# Patient Record
Sex: Female | Born: 1955 | Race: Black or African American | Hispanic: No | Marital: Single | Smoking: Former smoker
Health system: Southern US, Community
[De-identification: ages and names within clinical notes are randomized; demographics above are authoritative.]

## PROBLEM LIST (undated history)

## (undated) DIAGNOSIS — Z923 Personal history of irradiation: Secondary | ICD-10-CM

## (undated) DIAGNOSIS — H9190 Unspecified hearing loss, unspecified ear: Secondary | ICD-10-CM

## (undated) DIAGNOSIS — E785 Hyperlipidemia, unspecified: Secondary | ICD-10-CM

## (undated) DIAGNOSIS — IMO0001 Reserved for inherently not codable concepts without codable children: Secondary | ICD-10-CM

## (undated) DIAGNOSIS — M549 Dorsalgia, unspecified: Secondary | ICD-10-CM

## (undated) DIAGNOSIS — F329 Major depressive disorder, single episode, unspecified: Secondary | ICD-10-CM

## (undated) DIAGNOSIS — E119 Type 2 diabetes mellitus without complications: Secondary | ICD-10-CM

## (undated) DIAGNOSIS — F32A Depression, unspecified: Secondary | ICD-10-CM

## (undated) DIAGNOSIS — K219 Gastro-esophageal reflux disease without esophagitis: Secondary | ICD-10-CM

## (undated) DIAGNOSIS — M5136 Other intervertebral disc degeneration, lumbar region: Secondary | ICD-10-CM

## (undated) DIAGNOSIS — C259 Malignant neoplasm of pancreas, unspecified: Secondary | ICD-10-CM

## (undated) DIAGNOSIS — I639 Cerebral infarction, unspecified: Secondary | ICD-10-CM

## (undated) DIAGNOSIS — IMO0002 Reserved for concepts with insufficient information to code with codable children: Secondary | ICD-10-CM

## (undated) DIAGNOSIS — F419 Anxiety disorder, unspecified: Secondary | ICD-10-CM

## (undated) DIAGNOSIS — G8929 Other chronic pain: Secondary | ICD-10-CM

## (undated) DIAGNOSIS — M51369 Other intervertebral disc degeneration, lumbar region without mention of lumbar back pain or lower extremity pain: Secondary | ICD-10-CM

## (undated) DIAGNOSIS — I10 Essential (primary) hypertension: Secondary | ICD-10-CM

## (undated) DIAGNOSIS — D649 Anemia, unspecified: Secondary | ICD-10-CM

## (undated) DIAGNOSIS — K859 Acute pancreatitis without necrosis or infection, unspecified: Secondary | ICD-10-CM

## (undated) HISTORY — PX: TUBAL LIGATION: SHX77

## (undated) HISTORY — PX: BILE DUCT STENT PLACEMENT: SHX1227

## (undated) HISTORY — PX: APPENDECTOMY: SHX54

---

## 2013-02-05 ENCOUNTER — Encounter (HOSPITAL_COMMUNITY): Payer: Self-pay

## 2013-02-05 ENCOUNTER — Emergency Department (HOSPITAL_COMMUNITY)
Admission: EM | Admit: 2013-02-05 | Discharge: 2013-02-05 | Discharge status: Against Medical Advice | Payer: Self-pay | Attending: Emergency Medicine | Admitting: Emergency Medicine

## 2013-02-05 DIAGNOSIS — I10 Essential (primary) hypertension: Secondary | ICD-10-CM | POA: Insufficient documentation

## 2013-02-05 DIAGNOSIS — M6281 Muscle weakness (generalized): Secondary | ICD-10-CM | POA: Insufficient documentation

## 2013-02-05 DIAGNOSIS — F17200 Nicotine dependence, unspecified, uncomplicated: Secondary | ICD-10-CM | POA: Insufficient documentation

## 2013-02-05 DIAGNOSIS — E119 Type 2 diabetes mellitus without complications: Secondary | ICD-10-CM | POA: Insufficient documentation

## 2013-02-05 DIAGNOSIS — Z9181 History of falling: Secondary | ICD-10-CM | POA: Insufficient documentation

## 2013-02-05 HISTORY — DX: Essential (primary) hypertension: I10

## 2013-02-05 HISTORY — DX: Anxiety disorder, unspecified: F41.9

## 2013-02-05 HISTORY — DX: Depression, unspecified: F32.A

## 2013-02-05 HISTORY — DX: Major depressive disorder, single episode, unspecified: F32.9

## 2013-02-05 LAB — GLUCOSE, CAPILLARY: Glucose-Capillary: 188 mg/dL — ABNORMAL HIGH (ref 70–99)

## 2013-02-05 NOTE — ED Notes (Signed)
Called x 2 no answer

## 2013-02-05 NOTE — ED Notes (Signed)
Pt presents with CBG near 500 at 1300 today.  Pt also reports bilateral leg weakness "for a while" that she has had muscle relaxers prescribed to her that she thinks is making walking difficult.  Pt reports multiple falls.

## 2013-02-05 NOTE — ED Notes (Signed)
Patient's daughter came to the check in desk and stated that her mother [name] [name] has left.  She was tired of waiting.

## 2013-03-19 ENCOUNTER — Encounter (HOSPITAL_COMMUNITY): Payer: Self-pay | Admitting: *Deleted

## 2013-03-19 ENCOUNTER — Emergency Department (HOSPITAL_COMMUNITY): Payer: Medicaid Other

## 2013-03-19 ENCOUNTER — Emergency Department (HOSPITAL_COMMUNITY)
Admission: EM | Admit: 2013-03-19 | Discharge: 2013-03-19 | Disposition: A | Discharge status: Home / Self Care | Payer: Medicaid Other | Attending: Emergency Medicine | Admitting: Emergency Medicine

## 2013-03-19 DIAGNOSIS — R748 Abnormal levels of other serum enzymes: Secondary | ICD-10-CM | POA: Insufficient documentation

## 2013-03-19 DIAGNOSIS — M545 Low back pain, unspecified: Secondary | ICD-10-CM | POA: Insufficient documentation

## 2013-03-19 DIAGNOSIS — F329 Major depressive disorder, single episode, unspecified: Secondary | ICD-10-CM | POA: Insufficient documentation

## 2013-03-19 DIAGNOSIS — E119 Type 2 diabetes mellitus without complications: Secondary | ICD-10-CM | POA: Insufficient documentation

## 2013-03-19 DIAGNOSIS — F17200 Nicotine dependence, unspecified, uncomplicated: Secondary | ICD-10-CM | POA: Insufficient documentation

## 2013-03-19 DIAGNOSIS — I10 Essential (primary) hypertension: Secondary | ICD-10-CM | POA: Insufficient documentation

## 2013-03-19 DIAGNOSIS — K861 Other chronic pancreatitis: Secondary | ICD-10-CM | POA: Insufficient documentation

## 2013-03-19 DIAGNOSIS — F3289 Other specified depressive episodes: Secondary | ICD-10-CM | POA: Insufficient documentation

## 2013-03-19 DIAGNOSIS — F411 Generalized anxiety disorder: Secondary | ICD-10-CM | POA: Insufficient documentation

## 2013-03-19 DIAGNOSIS — Z79899 Other long term (current) drug therapy: Secondary | ICD-10-CM | POA: Insufficient documentation

## 2013-03-19 LAB — URINALYSIS, ROUTINE W REFLEX MICROSCOPIC
Glucose, UA: 250 mg/dL — AB
Hgb urine dipstick: NEGATIVE
Leukocytes, UA: NEGATIVE
Protein, ur: NEGATIVE mg/dL
Specific Gravity, Urine: 1.025 (ref 1.005–1.030)
Urobilinogen, UA: 0.2 mg/dL (ref 0.0–1.0)

## 2013-03-19 LAB — CBC WITH DIFFERENTIAL/PLATELET
Basophils Absolute: 0 10*3/uL (ref 0.0–0.1)
HCT: 37.6 % (ref 36.0–46.0)
Lymphocytes Relative: 32 % (ref 12–46)
Lymphs Abs: 3.1 10*3/uL (ref 0.7–4.0)
MCV: 83.4 fL (ref 78.0–100.0)
Neutro Abs: 5.6 10*3/uL (ref 1.7–7.7)
Platelets: 227 10*3/uL (ref 150–400)
RBC: 4.51 MIL/uL (ref 3.87–5.11)
RDW: 14.3 % (ref 11.5–15.5)
WBC: 9.9 10*3/uL (ref 4.0–10.5)

## 2013-03-19 LAB — COMPREHENSIVE METABOLIC PANEL
ALT: 17 U/L (ref 0–35)
AST: 21 U/L (ref 0–37)
Alkaline Phosphatase: 99 U/L (ref 39–117)
CO2: 27 mEq/L (ref 19–32)
Chloride: 101 mEq/L (ref 96–112)
GFR calc Af Amer: >90 mL/min (ref >90)
GFR calc non Af Amer: 89 mL/min — ABNORMAL LOW (ref >90)
Glucose, Bld: 131 mg/dL — ABNORMAL HIGH (ref 70–99)
Sodium: 138 mEq/L (ref 135–145)
Total Bilirubin: 0.2 mg/dL — ABNORMAL LOW (ref 0.3–1.2)

## 2013-03-19 MED ORDER — HYDROCODONE-ACETAMINOPHEN 5-325 MG PO TABS: 1.0000 | ORAL_TABLET | Freq: Four times a day (QID) | ORAL | Status: DC | PRN

## 2013-03-19 MED ORDER — OXYCODONE-ACETAMINOPHEN 5-325 MG PO TABS
2.0000 | ORAL_TABLET | Freq: Once | ORAL | Status: AC
  Administered 2013-03-19: 2 via ORAL
  Filled 2013-03-19: qty 2

## 2013-03-19 NOTE — ED Notes (Signed)
The pt is c/o abd and bi-lateral back pain for 5 years.  She just moved here from [place].  She reports that she  Has diabetes

## 2013-03-19 NOTE — ED Notes (Signed)
To x-ray

## 2013-03-19 NOTE — ED Provider Notes (Signed)
History     CSN: [id number]  Arrival date & time 03/19/13  0358   None     Chief Complaint  Patient presents with  . Abdominal Pain    (Consider location/radiation/quality/duration/timing/severity/associated sxs/prior treatment) HPI Comments: Patient is a 57 year old female with a history of diabetes and hypertension who presents for low back pain radiating down her posterior thighs. Patient states the pain is aching in nature and has been gradually worsening over the last 4 months. Patient admits to having improvement in the past with Vicodin, but states she ran out 2 months ago. Patient denies fevers, chest pain, shortness of breath, nausea and vomiting, urinary symptoms, saddle anesthesia, in bowel or bladder incontinence. Patient further denies new trauma, fall, or injury to her low back. Patient is ambulatory  Patient states she takes NovoLog with meals and Lantus at night before bed for her diabetes and has been doing so for the last "15 years". Patient also endorses a history of chronic pancreatitis and states that the abdominal pain she is experiencing is the same pain associated with her chronic pancreatitis. Patient states that her abdominal pain is not what brought her to the emergency department today.  Patient is a 57 y.o. female presenting with abdominal pain. The history is provided by the patient. No language interpreter was used.  Abdominal Pain Associated symptoms: no fever, no nausea and no vomiting     Past Medical History  Diagnosis Date  . Diabetes mellitus without complication   . Hypertension   . Depression   . Anxiety     Past Surgical History  Procedure Laterality Date  . Appendectomy      No family history on file.  History  Substance Use Topics  . Smoking status: Current Every Day Smoker -- 0.50 packs/day  . Smokeless tobacco: Not on file  . Alcohol Use: No    OB History   Grav Para Term Preterm Abortions TAB SAB Ect Mult Living                   Review of Systems  Constitutional: Negative for fever.  Gastrointestinal: Positive for abdominal pain. Negative for nausea and vomiting.  Musculoskeletal: Positive for myalgias and back pain.  Neurological: Negative for weakness.  All other systems reviewed and are negative.   Allergies  Betadine  Home Medications   Current Outpatient Rx  Name  Route  Sig  Dispense  Refill  . cyclobenzaprine (FLEXERIL) 10 MG tablet   Oral   Take 10 mg by mouth 3 (three) times daily as needed for muscle spasms.         Marland Kitchen gabapentin (NEURONTIN) 300 MG capsule   Oral   Take 300 mg by mouth 3 (three) times daily.         Marland Kitchen lisinopril-hydrochlorothiazide (PRINZIDE,ZESTORETIC) 20-25 MG per tablet   Oral   Take 1 tablet by mouth daily.         . potassium chloride (K-DUR,KLOR-CON) 10 MEQ tablet   Oral   Take 10 mEq by mouth daily.         . simvastatin (ZOCOR) 10 MG tablet   Oral   Take 10 mg by mouth at bedtime.         Marland Kitchen HYDROcodone-acetaminophen (NORCO/VICODIN) 5-325 MG per tablet   Oral   Take 1-2 tablets by mouth 4 (four) times daily as needed for pain (pt can take up to 2 tabs for pain).   30 tablet   0  BP 196/86  Pulse 83  Temp(Src) 98 F (36.7 C) (Oral)  Resp 16  SpO2 100%  Physical Exam  Nursing note and vitals reviewed. Constitutional: She is oriented to person, place, and time. She appears well-developed and well-nourished. No distress.  HENT:  Head: Normocephalic and atraumatic.  Mouth/Throat: Oropharynx is clear and moist. No oropharyngeal exudate.  Eyes: Conjunctivae and EOM are normal. Pupils are equal, round, and reactive to light. No scleral icterus.  Neck: Normal range of motion. Neck supple.  Cardiovascular: Normal rate, regular rhythm, normal heart sounds and intact distal pulses.   Distal radial, dorsalis pedis, and posterior tibial pulses 2+ bilaterally. Capillary refill normal in all extremities.  Pulmonary/Chest: Effort normal and  breath sounds normal. No respiratory distress. She has no wheezes. She has no rales.  Abdominal: Soft. Bowel sounds are normal. She exhibits no distension and no mass. There is no tenderness. There is no rebound and no guarding.  Musculoskeletal:       Right hip: Normal.       Left hip: Normal.       Cervical back: Normal.       Thoracic back: Normal.       Lumbar back: She exhibits tenderness and bony tenderness. She exhibits no swelling, no deformity, no pain, no spasm and normal pulse.  TTP of lumbar midline and paraspinals without palpable deformities or step offs. No cervical or thoracic midline tenderness.   Neurological: She is alert and oriented to person, place, and time. She has normal reflexes.  No sensory or motor deficits appreciated. DTRs normal and symmetric.  Skin: Skin is warm and dry. No rash noted. She is not diaphoretic. No erythema.  Psychiatric: She has a normal mood and affect. Her behavior is normal.    ED Course  Procedures (including critical care time)  Labs Reviewed  URINALYSIS, ROUTINE W REFLEX MICROSCOPIC - Abnormal; Notable for the following:    Glucose, UA 250 (*)    All other components within normal limits  COMPREHENSIVE METABOLIC PANEL - Abnormal; Notable for the following:    Potassium 3.4 (*)    Glucose, Bld 131 (*)    Total Bilirubin 0.2 (*)    GFR calc non Af Amer 89 (*)    All other components within normal limits  LIPASE, BLOOD - Abnormal; Notable for the following:    Lipase 112 (*)    All other components within normal limits  CBC WITH DIFFERENTIAL   Dg Lumbar Spine Complete  03/19/2013  *RADIOLOGY REPORT*  Clinical Data: Worsening back pain for months.  LUMBAR SPINE - COMPLETE 4+ VIEW  Comparison: None.  Findings: Five lumbar type vertebral bodies.  Slight anterior subluxation of L4 on L5.  Normal alignment of the facet joints. Degenerative changes throughout the lumbar spine with narrowed lumbar interspaces and endplate hypertrophic  changes.  Degenerative changes in the lumbar facet joints.  No vertebral compression deformities.  No focal bone lesion or bone destruction.  Bone cortex and trabecular architecture appear intact.  Vascular calcifications.  Calcification in the pelvis suggesting a fibroid.  IMPRESSION: Slight anterior subluxation of L4-5 is likely due to degenerative change.  Diffuse degenerative changes throughout the lumbar spine. No displaced fractures identified.   Original Report Authenticated By: Burman Nieves, M.D.      1. Low back pain   2. Elevated lipase     MDM  Uncomplicated low back pain x 4 months in patient with hx of DM and HTN. Patient denies new  injury or back trauma and endorses improvement of symptoms with Vicodin. On exam, patient exhibits TTP of lumbar midline and paraspinal muscles; neurovascularly intact, heart RRR, lungs CTAB, and abdomen NTTP. W/u significant for elevated lipase without correlating physical exam findings to suspect acute pancreatitis. DG lumbar spine with evidence of very slight anterior subluxation of L4-5 likely 2/2 degenerative changes. No symptoms to suspect cauda equina.  Patient endorses improvement in back pain symptoms with Percocet. Patient is well, nontoxic appearing, and hemodynamically stable. Appropriate for d/c with orthopedic and PCP follow up for further evaluation of symptoms. Indications for ED return discussed. Patient states comfort and understanding with this d/c plan with no unaddressed concerns. Patient work up and management discussed with Dr. Estell Harpin who is in agreement.        Antony Madura, PA-C 03/24/13 2315

## 2013-03-26 NOTE — ED Provider Notes (Signed)
Medical screening examination/treatment/procedure(s) were performed by non-physician practitioner and as supervising physician I was immediately available for consultation/collaboration.   Benny Lennert, MD 03/26/13 [phone]

## 2013-05-18 DIAGNOSIS — C259 Malignant neoplasm of pancreas, unspecified: Secondary | ICD-10-CM

## 2013-05-18 HISTORY — DX: Malignant neoplasm of pancreas, unspecified: C25.9

## 2013-05-22 ENCOUNTER — Emergency Department (HOSPITAL_COMMUNITY): Payer: Medicaid Other

## 2013-05-22 ENCOUNTER — Inpatient Hospital Stay (HOSPITAL_COMMUNITY)
Admission: EM | Admit: 2013-05-22 | Discharge: 2013-05-25 | DRG: 436 | Disposition: A | Discharge status: Home / Self Care | Payer: Medicaid Other | Attending: Internal Medicine | Admitting: Internal Medicine

## 2013-05-22 ENCOUNTER — Inpatient Hospital Stay (HOSPITAL_COMMUNITY): Payer: Medicaid Other

## 2013-05-22 ENCOUNTER — Encounter (HOSPITAL_COMMUNITY): Payer: Self-pay | Admitting: *Deleted

## 2013-05-22 DIAGNOSIS — Z8673 Personal history of transient ischemic attack (TIA), and cerebral infarction without residual deficits: Secondary | ICD-10-CM

## 2013-05-22 DIAGNOSIS — K838 Other specified diseases of biliary tract: Secondary | ICD-10-CM | POA: Diagnosis present

## 2013-05-22 DIAGNOSIS — I10 Essential (primary) hypertension: Secondary | ICD-10-CM | POA: Diagnosis present

## 2013-05-22 DIAGNOSIS — Z79899 Other long term (current) drug therapy: Secondary | ICD-10-CM

## 2013-05-22 DIAGNOSIS — E785 Hyperlipidemia, unspecified: Secondary | ICD-10-CM | POA: Diagnosis present

## 2013-05-22 DIAGNOSIS — F17200 Nicotine dependence, unspecified, uncomplicated: Secondary | ICD-10-CM | POA: Diagnosis present

## 2013-05-22 DIAGNOSIS — K8689 Other specified diseases of pancreas: Secondary | ICD-10-CM

## 2013-05-22 DIAGNOSIS — E119 Type 2 diabetes mellitus without complications: Secondary | ICD-10-CM | POA: Diagnosis present

## 2013-05-22 DIAGNOSIS — F1020 Alcohol dependence, uncomplicated: Secondary | ICD-10-CM | POA: Diagnosis present

## 2013-05-22 DIAGNOSIS — K831 Obstruction of bile duct: Secondary | ICD-10-CM | POA: Diagnosis present

## 2013-05-22 DIAGNOSIS — F3289 Other specified depressive episodes: Secondary | ICD-10-CM | POA: Diagnosis present

## 2013-05-22 DIAGNOSIS — K861 Other chronic pancreatitis: Secondary | ICD-10-CM | POA: Diagnosis present

## 2013-05-22 DIAGNOSIS — K219 Gastro-esophageal reflux disease without esophagitis: Secondary | ICD-10-CM | POA: Diagnosis present

## 2013-05-22 DIAGNOSIS — R17 Unspecified jaundice: Secondary | ICD-10-CM

## 2013-05-22 DIAGNOSIS — C250 Malignant neoplasm of head of pancreas: Principal | ICD-10-CM | POA: Diagnosis present

## 2013-05-22 DIAGNOSIS — F329 Major depressive disorder, single episode, unspecified: Secondary | ICD-10-CM | POA: Diagnosis present

## 2013-05-22 DIAGNOSIS — F411 Generalized anxiety disorder: Secondary | ICD-10-CM | POA: Diagnosis present

## 2013-05-22 DIAGNOSIS — C259 Malignant neoplasm of pancreas, unspecified: Secondary | ICD-10-CM

## 2013-05-22 DIAGNOSIS — Z794 Long term (current) use of insulin: Secondary | ICD-10-CM

## 2013-05-22 DIAGNOSIS — R1013 Epigastric pain: Secondary | ICD-10-CM

## 2013-05-22 HISTORY — DX: Gastro-esophageal reflux disease without esophagitis: K21.9

## 2013-05-22 HISTORY — DX: Hyperlipidemia, unspecified: E78.5

## 2013-05-22 HISTORY — DX: Acute pancreatitis without necrosis or infection, unspecified: K85.90

## 2013-05-22 LAB — CBC
Hemoglobin: 12.2 g/dL (ref 12.0–15.0)
MCH: 28.6 pg (ref 26.0–34.0)
MCHC: 34 g/dL (ref 30.0–36.0)
MCV: 84.1 fL (ref 78.0–100.0)
RBC: 4.27 MIL/uL (ref 3.87–5.11)

## 2013-05-22 LAB — URINALYSIS, ROUTINE W REFLEX MICROSCOPIC
Hgb urine dipstick: NEGATIVE
Nitrite: NEGATIVE
Specific Gravity, Urine: 1.022 (ref 1.005–1.030)
Urobilinogen, UA: 2 mg/dL — ABNORMAL HIGH (ref 0.0–1.0)

## 2013-05-22 LAB — HEPATIC FUNCTION PANEL
Albumin: 3.7 g/dL (ref 3.5–5.2)
Alkaline Phosphatase: 391 U/L — ABNORMAL HIGH (ref 39–117)
Indirect Bilirubin: 1.3 mg/dL — ABNORMAL HIGH (ref 0.3–0.9)
Total Protein: 7.5 g/dL (ref 6.0–8.3)

## 2013-05-22 LAB — CBC WITH DIFFERENTIAL/PLATELET
Basophils Absolute: 0 10*3/uL (ref 0.0–0.1)
Basophils Relative: 0 % (ref 0–1)
Eosinophils Absolute: 0.2 10*3/uL (ref 0.0–0.7)
Eosinophils Relative: 3 % (ref 0–5)
Lymphs Abs: 2.2 10*3/uL (ref 0.7–4.0)
MCH: 29.2 pg (ref 26.0–34.0)
MCHC: 34.9 g/dL (ref 30.0–36.0)
MCV: 83.6 fL (ref 78.0–100.0)
Platelets: 192 10*3/uL (ref 150–400)
RDW: 14.5 % (ref 11.5–15.5)

## 2013-05-22 LAB — COMPREHENSIVE METABOLIC PANEL
ALT: 409 U/L — ABNORMAL HIGH (ref 0–35)
Albumin: 4.2 g/dL (ref 3.5–5.2)
Calcium: 9.8 mg/dL (ref 8.4–10.5)
GFR calc Af Amer: 89 mL/min — ABNORMAL LOW (ref >90)
Glucose, Bld: 154 mg/dL — ABNORMAL HIGH (ref 70–99)
Sodium: 137 mEq/L (ref 135–145)
Total Protein: 8.3 g/dL (ref 6.0–8.3)

## 2013-05-22 LAB — POCT I-STAT TROPONIN I: Troponin i, poc: 0 ng/mL (ref 0.00–0.08)

## 2013-05-22 LAB — PROTIME-INR: Prothrombin Time: 12.7 seconds (ref 11.6–15.2)

## 2013-05-22 LAB — GLUCOSE, CAPILLARY
Glucose-Capillary: 110 mg/dL — ABNORMAL HIGH (ref 70–99)
Glucose-Capillary: 100 mg/dL — ABNORMAL HIGH (ref 70–99)
Glucose-Capillary: 239 mg/dL — ABNORMAL HIGH (ref 70–99)

## 2013-05-22 MED ORDER — MORPHINE SULFATE 2 MG/ML IJ SOLN
2.0000 mg | INTRAMUSCULAR | Status: DC | PRN
  Administered 2013-05-22 – 2013-05-24 (×3): 2 mg via INTRAVENOUS
  Filled 2013-05-22 (×3): qty 1

## 2013-05-22 MED ORDER — INSULIN ASPART 100 UNIT/ML ~~LOC~~ SOLN
0.0000 [IU] | Freq: Three times a day (TID) | SUBCUTANEOUS | Status: DC
  Administered 2013-05-23: 1 [IU] via SUBCUTANEOUS
  Administered 2013-05-24: 2 [IU] via SUBCUTANEOUS
  Administered 2013-05-24: 3 [IU] via SUBCUTANEOUS
  Administered 2013-05-25 (×2): 1 [IU] via SUBCUTANEOUS

## 2013-05-22 MED ORDER — ONDANSETRON HCL 4 MG/2ML IJ SOLN: 4.0000 mg | Freq: Four times a day (QID) | INTRAMUSCULAR | Status: DC | PRN

## 2013-05-22 MED ORDER — HYDROMORPHONE HCL PF 1 MG/ML IJ SOLN
1.0000 mg | Freq: Once | INTRAMUSCULAR | Status: AC
  Administered 2013-05-22: 1 mg via INTRAVENOUS
  Filled 2013-05-22: qty 1

## 2013-05-22 MED ORDER — GADOBENATE DIMEGLUMINE 529 MG/ML IV SOLN: 15.0000 mL | Freq: Once | INTRAVENOUS | Status: AC | PRN

## 2013-05-22 MED ORDER — POTASSIUM CHLORIDE CRYS ER 20 MEQ PO TBCR
40.0000 meq | EXTENDED_RELEASE_TABLET | Freq: Once | ORAL | Status: AC
  Administered 2013-05-22: 40 meq via ORAL
  Filled 2013-05-22: qty 2

## 2013-05-22 MED ORDER — ALUM & MAG HYDROXIDE-SIMETH 200-200-20 MG/5ML PO SUSP
30.0000 mL | Freq: Four times a day (QID) | ORAL | Status: DC | PRN
  Administered 2013-05-23: 30 mL via ORAL
  Filled 2013-05-22: qty 30

## 2013-05-22 MED ORDER — SODIUM CHLORIDE 0.9 % IV SOLN
INTRAVENOUS | Status: DC
  Administered 2013-05-22 – 2013-05-24 (×4): via INTRAVENOUS

## 2013-05-22 MED ORDER — SODIUM CHLORIDE 0.9 % IV BOLUS (SEPSIS)
1000.0000 mL | Freq: Once | INTRAVENOUS | Status: AC
  Administered 2013-05-22: 1000 mL via INTRAVENOUS

## 2013-05-22 MED ORDER — ACETAMINOPHEN 650 MG RE SUPP: 650.0000 mg | Freq: Four times a day (QID) | RECTAL | Status: DC | PRN

## 2013-05-22 MED ORDER — ONDANSETRON HCL 4 MG/2ML IJ SOLN
4.0000 mg | Freq: Once | INTRAMUSCULAR | Status: AC
  Administered 2013-05-22: 4 mg via INTRAVENOUS
  Filled 2013-05-22: qty 2

## 2013-05-22 MED ORDER — ACETAMINOPHEN 325 MG PO TABS: 650.0000 mg | ORAL_TABLET | Freq: Four times a day (QID) | ORAL | Status: DC | PRN

## 2013-05-22 MED ORDER — GI COCKTAIL ~~LOC~~
30.0000 mL | Freq: Once | ORAL | Status: AC
  Administered 2013-05-22: 30 mL via ORAL
  Filled 2013-05-22: qty 30

## 2013-05-22 MED ORDER — INSULIN ASPART 100 UNIT/ML ~~LOC~~ SOLN
10.0000 [IU] | Freq: Three times a day (TID) | SUBCUTANEOUS | Status: DC
  Administered 2013-05-23 – 2013-05-25 (×6): 10 [IU] via SUBCUTANEOUS

## 2013-05-22 MED ORDER — GABAPENTIN 300 MG PO CAPS
300.0000 mg | ORAL_CAPSULE | Freq: Three times a day (TID) | ORAL | Status: DC
  Administered 2013-05-22 – 2013-05-25 (×8): 300 mg via ORAL
  Filled 2013-05-22 (×11): qty 1

## 2013-05-22 MED ORDER — GADOBENATE DIMEGLUMINE 529 MG/ML IV SOLN
15.0000 mL | Freq: Once | INTRAVENOUS | Status: AC | PRN
  Administered 2013-05-22: 15 mL via INTRAVENOUS

## 2013-05-22 MED ORDER — SODIUM CHLORIDE 0.9 % IV SOLN: INTRAVENOUS | Status: DC

## 2013-05-22 MED ORDER — LORAZEPAM 2 MG/ML IJ SOLN: 1.0000 mg | Freq: Once | INTRAMUSCULAR | Status: AC

## 2013-05-22 MED ORDER — ENOXAPARIN SODIUM 40 MG/0.4ML ~~LOC~~ SOLN
40.0000 mg | SUBCUTANEOUS | Status: DC
  Administered 2013-05-22 – 2013-05-23 (×2): 40 mg via SUBCUTANEOUS
  Filled 2013-05-22 (×4): qty 0.4

## 2013-05-22 MED ORDER — ALBUTEROL SULFATE (5 MG/ML) 0.5% IN NEBU: 2.5000 mg | INHALATION_SOLUTION | RESPIRATORY_TRACT | Status: DC | PRN

## 2013-05-22 MED ORDER — OXYCODONE HCL 5 MG PO TABS: 5.0000 mg | ORAL_TABLET | ORAL | Status: DC | PRN

## 2013-05-22 MED ORDER — INSULIN GLARGINE 100 UNIT/ML ~~LOC~~ SOLN
20.0000 [IU] | Freq: Every day | SUBCUTANEOUS | Status: DC
  Administered 2013-05-22 – 2013-05-24 (×3): 20 [IU] via SUBCUTANEOUS
  Filled 2013-05-22 (×4): qty 0.2

## 2013-05-22 MED ORDER — ONDANSETRON HCL 4 MG PO TABS: 4.0000 mg | ORAL_TABLET | Freq: Four times a day (QID) | ORAL | Status: DC | PRN

## 2013-05-22 MED ORDER — LISINOPRIL 20 MG PO TABS
20.0000 mg | ORAL_TABLET | Freq: Every day | ORAL | Status: DC
  Administered 2013-05-22 – 2013-05-25 (×4): 20 mg via ORAL
  Filled 2013-05-22 (×4): qty 1

## 2013-05-22 MED ORDER — HYDROCHLOROTHIAZIDE 25 MG PO TABS
25.0000 mg | ORAL_TABLET | Freq: Every day | ORAL | Status: DC
  Administered 2013-05-22 – 2013-05-25 (×4): 25 mg via ORAL
  Filled 2013-05-22 (×4): qty 1

## 2013-05-22 MED ORDER — LISINOPRIL-HYDROCHLOROTHIAZIDE 20-25 MG PO TABS: 1.0000 | ORAL_TABLET | Freq: Every day | ORAL | Status: DC

## 2013-05-22 MED ORDER — LORAZEPAM 2 MG/ML IJ SOLN
INTRAMUSCULAR | Status: AC
  Administered 2013-05-22: 1 mg via INTRAVENOUS
  Filled 2013-05-22: qty 1

## 2013-05-22 NOTE — H&P (Signed)
PATIENT DETAILS Name: [name] [name] Age: 57 y.o. Sex: female Date of Birth: 1956/02/24 Admit Date: 05/22/2013 ZOX:[id number] A, MD   CHIEF COMPLAINT:  Darkish Discoloration of the urine for the past 3 days  HPI: [name] [name] is a 57 y.o. female with a Past Medical History of chronic alcoholic pancreatitis, diabetes, hypertension, prior history of CVA who presents today with the above noted complaint. The patient she has had chronic abdominal pain for the past number of years, she claims that she had several flares up pancreatitis over the past few years. She attributes her chronic abdominal pain to chronic pancreatitis. However over the past 3 days, she's had slight worsening of chronic abdominal pain, she's also had a few episodes of vomiting. The vomitus is nonbloody. Approximately 3 days ago as well, she noticed a discoloration of her urine- "was dark". She then proceeded to come to the emergency room today, she was found to have obstructive jaundice and transaminitis, the ultrasound of the abdomen showed a possible pancreatic head malignancy. I was subsequently asked to admit this patient for further evaluation and treatment. There is no history of headache or fever. There is a history of chest pain or shortness of breath. There is no history of diarrhea. She claims her stools are light in color. She's not sure if she's lost weight.  ALLERGIES:   Allergies  Allergen Reactions  . Betadine (Povidone Iodine) Rash    PAST MEDICAL HISTORY: Past Medical History  Diagnosis Date  . Diabetes mellitus without complication   . Hypertension   . Depression   . Anxiety   . Hyperlipidemia   . Pancreatitis   . GERD (gastroesophageal reflux disease)     PAST SURGICAL HISTORY: Past Surgical History  Procedure Laterality Date  . Appendectomy      MEDICATIONS AT HOME: Prior to Admission medications   Medication Sig Start Date End Date Taking? Authorizing Provider  gabapentin (NEURONTIN) 300  MG capsule Take 300 mg by mouth 3 (three) times daily.   Yes Historical Provider, MD  HYDROcodone-acetaminophen (NORCO/VICODIN) 5-325 MG per tablet Take 1-2 tablets by mouth 4 (four) times daily as needed for pain (pt can take up to 2 tabs for pain). 03/19/13  Yes Antony Madura, PA-C  insulin aspart (NOVOLOG) 100 UNIT/ML injection Inject 10 Units into the skin 3 (three) times daily with meals.   Yes Historical Provider, MD  insulin glargine (LANTUS) 100 UNIT/ML injection Inject 20 Units into the skin at bedtime.   Yes Historical Provider, MD  lisinopril-hydrochlorothiazide (PRINZIDE,ZESTORETIC) 20-25 MG per tablet Take 1 tablet by mouth daily.   Yes Historical Provider, MD  potassium chloride (K-DUR,KLOR-CON) 10 MEQ tablet Take 10 mEq by mouth daily.   Yes Historical Provider, MD  simvastatin (ZOCOR) 10 MG tablet Take 10 mg by mouth at bedtime.   Yes Historical Provider, MD    FAMILY HISTORY: Mother had MI  SOCIAL HISTORY:  reports that she has been smoking.  She does not have any smokeless tobacco history on file. She reports that she does not drink alcohol or use illicit drugs. she claims that she used to drink much heavily before, last drink was approximately a year ago  REVIEW OF SYSTEMS:  Constitutional:   No  weight loss, night sweats,  Fevers, chills, fatigue.  HEENT:    No headaches, Difficulty swallowing,Tooth/dental problems,Sore throat,  No sneezing, itching, ear ache, nasal congestion, post nasal drip,   Cardio-vascular: No chest pain,  Orthopnea, PND, swelling in lower extremities, anasarca,  dizziness, palpitations  GI:  No heartburn, indigestion,  diarrhea, change in  bowel habits, loss of appetite  Resp: No shortness of breath with exertion or at rest.  No excess mucus, no productive cough, No non-productive cough,  No coughing up of blood.No change in color of mucus.No wheezing.No chest wall deformity  Skin:  no rash or lesions.  GU:  no dysuria, change in color of  urine, no urgency or frequency.  No flank pain.  Musculoskeletal: No joint pain or swelling.  No decreased range of motion.  No back pain.  Psych: No change in mood or affect. No depression or anxiety.  No memory loss.   PHYSICAL EXAM: Blood pressure 179/86, pulse 65, temperature 98.8 F (37.1 C), temperature source Oral, resp. rate 14, height 5\' 3"  (1.6 m), weight 71.668 kg (158 lb), SpO2 98.00%.  General appearance :Awake, alert, not in any distress. Speech Clear. Not toxic Looking. Slight yellowish tinge to her sclera HEENT: Atraumatic and Normocephalic, pupils equally reactive to light and accomodation Neck: supple, no JVD. No cervical lymphadenopathy.  Chest:Good air entry bilaterally, no added sounds  CVS: S1 S2 regular, no murmurs.  Abdomen: Bowel sounds present, Non tender and not distended with no gaurding, rigidity or rebound. Extremities: B/L Lower Ext shows no edema, both legs are warm to touch Neurology: Awake alert, and oriented X 3, CN II-XII intact, Non focal Skin:No Rash Wounds:N/A  LABS ON ADMISSION:   Recent Labs  05/22/13 0745  NA 137  K 3.1*  CL 99  CO2 27  GLUCOSE 154*  BUN 15  CREATININE 0.82  CALCIUM 9.8    Recent Labs  05/22/13 0745  AST 228*  ALT 409*  ALKPHOS 425*  BILITOT 5.4*  PROT 8.3  ALBUMIN 4.2    Recent Labs  05/22/13 0745  LIPASE 88*    Recent Labs  05/22/13 0745  WBC 7.6  NEUTROABS 4.6  HGB 13.5  HCT 38.7  MCV 83.6  PLT 192   No results found for this basename: CKTOTAL, CKMB, CKMBINDEX, TROPONINI,  in the last 72 hours No results found for this basename: DDIMER,  in the last 72 hours No components found with this basename: POCBNP,    RADIOLOGIC STUDIES ON ADMISSION: US Abdomen Complete  05/22/2013   *RADIOLOGY REPORT*  Clinical Data:  Abdominal pain.  Elevated liver function tests. Elevated bilirubin.  COMPLETE ABDOMINAL ULTRASOUND  Comparison:  05/22/2013 radiographs  Findings:  Gallbladder:  Gallbladder wall  thickening up to 5 mm.  Sonographic Murphy's sign absent.  Gallbladder sludge noted.  Common bile duct:  Abnormally prominent, measuring 1.2 cm in diameter, without directly visualized choledocholithiasis.  Liver:  Intrahepatic bile duct dilatation noted without focal hepatic lesion identified.  IVC:  Appears normal.  Pancreas:  Heterogeneous pancreatic head and body.  Possible pancreatic head mass.  This measures 4.1 x 4.1 x 3.0 cm.  Spleen:  Unremarkable, 5.0 cm craniocaudad.  Right Kidney:  Measures 11.2 cm in length and appears normal.  Left Kidney:  Measures 10.8 cm in length and contains a 1.8 cm lower pole cyst as well as a 8 mm linear echogenic structure along the renal pelvis which could represent a linear renal calculus.  Abdominal aorta:  No aneurysm identified.  Aortic bifurcation obscured by overlying bowel gas.  IMPRESSION:  1.  Abnormal intrahepatic bile duct dilatation with dilated common bile duct and heterogeneous pancreatic head suspicious for possible pancreatic mass, although localized pancreatitis could possibly have a similar sonographic appearance.  MRCP with and without contrast is recommended for further characterization.  If this is not feasible, consider a pancreatic protocol CT scan with and without contrast to assess for malignancy. 2.  Thickened gallbladder wall without pericholecystic fluid or sonographic Murphy's sign.  Correlate clinically in assessing for cholecystitis.  There is sludge in the gallbladder. 3.  Possible small linear left kidney lower pole calculus.   Original Report Authenticated By: Gaylyn Rong, M.D.   Dg Abd Acute W/chest  05/22/2013   *RADIOLOGY REPORT*  Clinical Data: Abdominal pain and back pain.  ACUTE ABDOMEN SERIES (ABDOMEN 2 VIEW & CHEST 1 VIEW)  Comparison: Lumbar radiographs dated 03/19/2013  Findings: There is borderline cardiomegaly.  Pulmonary vascularity is normal and the lungs are clear.  No free air or free fluid in the abdomen.  Normal  bowel gas pattern.  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  No acute osseous abnormality.  IMPRESSION: No acute abnormalities.   Original Report Authenticated By: Francene Boyers, M.D.   ASSESSMENT AND PLAN: Present on Admission:  . Obstructive jaundice - Admit to a regular medical bed, we'll get a MRI of the abdomen. Get dedicated liver function tests and coags. Depending on the MRI abdomen results, we will consult gastroenterology.  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 DM (diabetes mellitus) - Continue home dosing of Lantus and NovoLog. Would also place on sliding scale insulin. Further adjustment will depend on CBG readings.   Marland Kitchen HTN (hypertension) - Continue lisinopril and hydrochlorothiazide. Potassium is low today, will give one extra dose of 40 mEq of potassium.  - Recheck electrolytes in a.m.   Marland Kitchen Dyslipidemia - Given significantly elevated LFTs, hold the statins.   . Chronic pancreatitis - As needed narcotics and antiemetics. Await MRI of the abdomen   Further plan will depend as patient's clinical course evolves and further radiologic and laboratory data become available. Patient will be monitored closely.   DVT Prophylaxis: Prophylactic Lovenox   Code Status: Full Code  Total time spent for admission equals 45 minutes.  [facility] Pager [phone]  If 7PM-7AM, please contact night-coverage [email or web address] Password [email or web address] 05/22/2013, 1:50 PM

## 2013-05-22 NOTE — Progress Notes (Signed)
Pt educated on sliding scale. Administered insulin herself.

## 2013-05-22 NOTE — ED Notes (Signed)
Patient presents with numerous complaints.  C/o abd pain (history of pancreatitis), also c/o pain to her lower back with bloody urine, epigastric pain.

## 2013-05-22 NOTE — ED Provider Notes (Signed)
History    CSN: [id number] Arrival date & time 05/22/13  6578  First MD Initiated Contact with Patient 05/22/13 [phone]     Chief Complaint  Patient presents with  . Abdominal Pain   (Consider location/radiation/quality/duration/timing/severity/associated sxs/prior Treatment) Patient is a 57 y.o. female presenting with abdominal pain. The history is provided by the patient and a relative.  Abdominal Pain This is a chronic problem. The current episode started in the past 7 days. The problem occurs constantly. The problem has been gradually worsening. Associated symptoms include abdominal pain, nausea and vomiting (x5; no blood). Pertinent negatives include no chest pain, chills, coughing, fever or urinary symptoms (pt reports urine is "orange colored"). The symptoms are aggravated by eating. She has tried oral narcotics for the symptoms. The treatment provided no relief.   Past Medical History  Diagnosis Date  . Diabetes mellitus without complication   . Hypertension   . Depression   . Anxiety   . Hyperlipidemia   . Pancreatitis   . GERD (gastroesophageal reflux disease)    Past Surgical History  Procedure Laterality Date  . Appendectomy     History reviewed. No pertinent family history. History  Substance Use Topics  . Smoking status: Current Every Day Smoker -- 0.50 packs/day  . Smokeless tobacco: Not on file  . Alcohol Use: No   OB History   Grav Para Term Preterm Abortions TAB SAB Ect Mult Living                 Review of Systems  Constitutional: Negative for fever and chills.  Respiratory: Negative for cough and shortness of breath.   Cardiovascular: Negative for chest pain.  Gastrointestinal: Positive for nausea, vomiting (x5; no blood) and abdominal pain. Negative for diarrhea, constipation and blood in stool.  Genitourinary: Negative for dysuria, frequency and flank pain.  Musculoskeletal: Positive for back pain (lower back).  All other systems reviewed and are  negative.    Allergies  Betadine  Home Medications   Current Outpatient Rx  Name  Route  Sig  Dispense  Refill  . gabapentin (NEURONTIN) 300 MG capsule   Oral   Take 300 mg by mouth 3 (three) times daily.         Marland Kitchen HYDROcodone-acetaminophen (NORCO/VICODIN) 5-325 MG per tablet   Oral   Take 1-2 tablets by mouth 4 (four) times daily as needed for pain (pt can take up to 2 tabs for pain).   30 tablet   0   . insulin aspart (NOVOLOG) 100 UNIT/ML injection   Subcutaneous   Inject 10 Units into the skin 3 (three) times daily with meals.         . insulin glargine (LANTUS) 100 UNIT/ML injection   Subcutaneous   Inject 20 Units into the skin at bedtime.         Marland Kitchen lisinopril-hydrochlorothiazide (PRINZIDE,ZESTORETIC) 20-25 MG per tablet   Oral   Take 1 tablet by mouth daily.         . potassium chloride (K-DUR,KLOR-CON) 10 MEQ tablet   Oral   Take 10 mEq by mouth daily.         . simvastatin (ZOCOR) 10 MG tablet   Oral   Take 10 mg by mouth at bedtime.          BP 179/86  Pulse 65  Temp(Src) 98.8 F (37.1 C) (Oral)  Resp 14  Ht 5\' 3"  (1.6 m)  Wt 158 lb (71.668 kg)  BMI 28 kg/m2  SpO2 98% Physical Exam  Constitutional: She is oriented to person, place, and time. Vital signs are normal. She appears well-developed and well-nourished. She appears distressed.  HENT:  Head: Normocephalic and atraumatic.  Eyes: EOM are normal. Pupils are equal, round, and reactive to light. Scleral icterus is present.  Neck: Neck supple.  Cardiovascular: Normal rate, regular rhythm, normal heart sounds and intact distal pulses.  Exam reveals no gallop and no friction rub.   No murmur heard. Pulmonary/Chest: Effort normal and breath sounds normal. No respiratory distress.  Abdominal: Soft. Normal appearance and bowel sounds are normal. She exhibits no distension. There is generalized tenderness (throughout). There is no rigidity, no rebound, no guarding, no CVA tenderness and  negative Murphy's sign.  Neurological: She is alert and oriented to person, place, and time.  Skin: Skin is warm and dry.    ED Course  Procedures (including critical care time)   Date: 05/22/2013  Rate: 77  Rhythm: normal sinus rhythm  QRS Axis: left  Intervals: normal  ST/T Wave abnormalities: normal and nonspecific ST changes  Conduction Disutrbances:none  Narrative Interpretation:   Old EKG Reviewed: none available  Labs Reviewed  COMPREHENSIVE METABOLIC PANEL - Abnormal; Notable for the following:    Potassium 3.1 (*)    Glucose, Bld 154 (*)    AST 228 (*)    ALT 409 (*)    Alkaline Phosphatase 425 (*)    Total Bilirubin 5.4 (*)    GFR calc non Af Amer 77 (*)    GFR calc Af Amer 89 (*)    All other components within normal limits  LIPASE, BLOOD - Abnormal; Notable for the following:    Lipase 88 (*)    All other components within normal limits  URINALYSIS, ROUTINE W REFLEX MICROSCOPIC - Abnormal; Notable for the following:    Color, Urine ORANGE (*)    APPearance CLOUDY (*)    Bilirubin Urine LARGE (*)    Ketones, ur 15 (*)    Urobilinogen, UA 2.0 (*)    Leukocytes, UA SMALL (*)    All other components within normal limits  URINE MICROSCOPIC-ADD ON - Abnormal; Notable for the following:    Squamous Epithelial / LPF MANY (*)    All other components within normal limits  GLUCOSE, CAPILLARY - Abnormal; Notable for the following:    Glucose-Capillary 110 (*)    All other components within normal limits  CBC WITH DIFFERENTIAL  HEPATITIS PANEL, ACUTE  HEPATIC FUNCTION PANEL  PROTIME-INR  POCT I-STAT TROPONIN I   US Abdomen Complete  05/22/2013   *RADIOLOGY REPORT*  Clinical Data:  Abdominal pain.  Elevated liver function tests. Elevated bilirubin.  COMPLETE ABDOMINAL ULTRASOUND  Comparison:  05/22/2013 radiographs  Findings:  Gallbladder:  Gallbladder wall thickening up to 5 mm.  Sonographic Murphy's sign absent.  Gallbladder sludge noted.  Common bile duct:   Abnormally prominent, measuring 1.2 cm in diameter, without directly visualized choledocholithiasis.  Liver:  Intrahepatic bile duct dilatation noted without focal hepatic lesion identified.  IVC:  Appears normal.  Pancreas:  Heterogeneous pancreatic head and body.  Possible pancreatic head mass.  This measures 4.1 x 4.1 x 3.0 cm.  Spleen:  Unremarkable, 5.0 cm craniocaudad.  Right Kidney:  Measures 11.2 cm in length and appears normal.  Left Kidney:  Measures 10.8 cm in length and contains a 1.8 cm lower pole cyst as well as a 8 mm linear echogenic structure along the renal pelvis which could represent a linear renal  calculus.  Abdominal aorta:  No aneurysm identified.  Aortic bifurcation obscured by overlying bowel gas.  IMPRESSION:  1.  Abnormal intrahepatic bile duct dilatation with dilated common bile duct and heterogeneous pancreatic head suspicious for possible pancreatic mass, although localized pancreatitis could possibly have a similar sonographic appearance.  MRCP with and without contrast is recommended for further characterization.  If this is not feasible, consider a pancreatic protocol CT scan with and without contrast to assess for malignancy. 2.  Thickened gallbladder wall without pericholecystic fluid or sonographic Murphy's sign.  Correlate clinically in assessing for cholecystitis.  There is sludge in the gallbladder. 3.  Possible small linear left kidney lower pole calculus.   Original Report Authenticated By: Gaylyn Rong, M.D.   Dg Abd Acute W/chest  05/22/2013   *RADIOLOGY REPORT*  Clinical Data: Abdominal pain and back pain.  ACUTE ABDOMEN SERIES (ABDOMEN 2 VIEW & CHEST 1 VIEW)  Comparison: Lumbar radiographs dated 03/19/2013  Findings: There is borderline cardiomegaly.  Pulmonary vascularity is normal and the lungs are clear.  No free air or free fluid in the abdomen.  Normal bowel gas pattern.  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  No acute osseous abnormality.  IMPRESSION:  No acute abnormalities.   Original Report Authenticated By: Francene Boyers, M.D.   1. Elevated bilirubin   2. Pancreatic mass   3. DM (diabetes mellitus)   4. Dyslipidemia   5. HTN (hypertension)   6. Obstructive jaundice     MDM  Patient is a 53 yoaaf with a history of chronic abdominal pain.  She was diagnosed with chronic pancreatitis.  We gave dilaudid for pain and zofran for nausea.  A XR of her abdomen and chest shows no acute abnormalities.  Her AST/ALT/AP were elevated and so we ordered an [place] which revealed intrahepatic bile duct dilatation with dilated common bile duct and pancreatic head suspicious for a pancreatic mass or pancreatitis.  MRCP with and without contrast is recommended.  A thickened gallbladder wall without pericholecystic fluid or sonographic Murphy's sign.  There is sludge in the gallbladder.  We also did a hepatitis panel which is pending.  We will admit her for further evaluation.         Boykin Peek, MD 05/22/13 1431

## 2013-05-22 NOTE — ED Notes (Signed)
Report attempted 

## 2013-05-22 NOTE — Progress Notes (Signed)
NURSING PROGRESS NOTE  [name] [name] [id number] Admission Data: 05/22/2013 4:25 PM Attending Provider: Maretta Bees, MD WUJ:WJXBJYN,WGNFA A, MD Code Status: full  [name] [name] is a 57 y.o. female patient admitted from ED:  -No acute distress noted.  -No complaints of shortness of breath.  -No complaints of chest pain.   Blood pressure 158/77, pulse 66, temperature 98.3 F (36.8 C), temperature source Oral, resp. rate 16, height 5\' 3"  (1.6 m), weight 70.5 kg (155 lb 6.8 oz), SpO2 96.00%.   IV Fluids:  IV in place, occlusive dsg intact without redness, IV cath antecubital left, condition patent and no redness normal saline.   Allergies:  Betadine  Past Medical History:   has a past medical history of Diabetes mellitus without complication; Hypertension; Depression; Anxiety; Hyperlipidemia; Pancreatitis; and GERD (gastroesophageal reflux disease).  Past Surgical History:   has past surgical history that includes Appendectomy.  Social History:   reports that she has been smoking.  She does not have any smokeless tobacco history on file. She reports that she does not drink alcohol or use illicit drugs.  Skin: intact  Patient/Family orientated to room. Information packet given to patient/family. Admission inpatient armband information verified with patient/family to include name and date of birth and placed on patient arm. Side rails up x 2, fall assessment and education completed with patient/family. Patient/family able to verbalize understanding of risk associated with falls and verbalized understanding to call for assistance before getting out of bed. Call light within reach. Patient/family able to voice and demonstrate understanding of unit orientation instructions.    Will continue to evaluate and treat per MD orders.

## 2013-05-23 DIAGNOSIS — R1013 Epigastric pain: Secondary | ICD-10-CM

## 2013-05-23 DIAGNOSIS — G8929 Other chronic pain: Secondary | ICD-10-CM

## 2013-05-23 DIAGNOSIS — K838 Other specified diseases of biliary tract: Secondary | ICD-10-CM

## 2013-05-23 LAB — BASIC METABOLIC PANEL
CO2: 26 mEq/L (ref 19–32)
Chloride: 103 mEq/L (ref 96–112)
GFR calc non Af Amer: 78 mL/min — ABNORMAL LOW (ref >90)
Glucose, Bld: 120 mg/dL — ABNORMAL HIGH (ref 70–99)
Potassium: 3.5 mEq/L (ref 3.5–5.1)
Sodium: 135 mEq/L (ref 135–145)

## 2013-05-23 LAB — HEPATIC FUNCTION PANEL
ALT: 292 U/L — ABNORMAL HIGH (ref 0–35)
AST: 161 U/L — ABNORMAL HIGH (ref 0–37)
Albumin: 3.1 g/dL — ABNORMAL LOW (ref 3.5–5.2)
Alkaline Phosphatase: 372 U/L — ABNORMAL HIGH (ref 39–117)
Total Bilirubin: 4.6 mg/dL — ABNORMAL HIGH (ref 0.3–1.2)

## 2013-05-23 LAB — HEPATITIS PANEL, ACUTE
HCV Ab: NEGATIVE
Hep A IgM: NEGATIVE

## 2013-05-23 LAB — CBC
HCT: 34.7 % — ABNORMAL LOW (ref 36.0–46.0)
Hemoglobin: 11.6 g/dL — ABNORMAL LOW (ref 12.0–15.0)
MCV: 85.7 fL (ref 78.0–100.0)
WBC: 6.3 10*3/uL (ref 4.0–10.5)

## 2013-05-23 NOTE — Progress Notes (Signed)
PATIENT DETAILS Name: [name] [name] Age: 57 y.o. Sex: female Date of Birth: June 14, 1956 Admit Date: 05/22/2013 Admitting Physician Dewayne Shorter Levora Dredge, MD AVW:UJWJXBJ,YNWGN A, MD  Subjective: No major complaints  Assessment/Plan: Principal Problem:   Obstructive jaundice -suspected Pancreatic Head Neoplasm as seen on Abd Ultrasound, MRCP done-but results pending -LFT's stable -consult GI-spoke with Dr Rhea Belton  Active Problems:   DM (diabetes mellitus) -CBG's stable -c/w Lantus 20 units and Novolog 10 units TID with meals    HTN (hypertension) -controlled with HCTZ and Lisinopril    Dyslipidemia -statins on hold    Hx of Chronic pancreatitis -?hx of prior ETOH pancreatitis -prn narcotics -await MRCP   Disposition: Remain inpatient  DVT Prophylaxis: Prophylactic Lovenox   Code Status: Full code   Family Communication Daughter at bedside yesterday  Procedures:  None  CONSULTS:  GI   MEDICATIONS: Scheduled Meds: . enoxaparin (LOVENOX) injection  40 mg Subcutaneous Q24H  . gabapentin  300 mg Oral TID  . hydrochlorothiazide  25 mg Oral Daily  . insulin aspart  0-9 Units Subcutaneous TID WC  . insulin aspart  10 Units Subcutaneous TID WC  . insulin glargine  20 Units Subcutaneous QHS  . lisinopril  20 mg Oral Daily   Continuous Infusions: . sodium chloride 75 mL/hr at 05/23/13 0707   PRN Meds:.acetaminophen, acetaminophen, albuterol, alum & mag hydroxide-simeth, morphine injection, ondansetron (ZOFRAN) IV, ondansetron, oxyCODONE  Antibiotics: Anti-infectives   None       PHYSICAL EXAM: Vital signs in last 24 hours: Filed Vitals:   05/22/13 1130 05/22/13 1456 05/22/13 2056 05/23/13 0510  BP: 179/86 158/77 160/89 167/100  Pulse: 65 66 76 76  Temp:  98.3 F (36.8 C) 98.9 F (37.2 C) 98.6 F (37 C)  TempSrc:  Oral Oral Oral  Resp:  16 16 16   Height:  5\' 3"  (1.6 m)    Weight:  70.5 kg (155 lb 6.8 oz)    SpO2: 98% 96% 99% 97%    Weight change:  -1.168 kg (-2 lb 9.2 oz) Filed Weights   05/22/13 0657 05/22/13 1456  Weight: 71.668 kg (158 lb) 70.5 kg (155 lb 6.8 oz)   Body mass index is 27.54 kg/(m^2).   Gen Exam: Awake and alert with clear speech.   Neck: Supple, No JVD.   Chest: B/L Clear.   CVS: S1 S2 Regular, no murmurs.  Abdomen: soft, BS +, non tender, non distended.  Extremities: no edema, lower extremities warm to touch. Neurologic: Non Focal.   Skin: No Rash.   Wounds: N/A.    Intake/Output from previous day:  Intake/Output Summary (Last 24 hours) at 05/23/13 0950 Last data filed at 05/23/13 0857  Gross per 24 hour  Intake    240 ml  Output      1 ml  Net    239 ml     LAB RESULTS: CBC  Recent Labs Lab 05/22/13 0745 05/22/13 1510 05/23/13 0350  WBC 7.6 7.4 6.3  HGB 13.5 12.2 11.6*  HCT 38.7 35.9* 34.7*  PLT 192 180 176  MCV 83.6 84.1 85.7  MCH 29.2 28.6 28.6  MCHC 34.9 34.0 33.4  RDW 14.5 14.6 15.1  LYMPHSABS 2.2  --   --   MONOABS 0.5  --   --   EOSABS 0.2  --   --   BASOSABS 0.0  --   --     Chemistries   Recent Labs Lab 05/22/13 0745 05/22/13 1510 05/23/13 0350  NA 137  --  135  K 3.1*  --  3.5  CL 99  --  103  CO2 27  --  26  GLUCOSE 154*  --  120*  BUN 15  --  16  CREATININE 0.82 0.80 0.81  CALCIUM 9.8  --  9.1    CBG:  Recent Labs Lab 05/22/13 1234 05/22/13 1454 05/22/13 1815 05/22/13 2139 05/23/13 0752  GLUCAP 110* 100* 91 239* 118*    GFR Estimated Creatinine Clearance: 70.4 ml/min (by C-G formula based on Cr of 0.81).  Coagulation profile  Recent Labs Lab 05/22/13 1422  INR 0.97    Cardiac Enzymes No results found for this basename: CK, CKMB, TROPONINI, MYOGLOBIN,  in the last 168 hours  No components found with this basename: POCBNP,  No results found for this basename: DDIMER,  in the last 72 hours No results found for this basename: HGBA1C,  in the last 72 hours No results found for this basename: CHOL, HDL, LDLCALC, TRIG, CHOLHDL, LDLDIRECT,   in the last 72 hours No results found for this basename: TSH, T4TOTAL, FREET3, T3FREE, THYROIDAB,  in the last 72 hours No results found for this basename: VITAMINB12, FOLATE, FERRITIN, TIBC, IRON, RETICCTPCT,  in the last 72 hours  Recent Labs  05/22/13 0745  LIPASE 88*    Urine Studies No results found for this basename: UACOL, UAPR, USPG, UPH, UTP, UGL, UKET, UBIL, UHGB, UNIT, UROB, ULEU, UEPI, UWBC, URBC, UBAC, CAST, CRYS, UCOM, BILUA,  in the last 72 hours  MICROBIOLOGY: No results found for this or any previous visit (from the past 240 hour(s)).  RADIOLOGY STUDIES/RESULTS: US Abdomen Complete  05/22/2013   *RADIOLOGY REPORT*  Clinical Data:  Abdominal pain.  Elevated liver function tests. Elevated bilirubin.  COMPLETE ABDOMINAL ULTRASOUND  Comparison:  05/22/2013 radiographs  Findings:  Gallbladder:  Gallbladder wall thickening up to 5 mm.  Sonographic Murphy's sign absent.  Gallbladder sludge noted.  Common bile duct:  Abnormally prominent, measuring 1.2 cm in diameter, without directly visualized choledocholithiasis.  Liver:  Intrahepatic bile duct dilatation noted without focal hepatic lesion identified.  IVC:  Appears normal.  Pancreas:  Heterogeneous pancreatic head and body.  Possible pancreatic head mass.  This measures 4.1 x 4.1 x 3.0 cm.  Spleen:  Unremarkable, 5.0 cm craniocaudad.  Right Kidney:  Measures 11.2 cm in length and appears normal.  Left Kidney:  Measures 10.8 cm in length and contains a 1.8 cm lower pole cyst as well as a 8 mm linear echogenic structure along the renal pelvis which could represent a linear renal calculus.  Abdominal aorta:  No aneurysm identified.  Aortic bifurcation obscured by overlying bowel gas.  IMPRESSION:  1.  Abnormal intrahepatic bile duct dilatation with dilated common bile duct and heterogeneous pancreatic head suspicious for possible pancreatic mass, although localized pancreatitis could possibly have a similar sonographic appearance.  MRCP  with and without contrast is recommended for further characterization.  If this is not feasible, consider a pancreatic protocol CT scan with and without contrast to assess for malignancy. 2.  Thickened gallbladder wall without pericholecystic fluid or sonographic Murphy's sign.  Correlate clinically in assessing for cholecystitis.  There is sludge in the gallbladder. 3.  Possible small linear left kidney lower pole calculus.   Original Report Authenticated By: Gaylyn Rong, M.D.   Dg Abd Acute W/chest  05/22/2013   *RADIOLOGY REPORT*  Clinical Data: Abdominal pain and back pain.  ACUTE ABDOMEN SERIES (ABDOMEN 2 VIEW & CHEST 1 VIEW)  Comparison:  Lumbar radiographs dated 03/19/2013  Findings: There is borderline cardiomegaly.  Pulmonary vascularity is normal and the lungs are clear.  No free air or free fluid in the abdomen.  Normal bowel gas pattern.  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  No acute osseous abnormality.  IMPRESSION: No acute abnormalities.   Original Report Authenticated By: Francene Boyers, M.D.    Jeoffrey Massed, MD  [facility] Pager:[phone]  If 7PM-7AM, please contact night-coverage [email or web address] Password [email or web address] 05/23/2013, 9:50 AM   LOS: 1 day

## 2013-05-23 NOTE — ED Provider Notes (Signed)
I have supervised the resident on the management of this patient and agree with the note above. I personally interviewed and examined the patient and my addendum is below.   [name] [name] is a 57 y.o. female hx of pancreatitis, DM here with worsening of chronic abdominal pain. Worsening pain over last week. Also some nausea and vomiting. Also notes that her urine is darker and might have some blood in it. Came here a month ago for similar symptoms and was given norco. Mild diffuse abdominal tenderness but very soft and no rebound. Lipase mildly elevated. But LFT and ALP and bili elevated. Also bilirubin in urine.  US showed dilated CBD and possible pancreatic mass. Will admit for further workup.    Richardean Canal, MD 05/23/13 2217

## 2013-05-23 NOTE — Consult Note (Signed)
[facility] Consultation  Referring Provider: Dr. Jerral Ralph, MD (Hospitalist) Primary Care Physician:  Dorrene German, MD Primary Gastroenterologist:  none  Reason for Consultation:  Biliary obstruction/upper abd pain  HPI: [name] [name] is a 57 y.o. female with history of alcohol abuse and reported history of chronic pancreatitis, diabetes, hypertension, history of CVA who presented to the ER with upper abdominal pain and dark urine.  The patient reports being new to [place], having moved from [facility], but previous from [place].  She reports a history of alcohol abuse over the years but denies alcohol intake for approximately one year. She states that she has history of chronic upper abdominal pain, but over the last 3 days her abdominal pain worsen. She's had several episodes of nonbloody/nonbilious emesis.  Her nausea is worse with eating. He also reports clay-colored stools and dark urine over the last 3-5 days. She denies dysuria. No chest pain or dyspnea. No fever or chills. She denies a history of change in bowel habits, other than light-colored stools. She denies diarrhea or constipation. She feels that she may have lost 15 pounds over the last several years but none recently.  Appetite has fluctuated   Past Medical History  Diagnosis Date  . Diabetes mellitus without complication   . Hypertension   . Depression   . Anxiety   . Hyperlipidemia   . Pancreatitis   . GERD (gastroesophageal reflux disease)     Past Surgical History  Procedure Laterality Date  . Appendectomy      Prior to Admission medications   Medication Sig Start Date End Date Taking? Authorizing Provider  gabapentin (NEURONTIN) 300 MG capsule Take 300 mg by mouth 3 (three) times daily.   Yes Historical Provider, MD  HYDROcodone-acetaminophen (NORCO/VICODIN) 5-325 MG per tablet Take 1-2 tablets by mouth 4 (four) times daily as needed for pain (pt can take up to 2 tabs for pain). 03/19/13  Yes Antony Madura, PA-C   insulin aspart (NOVOLOG) 100 UNIT/ML injection Inject 10 Units into the skin 3 (three) times daily with meals.   Yes Historical Provider, MD  insulin glargine (LANTUS) 100 UNIT/ML injection Inject 20 Units into the skin at bedtime.   Yes Historical Provider, MD  lisinopril-hydrochlorothiazide (PRINZIDE,ZESTORETIC) 20-25 MG per tablet Take 1 tablet by mouth daily.   Yes Historical Provider, MD  potassium chloride (K-DUR,KLOR-CON) 10 MEQ tablet Take 10 mEq by mouth daily.   Yes Historical Provider, MD  simvastatin (ZOCOR) 10 MG tablet Take 10 mg by mouth at bedtime.   Yes Historical Provider, MD    Current Facility-Administered Medications  Medication Dose Route Frequency Provider Last Rate Last Dose  . 0.9 %  sodium chloride infusion   Intravenous Continuous Maretta Bees, MD 75 mL/hr at 05/23/13 0707    . acetaminophen (TYLENOL) tablet 650 mg  650 mg Oral Q6H PRN Shanker Levora Dredge, MD       Or  . acetaminophen (TYLENOL) suppository 650 mg  650 mg Rectal Q6H PRN Shanker Levora Dredge, MD      . albuterol (PROVENTIL) (5 MG/ML) 0.5% nebulizer solution 2.5 mg  2.5 mg Nebulization Q2H PRN Shanker Levora Dredge, MD      . alum & mag hydroxide-simeth (MAALOX/MYLANTA) 200-200-20 MG/5ML suspension 30 mL  30 mL Oral Q6H PRN Shanker Levora Dredge, MD      . enoxaparin (LOVENOX) injection 40 mg  40 mg Subcutaneous Q24H Maretta Bees, MD   40 mg at 05/22/13 1851  . gabapentin (NEURONTIN) capsule 300  mg  300 mg Oral TID Maretta Bees, MD   300 mg at 05/23/13 1027  . hydrochlorothiazide (HYDRODIURIL) tablet 25 mg  25 mg Oral Daily Maretta Bees, MD   25 mg at 05/23/13 1027  . insulin aspart (novoLOG) injection 0-9 Units  0-9 Units Subcutaneous TID WC Shanker Levora Dredge, MD      . insulin aspart (novoLOG) injection 10 Units  10 Units Subcutaneous TID WC Maretta Bees, MD   10 Units at 05/23/13 1243  . insulin glargine (LANTUS) injection 20 Units  20 Units Subcutaneous QHS Maretta Bees, MD   20  Units at 05/22/13 2152  . lisinopril (PRINIVIL,ZESTRIL) tablet 20 mg  20 mg Oral Daily Maretta Bees, MD   20 mg at 05/23/13 1027  . morphine 2 MG/ML injection 2 mg  2 mg Intravenous Q4H PRN Maretta Bees, MD   2 mg at 05/22/13 2152  . ondansetron (ZOFRAN) tablet 4 mg  4 mg Oral Q6H PRN Shanker Levora Dredge, MD       Or  . ondansetron [facility]) injection 4 mg  4 mg Intravenous Q6H PRN Shanker Levora Dredge, MD      . oxyCODONE (Oxy IR/ROXICODONE) immediate release tablet 5 mg  5 mg Oral Q4H PRN Maretta Bees, MD        Allergies as of 05/22/2013 - Review Complete 05/22/2013  Allergen Reaction Noted  . Betadine (povidone iodine) Rash 03/19/2013    Family History  Problem Relation Age of Onset  . Diabetes type II Mother   . Hypertension Mother   . Heart attack Father   . HIV/AIDS Brother     History   Social History  . Marital Status: Single    Spouse Name: N/A    Number of Children: N/A  . Years of Education: N/A   Occupational History  . Not on file.   Social History Main Topics  . Smoking status: Current Every Day Smoker -- 0.50 packs/day for 42 years  . Smokeless tobacco: Not on file  . Alcohol Use: No  . Drug Use: No  . Sexually Active: Yes    Birth Control/ Protection: Post-menopausal   Other Topics Concern  . Not on file   Social History Narrative  . No narrative on file    Review of Systems: As per history of present illness, otherwise negative  Physical Exam: Vital signs in last 24 hours: Temp:  [98.3 F (36.8 C)-98.9 F (37.2 C)] 98.6 F (37 C) (07/06 0510) Pulse Rate:  [66-76] 76 (07/06 0510) Resp:  [16] 16 (07/06 0510) BP: (158-167)/(77-100) 167/100 mmHg (07/06 0510) SpO2:  [96 %-99 %] 97 % (07/06 0510) Weight:  [155 lb 6.8 oz (70.5 kg)] 155 lb 6.8 oz (70.5 kg) (07/05 1456) Last BM Date: 05/22/13 Gen: awake, alert, NAD HEENT: icteric sclera, exophthalmos in appearance, op clear CV: RRR Pulm: CTA b/l Abd: soft, mild upper abdominal  tenderness with no rebound or guarding, ND, +BS throughout Ext: no c/c/e Neuro: nonfocal  Intake/Output from previous day:   Intake/Output this shift: Total I/O In: 240 [P.O.:240] Out: 1 [Urine:1]  Lab Results:  Recent Labs  05/22/13 0745 05/22/13 1510 05/23/13 0350  WBC 7.6 7.4 6.3  HGB 13.5 12.2 11.6*  HCT 38.7 35.9* 34.7*  PLT 192 180 176   BMET  Recent Labs  05/22/13 0745 05/22/13 1510 05/23/13 0350  NA 137  --  135  K 3.1*  --  3.5  CL 99  --  103  CO2 27  --  26  GLUCOSE 154*  --  120*  BUN 15  --  16  CREATININE 0.82 0.80 0.81  CALCIUM 9.8  --  9.1   LFT  Recent Labs  05/23/13 0350  PROT 6.8  ALBUMIN 3.1*  AST 161*  ALT 292*  ALKPHOS 372*  BILITOT 4.6*  BILIDIR 3.8*  IBILI 0.8   PT/INR  Recent Labs  05/22/13 1422  LABPROT 12.7  INR 0.97   Hepatitis Panel  Recent Labs  05/22/13 1020  HEPBSAG NEGATIVE  HCVAB NEGATIVE  HEPAIGM NEGATIVE  HEPBIGM NEGATIVE    Studies/Results: US Abdomen Complete  05/22/2013   *RADIOLOGY REPORT*  Clinical Data:  Abdominal pain.  Elevated liver function tests. Elevated bilirubin.  COMPLETE ABDOMINAL ULTRASOUND  Comparison:  05/22/2013 radiographs  Findings:  Gallbladder:  Gallbladder wall thickening up to 5 mm.  Sonographic Murphy's sign absent.  Gallbladder sludge noted.  Common bile duct:  Abnormally prominent, measuring 1.2 cm in diameter, without directly visualized choledocholithiasis.  Liver:  Intrahepatic bile duct dilatation noted without focal hepatic lesion identified.  IVC:  Appears normal.  Pancreas:  Heterogeneous pancreatic head and body.  Possible pancreatic head mass.  This measures 4.1 x 4.1 x 3.0 cm.  Spleen:  Unremarkable, 5.0 cm craniocaudad.  Right Kidney:  Measures 11.2 cm in length and appears normal.  Left Kidney:  Measures 10.8 cm in length and contains a 1.8 cm lower pole cyst as well as a 8 mm linear echogenic structure along the renal pelvis which could represent a linear renal  calculus.  Abdominal aorta:  No aneurysm identified.  Aortic bifurcation obscured by overlying bowel gas.  IMPRESSION:  1.  Abnormal intrahepatic bile duct dilatation with dilated common bile duct and heterogeneous pancreatic head suspicious for possible pancreatic mass, although localized pancreatitis could possibly have a similar sonographic appearance.  MRCP with and without contrast is recommended for further characterization.  If this is not feasible, consider a pancreatic protocol CT scan with and without contrast to assess for malignancy. 2.  Thickened gallbladder wall without pericholecystic fluid or sonographic Murphy's sign.  Correlate clinically in assessing for cholecystitis.  There is sludge in the gallbladder. 3.  Possible small linear left kidney lower pole calculus.   Original Report Authenticated By: Gaylyn Rong, M.D.   Dg Abd Acute W/chest  05/22/2013   *RADIOLOGY REPORT*  Clinical Data: Abdominal pain and back pain.  ACUTE ABDOMEN SERIES (ABDOMEN 2 VIEW & CHEST 1 VIEW)  Comparison: Lumbar radiographs dated 03/19/2013  Findings: There is borderline cardiomegaly.  Pulmonary vascularity is normal and the lungs are clear.  No free air or free fluid in the abdomen.  Normal bowel gas pattern.  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  No acute osseous abnormality.  IMPRESSION: No acute abnormalities.   Original Report Authenticated By: Francene Boyers, M.D.   Previous Endoscopies: unknown  Impression/ Recommendations: 57 y.o. female with history of alcohol abuse and reported history of chronic pancreatitis, diabetes, hypertension, history of CVA who presented to the ER with upper abdominal pain and dark urine  1.  Biliary obstruction/jaundice -- MRCP/MRI abd has been done and report is pending.  US shows markedly dilation of both the common bile duct and intrahepatic bile ducts.  If her history of chronic pancreatitis as correct, this is concerning for possible pancreatic head  malignancy. The MRI should help better assess the head of pancreas.  My suspicion for choledocholithiasis is low, but this is possible.  There is no evidence for cholangitis at present.  Recommendation is for ERCP both for decompression of the biliary tree and diagnosis (biliary brushings).  She may need EUS as well, but likely ERCP first for decompression.  I will discuss this with Dr. Christella Hartigan who will be taking over the service tomorrow, and who would be performing any advanced endoscopic procedures.  NPO after MN. Trend LFTs --await MRCP reading --likely ERCP tomorrow --NPO after MN --follow LFTs    LOS: 1 day   PYRTLE, JAY M  05/23/2013, 1:08 PM

## 2013-05-24 ENCOUNTER — Inpatient Hospital Stay (HOSPITAL_COMMUNITY): Payer: Medicaid Other | Admitting: Anesthesiology

## 2013-05-24 ENCOUNTER — Encounter (HOSPITAL_COMMUNITY)
Admission: EM | Disposition: A | Discharge status: Home / Self Care | Payer: Self-pay | Source: Home / Self Care | Attending: Internal Medicine

## 2013-05-24 ENCOUNTER — Inpatient Hospital Stay (HOSPITAL_COMMUNITY): Payer: Medicaid Other

## 2013-05-24 ENCOUNTER — Encounter (HOSPITAL_COMMUNITY): Payer: Self-pay | Admitting: Anesthesiology

## 2013-05-24 DIAGNOSIS — R17 Unspecified jaundice: Secondary | ICD-10-CM

## 2013-05-24 DIAGNOSIS — K8689 Other specified diseases of pancreas: Secondary | ICD-10-CM

## 2013-05-24 DIAGNOSIS — K869 Disease of pancreas, unspecified: Secondary | ICD-10-CM

## 2013-05-24 HISTORY — PX: ERCP: SHX5425

## 2013-05-24 LAB — HEPATIC FUNCTION PANEL
ALT: 315 U/L — ABNORMAL HIGH (ref 0–35)
Alkaline Phosphatase: 408 U/L — ABNORMAL HIGH (ref 39–117)
Bilirubin, Direct: 3.6 mg/dL — ABNORMAL HIGH (ref 0.0–0.3)
Indirect Bilirubin: 1.4 mg/dL — ABNORMAL HIGH (ref 0.3–0.9)
Total Bilirubin: 5 mg/dL — ABNORMAL HIGH (ref 0.3–1.2)

## 2013-05-24 LAB — GLUCOSE, CAPILLARY
Glucose-Capillary: 90 mg/dL (ref 70–99)
Glucose-Capillary: 90 mg/dL (ref 70–99)
Glucose-Capillary: 112 mg/dL — ABNORMAL HIGH (ref 70–99)
Glucose-Capillary: 204 mg/dL — ABNORMAL HIGH (ref 70–99)
Glucose-Capillary: 223 mg/dL — ABNORMAL HIGH (ref 70–99)

## 2013-05-24 SURGERY — ERCP, WITH INTERVENTION IF INDICATED
Anesthesia: Choice

## 2013-05-24 MED ORDER — PHENOL 1.4 % MT LIQD
1.0000 | OROMUCOSAL | Status: DC | PRN
  Filled 2013-05-24: qty 177

## 2013-05-24 MED ORDER — FENTANYL CITRATE 0.05 MG/ML IJ SOLN: 25.0000 ug | INTRAMUSCULAR | Status: DC | PRN

## 2013-05-24 MED ORDER — SODIUM CHLORIDE 0.9 % IV SOLN: INTRAVENOUS | Status: DC

## 2013-05-24 MED ORDER — NEOSTIGMINE METHYLSULFATE 1 MG/ML IJ SOLN
INTRAMUSCULAR | Status: DC | PRN
  Administered 2013-05-24: 5 mg via INTRAVENOUS

## 2013-05-24 MED ORDER — ZOLPIDEM TARTRATE 5 MG PO TABS
5.0000 mg | ORAL_TABLET | Freq: Once | ORAL | Status: AC
  Administered 2013-05-25: 5 mg via ORAL
  Filled 2013-05-24: qty 1

## 2013-05-24 MED ORDER — SODIUM CHLORIDE 0.9 % IV SOLN
INTRAVENOUS | Status: DC | PRN
  Administered 2013-05-24: 13:00:00

## 2013-05-24 MED ORDER — METOPROLOL TARTRATE 1 MG/ML IV SOLN
INTRAVENOUS | Status: DC | PRN
  Administered 2013-05-24: 2 mg via INTRAVENOUS

## 2013-05-24 MED ORDER — PANTOPRAZOLE SODIUM 40 MG IV SOLR
40.0000 mg | Freq: Once | INTRAVENOUS | Status: AC
  Administered 2013-05-24: 40 mg via INTRAVENOUS
  Filled 2013-05-24: qty 40

## 2013-05-24 MED ORDER — LIDOCAINE HCL (CARDIAC) 20 MG/ML IV SOLN
INTRAVENOUS | Status: DC | PRN
  Administered 2013-05-24: 60 mg via INTRAVENOUS

## 2013-05-24 MED ORDER — GLYCOPYRROLATE 0.2 MG/ML IJ SOLN
INTRAMUSCULAR | Status: DC | PRN
  Administered 2013-05-24: 0.4 mg via INTRAVENOUS

## 2013-05-24 MED ORDER — FENTANYL CITRATE 0.05 MG/ML IJ SOLN
INTRAMUSCULAR | Status: DC | PRN
  Administered 2013-05-24: 50 ug via INTRAVENOUS

## 2013-05-24 MED ORDER — ENSURE COMPLETE PO LIQD: 237.0000 mL | Freq: Every day | ORAL | Status: DC | PRN

## 2013-05-24 MED ORDER — LIDOCAINE HCL 4 % MT SOLN
OROMUCOSAL | Status: DC | PRN
  Administered 2013-05-24: 4 mL via TOPICAL

## 2013-05-24 MED ORDER — EPHEDRINE SULFATE 50 MG/ML IJ SOLN
INTRAMUSCULAR | Status: DC | PRN
  Administered 2013-05-24: 5 mg via INTRAVENOUS
  Administered 2013-05-24 (×2): 10 mg via INTRAVENOUS
  Administered 2013-05-24: 5 mg via INTRAVENOUS

## 2013-05-24 MED ORDER — ROCURONIUM BROMIDE 100 MG/10ML IV SOLN
INTRAVENOUS | Status: DC | PRN
  Administered 2013-05-24: 40 mg via INTRAVENOUS

## 2013-05-24 MED ORDER — PROPOFOL 10 MG/ML IV BOLUS
INTRAVENOUS | Status: DC | PRN
  Administered 2013-05-24: 200 mg via INTRAVENOUS

## 2013-05-24 MED ORDER — OXYCODONE HCL 5 MG PO TABS: 5.0000 mg | ORAL_TABLET | Freq: Once | ORAL | Status: DC | PRN

## 2013-05-24 MED ORDER — OXYCODONE HCL 5 MG/5ML PO SOLN: 5.0000 mg | Freq: Once | ORAL | Status: DC | PRN

## 2013-05-24 MED ORDER — LACTATED RINGERS IV SOLN
INTRAVENOUS | Status: DC | PRN
  Administered 2013-05-24: 12:00:00 via INTRAVENOUS

## 2013-05-24 MED ORDER — METOCLOPRAMIDE HCL 5 MG/ML IJ SOLN: 10.0000 mg | Freq: Once | INTRAMUSCULAR | Status: DC | PRN

## 2013-05-24 NOTE — Anesthesia Procedure Notes (Signed)
Procedure Name: Intubation Date/Time: 05/24/2013 12:25 PM Performed by: Leona Singleton A Pre-anesthesia Checklist: Patient identified, Emergency Drugs available, Suction available and Patient being monitored Patient Re-evaluated:Patient Re-evaluated prior to inductionOxygen Delivery Method: Circle system utilized Preoxygenation: Pre-oxygenation with 100% oxygen Intubation Type: IV induction Ventilation: Mask ventilation without difficulty Laryngoscope Size: Miller and 2 Grade View: Grade I Tube type: Oral Tube size: 7.5 mm Number of attempts: 1 Airway Equipment and Method: Stylet and LTA kit utilized Placement Confirmation: ETT inserted through vocal cords under direct vision,  positive ETCO2 and breath sounds checked- equal and bilateral Secured at: 21 cm Tube secured with: Tape Dental Injury: Teeth and Oropharynx as per pre-operative assessment

## 2013-05-24 NOTE — H&P (View-Only) (Signed)
[facility] Consultation  Referring Provider: Dr. Ghimire, MD (Hospitalist) Primary Care Physician:  AVBUERE,EDWIN A, MD Primary Gastroenterologist:  none  Reason for Consultation:  Biliary obstruction/upper abd pain  HPI: [name] [name] is a 57 y.o. female with history of alcohol abuse and reported history of chronic pancreatitis, diabetes, hypertension, history of CVA who presented to the ER with upper abdominal pain and dark urine.  The patient reports being new to [place], having moved from [place], but previous from [place].  She reports a history of alcohol abuse over the years but denies alcohol intake for approximately one year. She states that she has history of chronic upper abdominal pain, but over the last 3 days her abdominal pain worsen. She's had several episodes of nonbloody/nonbilious emesis.  Her nausea is worse with eating. He also reports clay-colored stools and dark urine over the last 3-5 days. She denies dysuria. No chest pain or dyspnea. No fever or chills. She denies a history of change in bowel habits, other than light-colored stools. She denies diarrhea or constipation. She feels that she may have lost 15 pounds over the last several years but none recently.  Appetite has fluctuated   Past Medical History  Diagnosis Date  . Diabetes mellitus without complication   . Hypertension   . Depression   . Anxiety   . Hyperlipidemia   . Pancreatitis   . GERD (gastroesophageal reflux disease)     Past Surgical History  Procedure Laterality Date  . Appendectomy      Prior to Admission medications   Medication Sig Start Date End Date Taking? Authorizing Provider  gabapentin (NEURONTIN) 300 MG capsule Take 300 mg by mouth 3 (three) times daily.   Yes Historical Provider, MD  HYDROcodone-acetaminophen (NORCO/VICODIN) 5-325 MG per tablet Take 1-2 tablets by mouth 4 (four) times daily as needed for pain (pt can take up to 2 tabs for pain). 03/19/13  Yes Kelly Humes, PA-C   insulin aspart (NOVOLOG) 100 UNIT/ML injection Inject 10 Units into the skin 3 (three) times daily with meals.   Yes Historical Provider, MD  insulin glargine (LANTUS) 100 UNIT/ML injection Inject 20 Units into the skin at bedtime.   Yes Historical Provider, MD  lisinopril-hydrochlorothiazide (PRINZIDE,ZESTORETIC) 20-25 MG per tablet Take 1 tablet by mouth daily.   Yes Historical Provider, MD  potassium chloride (K-DUR,KLOR-CON) 10 MEQ tablet Take 10 mEq by mouth daily.   Yes Historical Provider, MD  simvastatin (ZOCOR) 10 MG tablet Take 10 mg by mouth at bedtime.   Yes Historical Provider, MD    Current Facility-Administered Medications  Medication Dose Route Frequency Provider Last Rate Last Dose  . 0.9 %  sodium chloride infusion   Intravenous Continuous Shanker M Ghimire, MD 75 mL/hr at 05/23/13 0707    . acetaminophen (TYLENOL) tablet 650 mg  650 mg Oral Q6H PRN Shanker M Ghimire, MD       Or  . acetaminophen (TYLENOL) suppository 650 mg  650 mg Rectal Q6H PRN Shanker M Ghimire, MD      . albuterol (PROVENTIL) (5 MG/ML) 0.5% nebulizer solution 2.5 mg  2.5 mg Nebulization Q2H PRN Shanker M Ghimire, MD      . alum & mag hydroxide-simeth (MAALOX/MYLANTA) 200-200-20 MG/5ML suspension 30 mL  30 mL Oral Q6H PRN Shanker M Ghimire, MD      . enoxaparin (LOVENOX) injection 40 mg  40 mg Subcutaneous Q24H Shanker M Ghimire, MD   40 mg at 05/22/13 1851  . gabapentin (NEURONTIN) capsule 300   mg  300 mg Oral TID Shanker M Ghimire, MD   300 mg at 05/23/13 1027  . hydrochlorothiazide (HYDRODIURIL) tablet 25 mg  25 mg Oral Daily Shanker M Ghimire, MD   25 mg at 05/23/13 1027  . insulin aspart (novoLOG) injection 0-9 Units  0-9 Units Subcutaneous TID WC Shanker M Ghimire, MD      . insulin aspart (novoLOG) injection 10 Units  10 Units Subcutaneous TID WC Shanker M Ghimire, MD   10 Units at 05/23/13 1243  . insulin glargine (LANTUS) injection 20 Units  20 Units Subcutaneous QHS Shanker M Ghimire, MD   20  Units at 05/22/13 2152  . lisinopril (PRINIVIL,ZESTRIL) tablet 20 mg  20 mg Oral Daily Shanker M Ghimire, MD   20 mg at 05/23/13 1027  . morphine 2 MG/ML injection 2 mg  2 mg Intravenous Q4H PRN Shanker M Ghimire, MD   2 mg at 05/22/13 2152  . ondansetron (ZOFRAN) tablet 4 mg  4 mg Oral Q6H PRN Shanker M Ghimire, MD       Or  . ondansetron (ZOFRAN) injection 4 mg  4 mg Intravenous Q6H PRN Shanker M Ghimire, MD      . oxyCODONE (Oxy IR/ROXICODONE) immediate release tablet 5 mg  5 mg Oral Q4H PRN Shanker M Ghimire, MD        Allergies as of 05/22/2013 - Review Complete 05/22/2013  Allergen Reaction Noted  . Betadine (povidone iodine) Rash 03/19/2013    Family History  Problem Relation Age of Onset  . Diabetes type II Mother   . Hypertension Mother   . Heart attack Father   . HIV/AIDS Brother     History   Social History  . Marital Status: Single    Spouse Name: N/A    Number of Children: N/A  . Years of Education: N/A   Occupational History  . Not on file.   Social History Main Topics  . Smoking status: Current Every Day Smoker -- 0.50 packs/day for 42 years  . Smokeless tobacco: Not on file  . Alcohol Use: No  . Drug Use: No  . Sexually Active: Yes    Birth Control/ Protection: Post-menopausal   Other Topics Concern  . Not on file   Social History Narrative  . No narrative on file    Review of Systems: As per history of present illness, otherwise negative  Physical Exam: Vital signs in last 24 hours: Temp:  [98.3 F (36.8 C)-98.9 F (37.2 C)] 98.6 F (37 C) (07/06 0510) Pulse Rate:  [66-76] 76 (07/06 0510) Resp:  [16] 16 (07/06 0510) BP: (158-167)/(77-100) 167/100 mmHg (07/06 0510) SpO2:  [96 %-99 %] 97 % (07/06 0510) Weight:  [155 lb 6.8 oz (70.5 kg)] 155 lb 6.8 oz (70.5 kg) (07/05 1456) Last BM Date: 05/22/13 Gen: awake, alert, NAD HEENT: icteric sclera, exophthalmos in appearance, op clear CV: RRR Pulm: CTA b/l Abd: soft, mild upper abdominal  tenderness with no rebound or guarding, ND, +BS throughout Ext: no c/c/e Neuro: nonfocal  Intake/Output from previous day:   Intake/Output this shift: Total I/O In: 240 [P.O.:240] Out: 1 [Urine:1]  Lab Results:  Recent Labs  05/22/13 0745 05/22/13 1510 05/23/13 0350  WBC 7.6 7.4 6.3  HGB 13.5 12.2 11.6*  HCT 38.7 35.9* 34.7*  PLT 192 180 176   BMET  Recent Labs  05/22/13 0745 05/22/13 1510 05/23/13 0350  NA 137  --  135  K 3.1*  --  3.5  CL 99  --    103  CO2 27  --  26  GLUCOSE 154*  --  120*  BUN 15  --  16  CREATININE 0.82 0.80 0.81  CALCIUM 9.8  --  9.1   LFT  Recent Labs  05/23/13 0350  PROT 6.8  ALBUMIN 3.1*  AST 161*  ALT 292*  ALKPHOS 372*  BILITOT 4.6*  BILIDIR 3.8*  IBILI 0.8   PT/INR  Recent Labs  05/22/13 1422  LABPROT 12.7  INR 0.97   Hepatitis Panel  Recent Labs  05/22/13 1020  HEPBSAG NEGATIVE  HCVAB NEGATIVE  HEPAIGM NEGATIVE  HEPBIGM NEGATIVE    Studies/Results: Us Abdomen Complete  05/22/2013   *RADIOLOGY REPORT*  Clinical Data:  Abdominal pain.  Elevated liver function tests. Elevated bilirubin.  COMPLETE ABDOMINAL ULTRASOUND  Comparison:  05/22/2013 radiographs  Findings:  Gallbladder:  Gallbladder wall thickening up to 5 mm.  Sonographic Murphy's sign absent.  Gallbladder sludge noted.  Common bile duct:  Abnormally prominent, measuring 1.2 cm in diameter, without directly visualized choledocholithiasis.  Liver:  Intrahepatic bile duct dilatation noted without focal hepatic lesion identified.  IVC:  Appears normal.  Pancreas:  Heterogeneous pancreatic head and body.  Possible pancreatic head mass.  This measures 4.1 x 4.1 x 3.0 cm.  Spleen:  Unremarkable, 5.0 cm craniocaudad.  Right Kidney:  Measures 11.2 cm in length and appears normal.  Left Kidney:  Measures 10.8 cm in length and contains a 1.8 cm lower pole cyst as well as a 8 mm linear echogenic structure along the renal pelvis which could represent a linear renal  calculus.  Abdominal aorta:  No aneurysm identified.  Aortic bifurcation obscured by overlying bowel gas.  IMPRESSION:  1.  Abnormal intrahepatic bile duct dilatation with dilated common bile duct and heterogeneous pancreatic head suspicious for possible pancreatic mass, although localized pancreatitis could possibly have a similar sonographic appearance.  MRCP with and without contrast is recommended for further characterization.  If this is not feasible, consider a pancreatic protocol CT scan with and without contrast to assess for malignancy. 2.  Thickened gallbladder wall without pericholecystic fluid or sonographic Murphy's sign.  Correlate clinically in assessing for cholecystitis.  There is sludge in the gallbladder. 3.  Possible small linear left kidney lower pole calculus.   Original Report Authenticated By: Walter Liebkemann, M.D.   Dg Abd Acute W/chest  05/22/2013   *RADIOLOGY REPORT*  Clinical Data: Abdominal pain and back pain.  ACUTE ABDOMEN SERIES (ABDOMEN 2 VIEW & CHEST 1 VIEW)  Comparison: Lumbar radiographs dated 03/19/2013  Findings: There is borderline cardiomegaly.  Pulmonary vascularity is normal and the lungs are clear.  No free air or free fluid in the abdomen.  Normal bowel gas pattern.  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  No acute osseous abnormality.  IMPRESSION: No acute abnormalities.   Original Report Authenticated By: James Maxwell, M.D.   Previous Endoscopies: unknown  Impression/ Recommendations: 57 y.o. female with history of alcohol abuse and reported history of chronic pancreatitis, diabetes, hypertension, history of CVA who presented to the ER with upper abdominal pain and dark urine  1.  Biliary obstruction/jaundice -- MRCP/MRI abd has been done and report is pending.  US shows markedly dilation of both the common bile duct and intrahepatic bile ducts.  If her history of chronic pancreatitis as correct, this is concerning for possible pancreatic head  malignancy. The MRI should help better assess the head of pancreas.  My suspicion for choledocholithiasis is low, but this is possible.   There is no evidence for cholangitis at present.  Recommendation is for ERCP both for decompression of the biliary tree and diagnosis (biliary brushings).  She may need EUS as well, but likely ERCP first for decompression.  I will discuss this with Dr. Jacobs who will be taking over the service tomorrow, and who would be performing any advanced endoscopic procedures.  NPO after MN. Trend LFTs --await MRCP reading --likely ERCP tomorrow --NPO after MN --follow LFTs    LOS: 1 day   Christinia Lambeth M  05/23/2013, 1:08 PM     

## 2013-05-24 NOTE — Interval H&P Note (Signed)
History and Physical Interval Note:  05/24/2013 12:17 PM  [name] [name]  has presented today for surgery, with the diagnosis of Biliary obstruction/jaundice  The various methods of treatment have been discussed with the patient and family. After consideration of risks, benefits and other options for treatment, the patient has consented to  Procedure(s) with comments: ENDOSCOPIC RETROGRADE CHOLANGIOPANCREATOGRAPHY (ERCP) (N/A) - MAC vs. general per anesthesia  as a surgical intervention .  The patient's history has been reviewed, patient examined, no change in status, stable for surgery.  I have reviewed the patient's chart and labs.  Questions were answered to the patient's satisfaction.     Rob Bunting

## 2013-05-24 NOTE — Anesthesia Preprocedure Evaluation (Addendum)
Anesthesia Evaluation  Patient identified by MRN, date of birth, ID band Patient awake    Reviewed: Allergy & Precautions, H&P , NPO status , Patient's Chart, lab work & pertinent test results, reviewed documented beta blocker date and time   Airway Mallampati: II TM Distance: >3 FB Neck ROM: full    Dental  (+) Teeth Intact and Dental Advisory Given   Pulmonary neg pulmonary ROS, Current Smoker,  breath sounds clear to auscultation        Cardiovascular hypertension, On Medications negative cardio ROS  Rhythm:regular     Neuro/Psych PSYCHIATRIC DISORDERS negative neurological ROS     GI/Hepatic Neg liver ROS, GERD-  Medicated,  Endo/Other  diabetes, Insulin Dependent  Renal/GU negative Renal ROS  negative genitourinary   Musculoskeletal   Abdominal (+) + obese,   Peds  Hematology negative hematology ROS (+)   Anesthesia Other Findings See surgeon's H&P   Reproductive/Obstetrics negative OB ROS                          Anesthesia Physical Anesthesia Plan  ASA: III  Anesthesia Plan: General   Post-op Pain Management:    Induction: Intravenous  Airway Management Planned: Oral ETT  Additional Equipment:   Intra-op Plan:   Post-operative Plan: Extubation in OR  Informed Consent: I have reviewed the patients History and Physical, chart, labs and discussed the procedure including the risks, benefits and alternatives for the proposed anesthesia with the patient or authorized representative who has indicated his/her understanding and acceptance.   Dental Advisory Given  Plan Discussed with: CRNA and Surgeon  Anesthesia Plan Comments:         Anesthesia Quick Evaluation

## 2013-05-24 NOTE — Progress Notes (Signed)
INITIAL NUTRITION ASSESSMENT  DOCUMENTATION CODES Per approved criteria  -Not Applicable   INTERVENTION:  Ensure Complete daily PRN (350 kcals, 13 gm protein per 8 fl oz bottle) RD to follow for nutrition care plan  NUTRITION DIAGNOSIS: Increased nutrient needs related to ? catabolic illness as evidenced by estimated nutrition needs  Goal: Oral intake with meals & supplements to meet >/= 90% of estimated nutrition needs  Monitor:  PO & supplemental intake, weight, labs, I/O's  Reason for Assessment: Malnutrition Screening Tool Report  57 y.o. female  Admitting Dx: Obstructive jaundice  ASSESSMENT: Patient with history of alcohol abuse, chronic pancreatitis, diabetes, hypertension and CVA who presented to ER with upper abdominal pain and dark urine; reported abdominal pain and several episodes of nonbloody/nonbilious emesis.   Patient's MRI showed 5 cm mass in pancreatic head causing biliary obstruction; s/p ERCP with stent placement today.  Patient would benefit having supplements available on as needed basis ---> RD to order.  Height: Ht Readings from Last 1 Encounters:  05/22/13 5\' 3"  (1.6 m)    Weight: Wt Readings from Last 1 Encounters:  05/22/13 155 lb 6.8 oz (70.5 kg)    Ideal Body Weight: 115 lb  % Ideal Body Weight: 147%  Wt Readings from Last 10 Encounters:  05/22/13 155 lb 6.8 oz (70.5 kg)  05/22/13 155 lb 6.8 oz (70.5 kg)    Usual Body Weight: ---  % Usual Body Weight: ---  BMI:  Body mass index is 27.54 kg/(m^2).  Estimated Nutritional Needs: Kcal: 1750-1950 Protein: 90-100 gm Fluid: 1.7-1.9 L  Skin: Inact  Diet Order: Cardiac  EDUCATION NEEDS: -No education needs identified at this time   Intake/Output Summary (Last 24 hours) at 05/24/13 1523 Last data filed at 05/24/13 1400  Gross per 24 hour  Intake    850 ml  Output      0 ml  Net    850 ml    Labs:   Recent Labs Lab 05/22/13 0745 05/22/13 1510 05/23/13 0350  NA 137   --  135  K 3.1*  --  3.5  CL 99  --  103  CO2 27  --  26  BUN 15  --  16  CREATININE 0.82 0.80 0.81  CALCIUM 9.8  --  9.1  GLUCOSE 154*  --  120*    CBG (last 3)   Recent Labs  05/24/13 1218 05/24/13 1320 05/24/13 1426  GLUCAP 90 90 112*    Scheduled Meds: . enoxaparin (LOVENOX) injection  40 mg Subcutaneous Q24H  . gabapentin  300 mg Oral TID  . hydrochlorothiazide  25 mg Oral Daily  . insulin aspart  0-9 Units Subcutaneous TID WC  . insulin aspart  10 Units Subcutaneous TID WC  . insulin glargine  20 Units Subcutaneous QHS  . lisinopril  20 mg Oral Daily    Continuous Infusions: . sodium chloride 75 mL/hr at 05/24/13 0416    Past Medical History  Diagnosis Date  . Diabetes mellitus without complication   . Hypertension   . Depression   . Anxiety   . Hyperlipidemia   . Pancreatitis   . GERD (gastroesophageal reflux disease)     Past Surgical History  Procedure Laterality Date  . Appendectomy      Maureen Chatters, RD, LDN Pager #: [phone] After-Hours Pager #: [phone]

## 2013-05-24 NOTE — Progress Notes (Signed)
Utilization review completed. Loraine Freid, RN, BSN. 

## 2013-05-24 NOTE — Progress Notes (Signed)
PATIENT DETAILS Name: [name] [name] Age: 57 y.o. Sex: female Date of Birth: [birth year] Admit Date: 05/22/2013 Admitting Physician Dewayne Shorter Levora Dredge, MD ZOX:[id number],[id number] A, MD  Subjective: Complaining of mild RUQ pain.  Questioning what causes pancreatic cancer.  Assessment/Plan: Principal Problem:   Obstructive jaundice -Suspected Pancreatic Head Neoplasm as seen on MRCP 5x3 cm.  Encases the portal vein. -CA-19-9 elevated at 200.7 -LFT's stable -Drs. Pyrtle and Christella Hartigan planning endoscopic biopsy.  Active Problems:   DM (diabetes mellitus) -CBG's stable -c/w Lantus 20 units and Novolog 10 units TID with meals    HTN (hypertension) -controlled with HCTZ and Lisinopril    Dyslipidemia -statins on hold    Hx of Chronic pancreatitis -?hx of prior ETOH pancreatitis -prn narcotics  Disposition: Remain inpatient  DVT Prophylaxis: Prophylactic Lovenox   Code Status: Full code   Family Communication  Procedures:  Pending ERCP  CONSULTS:  GI   MEDICATIONS: Scheduled Meds: . enoxaparin (LOVENOX) injection  40 mg Subcutaneous Q24H  . gabapentin  300 mg Oral TID  . hydrochlorothiazide  25 mg Oral Daily  . insulin aspart  0-9 Units Subcutaneous TID WC  . insulin aspart  10 Units Subcutaneous TID WC  . insulin glargine  20 Units Subcutaneous QHS  . lisinopril  20 mg Oral Daily   Continuous Infusions: . sodium chloride 75 mL/hr at 05/24/13 0416   PRN Meds:.acetaminophen, acetaminophen, albuterol, alum & mag hydroxide-simeth, morphine injection, ondansetron (ZOFRAN) IV, ondansetron, oxyCODONE  Antibiotics: Anti-infectives   None       PHYSICAL EXAM: Vital signs in last 24 hours: Filed Vitals:   05/23/13 0510 05/23/13 1431 05/23/13 2046 05/24/13 0526  BP: 167/100 158/85 174/78 151/74  Pulse: 76 74 70 59  Temp: 98.6 F (37 C) 97.8 F (36.6 C) 99.3 F (37.4 C) 99.2 F (37.3 C)  TempSrc: Oral Oral Oral Oral  Resp: 16 16 16 16   Height:      Weight:       SpO2: 97% 98% 97% 99%    Weight change:  Filed Weights   05/22/13 0657 05/22/13 1456  Weight: 71.668 kg (158 lb) 70.5 kg (155 lb 6.8 oz)   Body mass index is 27.54 kg/(m^2).   Gen Exam: Awake and alert with clear speech.   Neck: Supple, No JVD.   Chest: B/L Clear.   CVS: S1 S2 Regular, no murmurs.  Abdomen: soft, BS +, non distended, slightly tender to palpation in RUQ Extremities: no edema, lower extremities warm to touch. Neurologic: Non Focal.   Skin: No Rash.   Wounds: N/A.    Intake/Output from previous day:  Intake/Output Summary (Last 24 hours) at 05/24/13 0853 Last data filed at 05/23/13 1432  Gross per 24 hour  Intake    480 ml  Output      3 ml  Net    477 ml     LAB RESULTS: CBC  Recent Labs Lab 05/22/13 0745 05/22/13 1510 05/23/13 0350  WBC 7.6 7.4 6.3  HGB 13.5 12.2 11.6*  HCT 38.7 35.9* 34.7*  PLT 192 180 176  MCV 83.6 84.1 85.7  MCH 29.2 28.6 28.6  MCHC 34.9 34.0 33.4  RDW 14.5 14.6 15.1  LYMPHSABS 2.2  --   --   MONOABS 0.5  --   --   EOSABS 0.2  --   --   BASOSABS 0.0  --   --     Chemistries   Recent Labs Lab 05/22/13 0745 05/22/13 1510 05/23/13 0350  NA  137  --  135  K 3.1*  --  3.5  CL 99  --  103  CO2 27  --  26  GLUCOSE 154*  --  120*  BUN 15  --  16  CREATININE 0.82 0.80 0.81  CALCIUM 9.8  --  9.1    CBG:  Recent Labs Lab 05/23/13 0752 05/23/13 1233 05/23/13 1703 05/23/13 2217 05/24/13 0733  GLUCAP 118* 115* 141* 189* 151*    GFR Estimated Creatinine Clearance: 70.4 ml/min (by C-G formula based on Cr of 0.81).  Coagulation profile  Recent Labs Lab 05/22/13 1422  INR 0.97     Recent Labs  05/22/13 0745  LIPASE 88*    RADIOLOGY STUDIES/RESULTS: US Abdomen Complete  05/22/2013   *RADIOLOGY REPORT*  Clinical Data:  Abdominal pain.  Elevated liver function tests. Elevated bilirubin.  COMPLETE ABDOMINAL ULTRASOUND  Comparison:  05/22/2013 radiographs  Findings:  Gallbladder:  Gallbladder wall  thickening up to 5 mm.  Sonographic Murphy's sign absent.  Gallbladder sludge noted.  Common bile duct:  Abnormally prominent, measuring 1.2 cm in diameter, without directly visualized choledocholithiasis.  Liver:  Intrahepatic bile duct dilatation noted without focal hepatic lesion identified.  IVC:  Appears normal.  Pancreas:  Heterogeneous pancreatic head and body.  Possible pancreatic head mass.  This measures 4.1 x 4.1 x 3.0 cm.  Spleen:  Unremarkable, 5.0 cm craniocaudad.  Right Kidney:  Measures 11.2 cm in length and appears normal.  Left Kidney:  Measures 10.8 cm in length and contains a 1.8 cm lower pole cyst as well as a 8 mm linear echogenic structure along the renal pelvis which could represent a linear renal calculus.  Abdominal aorta:  No aneurysm identified.  Aortic bifurcation obscured by overlying bowel gas.  IMPRESSION:  1.  Abnormal intrahepatic bile duct dilatation with dilated common bile duct and heterogeneous pancreatic head suspicious for possible pancreatic mass, although localized pancreatitis could possibly have a similar sonographic appearance.  MRCP with and without contrast is recommended for further characterization.  If this is not feasible, consider a pancreatic protocol CT scan with and without contrast to assess for malignancy. 2.  Thickened gallbladder wall without pericholecystic fluid or sonographic Murphy's sign.  Correlate clinically in assessing for cholecystitis.  There is sludge in the gallbladder. 3.  Possible small linear left kidney lower pole calculus.   Original Report Authenticated By: Gaylyn Rong, M.D.   Dg Abd Acute W/chest  05/22/2013   *RADIOLOGY REPORT*  Clinical Data: Abdominal pain and back pain.  ACUTE ABDOMEN SERIES (ABDOMEN 2 VIEW & CHEST 1 VIEW)  Comparison: Lumbar radiographs dated 03/19/2013  Findings: There is borderline cardiomegaly.  Pulmonary vascularity is normal and the lungs are clear.  No free air or free fluid in the abdomen.  Normal  bowel gas pattern.  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  No acute osseous abnormality.  IMPRESSION: No acute abnormalities.   Original Report Authenticated By: Francene Boyers, M.D.    [place] [facility] Pager:[phone]  If 7PM-7AM, please contact night-coverage [email or web address] Password [email or web address] 05/24/2013, 8:53 AM   LOS: 2 days   Attending Patient seen and examined, agree with the assessment and plan as outlined above. Stable LFT's, ERCP with Stenting and brushing today  S Marizol Borror

## 2013-05-24 NOTE — Anesthesia Postprocedure Evaluation (Signed)
Anesthesia Post Note  Patient: [name] [name]  Procedure(s) Performed: Procedure(s) (LRB): ENDOSCOPIC RETROGRADE CHOLANGIOPANCREATOGRAPHY (ERCP) (N/A)  Anesthesia type: General  Patient location: PACU  Post pain: Pain level controlled  Post assessment: Patient's Cardiovascular Status Stable  Last Vitals:  Filed Vitals:   05/24/13 1427  BP: 147/80  Pulse: 60  Temp: 36.6 C  Resp: 18    Post vital signs: Reviewed and stable  Level of consciousness: alert  Complications: No apparent anesthesia complications

## 2013-05-24 NOTE — Op Note (Signed)
[facility] [address] [place], [address]   ERCP PROCEDURE REPORT  PATIENT: [name] [name], [name] [name]  MR# :[id number] BIRTHDATE: [birth year]  GENDER: Female ENDOSCOPIST: Rachael Fee, MD PROCEDURE DATE:  05/24/2013 PROCEDURE:   ERCP with stent placement ASA CLASS:   Class III INDICATIONS:jaundice, mild abd and back pain, weight loss; MRI shows 5cm mass in pancreatic head causing biliary obstruction. MEDICATIONS: General endotracheal anesthesia (GETA) TOPICAL ANESTHETIC: none  DESCRIPTION OF PROCEDURE:   After the risks benefits and alternatives of the procedure were thoroughly explained, informed consent was obtained.  The Pentax ERCP [id number]  endoscope was introduced through the mouth  and advanced to the second portion of the duodenum  without detailed examination of the UGI tract.  A 44 Autotome over a .035 hydrawire was used to cannulated the bile duct and dye was injected. There was a stricture with proximal edge at level of cystic duct takeoff (proximal  CBD). This was a tight stricture and appeared to continue to about 1cm above the major papilla (4-5cm long stricture).  The biliary tree proximal to the stricture was diffusely dilated. The cytic duct and gallbladder both opacified.  The malignant appearing stricture was sampled with bilary brush and then an 8.5Fr, 9cm long removable plastic stent was placed in good position bridging the stricture.  The main pancreatic duct was never cannulated with a wire or injected with dye. The scope was then completely withdrawn from the patient and the procedure terminated.     COMPLICATIONS: None  ENDOSCOPIC IMPRESSION: 4-5cm long bile duct stricture with proximal edge in proximal CBD (almost at level of cystic duct takeoff). This was brushed for cytology and then stented with an 8.5Fr 9cm long plastic biliary stent.  Given MRI findings, elevated CA 19-9 I suspect this represents locally advanced  pancreatic cancer.  RECOMMENDATIONS: OK to discharge home tomorrow morning.  If cancer is confirmed on cytology, then I will refer her for medical oncology, radiation oncology visits.  If cancer is NOT confirmed on cytology, then I will schedule her for endoscopic ultrasound with FNA to acquire more tissue, make diagnosis.    _______________________________ eSignedRachael Fee, MD 05/24/2013 1:16 PM

## 2013-05-24 NOTE — Preoperative (Signed)
Beta Blockers   Reason not to administer Beta Blockers:Not Applicable 

## 2013-05-24 NOTE — Transfer of Care (Signed)
Immediate Anesthesia Transfer of Care Note  Patient: [name] [name]  Procedure(s) Performed: Procedure(s) with comments: ENDOSCOPIC RETROGRADE CHOLANGIOPANCREATOGRAPHY (ERCP) (N/A) - MAC vs. general per anesthesia   Patient Location: Endoscopy Unit  Anesthesia Type:General  Level of Consciousness: awake, alert  and patient cooperative  Airway & Oxygen Therapy: Patient Spontanous Breathing and Patient connected to nasal cannula oxygen  Post-op Assessment: Report given to PACU RN and Post -op Vital signs reviewed and stable  Post vital signs: Reviewed and stable  Complications: No apparent anesthesia complications

## 2013-05-25 ENCOUNTER — Telehealth: Payer: Self-pay | Admitting: *Deleted

## 2013-05-25 ENCOUNTER — Telehealth: Payer: Self-pay

## 2013-05-25 ENCOUNTER — Encounter (HOSPITAL_COMMUNITY): Payer: Self-pay | Admitting: Gastroenterology

## 2013-05-25 DIAGNOSIS — C259 Malignant neoplasm of pancreas, unspecified: Secondary | ICD-10-CM

## 2013-05-25 LAB — COMPREHENSIVE METABOLIC PANEL
AST: 121 U/L — ABNORMAL HIGH (ref 0–37)
Albumin: 3.2 g/dL — ABNORMAL LOW (ref 3.5–5.2)
BUN: 10 mg/dL (ref 6–23)
Calcium: 9.4 mg/dL (ref 8.4–10.5)
Creatinine, Ser: 0.77 mg/dL (ref 0.50–1.10)
Total Bilirubin: 2.2 mg/dL — ABNORMAL HIGH (ref 0.3–1.2)
Total Protein: 7.2 g/dL (ref 6.0–8.3)

## 2013-05-25 LAB — GLUCOSE, CAPILLARY: Glucose-Capillary: 121 mg/dL — ABNORMAL HIGH (ref 70–99)

## 2013-05-25 LAB — CBC
HCT: 36.2 % (ref 36.0–46.0)
MCH: 28.3 pg (ref 26.0–34.0)
MCHC: 33.4 g/dL (ref 30.0–36.0)
MCV: 84.8 fL (ref 78.0–100.0)
Platelets: 194 10*3/uL (ref 150–400)
RDW: 15.1 % (ref 11.5–15.5)
WBC: 6.9 10*3/uL (ref 4.0–10.5)

## 2013-05-25 MED ORDER — POTASSIUM CHLORIDE CRYS ER 20 MEQ PO TBCR
40.0000 meq | EXTENDED_RELEASE_TABLET | Freq: Once | ORAL | Status: AC
  Administered 2013-05-25: 40 meq via ORAL
  Filled 2013-05-25: qty 2

## 2013-05-25 MED ORDER — AMLODIPINE BESYLATE 10 MG PO TABS
10.0000 mg | ORAL_TABLET | Freq: Every day | ORAL | Status: DC
  Administered 2013-05-25: 10 mg via ORAL
  Filled 2013-05-25: qty 1

## 2013-05-25 MED ORDER — HYDROCODONE-ACETAMINOPHEN 5-325 MG PO TABS: 1.0000 | ORAL_TABLET | Freq: Four times a day (QID) | ORAL | Status: DC | PRN

## 2013-05-25 MED ORDER — ENSURE COMPLETE PO LIQD: 237.0000 mL | Freq: Every day | ORAL | Status: DC | PRN

## 2013-05-25 MED ORDER — AMLODIPINE BESYLATE 10 MG PO TABS: 10.0000 mg | ORAL_TABLET | Freq: Every day | ORAL | Status: DC

## 2013-05-25 MED ORDER — LISINOPRIL 20 MG PO TABS: 20.0000 mg | ORAL_TABLET | Freq: Every day | ORAL | Status: DC

## 2013-05-25 MED ORDER — OXYCODONE HCL 5 MG PO TABS: 5.0000 mg | ORAL_TABLET | ORAL | Status: DC | PRN

## 2013-05-25 NOTE — Telephone Encounter (Signed)
Message copied by Donata Duff on Tue May 25, 2013 12:46 PM ------      Message from: Rachael Fee      Created: Tue May 25, 2013 12:12 PM       Wilmot Quevedo,            She needs referrals to medical and radiation oncology for newly diagnosed, locally advanced pancreatic adenocarcinoma.  She is being discharged today. Also she needs rov with me in 2-3 months to discuss repeat ERCP, change stent.            Almira Coaster,      Can you add her name to next week's GI cancer conference            Thanks             ------

## 2013-05-25 NOTE — Discharge Summary (Signed)
Physician Discharge Summary  [name] [name] UJW:[id number] DOB: 02-21-1956 DOA: 05/22/2013  PCP: Dorrene German, MD  Admit date: 05/22/2013 Discharge date: 05/25/2013  Time spent: 45 minutes  Recommendations for Outpatient Follow-up:  Check BMET and Blood Pressure medications with PCP in 1 week. Follow up with Dr. Christella Hartigan of [facility] for cytology results and next steps.  Discharge Diagnoses:  Principal Problem:   Obstructive jaundice Active Problems:   DM (diabetes mellitus)   HTN (hypertension)   Dyslipidemia   Pancreatic mass   Discharge Condition: stable.    Diet recommendation: low fat, small frequent meals.  Filed Weights   05/22/13 0657 05/22/13 1456  Weight: 71.668 kg (158 lb) 70.5 kg (155 lb 6.8 oz)    History of present illness at the time of admission:  [name] [name] is a 57 y.o. female with a Past Medical History of chronic alcoholic pancreatitis, diabetes, hypertension, prior history of CVA who presented with right upper quadrant pain. The patient  has had chronic abdominal pain for the past number of years. She claims that she had several flares up pancreatitis over the past few years. She attributes her chronic abdominal pain to chronic pancreatitis. However over the past 3 days prior to admission, she's had slight worsening of chronic abdominal pain, she's also had a few episodes of vomiting. The vomitus is nonbloody. Approximately 3 days ago as well, she noticed a discoloration of her urine- "was dark". She then proceeded to come to the emergency room , she was found to have obstructive jaundice and transaminitis, the ultrasound of the abdomen showed a possible pancreatic head malignancy.   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 Encases the porto-splenic confluence and the portal vein.  -CA-19-9 elevated at 200.7  -Dr. Christella Hartigan performed ERCP with brushing for cytology, as well as CBD stent placement. -Patient  does not appear to have any adverse effects from ERCP, jaundice and LFTs have both improved. -Dr. Jerral Ralph sat with the patient and her daughter prior to discharge to clearly explain the probable diagnosis of pancreatic cancer - and attempted to ensure that the patient was not lost to medical follow up. -Dr. Christella Hartigan will determine the next steps (out patient) once her cytology results have returned.  DM (diabetes mellitus)  -CBG's stable  -c/w Lantus 20 units and Novolog 10 units TID with meals   HTN (hypertension)  -controlled with HCTZ and Lisinopril prior to admission -will discontinue HCTZ (given adverse effects on the pancreas) and added amlodipine at the time of discharge.  Dyslipidemia  -statins have been discontinued.  Hx of Chronic pancreatitis  -?hx of prior ETOH pancreatitis  -prn narcotics  Procedures: ERCP with brushings and stent placement on 7/7  Consultations:  [facility]  Discharge Exam: Filed Vitals:   05/24/13 2100 05/25/13 0254 05/25/13 0521 05/25/13 0915  BP: 193/82 146/77 148/72 171/76  Pulse: 84  61   Temp: 98.1 F (36.7 C)  98.3 F (36.8 C)   TempSrc: Oral  Oral   Resp: 18  18   Height:      Weight:      SpO2: 98%  96%     General: Awake and alert with clear speech.  Icterus significantly improved. Cardiovascular: rrr no m/r/g no lower extremity edema Respiratory: cta no w/c/r Abdomen:  Soft, minimally tender in the epigastrium to palpation, +Bs,  Extremities:  Able to move all 4 with 5/5 strength. Psych:  Appears slightly learning disabled,  normal affect, appropriate  Discharge Instructions      Discharge Orders   Future Orders Complete By Expires     Diet - low sodium heart healthy  As directed     Increase activity slowly  As directed         Medication List    STOP taking these medications       HYDROcodone-acetaminophen 5-325 MG per tablet  Commonly known as:  NORCO/VICODIN     lisinopril-hydrochlorothiazide 20-25 MG per  tablet  Commonly known as:  PRINZIDE,ZESTORETIC     potassium chloride 10 MEQ tablet  Commonly known as:  K-DUR,KLOR-CON     simvastatin 10 MG tablet  Commonly known as:  ZOCOR      TAKE these medications       amLODipine 10 MG tablet  Commonly known as:  NORVASC  Take 1 tablet (10 mg total) by mouth daily.     feeding supplement Liqd  Take 237 mLs by mouth daily as needed (poor PO intake).     gabapentin 300 MG capsule  Commonly known as:  NEURONTIN  Take 300 mg by mouth 3 (three) times daily.     insulin aspart 100 UNIT/ML injection  Commonly known as:  novoLOG  Inject 10 Units into the skin 3 (three) times daily with meals.     insulin glargine 100 UNIT/ML injection  Commonly known as:  LANTUS  Inject 20 Units into the skin at bedtime.     lisinopril 20 MG tablet  Commonly known as:  PRINIVIL,ZESTRIL  Take 1 tablet (20 mg total) by mouth daily.     oxyCODONE 5 MG immediate release tablet  Commonly known as:  ROXICODONE  Take 1 tablet (5 mg total) by mouth every 4 (four) hours as needed for pain.       Allergies  Allergen Reactions  . Betadine (Povidone Iodine) Rash   Follow-up Information   Follow up with Fleet Contras A, MD. Schedule an appointment as soon as possible for a visit on 05/31/2013. (walk in-either between 9 am and 11 am, or between 2 pm and 3:30 pm)    Contact information:   [address] [place] [address] [phone]       The results of significant diagnostics from this hospitalization (including imaging, microbiology, ancillary and laboratory) are listed below for reference.    Significant Diagnostic Studies: US Abdomen Complete  05/22/2013   *RADIOLOGY REPORT*  Clinical Data:  Abdominal pain.  Elevated liver function tests. Elevated bilirubin.  COMPLETE ABDOMINAL ULTRASOUND  Comparison:  05/22/2013 radiographs  Findings:  Gallbladder:  Gallbladder wall thickening up to 5 mm.  Sonographic Murphy's sign absent.  Gallbladder sludge  noted.  Common bile duct:  Abnormally prominent, measuring 1.2 cm in diameter, without directly visualized choledocholithiasis.  Liver:  Intrahepatic bile duct dilatation noted without focal hepatic lesion identified.  IVC:  Appears normal.  Pancreas:  Heterogeneous pancreatic head and body.  Possible pancreatic head mass.  This measures 4.1 x 4.1 x 3.0 cm.  Spleen:  Unremarkable, 5.0 cm craniocaudad.  Right Kidney:  Measures 11.2 cm in length and appears normal.  Left Kidney:  Measures 10.8 cm in length and contains a 1.8 cm lower pole cyst as well as a 8 mm linear echogenic structure along the renal pelvis which could represent a linear renal calculus.  Abdominal aorta:  No aneurysm identified.  Aortic bifurcation obscured by overlying bowel gas.  IMPRESSION:  1.  Abnormal intrahepatic bile duct dilatation with dilated  common bile duct and heterogeneous pancreatic head suspicious for possible pancreatic mass, although localized pancreatitis could possibly have a similar sonographic appearance.  MRCP with and without contrast is recommended for further characterization.  If this is not feasible, consider a pancreatic protocol CT scan with and without contrast to assess for malignancy. 2.  Thickened gallbladder wall without pericholecystic fluid or sonographic Murphy's sign.  Correlate clinically in assessing for cholecystitis.  There is sludge in the gallbladder. 3.  Possible small linear left kidney lower pole calculus.   Original Report Authenticated By: Gaylyn Rong, M.D.   Mr 3d Recon At Scanner  05/23/2013   *RADIOLOGY REPORT*  Clinical Data:  Biliary dilatation, suspected pancreatic cancer on ultrasound  MRI ABDOMEN WITHOUT AND WITH CONTRAST (INCLUDING MRCP)  Technique:  Multiplanar multisequence MR imaging of the abdomen was performed both before and after the administration of intravenous contrast. Heavily T2-weighted images of the biliary and pancreatic ducts were obtained, and three-dimensional  MRCP images were rendered by post processing.  Contrast: 15 ml Multihance IV  Comparison:  Ultrasound abdomen dated 05/22/2013  Findings:  Liver is notable for moderate intrahepatic ductal dilatation.  No suspicious/enhancing hepatic lesions.  No hepatic steatosis.  Spleen and adrenal glands are within normal limits.  Gallbladder is mildly thick-walled but otherwise unremarkable. Dilated CBD, measuring 10 mm, with abrupt cutoff at the level of the pancreatic head.  3.1 x 5.3 cm hypoenhancing mass at the junction of the pancreatic head/body (series 81/image 50), highly suspicious for pancreatic cancer.  Atrophy of the distal pancreatic body/tail.  Associated dilatation of the main pancreatic duct in the pancreatic body/tail, with abrupt cutoff at the level of the mass.  Although the SMA and portal vein remain patent (series 1802/images 48 57), the mass narrows/encases the portosplenic confluence and the portal vein (series 1802/images 47 and 53). SMA is uninvolved. Tumor may abut the undersurface of the celiac axis (series 81/image 44).  1.7 x 1.7 cm anterior interpolar left renal cyst (series 4/image 20).  Right kidneys within normal limits.  No hydronephrosis.  No abdominal ascites.  No definite abdominal lymphadenopathy.  No focal osseous lesions.  IMPRESSION: 3.1 x 5.3 cm mass at the junction of the pancreatic head/body, highly suspicious for pancreatic cancer.  Consider EUS with tissue sampling for confirmation.  Tumor involves/encases the portosplenic confluence and portal vein, which remains patent.  Tumor may abut the undersurface of the celiac axis.  Secondary intrahepatic/extrahepatic ductal dilatation.  CBD measures 10 mm.   Original Report Authenticated By: Charline Bills, M.D.   Dg Ercp  05/24/2013   *RADIOLOGY REPORT*  Clinical Data: Biliary obstruction.  ERCP  Comparison: MRCP dated 05/22/2013  Findings: Multiple images from ERCP demonstrate dilatation of the biliary tree with a mass compressing  the distal common bile duct consistent with the tumor seen in the head of the pancreas. Biliary stent has been inserted on the last image.  IMPRESSION: Mass obstructing the distal common bile duct.  Stent placed.   Original Report Authenticated By: Francene Boyers, M.D.   Dg Abd Acute W/chest  05/22/2013   *RADIOLOGY REPORT*  Clinical Data: Abdominal pain and back pain.  ACUTE ABDOMEN SERIES (ABDOMEN 2 VIEW & CHEST 1 VIEW)  Comparison: Lumbar radiographs dated 03/19/2013  Findings: There is borderline cardiomegaly.  Pulmonary vascularity is normal and the lungs are clear.  No free air or free fluid in the abdomen.  Normal bowel gas pattern.  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  No acute osseous abnormality.  IMPRESSION: No acute abnormalities.   Original Report Authenticated By: Francene Boyers, M.D.   Mr Abd W/wo Cm/mrcp  05/23/2013   *RADIOLOGY REPORT*  Clinical Data:  Biliary dilatation, suspected pancreatic cancer on ultrasound  MRI ABDOMEN WITHOUT AND WITH CONTRAST (INCLUDING MRCP)  Technique:  Multiplanar multisequence MR imaging of the abdomen was performed both before and after the administration of intravenous contrast. Heavily T2-weighted images of the biliary and pancreatic ducts were obtained, and three-dimensional MRCP images were rendered by post processing.  Contrast: 15 ml Multihance IV  Comparison:  Ultrasound abdomen dated 05/22/2013  Findings:  Liver is notable for moderate intrahepatic ductal dilatation.  No suspicious/enhancing hepatic lesions.  No hepatic steatosis.  Spleen and adrenal glands are within normal limits.  Gallbladder is mildly thick-walled but otherwise unremarkable. Dilated CBD, measuring 10 mm, with abrupt cutoff at the level of the pancreatic head.  3.1 x 5.3 cm hypoenhancing mass at the junction of the pancreatic head/body (series 81/image 50), highly suspicious for pancreatic cancer.  Atrophy of the distal pancreatic body/tail.  Associated dilatation of the main  pancreatic duct in the pancreatic body/tail, with abrupt cutoff at the level of the mass.  Although the SMA and portal vein remain patent (series 1802/images 48 57), the mass narrows/encases the portosplenic confluence and the portal vein (series 1802/images 47 and 53). SMA is uninvolved. Tumor may abut the undersurface of the celiac axis (series 81/image 44).  1.7 x 1.7 cm anterior interpolar left renal cyst (series 4/image 20).  Right kidneys within normal limits.  No hydronephrosis.  No abdominal ascites.  No definite abdominal lymphadenopathy.  No focal osseous lesions.  IMPRESSION: 3.1 x 5.3 cm mass at the junction of the pancreatic head/body, highly suspicious for pancreatic cancer.  Consider EUS with tissue sampling for confirmation.  Tumor involves/encases the portosplenic confluence and portal vein, which remains patent.  Tumor may abut the undersurface of the celiac axis.  Secondary intrahepatic/extrahepatic ductal dilatation.  CBD measures 10 mm.   Original Report Authenticated By: Charline Bills, M.D.      Labs: Basic Metabolic Panel:  Recent Labs Lab 05/22/13 0745 05/22/13 1510 05/23/13 0350 05/25/13 0507  NA 137  --  135 135  K 3.1*  --  3.5 3.2*  CL 99  --  103 99  CO2 27  --  26 26  GLUCOSE 154*  --  120* 159*  BUN 15  --  16 10  CREATININE 0.82 0.80 0.81 0.77  CALCIUM 9.8  --  9.1 9.4   Liver Function Tests:  Recent Labs Lab 05/22/13 0745 05/22/13 1422 05/23/13 0350 05/24/13 0457 05/25/13 0507  AST 228* 206* 161* 178* 121*  ALT 409* 361* 292* 315* 259*  ALKPHOS 425* 391* 372* 408* 376*  BILITOT 5.4* 5.0* 4.6* 5.0* 2.2*  PROT 8.3 7.5 6.8 7.7 7.2  ALBUMIN 4.2 3.7 3.1* 3.5 3.2*    Recent Labs Lab 05/22/13 0745  LIPASE 88*   CBC:  Recent Labs Lab 05/22/13 0745 05/22/13 1510 05/23/13 0350 05/25/13 0507  WBC 7.6 7.4 6.3 6.9  NEUTROABS 4.6  --   --   --   HGB 13.5 12.2 11.6* 12.1  HCT 38.7 35.9* 34.7* 36.2  MCV 83.6 84.1 85.7 84.8  PLT 192 180 176  194   CBG:  Recent Labs Lab 05/24/13 1320 05/24/13 1426 05/24/13 1651 05/24/13 2140 05/25/13 0737  GLUCAP 90 112* 204* 223* 121*   Signed:  Michele Rockers, PA-C  [facility] 05/25/2013, 11:47 AM  Attending Patient seen and examined,agree with the assessment and plan as outlined above, LFT's better with ERCP and stenting, Brushing bx still pending, GI will follow results of ERCP, if inconclusive, they will refer for EUS and Oncology accordingly. I have held a family meeting with daughter with patient included-and have educated them about the diagnosis, pending bx results, further procedures and follow up etc. Stable for discharge with outpatient GI follow up  S Baleigh Rennaker

## 2013-05-25 NOTE — Telephone Encounter (Signed)
Referral and follow up has been made

## 2013-05-25 NOTE — Progress Notes (Signed)
Pt. discharged to floor,verbalized understanding of discharged instruction,medication,restriction,diet and follow up appointment.Baseline Vitals sign stable,Pt comfortable,no sign and symptom of distress. 

## 2013-05-25 NOTE — Care Management Note (Signed)
    Page 1 of 1   05/25/2013     2:29:01 PM   CARE MANAGEMENT NOTE 05/25/2013  Patient:  [name] [name], [name] [name]   Account Number:  [id number]  Date Initiated:  05/25/2013  Documentation initiated by:  Letha Cape  Subjective/Objective Assessment:   dx obstructive jaundice  admit- lives alone.  pta indep.  Daugher Jac Canavan [phone].  Patient's phone is [phone].     Action/Plan:   Anticipated DC Date:  05/25/2013   Anticipated DC Plan:  HOME/SELF CARE      DC Planning Services  CM consult      Choice offered to / List presented to:             Status of service:  Completed, signed off Medicare Important Message given?   (If response is "NO", the following Medicare IM given date fields will be blank) Date Medicare IM given:   Date Additional Medicare IM given:    Discharge Disposition:  HOME/SELF CARE  Per UR Regulation:  Reviewed for med. necessity/level of care/duration of stay  If discussed at Long Length of Stay Meetings, dates discussed:    Comments:  05/25/13 14:23 Letha Cape RN, BSN [phone] patient lives alone, her daughter can come over to help her out as well.  Patient is for dc today.  Daughter was asking about a letter from MD to give to housing authority for [place] to say she needs to move into mothers apartment complex building in order to care for her, per MD patient will need to get this from her pcp, Dr. Marliss Coots.  I notified Sunny Schlein NCM for [facility], she stated they are following this patient and she will contact her to let her know who is the  medicaide Case Worker for her to contact.  Pateint has transportation at dc.

## 2013-05-25 NOTE — Telephone Encounter (Signed)
Spoke with patient by phone and confirmed appointments with Dr. Mitzi Hansen and Dr. Truett Perna.  Contact names and phone numbers were provided.

## 2013-05-25 NOTE — Progress Notes (Signed)
[name] [name] Rounding Note 05/25/2013, 10:02 AM  SUBJECTIVE:       Still has epigastric pain.  No nausea.  Eating small meals.  Overall feels better.   Wants to know if she can switch primary care MD  OBJECTIVE:         Vital signs in last 24 hours:    Temp:  [96.7 F (35.9 C)-98.3 F (36.8 C)] 98.3 F (36.8 C) (07/08 0521) Pulse Rate:  [60-84] 61 (07/08 0521) Resp:  [9-18] 18 (07/08 0521) BP: (129-193)/(70-82) 171/76 mmHg (07/08 0915) SpO2:  [96 %-100 %] 96 % (07/08 0521) Last BM Date: 05/23/13 General: overweight, alert, comfortable.  Does not look ill.    Heart: RRR Chest: clear bil.  No dyspnea or cough Abdomen: soft, slight epigastric tenderness.  Extremities: no CCE Neuro/Psych:  Pleasant, cooperative.  Not confused.   Intake/Output from previous day: 07/07 0701 - 07/08 0700 In: 850 [I.V.:850] Out: 3 [Urine:2; Stool:1]  Intake/Output this shift:    Lab Results:  Recent Labs  05/22/13 1510 05/23/13 0350 05/25/13 0507  WBC 7.4 6.3 6.9  HGB 12.2 11.6* 12.1  HCT 35.9* 34.7* 36.2  PLT 180 176 194   BMET  Recent Labs  05/22/13 1510 05/23/13 0350 05/25/13 0507  NA  --  135 135  K  --  3.5 3.2*  CL  --  103 99  CO2  --  26 26  GLUCOSE  --  120* 159*  BUN  --  16 10  CREATININE 0.80 0.81 0.77  CALCIUM  --  9.1 9.4   LFT  Recent Labs  05/22/13 1422 05/23/13 0350 05/24/13 0457 05/25/13 0507  PROT 7.5 6.8 7.7 7.2  ALBUMIN 3.7 3.1* 3.5 3.2*  AST 206* 161* 178* 121*  ALT 361* 292* 315* 259*  ALKPHOS 391* 372* 408* 376*  BILITOT 5.0* 4.6* 5.0* 2.2*  BILIDIR 3.7* 3.8* 3.6*  --   IBILI 1.3* 0.8 1.4*  --    PT/INR  Recent Labs  05/22/13 1422  LABPROT 12.7  INR 0.97   Hepatitis Panel  Recent Labs  05/22/13 1020  HEPBSAG NEGATIVE  HCVAB NEGATIVE  HEPAIGM NEGATIVE  HEPBIGM NEGATIVE   CA 19-9    200.7  Studies/Results: Dg Ercp 05/24/2013   *RADIOLOGY REPORT*  Clinical Data: Biliary obstruction.  ERCP  Comparison: MRCP dated  05/22/2013  Findings: Multiple images from ERCP demonstrate dilatation of the biliary tree with a mass compressing the distal common bile duct consistent with the tumor seen in the head of the pancreas. Biliary stent has been inserted on the last image.  IMPRESSION: Mass obstructing the distal common bile duct.  Stent placed.   Original Report Authenticated By: Francene Boyers, M.D.    ASSESMENT: *  Obstructive jaundice.  Mass at head of pancreas, l, CBD and intrahepatic ductal dilation on MRI/MRCP.  Tumor abuts, but not yet invading the porto-splenic vasculature.  CA 19-9: 200. ERCP with CBD stricture, likely malignant.  Stent placement and CBD brushings 05/24/13.   Pain in epigastrium to back, may not be relieved by stenting.  she has hx of ETOH pancreatitis and chronic abdominal pain.  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  *  IDDM *  HTN    PLAN: *  Await cytology, if does not confirm cancer, will need to be scheduled for EUS with FNA as outpt.  *  Once tissue diagnoses is confirmed, will then make referral to the  cancer center.   Pertinent phone #s are Daughter [name] [name] House [phone] Patient:      [phone]    LOS: 3 days   [name] [name]  05/25/2013, 10:02 AM Pager: [phone]  ________________________________________________________________________  [facility] MD note:  I personally examined the patient, reviewed the data and agree with the assessment and plan described above.  Pathology just called me.  ++adenocarcinoma.  I discussed this with patient and family in the room.  My office will set up referrals to medical, radiation oncology.  She will need return visit with me in office in 2-3 months to consider repeat ERCP, change stent.   Rob Bunting, MD [facility] Pager [phone]

## 2013-05-28 ENCOUNTER — Emergency Department (HOSPITAL_COMMUNITY): Payer: Medicaid Other

## 2013-05-28 ENCOUNTER — Emergency Department (HOSPITAL_COMMUNITY)
Admission: EM | Admit: 2013-05-28 | Discharge: 2013-05-29 | Disposition: A | Discharge status: Home / Self Care | Payer: Medicaid Other | Attending: Emergency Medicine | Admitting: Emergency Medicine

## 2013-05-28 ENCOUNTER — Encounter (HOSPITAL_COMMUNITY): Payer: Self-pay | Admitting: Adult Health

## 2013-05-28 DIAGNOSIS — F411 Generalized anxiety disorder: Secondary | ICD-10-CM | POA: Insufficient documentation

## 2013-05-28 DIAGNOSIS — Z794 Long term (current) use of insulin: Secondary | ICD-10-CM | POA: Insufficient documentation

## 2013-05-28 DIAGNOSIS — C259 Malignant neoplasm of pancreas, unspecified: Secondary | ICD-10-CM | POA: Insufficient documentation

## 2013-05-28 DIAGNOSIS — R109 Unspecified abdominal pain: Secondary | ICD-10-CM | POA: Insufficient documentation

## 2013-05-28 DIAGNOSIS — E785 Hyperlipidemia, unspecified: Secondary | ICD-10-CM | POA: Insufficient documentation

## 2013-05-28 DIAGNOSIS — Z8719 Personal history of other diseases of the digestive system: Secondary | ICD-10-CM | POA: Insufficient documentation

## 2013-05-28 DIAGNOSIS — Z8659 Personal history of other mental and behavioral disorders: Secondary | ICD-10-CM | POA: Insufficient documentation

## 2013-05-28 DIAGNOSIS — E119 Type 2 diabetes mellitus without complications: Secondary | ICD-10-CM | POA: Insufficient documentation

## 2013-05-28 DIAGNOSIS — Z79899 Other long term (current) drug therapy: Secondary | ICD-10-CM | POA: Insufficient documentation

## 2013-05-28 DIAGNOSIS — I10 Essential (primary) hypertension: Secondary | ICD-10-CM | POA: Insufficient documentation

## 2013-05-28 DIAGNOSIS — F17200 Nicotine dependence, unspecified, uncomplicated: Secondary | ICD-10-CM | POA: Insufficient documentation

## 2013-05-28 DIAGNOSIS — R110 Nausea: Secondary | ICD-10-CM | POA: Insufficient documentation

## 2013-05-28 LAB — COMPREHENSIVE METABOLIC PANEL
ALT: 138 U/L — ABNORMAL HIGH (ref 0–35)
AST: 49 U/L — ABNORMAL HIGH (ref 0–37)
Alkaline Phosphatase: 319 U/L — ABNORMAL HIGH (ref 39–117)
CO2: 30 mEq/L (ref 19–32)
Calcium: 10.2 mg/dL (ref 8.4–10.5)
Chloride: 100 mEq/L (ref 96–112)
GFR calc Af Amer: 84 mL/min — ABNORMAL LOW (ref >90)
GFR calc non Af Amer: 73 mL/min — ABNORMAL LOW (ref >90)
Glucose, Bld: 200 mg/dL — ABNORMAL HIGH (ref 70–99)
Potassium: 4.4 mEq/L (ref 3.5–5.1)
Sodium: 139 mEq/L (ref 135–145)
Total Bilirubin: 1.2 mg/dL (ref 0.3–1.2)

## 2013-05-28 LAB — CBC WITH DIFFERENTIAL/PLATELET
Basophils Absolute: 0 10*3/uL (ref 0.0–0.1)
Basophils Relative: 0 % (ref 0–1)
Eosinophils Absolute: 0.3 10*3/uL (ref 0.0–0.7)
Hemoglobin: 13 g/dL (ref 12.0–15.0)
MCH: 29.5 pg (ref 26.0–34.0)
MCHC: 34.3 g/dL (ref 30.0–36.0)
Monocytes Relative: 6 % (ref 3–12)
Neutro Abs: 5.5 10*3/uL (ref 1.7–7.7)
Neutrophils Relative %: 62 % (ref 43–77)
RDW: 15.3 % (ref 11.5–15.5)

## 2013-05-28 LAB — POCT I-STAT TROPONIN I

## 2013-05-28 LAB — LIPASE, BLOOD: Lipase: 128 U/L — ABNORMAL HIGH (ref 11–59)

## 2013-05-28 MED ORDER — IOHEXOL 300 MG/ML  SOLN
100.0000 mL | Freq: Once | INTRAMUSCULAR | Status: AC | PRN
  Administered 2013-05-28: 100 mL via INTRAVENOUS

## 2013-05-28 MED ORDER — ONDANSETRON HCL 4 MG/2ML IJ SOLN
4.0000 mg | Freq: Once | INTRAMUSCULAR | Status: AC
  Administered 2013-05-28: 4 mg via INTRAVENOUS
  Filled 2013-05-28: qty 2

## 2013-05-28 MED ORDER — HYDROMORPHONE HCL PF 1 MG/ML IJ SOLN
1.0000 mg | Freq: Once | INTRAMUSCULAR | Status: AC
  Administered 2013-05-28: 1 mg via INTRAVENOUS
  Filled 2013-05-28: qty 1

## 2013-05-28 MED ORDER — IOHEXOL 300 MG/ML  SOLN
25.0000 mL | Freq: Once | INTRAMUSCULAR | Status: AC | PRN
  Administered 2013-05-28: 25 mL via ORAL

## 2013-05-28 NOTE — Progress Notes (Signed)
GI Location of Tumor / Histology: pancreatic head mass - adenocarcinoma  Patient presented with symptoms of: worsening of abdominal pain and vomiting.  Also had dark urine.  Brushings of common bile duct (if applicable) revealed: COMMON BILE DUCT BRUSHING: MALIGNANT CELLS PRESENT CONSISTENT WITH ADENOCARCINOMA.  Past/Anticipated interventions by surgeon, if any: ERCP with CBD stent placement 05/24/2013  Past/Anticipated interventions by medical oncology, if any: unknown  Weight changes, if any: 20 lbs loss in 3 months  Bowel/Bladder complaints, if any: no  Nausea / Vomiting, if any: no  Pain issues, if any:  Pain in her abdomen radiating to her back  SAFETY ISSUES:  Prior radiation? no  Pacemaker/ICD? no  Possible current pregnancy? no  Is the patient on methotrexate? no  Current Complaints / other details:  [name] [name] here for consult for pancreatic cancer with her daughter.

## 2013-05-28 NOTE — ED Notes (Addendum)
2110  Introduced self to the pt and started IV and give meds.  Pt is calm and cooperative at this time and I will continue to monitor the pt  2320  Pt finished oral contrast and called CT to let them know  0012  Pt back from CT and is sleeping at this time.  No distress noted.  WIll continue to monitor

## 2013-05-28 NOTE — ED Notes (Signed)
Family at bedside. 

## 2013-05-28 NOTE — ED Notes (Addendum)
Pt presents with upper bilateral abdominal pain that radiates to chest. Pt was Dx with pancreatic cancer one week ago and reports weight gain and abdominal swelling. Pain is described as severe.  Also c/o knot on left flank area, nontender.

## 2013-05-29 MED ORDER — AMLODIPINE BESYLATE 10 MG PO TABS: 10.0000 mg | ORAL_TABLET | Freq: Every day | ORAL | Status: DC

## 2013-05-29 MED ORDER — OXYCODONE HCL 5 MG PO TABS: 5.0000 mg | ORAL_TABLET | ORAL | Status: DC | PRN

## 2013-05-29 MED ORDER — ONDANSETRON 4 MG PO TBDP: 4.0000 mg | ORAL_TABLET | Freq: Three times a day (TID) | ORAL | Status: DC | PRN

## 2013-05-29 NOTE — ED Provider Notes (Signed)
History    CSN: [id number] Arrival date & time 05/28/13  4098  First MD Initiated Contact with Patient 05/28/13 2029     Chief Complaint  Patient presents with  . Abdominal Pain   (Consider location/radiation/quality/duration/timing/severity/associated sxs/prior Treatment) HPI Comments: Patient presents with a chief complaint of abdominal pain.  Pain primarily located in the RUQ and epigastric area.  She reports that the pain has been present for the past 2-3 days and is gradually worsening.  She reports that the pain is similar to the pain that she has had in the past.  She was recently hospitalized on 05/22/13 and discharged on 05/25/13.  During that admission she was found to have a pancreatic mass and was diagnosed with Pancreatic Adenocarcinoma.  She was followed by [facility] during that admission.  She has an appointment scheduled with Oncology.  She had an ERCP done on 05/24/13 and had a biliary stent placed.  She reports that she began having this abdominal pain the day that she was discharged.  She reports some nausea, but denies vomiting.  Denies diarrhea or constipation.  Denies fever or chills.  She has been taking Oxycodone 5 mg for the pain, but does not feel that it is helping.  She denies chest pain, SOB, or syncope.     The history is provided by the patient.   Past Medical History  Diagnosis Date  . Diabetes mellitus without complication   . Hypertension   . Depression   . Anxiety   . Hyperlipidemia   . Pancreatitis   . GERD (gastroesophageal reflux disease)    Past Surgical History  Procedure Laterality Date  . Appendectomy    . Ercp N/A 05/24/2013    Procedure: ENDOSCOPIC RETROGRADE CHOLANGIOPANCREATOGRAPHY (ERCP);  Surgeon: Rachael Fee, MD;  Location: [facility] OR;  Service: Endoscopy;  Laterality: N/A;  MAC vs. general per anesthesia    Family History  Problem Relation Age of Onset  . Diabetes type II Mother   . Hypertension Mother   . Heart attack Father   .  HIV/AIDS Brother    History  Substance Use Topics  . Smoking status: Current Every Day Smoker -- 0.50 packs/day for 42 years  . Smokeless tobacco: Not on file  . Alcohol Use: No   OB History   Grav Para Term Preterm Abortions TAB SAB Ect Mult Living                 Review of Systems  Constitutional: Negative for fever and chills.  Respiratory: Negative for shortness of breath.   Cardiovascular: Negative for chest pain.  Gastrointestinal: Positive for nausea and abdominal pain. Negative for vomiting, diarrhea, constipation and abdominal distention.  Neurological: Negative for syncope.  All other systems reviewed and are negative.    Allergies  Betadine  Home Medications   Current Outpatient Rx  Name  Route  Sig  Dispense  Refill  . gabapentin (NEURONTIN) 300 MG capsule   Oral   Take 300 mg by mouth 3 (three) times daily.         . insulin aspart (NOVOLOG) 100 UNIT/ML injection   Subcutaneous   Inject 10 Units into the skin 3 (three) times daily with meals.         . insulin glargine (LANTUS) 100 UNIT/ML injection   Subcutaneous   Inject 20 Units into the skin at bedtime.         Marland Kitchen lisinopril (PRINIVIL,ZESTRIL) 20 MG tablet   Oral  Take 1 tablet (20 mg total) by mouth daily.   30 tablet   0   . oxyCODONE (ROXICODONE) 5 MG immediate release tablet   Oral   Take 1 tablet (5 mg total) by mouth every 4 (four) hours as needed for pain.   30 tablet   0   . potassium chloride (K-DUR,KLOR-CON) 10 MEQ tablet   Oral   Take 10 mEq by mouth daily.         . simvastatin (ZOCOR) 10 MG tablet   Oral   Take 10 mg by mouth at bedtime.          BP 148/70  Pulse 70  Temp(Src) 98 F (36.7 C) (Oral)  Resp 18  SpO2 99% Physical Exam  Nursing note and vitals reviewed. Constitutional: She appears well-developed and well-nourished.  HENT:  Head: Normocephalic and atraumatic.  Mouth/Throat: Oropharynx is clear and moist.  Neck: Normal range of motion. Neck  supple.  Cardiovascular: Normal rate, regular rhythm and normal heart sounds.   Pulmonary/Chest: Effort normal and breath sounds normal.  Abdominal: Soft. Normal appearance and bowel sounds are normal. She exhibits no distension and no mass. There is tenderness in the right upper quadrant and epigastric area. There is no rebound and no guarding.  Neurological: She is alert.  Skin: Skin is warm and dry.  Psychiatric: She has a normal mood and affect.    ED Course  Procedures (including critical care time) Labs Reviewed  LIPASE, BLOOD - Abnormal; Notable for the following:    Lipase 128 (*)    All other components within normal limits  COMPREHENSIVE METABOLIC PANEL - Abnormal; Notable for the following:    Glucose, Bld 200 (*)    Total Protein 8.6 (*)    AST 49 (*)    ALT 138 (*)    Alkaline Phosphatase 319 (*)    GFR calc non Af Amer 73 (*)    GFR calc Af Amer 84 (*)    All other components within normal limits  CBC WITH DIFFERENTIAL  AMMONIA  POCT I-STAT TROPONIN I   Ct Abdomen Pelvis W Contrast  05/29/2013   *RADIOLOGY REPORT*  Clinical Data: Upper abdominal pain radiating to the chest. Diagnosed 1 week ago with pancreatic cancer.  Weight gain with abdominal swelling.  CT ABDOMEN AND PELVIS WITH CONTRAST  Technique:  Multidetector CT imaging of the abdomen and pelvis was performed following the standard protocol during bolus administration of intravenous contrast.  Contrast: OMNIPAQUE IOHEXOL 300 MG/ML  SOLN  Comparison: MRI / MRCP 05/22/2013  Findings: Mild dependent atelectasis in the lung bases.  There is a poorly defined hypodense 3 x 4.3 cm diameter mass in the head of the pancreas with infiltration in the surrounding fat planes between the pancreas and duodenum and with partial encasement of the celiac axis.  Celiac axis lymphadenopathy. Proximal pancreatic ductal dilatation.  Changes are consistent with pancreatic carcinoma with local extension and local lymph node  metastasis.  A biliary stent is in place.  There is mild prominence of intrahepatic bile ducts.  There is residual contrast material filling the gallbladder and cystic duct.  This may result from previous ERCP on 05/24/2013 or could represent vicarious excretion. Cystic duct obstruction is not excluded.  The liver parenchyma appears homogeneous and no liver metastases are suggested.  Spleen size is normal.  Small accessory spleen.  No adrenal gland nodules. Cyst in the left kidney measures 16 mm diameter.  There is mild wall thickening.  No solid mass or hydronephrosis in the kidneys. Calcification of the abdominal aorta without aneurysm.  Inferior vena cava is normal in caliber.  The stomach and small bowel are not abnormally distended.  There is some wall thickening of the distal stomach and proximal duodenum which could be due to tumor infiltration or reactive inflammation.  Stool filled colon without distension.  No free air or free fluid in the abdomen.  Abdominal wall musculature appears intact.  Pelvis:  Calcification in the uterus consistent with fibroids.  No abnormal adnexal masses.  Bladder wall is not thickened.  Stool filled rectosigmoid colon without evidence of diverticulitis. Appendix is not identified.  The no free or loculated pelvic fluid collections.  No significant pelvic lymphadenopathy.  Degenerative changes in the spine.  No destructive or expansile bone lesions are appreciated.  IMPRESSION: Poorly defined mass in the head of the pancreas with extension into surrounding fat, partial encasement of the celiac axis, and with adjacent celiac axis and peripancreatic lymph nodes enlarged. Pancreatic ductal dilatation.  Biliary stent decompresses the bile ducts.  Residual contrast material filling the gallbladder and cystic duct. This may represent vicarious excretion, but cystic duct obstruction is not excluded.   Original Report Authenticated By: Burman Nieves, M.D.   No diagnosis found.    Date: 05/29/2013  Rate: 69  Rhythm: normal sinus rhythm  QRS Axis: normal  Intervals: normal  ST/T Wave abnormalities: nonspecific ST changes and nonspecific T wave changes  Conduction Disutrbances:none  Narrative Interpretation:   Brugada  Old EKG Reviewed: unchanged     12:41 AM Discussed the results of the CT scan with [facility]. He feels that her pain may be secondary to the pancreatic mass.  Patient can follow up with GI outpatient.  1:00 AM Dr. Manus Gunning discussed the EKG with Dr. Wilber Bihari with Cardiology who also evaluated the EKG.  MDM  Patient with a recent diagnosis of Pancreatic Adenocarcinoma presents today with RUQ and epigastric abdominal pain.  Pain similar to the pain that she has had in the past.  No rebound or guarding on exam.  Patient is afebrile.  CT results as above.  CT results discussed with [facility].  Pain and nausea managed in the ED.  Therefore, feel that the patient is stable for discharge.  Return precautions given.    Pascal Lux [place], PA-C 05/29/13 1817

## 2013-05-29 NOTE — ED Provider Notes (Signed)
Recent diagnosis of pancreatic CA presenting with worsening abdominal pain. Similar to previous and associated with nausea. No fever, SOB, chest pain, syncope. Abdomen soft with epigastric tenderness. EKG with Brugada pattern in v1 and v2, unchanged.  D/w Dr. Terressa Koyanagi of cardiology. No symptoms to suggest Brugada syndrome. He does not recommend further evaluation at this time especially given cancer diagnosis.  [name] Octave, MD 05/29/13 1110

## 2013-05-29 NOTE — ED Provider Notes (Signed)
Medical screening examination/treatment/procedure(s) were conducted as a shared visit with non-physician practitioner(s) and myself.  I personally evaluated the patient during the encounter  See my additional note  Glynn Octave, MD 05/29/13 2345

## 2013-05-31 ENCOUNTER — Ambulatory Visit
Admission: RE | Admit: 2013-05-31 | Discharge: 2013-05-31 | Disposition: A | Discharge status: Home / Self Care | Payer: Medicaid Other | Source: Ambulatory Visit | Attending: Radiation Oncology | Admitting: Radiation Oncology

## 2013-05-31 ENCOUNTER — Encounter: Payer: Self-pay | Admitting: Radiation Oncology

## 2013-05-31 ENCOUNTER — Telehealth: Payer: Self-pay | Admitting: *Deleted

## 2013-05-31 VITALS — BP 145/69 | HR 74 | Temp 98.5°F | Ht 63.0 in | Wt 155.9 lb

## 2013-05-31 DIAGNOSIS — K219 Gastro-esophageal reflux disease without esophagitis: Secondary | ICD-10-CM | POA: Insufficient documentation

## 2013-05-31 DIAGNOSIS — I10 Essential (primary) hypertension: Secondary | ICD-10-CM | POA: Insufficient documentation

## 2013-05-31 DIAGNOSIS — C259 Malignant neoplasm of pancreas, unspecified: Secondary | ICD-10-CM

## 2013-05-31 DIAGNOSIS — Z794 Long term (current) use of insulin: Secondary | ICD-10-CM | POA: Insufficient documentation

## 2013-05-31 DIAGNOSIS — Z79899 Other long term (current) drug therapy: Secondary | ICD-10-CM | POA: Insufficient documentation

## 2013-05-31 DIAGNOSIS — E785 Hyperlipidemia, unspecified: Secondary | ICD-10-CM | POA: Insufficient documentation

## 2013-05-31 DIAGNOSIS — E119 Type 2 diabetes mellitus without complications: Secondary | ICD-10-CM | POA: Insufficient documentation

## 2013-05-31 HISTORY — DX: Malignant neoplasm of pancreas, unspecified: C25.9

## 2013-05-31 NOTE — Telephone Encounter (Signed)
CALLED PATIENT TO INFORM OF TEST, LVM FOR A RETURN CALL 

## 2013-05-31 NOTE — Progress Notes (Signed)
Please see the Nurse Progress Note in the MD Initial Consult Encounter for this patient. 

## 2013-06-01 DIAGNOSIS — C250 Malignant neoplasm of head of pancreas: Secondary | ICD-10-CM | POA: Insufficient documentation

## 2013-06-01 NOTE — Progress Notes (Signed)
Radiation Oncology         [phone] ________________________________  Name: [name] [name] MRN: [id number]  Date: 05/31/2013  DOB: 23-Dec-1955  WU:[id number],NWGNF A, MD  Rachael Fee, MD     REFERRING PHYSICIAN: Rachael Fee, MD   DIAGNOSIS: The encounter diagnosis was Pancreatic cancer.   HISTORY OF PRESENT ILLNESS::[name] [name] is a 57 y.o. female who is seen for an initial consultation visit. The patient has a history of alcohol abuse and a reported history of chronic pancreatitis. She presented to the emergency room with upper abdominal pain as well as dark urine. She denies any alcohol intake for approximately one year. She does have some chronic upper abdominal pain but she states that this were sent for several days and was associated with nausea.  An abdominal ultrasound was obtained which showed a possible pancreatic mass measuring 4.1 cm. The patient's workup included an MRI scan of the abdomen which showed a 3.1 x 5.3 cm mass the junction of the pancreatic head/body. This was felt to be suspicious for pancreatic cancer. The tumor involved the [facility] splenic confluence and portal vein which remains patent. The tumor was felt to possibly a but the undersurface of the celiac axis. Additional imaging has included later a CT scan of the abdomen and pelvis. A poorly defined mass was seen in the head of the pancreas with extension into the surrounding fat. Partial encasement of the celiac axis was noted with the tumor and adjacent lymph nodes were present in the celiac axis and peripancreatic region.  The patient underwent an ERCP with stent placement. A 4-5 cm long bile duct stricture was noted with the proximal in the proximal common bile duct. Brushings were obtained which returned positive for adenocarcinoma. The patient had a CEA 19-9 level of 200 on 05/23/2013.   PREVIOUS RADIATION THERAPY: No   PAST MEDICAL HISTORY:  has a past medical history of Diabetes mellitus without  complication; Hypertension; Depression; Anxiety; Hyperlipidemia; Pancreatitis; GERD (gastroesophageal reflux disease); and Pancreatic cancer.     PAST SURGICAL HISTORY: Past Surgical History  Procedure Laterality Date  . Appendectomy    . Ercp N/A 05/24/2013    Procedure: ENDOSCOPIC RETROGRADE CHOLANGIOPANCREATOGRAPHY (ERCP);  Surgeon: Rachael Fee, MD;  Location: [facility] OR;  Service: Endoscopy;  Laterality: N/A;  MAC vs. general per anesthesia      FAMILY HISTORY: family history includes Diabetes type II in her mother; HIV/AIDS in her brother; Heart attack in her father; and Hypertension in her mother.   SOCIAL HISTORY:  reports that she quit smoking 4 days ago. She does not have any smokeless tobacco history on file. She reports that she does not drink alcohol or use illicit drugs.   ALLERGIES: Betadine   MEDICATIONS:  Current Outpatient Prescriptions  Medication Sig Dispense Refill  . amLODipine (NORVASC) 10 MG tablet Take 1 tablet (10 mg total) by mouth daily.  30 tablet  0  . gabapentin (NEURONTIN) 300 MG capsule Take 300 mg by mouth 3 (three) times daily.      Marland Kitchen ibuprofen (ADVIL) 200 MG tablet Take 600 mg by mouth every 6 (six) hours as needed for pain.      Marland Kitchen insulin aspart (NOVOLOG) 100 UNIT/ML injection Inject 10 Units into the skin 3 (three) times daily with meals.      . insulin glargine (LANTUS) 100 UNIT/ML injection Inject 20 Units into the skin at bedtime.      Marland Kitchen lisinopril (PRINIVIL,ZESTRIL) 20 MG tablet Take 1 tablet (  20 mg total) by mouth daily.  30 tablet  0  . ondansetron (ZOFRAN ODT) 4 MG disintegrating tablet Take 1 tablet (4 mg total) by mouth every 8 (eight) hours as needed for nausea.  20 tablet  0  . oxyCODONE (ROXICODONE) 5 MG immediate release tablet Take 1 tablet (5 mg total) by mouth every 4 (four) hours as needed for pain.  30 tablet  0  . oxyCODONE (ROXICODONE) 5 MG immediate release tablet Take 1 tablet (5 mg total) by mouth every 4 (four) hours as needed  for pain.  20 tablet  0  . potassium chloride (K-DUR,KLOR-CON) 10 MEQ tablet Take 10 mEq by mouth daily.      . simvastatin (ZOCOR) 10 MG tablet Take 10 mg by mouth at bedtime.       No current facility-administered medications for this encounter.     REVIEW OF SYSTEMS:  A 15 point review of systems is documented in the electronic medical record. This was obtained by the nursing staff. However, I reviewed this with the patient to discuss relevant findings and make appropriate changes.  Pertinent items are noted in HPI.    PHYSICAL EXAM:  height is 5\' 3"  (1.6 m) and weight is 155 lb 14.4 oz (70.716 kg). Her temperature is 98.5 F (36.9 C). Her blood pressure is 145/69 and her pulse is 74. Her oxygen saturation is 100%.   General: Well-developed, in no acute distress HEENT: Normocephalic, atraumatic; oral cavity clear Neck: Supple without any lymphadenopathy Cardiovascular: Regular rate and rhythm Respiratory: Clear to auscultation bilaterally GI: Soft, slight tenderness in the epigastric region, normal bowel sounds Extremities: No edema present Neuro: No focal deficits     LABORATORY DATA:  Lab Results  Component Value Date   WBC 9.0 05/28/2013   HGB 13.0 05/28/2013   HCT 37.9 05/28/2013   MCV 86.1 05/28/2013   PLT 252 05/28/2013   Lab Results  Component Value Date   NA 139 05/28/2013   K 4.4 05/28/2013   CL 100 05/28/2013   CO2 30 05/28/2013   Lab Results  Component Value Date   ALT 138* 05/28/2013   AST 49* 05/28/2013   ALKPHOS 319* 05/28/2013   BILITOT 1.2 05/28/2013      RADIOGRAPHY: US Abdomen Complete  05/22/2013   *RADIOLOGY REPORT*  Clinical Data:  Abdominal pain.  Elevated liver function tests. Elevated bilirubin.  COMPLETE ABDOMINAL ULTRASOUND  Comparison:  05/22/2013 radiographs  Findings:  Gallbladder:  Gallbladder wall thickening up to 5 mm.  Sonographic Murphy's sign absent.  Gallbladder sludge noted.  Common bile duct:  Abnormally prominent, measuring 1.2 cm in  diameter, without directly visualized choledocholithiasis.  Liver:  Intrahepatic bile duct dilatation noted without focal hepatic lesion identified.  IVC:  Appears normal.  Pancreas:  Heterogeneous pancreatic head and body.  Possible pancreatic head mass.  This measures 4.1 x 4.1 x 3.0 cm.  Spleen:  Unremarkable, 5.0 cm craniocaudad.  Right Kidney:  Measures 11.2 cm in length and appears normal.  Left Kidney:  Measures 10.8 cm in length and contains a 1.8 cm lower pole cyst as well as a 8 mm linear echogenic structure along the renal pelvis which could represent a linear renal calculus.  Abdominal aorta:  No aneurysm identified.  Aortic bifurcation obscured by overlying bowel gas.  IMPRESSION:  1.  Abnormal intrahepatic bile duct dilatation with dilated common bile duct and heterogeneous pancreatic head suspicious for possible pancreatic mass, although localized pancreatitis could possibly have a similar  sonographic appearance.  MRCP with and without contrast is recommended for further characterization.  If this is not feasible, consider a pancreatic protocol CT scan with and without contrast to assess for malignancy. 2.  Thickened gallbladder wall without pericholecystic fluid or sonographic Murphy's sign.  Correlate clinically in assessing for cholecystitis.  There is sludge in the gallbladder. 3.  Possible small linear left kidney lower pole calculus.   Original Report Authenticated By: Gaylyn Rong, M.D.   Ct Abdomen Pelvis W Contrast  05/29/2013   *RADIOLOGY REPORT*  Clinical Data: Upper abdominal pain radiating to the chest. Diagnosed 1 week ago with pancreatic cancer.  Weight gain with abdominal swelling.  CT ABDOMEN AND PELVIS WITH CONTRAST  Technique:  Multidetector CT imaging of the abdomen and pelvis was performed following the standard protocol during bolus administration of intravenous contrast.  Contrast: OMNIPAQUE IOHEXOL 300 MG/ML  SOLN  Comparison: MRI / MRCP 05/22/2013  Findings: Mild  dependent atelectasis in the lung bases.  There is a poorly defined hypodense 3 x 4.3 cm diameter mass in the head of the pancreas with infiltration in the surrounding fat planes between the pancreas and duodenum and with partial encasement of the celiac axis.  Celiac axis lymphadenopathy. Proximal pancreatic ductal dilatation.  Changes are consistent with pancreatic carcinoma with local extension and local lymph node metastasis.  A biliary stent is in place.  There is mild prominence of intrahepatic bile ducts.  There is residual contrast material filling the gallbladder and cystic duct.  This may result from previous ERCP on 05/24/2013 or could represent vicarious excretion. Cystic duct obstruction is not excluded.  The liver parenchyma appears homogeneous and no liver metastases are suggested.  Spleen size is normal.  Small accessory spleen.  No adrenal gland nodules. Cyst in the left kidney measures 16 mm diameter.  There is mild wall thickening.  No solid mass or hydronephrosis in the kidneys. Calcification of the abdominal aorta without aneurysm.  Inferior vena cava is normal in caliber.  The stomach and small bowel are not abnormally distended.  There is some wall thickening of the distal stomach and proximal duodenum which could be due to tumor infiltration or reactive inflammation.  Stool filled colon without distension.  No free air or free fluid in the abdomen.  Abdominal wall musculature appears intact.  Pelvis:  Calcification in the uterus consistent with fibroids.  No abnormal adnexal masses.  Bladder wall is not thickened.  Stool filled rectosigmoid colon without evidence of diverticulitis. Appendix is not identified.  The no free or loculated pelvic fluid collections.  No significant pelvic lymphadenopathy.  Degenerative changes in the spine.  No destructive or expansile bone lesions are appreciated.  IMPRESSION: Poorly defined mass in the head of the pancreas with extension into surrounding fat,  partial encasement of the celiac axis, and with adjacent celiac axis and peripancreatic lymph nodes enlarged. Pancreatic ductal dilatation.  Biliary stent decompresses the bile ducts.  Residual contrast material filling the gallbladder and cystic duct. This may represent vicarious excretion, but cystic duct obstruction is not excluded.   Original Report Authenticated By: Burman Nieves, M.D.   Mr 3d Recon At Scanner  05/23/2013   *RADIOLOGY REPORT*  Clinical Data:  Biliary dilatation, suspected pancreatic cancer on ultrasound  MRI ABDOMEN WITHOUT AND WITH CONTRAST (INCLUDING MRCP)  Technique:  Multiplanar multisequence MR imaging of the abdomen was performed both before and after the administration of intravenous contrast. Heavily T2-weighted images of the biliary and pancreatic ducts were  obtained, and three-dimensional MRCP images were rendered by post processing.  Contrast: 15 ml Multihance IV  Comparison:  Ultrasound abdomen dated 05/22/2013  Findings:  Liver is notable for moderate intrahepatic ductal dilatation.  No suspicious/enhancing hepatic lesions.  No hepatic steatosis.  Spleen and adrenal glands are within normal limits.  Gallbladder is mildly thick-walled but otherwise unremarkable. Dilated CBD, measuring 10 mm, with abrupt cutoff at the level of the pancreatic head.  3.1 x 5.3 cm hypoenhancing mass at the junction of the pancreatic head/body (series 81/image 50), highly suspicious for pancreatic cancer.  Atrophy of the distal pancreatic body/tail.  Associated dilatation of the main pancreatic duct in the pancreatic body/tail, with abrupt cutoff at the level of the mass.  Although the SMA and portal vein remain patent (series 1802/images 48 57), the mass narrows/encases the portosplenic confluence and the portal vein (series 1802/images 47 and 53). SMA is uninvolved. Tumor may abut the undersurface of the celiac axis (series 81/image 44).  1.7 x 1.7 cm anterior interpolar left renal cyst (series  4/image 20).  Right kidneys within normal limits.  No hydronephrosis.  No abdominal ascites.  No definite abdominal lymphadenopathy.  No focal osseous lesions.  IMPRESSION: 3.1 x 5.3 cm mass at the junction of the pancreatic head/body, highly suspicious for pancreatic cancer.  Consider EUS with tissue sampling for confirmation.  Tumor involves/encases the portosplenic confluence and portal vein, which remains patent.  Tumor may abut the undersurface of the celiac axis.  Secondary intrahepatic/extrahepatic ductal dilatation.  CBD measures 10 mm.   Original Report Authenticated By: Charline Bills, M.D.   Dg Ercp  05/24/2013   *RADIOLOGY REPORT*  Clinical Data: Biliary obstruction.  ERCP  Comparison: MRCP dated 05/22/2013  Findings: Multiple images from ERCP demonstrate dilatation of the biliary tree with a mass compressing the distal common bile duct consistent with the tumor seen in the head of the pancreas. Biliary stent has been inserted on the last image.  IMPRESSION: Mass obstructing the distal common bile duct.  Stent placed.   Original Report Authenticated By: Francene Boyers, M.D.   Dg Abd Acute W/chest  05/22/2013   *RADIOLOGY REPORT*  Clinical Data: Abdominal pain and back pain.  ACUTE ABDOMEN SERIES (ABDOMEN 2 VIEW & CHEST 1 VIEW)  Comparison: Lumbar radiographs dated 03/19/2013  Findings: There is borderline cardiomegaly.  Pulmonary vascularity is normal and the lungs are clear.  No free air or free fluid in the abdomen.  Normal bowel gas pattern.  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  No acute osseous abnormality.  IMPRESSION: No acute abnormalities.   Original Report Authenticated By: Francene Boyers, M.D.   Mr Abd W/wo Cm/mrcp  05/23/2013   *RADIOLOGY REPORT*  Clinical Data:  Biliary dilatation, suspected pancreatic cancer on ultrasound  MRI ABDOMEN WITHOUT AND WITH CONTRAST (INCLUDING MRCP)  Technique:  Multiplanar multisequence MR imaging of the abdomen was performed both before and after  the administration of intravenous contrast. Heavily T2-weighted images of the biliary and pancreatic ducts were obtained, and three-dimensional MRCP images were rendered by post processing.  Contrast: 15 ml Multihance IV  Comparison:  Ultrasound abdomen dated 05/22/2013  Findings:  Liver is notable for moderate intrahepatic ductal dilatation.  No suspicious/enhancing hepatic lesions.  No hepatic steatosis.  Spleen and adrenal glands are within normal limits.  Gallbladder is mildly thick-walled but otherwise unremarkable. Dilated CBD, measuring 10 mm, with abrupt cutoff at the level of the pancreatic head.  3.1 x 5.3 cm hypoenhancing mass at the junction  of the pancreatic head/body (series 81/image 50), highly suspicious for pancreatic cancer.  Atrophy of the distal pancreatic body/tail.  Associated dilatation of the main pancreatic duct in the pancreatic body/tail, with abrupt cutoff at the level of the mass.  Although the SMA and portal vein remain patent (series 1802/images 48 57), the mass narrows/encases the portosplenic confluence and the portal vein (series 1802/images 47 and 53). SMA is uninvolved. Tumor may abut the undersurface of the celiac axis (series 81/image 44).  1.7 x 1.7 cm anterior interpolar left renal cyst (series 4/image 20).  Right kidneys within normal limits.  No hydronephrosis.  No abdominal ascites.  No definite abdominal lymphadenopathy.  No focal osseous lesions.  IMPRESSION: 3.1 x 5.3 cm mass at the junction of the pancreatic head/body, highly suspicious for pancreatic cancer.  Consider EUS with tissue sampling for confirmation.  Tumor involves/encases the portosplenic confluence and portal vein, which remains patent.  Tumor may abut the undersurface of the celiac axis.  Secondary intrahepatic/extrahepatic ductal dilatation.  CBD measures 10 mm.   Original Report Authenticated By: Charline Bills, M.D.       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 This appears to represent a T3 tumor with positive lymph nodes seen on abdominal imaging. I would classify this at this time as a potentially resectable tumor which may benefit from neoadjuvant chemoradiotherapy. We will have the patient's case discussed at conference to get the surgeon's opinion on potential resectability.  I discussed with the patient therefore a potential 5-1/2 week course of chemoradiotherapy. I discussed the rationale of this treatment including potential shrinkage of tumor with improved resectability. I also discussed the potential side effects and risks of treatment as well. All of her questions were answered. The patient does wish to proceed with this treatment plan.   PLAN:  -Presentation at GI conference on 06/02/2013 -I have ordered a PET scan for further pretreatment staging. The patient is scheduled to see medical oncology early next week.    I spent 60 minutes minutes face to face with the patient and more than 50% of that time was spent in counseling and/or coordination of care.    ________________________________   Radene Gunning, MD, PhD

## 2013-06-03 ENCOUNTER — Other Ambulatory Visit: Payer: Self-pay | Admitting: Radiation Oncology

## 2013-06-04 ENCOUNTER — Ambulatory Visit: Payer: Medicaid Other

## 2013-06-04 ENCOUNTER — Ambulatory Visit: Payer: Medicaid Other | Admitting: Oncology

## 2013-06-06 ENCOUNTER — Emergency Department (HOSPITAL_COMMUNITY)
Admission: EM | Admit: 2013-06-06 | Discharge: 2013-06-06 | Disposition: A | Discharge status: Home / Self Care | Payer: Medicaid Other | Attending: Emergency Medicine | Admitting: Emergency Medicine

## 2013-06-06 ENCOUNTER — Encounter (HOSPITAL_COMMUNITY): Payer: Self-pay | Admitting: *Deleted

## 2013-06-06 DIAGNOSIS — F411 Generalized anxiety disorder: Secondary | ICD-10-CM | POA: Insufficient documentation

## 2013-06-06 DIAGNOSIS — Z8719 Personal history of other diseases of the digestive system: Secondary | ICD-10-CM | POA: Insufficient documentation

## 2013-06-06 DIAGNOSIS — Z8509 Personal history of malignant neoplasm of other digestive organs: Secondary | ICD-10-CM | POA: Insufficient documentation

## 2013-06-06 DIAGNOSIS — Z8659 Personal history of other mental and behavioral disorders: Secondary | ICD-10-CM | POA: Insufficient documentation

## 2013-06-06 DIAGNOSIS — I10 Essential (primary) hypertension: Secondary | ICD-10-CM | POA: Insufficient documentation

## 2013-06-06 DIAGNOSIS — E119 Type 2 diabetes mellitus without complications: Secondary | ICD-10-CM | POA: Insufficient documentation

## 2013-06-06 DIAGNOSIS — R110 Nausea: Secondary | ICD-10-CM | POA: Insufficient documentation

## 2013-06-06 DIAGNOSIS — R109 Unspecified abdominal pain: Secondary | ICD-10-CM | POA: Insufficient documentation

## 2013-06-06 DIAGNOSIS — E785 Hyperlipidemia, unspecified: Secondary | ICD-10-CM | POA: Insufficient documentation

## 2013-06-06 DIAGNOSIS — Z79899 Other long term (current) drug therapy: Secondary | ICD-10-CM | POA: Insufficient documentation

## 2013-06-06 DIAGNOSIS — G8929 Other chronic pain: Secondary | ICD-10-CM

## 2013-06-06 DIAGNOSIS — Z794 Long term (current) use of insulin: Secondary | ICD-10-CM | POA: Insufficient documentation

## 2013-06-06 DIAGNOSIS — Z9889 Other specified postprocedural states: Secondary | ICD-10-CM | POA: Insufficient documentation

## 2013-06-06 DIAGNOSIS — Z87891 Personal history of nicotine dependence: Secondary | ICD-10-CM | POA: Insufficient documentation

## 2013-06-06 LAB — CBC WITH DIFFERENTIAL/PLATELET
Basophils Relative: 0 % (ref 0–1)
Eosinophils Absolute: 0.3 10*3/uL (ref 0.0–0.7)
Eosinophils Relative: 3 % (ref 0–5)
Hemoglobin: 12.8 g/dL (ref 12.0–15.0)
Lymphs Abs: 2.1 10*3/uL (ref 0.7–4.0)
MCH: 28.3 pg (ref 26.0–34.0)
MCHC: 32.5 g/dL (ref 30.0–36.0)
MCV: 87.2 fL (ref 78.0–100.0)
Monocytes Relative: 6 % (ref 3–12)
Platelets: 244 10*3/uL (ref 150–400)
RBC: 4.52 MIL/uL (ref 3.87–5.11)

## 2013-06-06 LAB — COMPREHENSIVE METABOLIC PANEL
BUN: 26 mg/dL — ABNORMAL HIGH (ref 6–23)
Calcium: 10 mg/dL (ref 8.4–10.5)
GFR calc Af Amer: >90 mL/min (ref >90)
Glucose, Bld: 137 mg/dL — ABNORMAL HIGH (ref 70–99)
Total Protein: 8.3 g/dL (ref 6.0–8.3)

## 2013-06-06 LAB — URINALYSIS, ROUTINE W REFLEX MICROSCOPIC
Bilirubin Urine: NEGATIVE
Leukocytes, UA: NEGATIVE
Nitrite: NEGATIVE
Specific Gravity, Urine: 1.024 (ref 1.005–1.030)
Urobilinogen, UA: 1 mg/dL (ref 0.0–1.0)

## 2013-06-06 LAB — LIPASE, BLOOD: Lipase: 30 U/L (ref 11–59)

## 2013-06-06 MED ORDER — HYDROMORPHONE HCL PF 2 MG/ML IJ SOLN
2.0000 mg | Freq: Once | INTRAMUSCULAR | Status: AC
  Administered 2013-06-06: 2 mg via INTRAMUSCULAR
  Filled 2013-06-06: qty 1

## 2013-06-06 MED ORDER — HYDROMORPHONE HCL 4 MG PO TABS: 2.0000 mg | ORAL_TABLET | ORAL | Status: DC | PRN

## 2013-06-06 NOTE — ED Notes (Signed)
The pt is c/o generalized abd pain and she reports that she has abd pain all the time worse tonight.  She thinks she has pancreatitis.  Nausea no vomiting

## 2013-06-06 NOTE — ED Notes (Signed)
Unable to void.  Difficult stick waiting for lab to draw

## 2013-06-06 NOTE — ED Provider Notes (Signed)
History    CSN: [id number] Arrival date & time 06/06/13  0152  First MD Initiated Contact with Patient 06/06/13 [phone]     Chief Complaint  Patient presents with  . Abdominal Pain   (Consider location/radiation/quality/duration/timing/severity/associated sxs/prior Treatment) HPI This is a 57 year old female with a history of pancreatitis and recent diagnosis of pancreatic cancer. She has had episodic abdominal pain. Her most recent episode began yesterday evening and is characterized as like prior episodes including episodes of pancreatitis. The pain is moderate to severe. It has not been relieved by her regular pain medication. It is generalized but primarily in the epigastrium and upper abdomen. It radiates to the back as well. She has had some nausea but not severe enough to take an antiemetic. She has had no vomiting, diarrhea or constipation. Her abdomen is not distended. She would like something for pain and wishes to return home. She will followup with her oncologist regarding her recent PET scan.  Past Medical History  Diagnosis Date  . Diabetes mellitus without complication   . Hypertension   . Depression   . Anxiety   . Hyperlipidemia   . Pancreatitis   . GERD (gastroesophageal reflux disease)   . Pancreatic cancer    Past Surgical History  Procedure Laterality Date  . Appendectomy    . Ercp N/A 05/24/2013    Procedure: ENDOSCOPIC RETROGRADE CHOLANGIOPANCREATOGRAPHY (ERCP);  Surgeon: Rachael Fee, MD;  Location: [facility] OR;  Service: Endoscopy;  Laterality: N/A;  MAC vs. general per anesthesia    Family History  Problem Relation Age of Onset  . Diabetes type II Mother   . Hypertension Mother   . Heart attack Father   . HIV/AIDS Brother    History  Substance Use Topics  . Smoking status: Former Smoker -- 0.50 packs/day for 42 years    Quit date: 05/28/2013  . Smokeless tobacco: Not on file  . Alcohol Use: No   OB History   Grav Para Term Preterm Abortions TAB SAB  Ect Mult Living                 Review of Systems  All other systems reviewed and are negative.    Allergies  Betadine  Home Medications   Current Outpatient Rx  Name  Route  Sig  Dispense  Refill  . amLODipine (NORVASC) 10 MG tablet   Oral   Take 1 tablet (10 mg total) by mouth daily.   30 tablet   0   . gabapentin (NEURONTIN) 300 MG capsule   Oral   Take 300 mg by mouth 3 (three) times daily.         Marland Kitchen HYDROcodone-acetaminophen (NORCO/VICODIN) 5-325 MG per tablet   Oral   Take 1 tablet by mouth every 6 (six) hours as needed for pain.         Marland Kitchen ibuprofen (ADVIL) 200 MG tablet   Oral   Take 600 mg by mouth every 6 (six) hours as needed for pain.         Marland Kitchen insulin aspart (NOVOLOG) 100 UNIT/ML injection   Subcutaneous   Inject 10 Units into the skin 3 (three) times daily with meals.         . insulin glargine (LANTUS) 100 UNIT/ML injection   Subcutaneous   Inject 20 Units into the skin at bedtime.         Marland Kitchen lisinopril (PRINIVIL,ZESTRIL) 20 MG tablet   Oral   Take 1 tablet (20 mg total)  by mouth daily.   30 tablet   0   . ondansetron (ZOFRAN ODT) 4 MG disintegrating tablet   Oral   Take 1 tablet (4 mg total) by mouth every 8 (eight) hours as needed for nausea.   20 tablet   0   . oxyCODONE (ROXICODONE) 5 MG immediate release tablet   Oral   Take 1 tablet (5 mg total) by mouth every 4 (four) hours as needed for pain.   20 tablet   0   . potassium chloride (K-DUR,KLOR-CON) 10 MEQ tablet   Oral   Take 10 mEq by mouth daily.         . simvastatin (ZOCOR) 10 MG tablet   Oral   Take 10 mg by mouth at bedtime.          BP 159/80  Pulse 74  Temp(Src) 98.2 F (36.8 C) (Oral)  Resp 16  SpO2 98%  Physical Exam General: Well-developed, well-nourished female in no acute distress; appearance consistent with age of record HENT: normocephalic, atraumatic Eyes: pupils equal round and reactive to light; extraocular muscles intact Neck:  supple Heart: regular rate and rhythm; Lungs: clear to auscultation bilaterally Abdomen: soft; nondistended; diffuse tenderness primarily in the epigastrium; small mass palpated in the left upper abdomen; bowel sounds present Extremities: No deformity; full range of motion; pulses normal; no edema Neurologic: Awake, alert and oriented; motor function intact in all extremities and symmetric; no facial droop; hard of hearing Skin: Warm and dry Psychiatric: Normal mood and affect    ED Course  Procedures (including critical care time)   MDM   Nursing notes and vitals signs, including pulse oximetry, reviewed.  Summary of this visit's results, reviewed by myself:  Labs:  Results for orders placed during the hospital encounter of 06/06/13 (from the past 24 hour(s))  CBC WITH DIFFERENTIAL     Status: None   Collection Time    06/06/13  1:59 AM      Result Value Range   WBC 8.0  4.0 - 10.5 K/uL   RBC 4.52  3.87 - 5.11 MIL/uL   Hemoglobin 12.8  12.0 - 15.0 g/dL   HCT 40.9  81.1 - 91.4 %   MCV 87.2  78.0 - 100.0 fL   MCH 28.3  26.0 - 34.0 pg   MCHC 32.5  30.0 - 36.0 g/dL   RDW 78.2  95.6 - 21.3 %   Platelets 244  150 - 400 K/uL   Neutrophils Relative % 64  43 - 77 %   Neutro Abs 5.1  1.7 - 7.7 K/uL   Lymphocytes Relative 26  12 - 46 %   Lymphs Abs 2.1  0.7 - 4.0 K/uL   Monocytes Relative 6  3 - 12 %   Monocytes Absolute 0.5  0.1 - 1.0 K/uL   Eosinophils Relative 3  0 - 5 %   Eosinophils Absolute 0.3  0.0 - 0.7 K/uL   Basophils Relative 0  0 - 1 %   Basophils Absolute 0.0  0.0 - 0.1 K/uL  COMPREHENSIVE METABOLIC PANEL     Status: Abnormal   Collection Time    06/06/13  1:59 AM      Result Value Range   Sodium 138  135 - 145 mEq/L   Potassium 3.9  3.5 - 5.1 mEq/L   Chloride 104  96 - 112 mEq/L   CO2 25  19 - 32 mEq/L   Glucose, Bld 137 (*) 70 - 99 mg/dL  BUN 26 (*) 6 - 23 mg/dL   Creatinine, Ser 1.61  0.50 - 1.10 mg/dL   Calcium 09.6  8.4 - 04.5 mg/dL   Total Protein  8.3  6.0 - 8.3 g/dL   Albumin 3.9  3.5 - 5.2 g/dL   AST 35  0 - 37 U/L   ALT 38 (*) 0 - 35 U/L   Alkaline Phosphatase 190 (*) 39 - 117 U/L   Total Bilirubin 0.7  0.3 - 1.2 mg/dL   GFR calc non Af Amer 78 (*) >90 mL/min   GFR calc Af Amer >90  >90 mL/min  LIPASE, BLOOD     Status: None   Collection Time    06/06/13  1:59 AM      Result Value Range   Lipase 30  11 - 59 U/L  URINALYSIS, ROUTINE W REFLEX MICROSCOPIC     Status: Abnormal   Collection Time    06/06/13  4:20 AM      Result Value Range   Color, Urine YELLOW  YELLOW   APPearance CLOUDY (*) CLEAR   Specific Gravity, Urine 1.024  1.005 - 1.030   pH 5.5  5.0 - 8.0   Glucose, UA NEGATIVE  NEGATIVE mg/dL   Hgb urine dipstick NEGATIVE  NEGATIVE   Bilirubin Urine NEGATIVE  NEGATIVE   Ketones, ur NEGATIVE  NEGATIVE mg/dL   Protein, ur NEGATIVE  NEGATIVE mg/dL   Urobilinogen, UA 1.0  0.0 - 1.0 mg/dL   Nitrite NEGATIVE  NEGATIVE   Leukocytes, UA NEGATIVE  NEGATIVE     Hanley Seamen, MD 06/06/13 [phone]

## 2013-06-07 ENCOUNTER — Encounter (HOSPITAL_COMMUNITY): Payer: Medicaid Other

## 2013-06-08 ENCOUNTER — Encounter: Payer: Self-pay | Admitting: Oncology

## 2013-06-08 ENCOUNTER — Ambulatory Visit (HOSPITAL_BASED_OUTPATIENT_CLINIC_OR_DEPARTMENT_OTHER): Payer: Medicaid Other | Admitting: Oncology

## 2013-06-08 ENCOUNTER — Telehealth: Payer: Self-pay | Admitting: Oncology

## 2013-06-08 ENCOUNTER — Ambulatory Visit: Payer: Medicaid Other

## 2013-06-08 VITALS — BP 147/75 | HR 66 | Temp 98.4°F | Resp 18 | Ht 63.0 in | Wt 153.2 lb

## 2013-06-08 DIAGNOSIS — C259 Malignant neoplasm of pancreas, unspecified: Secondary | ICD-10-CM

## 2013-06-08 NOTE — Progress Notes (Signed)
[facility] New Patient Consult   Referring MD: Breniyah Romm 57 y.o.  06-Apr-1956    Reason for Referral: Pancreas cancer     HPI: She presented to the emergency room with abdominal pain and dark urine on 05/22/2013. She also reported nausea and vomiting. She was noted to be jaundiced. An abdominal ultrasound on 05/22/2013 revealed abnormal bile duct and suspicion for a pancreas mass. She was referred for an MRCP on 05/22/2013. No suspicious hepatic lesions. The common bile duct was dilated with an abrupt cutoff at the level of pancreatic head. A 3.1 x 5.3 cm mass was noted at the junction of the pancreatic head and body with atrophy of the distal pancreatic body/tail. The mass was noted to narrowing/encase the port of splenic complements and portal vein. The superior mesenteric artery was uninvolved. Tumor was noted to potentially abut the undersurface of the celiac axis.  She was taken to the ERCP procedure on 05/24/2013. A bile duct stricture was noted and a stent was placed. Brushings were obtained. The cytology ([id number]) confirmed malignant cells consistent with adenocarcinoma.  She was referred to Dr. Mitzi Hansen to consider radiation therapy. Her case was presented at the GI tumor conference and the tumor is felt to be unresectable at present. She is scheduled for a PET scan on 06/09/2013 for additional staging.  A CT of the abdomen on 05/29/2013 revealed a poorly defined mass in the head of the pancreas with infiltration in the surrounding fat plane between the pancreas and duodenum. Celiac axis lymphadenopathy was noted. Partial encasement of the celiac axis.  She has abdomen and back pain. The pain is relieved with hydromorphone.   Past Medical History  Diagnosis Date  . Diabetes mellitus   . Hypertension   . Depression   . Anxiety   . Hyperlipidemia   . Pancreatitis   . GERD (gastroesophageal reflux disease)   . Pancreatic cancer  July 2014    .     G7 P5, 2 miscarriages  .    Diabetic neuropathy  Past Surgical History  Procedure Laterality Date  . Appendectomy    . Ercp N/A 05/24/2013    Procedure: ENDOSCOPIC RETROGRADE CHOLANGIOPANCREATOGRAPHY (ERCP);  Surgeon: Rachael Fee, MD;  Location: [facility] OR;  Service: Endoscopy;  Laterality: N/A;  MAC vs. general per anesthesia     Family History  Problem Relation Age of Onset  . Diabetes type II Mother   . Hypertension Mother   . Heart attack Father   . HIV/AIDS Brother    maternal cousin died of breast cancer at a young age, a maternal uncle had colon cancer 3 brothers and 2 sisters. No other family history of cancer  Current outpatient prescriptions:amLODipine (NORVASC) 10 MG tablet, Take 1 tablet (10 mg total) by mouth daily., Disp: 30 tablet, Rfl: 0;  gabapentin (NEURONTIN) 300 MG capsule, Take 300 mg by mouth 3 (three) times daily., Disp: , Rfl: ;  HYDROmorphone (DILAUDID) 4 MG tablet, Take 0.5-1 tablets (2-4 mg total) by mouth every 4 (four) hours as needed for pain., Disp: 30 tablet, Rfl: 0 ibuprofen (ADVIL) 200 MG tablet, Take 600 mg by mouth every 6 (six) hours as needed for pain., Disp: , Rfl: ;  insulin aspart (NOVOLOG) 100 UNIT/ML injection, Inject 10 Units into the skin 3 (three) times daily with meals., Disp: , Rfl: ;  insulin glargine (LANTUS) 100 UNIT/ML injection, Inject 20 Units into the skin at bedtime., Disp: , Rfl:  lisinopril (  PRINIVIL,ZESTRIL) 20 MG tablet, Take 1 tablet (20 mg total) by mouth daily., Disp: 30 tablet, Rfl: 0;  ondansetron (ZOFRAN ODT) 4 MG disintegrating tablet, Take 1 tablet (4 mg total) by mouth every 8 (eight) hours as needed for nausea., Disp: 20 tablet, Rfl: 0;  potassium chloride (K-DUR,KLOR-CON) 10 MEQ tablet, Take 10 mEq by mouth daily., Disp: , Rfl: ;  simvastatin (ZOCOR) 10 MG tablet, Take 10 mg by mouth at bedtime., Disp: , Rfl:   Allergies:  Allergies  Allergen Reactions  . Betadine (Povidone Iodine) Itching and Rash    Social History:  She lives with her son in [place]. 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 She works in the [employer]. She quit smoking cigarettes a few weeks ago. She has a history of heavy alcohol use, not drinking at present. No transfusion history. No risk factor for HIV or hepatitis.    ROS:   Positives include: 20-25 pound weight loss, pain with bowel movements and urination, intermittent bilateral leg and hip pain, back pain, abdominal pain  A complete ROS was otherwise negative.  Physical Exam:  Blood pressure 147/75, pulse 66, temperature 98.4 F (36.9 C), temperature source Oral, resp. rate 18, height 5\' 3"  (1.6 m), weight 153 lb 3.2 oz (69.491 kg).  HEENT: Oropharynx without visible mass, neck without mass Lungs: Clear bilaterally Cardiac: Regular rate and rhythm Abdomen: No hepatomegaly, no apparent ascites, no mass, tender in the right abdomen  Vascular: No leg edema Lymph nodes: No cervical, supra-clavicular, axillary, or inguinal node Neurologic: Alert and oriented, the motor exam appears intact in the upper and lower extremities Skin: No rash, 1 cm firm cutaneous nodular lesion at the left flank Musculoskeletal: No spine tenderness   LAB:  CBC  Lab Results  Component Value Date   WBC 8.0 06/06/2013   HGB 12.8 06/06/2013   HCT 39.4 06/06/2013   MCV 87.2 06/06/2013   PLT 244 06/06/2013     CMP      Component Value Date/Time   NA 138 06/06/2013 0159   K 3.9 06/06/2013 0159   CL 104 06/06/2013 0159   CO2 25 06/06/2013 0159   GLUCOSE 137* 06/06/2013 0159   BUN 26* 06/06/2013 0159   CREATININE 0.81 06/06/2013 0159   CALCIUM 10.0 06/06/2013 0159   PROT 8.3 06/06/2013 0159   ALBUMIN 3.9 06/06/2013 0159   AST 35 06/06/2013 0159   ALT 38* 06/06/2013 0159   ALKPHOS 190* 06/06/2013 0159   BILITOT 0.7 06/06/2013 0159   GFRNONAA 78* 06/06/2013 0159   GFRAA >90 06/06/2013 0159   CA 19-9 on 05/23/2013-200.7  Radiology: As per history of present illness    Assessment/Plan:   1.  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 Obstructive jaundice secondary to #1-status post placement of a bile duct stent on 05/24/2013  3. Diabetes  4. History of chronic pancreatitis  5. History of alcohol use  6. History of tobacco use  7. Anxiety/depression  8. Pain secondary to pancreas cancer-controlled with hydromorphone  9. Cutaneous nodule at the left flank-? Cyst,? Metastasis   Disposition:   Ms. Fiddler has been diagnosed with locally advanced pancreas cancer. I discussed the diagnosis and treatment options with Ms. Filippone and her daughter. Her case was presented at the GI tumor conference last week. The tumor does not appear resectable at present. She is symptomatic  with pain.  She saw Dr. Mitzi Hansen and is scheduled for a staging PET scan on 06/09/2013. If there is no evidence of distant metastatic disease I will recommend concurrent Xeloda and radiation. The chief purpose of the combined modality therapy will be to palliate her symptoms as it appears unlikely the tumor will become resectable.  I reviewed the potential toxicities associated with Xeloda including a chance for mucositis, diarrhea, and hematologic toxicity. We discussed the rash, hyperpigmentation, and hand/foot syndrome associated with Xeloda. She will be scheduled for a chemotherapy teaching class.  Ms. Novitsky will return for an office visit on 06/22/2013 to finalize a treatment plan. She is scheduled to see Dr. Mitzi Hansen next week.  Candelaria Pies 06/08/2013, 5:28 PM

## 2013-06-08 NOTE — Telephone Encounter (Signed)
PT SCHEDULE TO SEE DR. SHERRILL 07/22 @ 1:30

## 2013-06-08 NOTE — Progress Notes (Signed)
Checked in new pt with no financial concerns. °

## 2013-06-08 NOTE — Telephone Encounter (Signed)
gv and printed appt sched and avs for pt  °

## 2013-06-09 ENCOUNTER — Ambulatory Visit (HOSPITAL_COMMUNITY)
Admission: RE | Admit: 2013-06-09 | Discharge: 2013-06-09 | Disposition: A | Discharge status: Home / Self Care | Payer: Medicaid Other | Source: Ambulatory Visit | Attending: Radiation Oncology | Admitting: Radiation Oncology

## 2013-06-09 DIAGNOSIS — I2510 Atherosclerotic heart disease of native coronary artery without angina pectoris: Secondary | ICD-10-CM | POA: Insufficient documentation

## 2013-06-09 DIAGNOSIS — K6389 Other specified diseases of intestine: Secondary | ICD-10-CM | POA: Insufficient documentation

## 2013-06-09 DIAGNOSIS — E278 Other specified disorders of adrenal gland: Secondary | ICD-10-CM | POA: Insufficient documentation

## 2013-06-09 DIAGNOSIS — K829 Disease of gallbladder, unspecified: Secondary | ICD-10-CM | POA: Insufficient documentation

## 2013-06-09 DIAGNOSIS — I7090 Unspecified atherosclerosis: Secondary | ICD-10-CM | POA: Insufficient documentation

## 2013-06-09 DIAGNOSIS — I700 Atherosclerosis of aorta: Secondary | ICD-10-CM | POA: Insufficient documentation

## 2013-06-09 DIAGNOSIS — C259 Malignant neoplasm of pancreas, unspecified: Secondary | ICD-10-CM | POA: Insufficient documentation

## 2013-06-09 DIAGNOSIS — C772 Secondary and unspecified malignant neoplasm of intra-abdominal lymph nodes: Secondary | ICD-10-CM | POA: Insufficient documentation

## 2013-06-09 DIAGNOSIS — D259 Leiomyoma of uterus, unspecified: Secondary | ICD-10-CM | POA: Insufficient documentation

## 2013-06-09 DIAGNOSIS — K5730 Diverticulosis of large intestine without perforation or abscess without bleeding: Secondary | ICD-10-CM | POA: Insufficient documentation

## 2013-06-09 LAB — GLUCOSE, CAPILLARY: Glucose-Capillary: 82 mg/dL (ref 70–99)

## 2013-06-09 MED ORDER — FLUDEOXYGLUCOSE F - 18 (FDG) INJECTION
14.5000 | Freq: Once | INTRAVENOUS | Status: AC | PRN
  Administered 2013-06-09: 14.5 via INTRAVENOUS

## 2013-06-10 ENCOUNTER — Encounter: Payer: Self-pay | Admitting: Radiation Oncology

## 2013-06-10 ENCOUNTER — Other Ambulatory Visit: Payer: Self-pay | Admitting: Radiation Oncology

## 2013-06-10 NOTE — Progress Notes (Unsigned)
I spoke to patient today. She states that she has been clinically stable. Some continued upper abdominal pain bilaterally which has been a chronic issue for her. No worsening of this and no significant new complaints.  The patient's PET scan showed some findings suggestive of biliary obstruction/cholecystitis. She does not have any pain in the right upper quadrant and no additional GI complaints currently. No fever. The patient is being scheduled for a simulation and I will see how she is doing in clinic on that day as well.    The PET scan also showed some activity in the colon with a possible underlying mass. Colonoscopy was recommended and I discussed this with Dr. Larae Grooms office.

## 2013-06-14 ENCOUNTER — Other Ambulatory Visit: Payer: Self-pay

## 2013-06-14 ENCOUNTER — Telehealth: Payer: Self-pay

## 2013-06-14 DIAGNOSIS — C259 Malignant neoplasm of pancreas, unspecified: Secondary | ICD-10-CM

## 2013-06-14 DIAGNOSIS — R933 Abnormal findings on diagnostic imaging of other parts of digestive tract: Secondary | ICD-10-CM

## 2013-06-14 MED ORDER — MOVIPREP 100 G PO SOLR: 1.0000 | Freq: Once | ORAL | Status: DC

## 2013-06-14 NOTE — Telephone Encounter (Signed)
Message copied by Donata Duff on Mon Jun 14, 2013  8:50 AM ------      Message from: Rachael Fee      Created: Sun Jun 13, 2013  7:02 AM       Jonny Ruiz,      Just returned from out of town yesterday.            I'm not sure what to make of contrast still in GB given that her LFTs have improved since ERCP/stenting 2 weeks ago.  However since she needs the plastic stent exchanged for a metal (permanent) stent in next several weeks I will go ahead and get that done now. Can do at same time as colonoscopy.                  Yohana Bartha,      She needs colonoscopy and also ERCP, stent exchange.  +MAC, this Thursday or next Thursday.  Dx: abnormal imaging of colon, also pancreatic cancer.            DJ            Thanks             ----- Message -----         From: Jonna Coup, MD         Sent: 06/10/2013   6:29 PM           To: Rachael Fee, MD            Jesusita Oka - this patient with pancreatic cancer had a PET scan with a couple of additional GI findings. There was a suspicious area within the colon and colonoscopy was recommended. I called your office and talked to the staff, but your nurse was not there - I think they were working on getting that scheduled however.            There also was possible biliary obstruction/ cholecystitis. I wanted to see what you thought. The patient has no new symptoms, no RUQ pain - she is being scheduled for sim so I'll see her again in clinic soon.            If you would, let me know what you think.            John             ------

## 2013-06-14 NOTE — Telephone Encounter (Signed)
Left message on machine to call back  

## 2013-06-15 ENCOUNTER — Telehealth: Payer: Self-pay | Admitting: *Deleted

## 2013-06-15 NOTE — Telephone Encounter (Signed)
Left message on machine to call back 06/24/13 appt

## 2013-06-15 NOTE — Telephone Encounter (Signed)
Left message on machine to call back  

## 2013-06-15 NOTE — Telephone Encounter (Signed)
Attempted to reach patient without success.  Left voice message with contact name/number asking to please return call.

## 2013-06-16 NOTE — Progress Notes (Signed)
GI Location of Tumor / Histology: pancreatic head mass - adenocarcinoma   Patient presented with symptoms of: worsening of abdominal pain and vomiting. Also had dark urine.   Brushings of common bile duct (if applicable) revealed: COMMON BILE DUCT BRUSHING:  MALIGNANT CELLS PRESENT CONSISTENT WITH ADENOCARCINOMA.   Past/Anticipated interventions by surgeon, if any: ERCP with CBD stent placement 05/24/2013   Past/Anticipated interventions by medical oncology, if any: concurrent Xeloda and radiation per Dr. Truett Perna on 06/08/2013.  Weight changes, if any: 20 lbs loss in 3 months   Bowel/Bladder complaints, if any:   Nausea / Vomiting, if any:   Pain issues, if any:   SAFETY ISSUES:  Prior radiation? no  Pacemaker/ICD? no  Possible current pregnancy? no  Is the patient on methotrexate? No  Current Complaints / other details: Pet scan done 06/09/2013

## 2013-06-16 NOTE — Telephone Encounter (Signed)
Left message on machine to call back  

## 2013-06-17 ENCOUNTER — Ambulatory Visit: Payer: Medicaid Other

## 2013-06-17 ENCOUNTER — Encounter (HOSPITAL_COMMUNITY): Payer: Self-pay | Admitting: *Deleted

## 2013-06-17 ENCOUNTER — Telehealth: Payer: Self-pay

## 2013-06-17 ENCOUNTER — Ambulatory Visit
Admission: RE | Admit: 2013-06-17 | Discharge: 2013-06-17 | Disposition: A | Discharge status: Home / Self Care | Payer: Medicaid Other | Source: Ambulatory Visit | Attending: Radiation Oncology | Admitting: Radiation Oncology

## 2013-06-17 NOTE — Progress Notes (Signed)
In reviewing patient's EKG from 05/28/2013 in [facility] ,it stated abnormal ,? Brugada. Consulted Dr. Acey Lav with Anesthesia who looked at EKG and information about Brugada and needed me to follow up with Doctor she saw in ED on 05/28/2013 . Spoke with Dr. Manus Gunning in [facility] about patient's EKG and he stated "it was abnormal for Brugada - pathology per Dr. Terressa Koyanagi in Cardiology who he spoke with that night". Spoke with Dr. Terressa Koyanagi about EKG findings from 05/28/2013 who stated "she needed Cardiology workup if she was going to have Anesthesia for a procedure". Spoke with Patty, Dr. Christella Hartigan assistant and updated her on this information.Dr. Acey Lav informed of all information.

## 2013-06-17 NOTE — Telephone Encounter (Signed)
See alternate phone note  

## 2013-06-17 NOTE — Telephone Encounter (Signed)
Dr Christella Hartigan we got a call from [facility] endo today about this pt.  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  Dr Terressa Koyanagi at [facility] was consulted and he advises the pt have and EP consult due to the risk of sudden cardiac arrest.  Anesthesia will not sedate the pt.  Dr Mitzi Hansen referred.  I have not cancelled the colon/ ERCP appt.  Please advise

## 2013-06-17 NOTE — Telephone Encounter (Signed)
Pt has PAT appt on Monday for an EKG.  Should she keep this or cancel.  Call Jasmine December at [phone]

## 2013-06-18 NOTE — Telephone Encounter (Signed)
If anesthesia will not sedate her then we cannot do the colon/ERCP.  Please cancel that.  If she doesn't already have cardiology EP referral, she needs one set up for ?brugada syndrome.  Please schedule her for rov with me in 2-3 weeks so that we can get update on her situation.  Thanks

## 2013-06-18 NOTE — Telephone Encounter (Signed)
Procedures have been cancelled for [facility] and PAT I have left several messages with the pt to return my call so that we can set her up an appt with EP.  She also needs follow up with Dr Christella Hartigan as well.  Her voice mail is not accepting any further messages the mail box is full.  I will forward to Dr Christella Hartigan and Dr Mitzi Hansen

## 2013-06-21 ENCOUNTER — Other Ambulatory Visit (HOSPITAL_COMMUNITY): Payer: Medicaid Other

## 2013-06-21 ENCOUNTER — Telehealth: Payer: Self-pay | Admitting: Gastroenterology

## 2013-06-21 NOTE — Telephone Encounter (Signed)
Left message on machine to call back  

## 2013-06-22 ENCOUNTER — Ambulatory Visit: Payer: Medicaid Other | Admitting: Nurse Practitioner

## 2013-06-22 ENCOUNTER — Telehealth: Payer: Self-pay | Admitting: *Deleted

## 2013-06-22 ENCOUNTER — Telehealth: Payer: Self-pay | Admitting: Oncology

## 2013-06-22 ENCOUNTER — Other Ambulatory Visit: Payer: Medicaid Other

## 2013-06-22 NOTE — Telephone Encounter (Signed)
pt came in to r/s todays appts to 8.6 and 8.7.Marland KitchenMarland Kitchendone

## 2013-06-22 NOTE — Telephone Encounter (Signed)
Unable to reach the pt numerous messages have been left for the pt.  Message left that the appt has been cx.  Will await further calls from the pt

## 2013-06-22 NOTE — Telephone Encounter (Signed)
No additional note

## 2013-06-23 ENCOUNTER — Ambulatory Visit (HOSPITAL_BASED_OUTPATIENT_CLINIC_OR_DEPARTMENT_OTHER): Payer: Medicaid Other | Admitting: Nurse Practitioner

## 2013-06-23 ENCOUNTER — Ambulatory Visit
Admission: RE | Admit: 2013-06-23 | Discharge: 2013-06-23 | Disposition: A | Discharge status: Home / Self Care | Payer: Medicaid Other | Source: Ambulatory Visit | Attending: Radiation Oncology | Admitting: Radiation Oncology

## 2013-06-23 ENCOUNTER — Other Ambulatory Visit: Payer: Self-pay | Admitting: *Deleted

## 2013-06-23 ENCOUNTER — Telehealth: Payer: Self-pay | Admitting: Oncology

## 2013-06-23 VITALS — BP 175/83 | HR 79 | Temp 97.9°F | Resp 20 | Ht 63.0 in | Wt 152.6 lb

## 2013-06-23 VITALS — BP 146/76 | HR 79 | Temp 98.7°F | Ht 63.0 in | Wt 151.4 lb

## 2013-06-23 DIAGNOSIS — C259 Malignant neoplasm of pancreas, unspecified: Secondary | ICD-10-CM

## 2013-06-23 DIAGNOSIS — R109 Unspecified abdominal pain: Secondary | ICD-10-CM

## 2013-06-23 DIAGNOSIS — C250 Malignant neoplasm of head of pancreas: Secondary | ICD-10-CM

## 2013-06-23 DIAGNOSIS — E119 Type 2 diabetes mellitus without complications: Secondary | ICD-10-CM

## 2013-06-23 MED ORDER — POLYETHYLENE GLYCOL 3350 17 GM/SCOOP PO POWD: 17.0000 g | Freq: Every day | ORAL | Status: DC

## 2013-06-23 MED ORDER — HYDROMORPHONE HCL 4 MG PO TABS: 2.0000 mg | ORAL_TABLET | ORAL | Status: DC | PRN

## 2013-06-23 NOTE — Progress Notes (Signed)
Radiation Oncology         [phone]) [phone] ________________________________  Name: [name] [name] MRN: [id number]  Date: 06/23/2013  DOB: October 19, 1956  Follow-Up Visit Note  CC: Dorrene German, MD  Rachael Fee, MD  Diagnosis:   Pancreatic cancer  Interval Since Last Radiation:  Not applicable   Narrative:  The patient returns today for routine follow-up.  He patient's case was discussed in multidisciplinary GI conference this morning. She had some additional findings on her PET scan including an apparent left adrenal metastasis as well as an area of hypermetabolic activity for which a colonoscopy was recommended. Additionally, place and is made to face change the patient's stent. Dr. Christella Hartigan however has been unable to proceed with because the patient is felt to be at high risk for anesthesia and she is undergoing further cardiac workup.  She states that she has been stable clinically. She continues to have some pain as was discussed previously which is a somewhat poorly localized pain in the upper abdomen. She also states that she had some back pain. It was felt that she would benefit from palliative radiotherapy and she presents today for further discussion and coordination of this.                            ALLERGIES:  is allergic to betadine.  Meds: Current Outpatient Prescriptions  Medication Sig Dispense Refill  . amLODipine (NORVASC) 10 MG tablet Take 1 tablet (10 mg total) by mouth daily.  30 tablet  0  . gabapentin (NEURONTIN) 300 MG capsule Take 300 mg by mouth 3 (three) times daily.      Marland Kitchen HYDROmorphone (DILAUDID) 4 MG tablet Take 0.5-1 tablets (2-4 mg total) by mouth every 4 (four) hours as needed for pain.  30 tablet  0  . ibuprofen (ADVIL) 200 MG tablet Take 600 mg by mouth every 6 (six) hours as needed for pain.      Marland Kitchen insulin aspart (NOVOLOG) 100 UNIT/ML injection Inject 10 Units into the skin 3 (three) times daily with meals.      . insulin glargine (LANTUS) 100 UNIT/ML  injection Inject 20 Units into the skin at bedtime.      Marland Kitchen lisinopril (PRINIVIL,ZESTRIL) 20 MG tablet Take 1 tablet (20 mg total) by mouth daily.  30 tablet  0  . potassium chloride (K-DUR,KLOR-CON) 10 MEQ tablet Take 10 mEq by mouth daily.      . simvastatin (ZOCOR) 10 MG tablet Take 10 mg by mouth at bedtime.      Marland Kitchen MOVIPREP 100 G SOLR Take 1 kit (200 g total) by mouth once.  1 kit  0  . ondansetron (ZOFRAN ODT) 4 MG disintegrating tablet Take 1 tablet (4 mg total) by mouth every 8 (eight) hours as needed for nausea.  20 tablet  0   No current facility-administered medications for this encounter.    Physical Findings: The patient is in no acute distress. Patient is alert and oriented.  height is 5\' 3"  (1.6 m) and weight is 151 lb 6.4 oz (68.675 kg). Her temperature is 98.7 F (37.1 C). Her blood pressure is 146/76 and her pulse is 79. .     Lab Findings: Lab Results  Component Value Date   WBC 8.0 06/06/2013   HGB 12.8 06/06/2013   HCT 39.4 06/06/2013   MCV 87.2 06/06/2013   PLT 244 06/06/2013     Radiographic Findings: Ct Abdomen Pelvis W Contrast  05/29/2013   *RADIOLOGY REPORT*  Clinical Data: Upper abdominal pain radiating to the chest. Diagnosed 1 week ago with pancreatic cancer.  Weight gain with abdominal swelling.  CT ABDOMEN AND PELVIS WITH CONTRAST  Technique:  Multidetector CT imaging of the abdomen and pelvis was performed following the standard protocol during bolus administration of intravenous contrast.  Contrast: OMNIPAQUE IOHEXOL 300 MG/ML  SOLN  Comparison: MRI / MRCP 05/22/2013  Findings: Mild dependent atelectasis in the lung bases.  There is a poorly defined hypodense 3 x 4.3 cm diameter mass in the head of the pancreas with infiltration in the surrounding fat planes between the pancreas and duodenum and with partial encasement of the celiac axis.  Celiac axis lymphadenopathy. Proximal pancreatic ductal dilatation.  Changes are consistent with pancreatic carcinoma  with local extension and local lymph node metastasis.  A biliary stent is in place.  There is mild prominence of intrahepatic bile ducts.  There is residual contrast material filling the gallbladder and cystic duct.  This may result from previous ERCP on 05/24/2013 or could represent vicarious excretion. Cystic duct obstruction is not excluded.  The liver parenchyma appears homogeneous and no liver metastases are suggested.  Spleen size is normal.  Small accessory spleen.  No adrenal gland nodules. Cyst in the left kidney measures 16 mm diameter.  There is mild wall thickening.  No solid mass or hydronephrosis in the kidneys. Calcification of the abdominal aorta without aneurysm.  Inferior vena cava is normal in caliber.  The stomach and small bowel are not abnormally distended.  There is some wall thickening of the distal stomach and proximal duodenum which could be due to tumor infiltration or reactive inflammation.  Stool filled colon without distension.  No free air or free fluid in the abdomen.  Abdominal wall musculature appears intact.  Pelvis:  Calcification in the uterus consistent with fibroids.  No abnormal adnexal masses.  Bladder wall is not thickened.  Stool filled rectosigmoid colon without evidence of diverticulitis. Appendix is not identified.  The no free or loculated pelvic fluid collections.  No significant pelvic lymphadenopathy.  Degenerative changes in the spine.  No destructive or expansile bone lesions are appreciated.  IMPRESSION: Poorly defined mass in the head of the pancreas with extension into surrounding fat, partial encasement of the celiac axis, and with adjacent celiac axis and peripancreatic lymph nodes enlarged. Pancreatic ductal dilatation.  Biliary stent decompresses the bile ducts.  Residual contrast material filling the gallbladder and cystic duct. This may represent vicarious excretion, but cystic duct obstruction is not excluded.   Original Report Authenticated By: Burman Nieves, M.D.   Nm Pet Image Initial (pi) Skull Base To Thigh  06/09/2013   *RADIOLOGY REPORT*  Clinical Data: Initial treatment strategy for pancreatic cancer.  NUCLEAR MEDICINE PET SKULL BASE TO THIGH  Fasting Blood Glucose:  82  Technique:  14.5 mCi F-18 FDG was injected intravenously. CT data was obtained and used for attenuation correction and anatomic localization only.  (This was not acquired as a diagnostic CT examination.) Additional exam technical data entered on technologist worksheet.  Comparison:  CT of the abdomen and pelvis 05/28/2013.  Findings:  Neck: No hypermetabolic lymph nodes in the neck.  Chest:  No hypermetabolic mediastinal or hilar nodes.  No suspicious pulmonary nodules on the CT scan. Heart size is mildly enlarged. There is no significant pericardial fluid, thickening or pericardial calcification. There is atherosclerosis of the thoracic aorta, the great vessels of the mediastinum and the  coronary arteries, including calcified atherosclerotic plaque in the left main, left anterior descending, left circumflex and right coronary arteries. Esophagus is unremarkable in appearance.  Linear opacity in the anterior segment of the lingula may represent subsegmental atelectasis or scarring.  Abdomen/Pelvis:  A poorly defined mass is noted throughout the head and body of the pancreas, similar to the prior examination.  This is diffusely hypermetabolic (SUVmax = 8.6).  This mass measures approximately 7.1 x 3.3 cm on today's examination.  A stent is present within the common bile duct.  There is extensive hypermetabolism around the stent, which may be indicative of tumor, or may be indicative of reactive inflammation (SUVmax = 7.7 - 7.8). A large amount of residual contrast material is noted within the gallbladder, presumably related to prior ERCP performed on the 05/24/2013.  The persistence of this contrast within the gallbladder at this time may suggest persistent obstruction of the cystic  duct despite stent placement.  The gallbladder wall does appear mildly thickened measuring up to 5 mm.  No definite pericholecystic fluid.  However, there is diffuse hypermetabolism in the liver parenchyma around the gallbladder fossa (SUVmax = 8.5)which may suggest reactive inflammation.  The previously described pancreatic mass appears to completely involve the superior mesenteric vein and splenoportal confluence but is separate from the superior mesenteric artery at this time. Numerous mildly enlarged and hypermetabolic lymph nodes are noted in the gastrohepatic ligament.  No definite focal hepatic lesions are noted at this time.  The appearance of the spleen is unremarkable, although there is a splenule in the splenic hilum. Thickening of the left adrenal gland, with a focus of hypermetabolism in the medial limb that is somewhat nodular in appearance (SUVmax = 5.5)measuring approximately 1.4 cm in diameter, concerning for potential adrenal metastasis.  The appearance of the right kidney is unremarkable.  There is an exophytic low attenuation lesion which appears to have thickened walls, but is not hypermetabolic extending from the anterior aspect of the interpolar region of the left kidney, favored to represent a cyst.  Atherosclerosis throughout the abdominal and pelvic vasculature, without definite aneurysm.  Numerous colonic diverticula are noted, without surrounding inflammatory changes to suggest acute diverticulitis at this time; the majority of diverticula are right- sided.  In addition, in the ascending colon best demonstrated on images 153 - 168 of series 2 there is some marked colonic wall thickening that is somewhat mass-like in appearance and associated with some hypermetabolism (SUVmax = 6.1), potentially concerning for colonic neoplasm. Small calcified fibroid in the uterine fundus.  Ovaries are unremarkable in appearance.  Urinary bladder is unremarkable in appearance.  Skeleton:  No focal  hypermetabolic activity to suggest skeletal metastasis.  IMPRESSION: 1.  7.1 x 3.3 cm mass involving the head and body of the pancreas with evidence of involvement of the superior mesenteric vein and splenoportal confluence, as well as potential involvement of the common bile duct, as above.  There is also some metastatic lymphadenopathy in the gastrohepatic ligament. 2.  Large amount of residual contrast material within the gallbladder in this patient with prior history of ERCP on 05/24/2013.  Given this finding, the gallbladder wall thickening, and hypermetabolism surrounding the gallbladder within the gallbladder fossa of the liver, some degree of obstruction of the cystic duct is suspected.  Whether this is because of tumor or inflammation is uncertain.  However, clinical correlation for signs and symptoms of cholecystitis is recommended at this time, and further evaluation with ultrasound may be warranted if clinically indicated.  3.  1.4 cm hypermetabolic nodule in the medial limb of the left adrenal gland concerning for a metastatic lesion. 4.  Mass-like thickening in the ascending colon with hypermetabolism, as above.  Although there is a possibility that this thickening may simply be related to chronic diverticulosis (no current findings to suggest acute diverticulitis at this time), an underlying colonic mass is suspected, and correlation with colonoscopy is recommended when clinically feasible. 5.  Atherosclerosis, including left main and three-vessel coronary artery disease. 6.  Additional findings, as above.  These results will be called to the ordering clinician or representative by the Radiologist Assistant, and communication documented in the PACS Dashboard.   Original Report Authenticated By: Trudie Reed, M.D.    Impression/ plan:    The patient is appropriate to proceed with palliative radiotherapy. We will go ahead and plan for this and address other issues as noted above which were raised  on her PET scan as additional information is obtained. The patient is scheduled for simulation tomorrow.  I spent 15 minutes with the patient today, the majority of which was spent counseling the patient on the diagnosis of cancer and coordinating care.   Radene Gunning, M.D., Ph.D.

## 2013-06-23 NOTE — Progress Notes (Signed)
Please see the Nurse Progress Note in the MD Initial Consult Encounter for this patient. 

## 2013-06-23 NOTE — Telephone Encounter (Signed)
gv and printed appt sched and avs for pt  °

## 2013-06-23 NOTE — Progress Notes (Signed)
OFFICE PROGRESS NOTE  Interval history:  [name] [name] returns as scheduled. She has continued back and abdominal pain. She has recently had bilateral leg pain as well. She takes Dilaudid as needed. She denies nausea/vomiting. Bowels overall have been moving regularly. No bladder problems. She has a good appetite.   Objective: Blood pressure 175/83, pulse 79, temperature 97.9 F (36.6 C), temperature source Oral, resp. rate 20, height 5\' 3"  (1.6 m), weight 152 lb 9.6 oz (69.219 kg).  Oropharynx is without thrush or ulceration. Lungs are clear. Regular cardiac rhythm. Abdomen is soft. She is tender at the right abdomen. No mass. Extremities are without edema. Calves are soft and nontender.  Lab Results: Lab Results  Component Value Date   WBC 8.0 06/06/2013   HGB 12.8 06/06/2013   HCT 39.4 06/06/2013   MCV 87.2 06/06/2013   PLT 244 06/06/2013    Chemistry:    Chemistry      Component Value Date/Time   NA 138 06/06/2013 0159   K 3.9 06/06/2013 0159   CL 104 06/06/2013 0159   CO2 25 06/06/2013 0159   BUN 26* 06/06/2013 0159   CREATININE 0.81 06/06/2013 0159      Component Value Date/Time   CALCIUM 10.0 06/06/2013 0159   ALKPHOS 190* 06/06/2013 0159   AST 35 06/06/2013 0159   ALT 38* 06/06/2013 0159   BILITOT 0.7 06/06/2013 0159       Studies/Results: Ct Abdomen Pelvis W Contrast  05/29/2013   *RADIOLOGY REPORT*  Clinical Data: Upper abdominal pain radiating to the chest. Diagnosed 1 week ago with pancreatic cancer.  Weight gain with abdominal swelling.  CT ABDOMEN AND PELVIS WITH CONTRAST  Technique:  Multidetector CT imaging of the abdomen and pelvis was performed following the standard protocol during bolus administration of intravenous contrast.  Contrast: OMNIPAQUE IOHEXOL 300 MG/ML  SOLN  Comparison: MRI / MRCP 05/22/2013  Findings: Mild dependent atelectasis in the lung bases.  There is a poorly defined hypodense 3 x 4.3 cm diameter mass in the head of the pancreas with infiltration  in the surrounding fat planes between the pancreas and duodenum and with partial encasement of the celiac axis.  Celiac axis lymphadenopathy. Proximal pancreatic ductal dilatation.  Changes are consistent with pancreatic carcinoma with local extension and local lymph node metastasis.  A biliary stent is in place.  There is mild prominence of intrahepatic bile ducts.  There is residual contrast material filling the gallbladder and cystic duct.  This may result from previous ERCP on 05/24/2013 or could represent vicarious excretion. Cystic duct obstruction is not excluded.  The liver parenchyma appears homogeneous and no liver metastases are suggested.  Spleen size is normal.  Small accessory spleen.  No adrenal gland nodules. Cyst in the left kidney measures 16 mm diameter.  There is mild wall thickening.  No solid mass or hydronephrosis in the kidneys. Calcification of the abdominal aorta without aneurysm.  Inferior vena cava is normal in caliber.  The stomach and small bowel are not abnormally distended.  There is some wall thickening of the distal stomach and proximal duodenum which could be due to tumor infiltration or reactive inflammation.  Stool filled colon without distension.  No free air or free fluid in the abdomen.  Abdominal wall musculature appears intact.  Pelvis:  Calcification in the uterus consistent with fibroids.  No abnormal adnexal masses.  Bladder wall is not thickened.  Stool filled rectosigmoid colon without evidence of diverticulitis. Appendix is not identified.  The no free or loculated pelvic fluid collections.  No significant pelvic lymphadenopathy.  Degenerative changes in the spine.  No destructive or expansile bone lesions are appreciated.  IMPRESSION: Poorly defined mass in the head of the pancreas with extension into surrounding fat, partial encasement of the celiac axis, and with adjacent celiac axis and peripancreatic lymph nodes enlarged. Pancreatic ductal dilatation.  Biliary  stent decompresses the bile ducts.  Residual contrast material filling the gallbladder and cystic duct. This may represent vicarious excretion, but cystic duct obstruction is not excluded.   Original Report Authenticated By: Burman Nieves, M.D.   Nm Pet Image Initial (pi) Skull Base To Thigh  06/09/2013   *RADIOLOGY REPORT*  Clinical Data: Initial treatment strategy for pancreatic cancer.  NUCLEAR MEDICINE PET SKULL BASE TO THIGH  Fasting Blood Glucose:  82  Technique:  14.5 mCi F-18 FDG was injected intravenously. CT data was obtained and used for attenuation correction and anatomic localization only.  (This was not acquired as a diagnostic CT examination.) Additional exam technical data entered on technologist worksheet.  Comparison:  CT of the abdomen and pelvis 05/28/2013.  Findings:  Neck: No hypermetabolic lymph nodes in the neck.  Chest:  No hypermetabolic mediastinal or hilar nodes.  No suspicious pulmonary nodules on the CT scan. Heart size is mildly enlarged. There is no significant pericardial fluid, thickening or pericardial calcification. There is atherosclerosis of the thoracic aorta, the great vessels of the mediastinum and the coronary arteries, including calcified atherosclerotic plaque in the left main, left anterior descending, left circumflex and right coronary arteries. Esophagus is unremarkable in appearance.  Linear opacity in the anterior segment of the lingula may represent subsegmental atelectasis or scarring.  Abdomen/Pelvis:  A poorly defined mass is noted throughout the head and body of the pancreas, similar to the prior examination.  This is diffusely hypermetabolic (SUVmax = 8.6).  This mass measures approximately 7.1 x 3.3 cm on today's examination.  A stent is present within the common bile duct.  There is extensive hypermetabolism around the stent, which may be indicative of tumor, or may be indicative of reactive inflammation (SUVmax = 7.7 - 7.8). A large amount of residual  contrast material is noted within the gallbladder, presumably related to prior ERCP performed on the 05/24/2013.  The persistence of this contrast within the gallbladder at this time may suggest persistent obstruction of the cystic duct despite stent placement.  The gallbladder wall does appear mildly thickened measuring up to 5 mm.  No definite pericholecystic fluid.  However, there is diffuse hypermetabolism in the liver parenchyma around the gallbladder fossa (SUVmax = 8.5)which may suggest reactive inflammation.  The previously described pancreatic mass appears to completely involve the superior mesenteric vein and splenoportal confluence but is separate from the superior mesenteric artery at this time. Numerous mildly enlarged and hypermetabolic lymph nodes are noted in the gastrohepatic ligament.  No definite focal hepatic lesions are noted at this time.  The appearance of the spleen is unremarkable, although there is a splenule in the splenic hilum. Thickening of the left adrenal gland, with a focus of hypermetabolism in the medial limb that is somewhat nodular in appearance (SUVmax = 5.5)measuring approximately 1.4 cm in diameter, concerning for potential adrenal metastasis.  The appearance of the right kidney is unremarkable.  There is an exophytic low attenuation lesion which appears to have thickened walls, but is not hypermetabolic extending from the anterior aspect of the interpolar region of the left kidney, favored to represent  a cyst.  Atherosclerosis throughout the abdominal and pelvic vasculature, without definite aneurysm.  Numerous colonic diverticula are noted, without surrounding inflammatory changes to suggest acute diverticulitis at this time; the majority of diverticula are right- sided.  In addition, in the ascending colon best demonstrated on images 153 - 168 of series 2 there is some marked colonic wall thickening that is somewhat mass-like in appearance and associated with some  hypermetabolism (SUVmax = 6.1), potentially concerning for colonic neoplasm. Small calcified fibroid in the uterine fundus.  Ovaries are unremarkable in appearance.  Urinary bladder is unremarkable in appearance.  Skeleton:  No focal hypermetabolic activity to suggest skeletal metastasis.  IMPRESSION: 1.  7.1 x 3.3 cm mass involving the head and body of the pancreas with evidence of involvement of the superior mesenteric vein and splenoportal confluence, as well as potential involvement of the common bile duct, as above.  There is also some metastatic lymphadenopathy in the gastrohepatic ligament. 2.  Large amount of residual contrast material within the gallbladder in this patient with prior history of ERCP on 05/24/2013.  Given this finding, the gallbladder wall thickening, and hypermetabolism surrounding the gallbladder within the gallbladder fossa of the liver, some degree of obstruction of the cystic duct is suspected.  Whether this is because of tumor or inflammation is uncertain.  However, clinical correlation for signs and symptoms of cholecystitis is recommended at this time, and further evaluation with ultrasound may be warranted if clinically indicated. 3.  1.4 cm hypermetabolic nodule in the medial limb of the left adrenal gland concerning for a metastatic lesion. 4.  Mass-like thickening in the ascending colon with hypermetabolism, as above.  Although there is a possibility that this thickening may simply be related to chronic diverticulosis (no current findings to suggest acute diverticulitis at this time), an underlying colonic mass is suspected, and correlation with colonoscopy is recommended when clinically feasible. 5.  Atherosclerosis, including left main and three-vessel coronary artery disease. 6.  Additional findings, as above.  These results will be called to the ordering clinician or representative by the Radiologist Assistant, and communication documented in the PACS Dashboard.   Original  Report Authenticated By: Trudie Reed, M.D.    Medications: I have reviewed the patient's current medications.  Assessment/Plan:  1. Adenocarcinoma of the pancreas presenting with an obstructing pancreas head mass, clinical stage II versus 3 (T3-T4, N1); potential abutment of the celiac axis noted on the MRI abdomen 05/22/2013. 2. Obstructive jaundice secondary to #1. Status post placement of a bile duct stent on 05/24/2013. 3. Pain secondary to pancreas cancer. Controlled with hydromorphone. 4. Diabetes. 5. History of chronic pancreatitis. 6. History of alcohol use. 7. History of tobacco use. 8. Anxiety/depression. 9. Cutaneous nodule at the left flank. Question cyst, question metastasis. 10. Abnormal EKG, question Brugada syndrome.  Disposition-she is scheduled for radiation simulation 06/24/2013. We anticipate she will begin the course of radiation on 07/05/2013. Dr. Truett Perna recommends concurrent Xeloda chemotherapy. We reviewed potential toxicities associated with Xeloda including mouth sores, nausea, diarrhea, hand-foot syndrome, skin hyperpigmentation. She understands she will take the Xeloda on days of radiation only. We will see her back approximately 2 weeks into radiation/Xeloda. She will contact the office in the interim with any problems.   Of note, we have asked our pharmacy to look into any issues with Xeloda and possible Brugada syndrome.  Plan reviewed with Dr. Truett Perna.  Lonna Cobb ANP/GNP-BC

## 2013-06-23 NOTE — Telephone Encounter (Signed)
Patient asked for laxative due to use of narcotics for pain.  Verbal order received and read back for Miralax 17 g daily.

## 2013-06-23 NOTE — Progress Notes (Addendum)
GI Location of Tumor / Histology: pancreatic head mass - adenocarcinoma   Patient presented with symptoms of: worsening of abdominal pain and vomiting. Also had dark urine.   Brushings of common bile duct (if applicable) revealed: COMMON BILE DUCT BRUSHING:  MALIGNANT CELLS PRESENT CONSISTENT WITH ADENOCARCINOMA.  Past/Anticipated interventions by surgeon, if any: ERCP with CBD stent placement 05/24/2013   Past/Anticipated interventions by medical oncology, if any: concurrent Xeloda and radiation per Dr. Truett Perna on 06/08/2013.   Weight changes, if any: 20 lbs loss in 3 months  Has lost 7 lbs since 06/17/2013.  Bowel/Bladder complaints, if any: none  Nausea / Vomiting, if any: none  Pain issues, if any: burning in abdomen and pain on left side and bil leg pain. Taking advil and diludid.  SAFETY ISSUES:  Prior radiation? no  Pacemaker/ICD? no  Possible current pregnancy? no  Is the patient on methotrexate? No   Current Complaints / other details: Pet scan done 06/09/2013.  [name] [name] is here with her daughter and grandsons.  She reports having trouble with his balance and with leg pain.  She states she is weak.   She said that she is supposed to have a colonoscopy tomorrow.  She says it is up in the air as to whether she will have it.  She reports a "weird" feeling in her rectum like she has colon polyps.

## 2013-06-24 ENCOUNTER — Encounter (HOSPITAL_COMMUNITY): Admission: RE | Payer: Self-pay | Source: Ambulatory Visit

## 2013-06-24 ENCOUNTER — Ambulatory Visit: Payer: Medicaid Other | Admitting: Radiation Oncology

## 2013-06-24 ENCOUNTER — Ambulatory Visit (HOSPITAL_COMMUNITY): Admission: RE | Admit: 2013-06-24 | Payer: Medicaid Other | Source: Ambulatory Visit | Admitting: Gastroenterology

## 2013-06-24 ENCOUNTER — Telehealth: Payer: Self-pay | Admitting: *Deleted

## 2013-06-24 ENCOUNTER — Other Ambulatory Visit: Payer: Medicaid Other

## 2013-06-24 SURGERY — COLONOSCOPY WITH PROPOFOL
Anesthesia: General

## 2013-06-24 NOTE — Telephone Encounter (Signed)
Spoke with pt

## 2013-06-25 ENCOUNTER — Encounter: Payer: Self-pay | Admitting: *Deleted

## 2013-06-25 NOTE — Progress Notes (Signed)
Clinical Social Work received referral for transportation assistance.  CSW spoke with patient by phone, [name] [name] states she is in need of help getting to medical appointments.  CSW provided patient with information on [facility] transportation and encouraged patient to set up transportation services today.  Patient verbalized understanding and plans to contact CSW with any additional questions or concerns.  Kathrin Penner, MSW, LCSW Clinical Social Worker [facility] [phone]

## 2013-06-28 ENCOUNTER — Other Ambulatory Visit: Payer: Self-pay | Admitting: *Deleted

## 2013-06-28 DIAGNOSIS — C259 Malignant neoplasm of pancreas, unspecified: Secondary | ICD-10-CM

## 2013-06-28 MED ORDER — CAPECITABINE 500 MG PO TABS: 1500.0000 mg | ORAL_TABLET | Freq: Two times a day (BID) | ORAL | Status: DC

## 2013-06-30 ENCOUNTER — Ambulatory Visit
Admission: RE | Admit: 2013-06-30 | Discharge: 2013-06-30 | Disposition: A | Discharge status: Home / Self Care | Payer: Medicaid Other | Source: Ambulatory Visit | Attending: Radiation Oncology | Admitting: Radiation Oncology

## 2013-06-30 DIAGNOSIS — R5383 Other fatigue: Secondary | ICD-10-CM | POA: Insufficient documentation

## 2013-06-30 DIAGNOSIS — Z510 Encounter for antineoplastic radiation therapy: Secondary | ICD-10-CM | POA: Insufficient documentation

## 2013-06-30 DIAGNOSIS — R5381 Other malaise: Secondary | ICD-10-CM | POA: Insufficient documentation

## 2013-06-30 DIAGNOSIS — R197 Diarrhea, unspecified: Secondary | ICD-10-CM | POA: Insufficient documentation

## 2013-06-30 DIAGNOSIS — R142 Eructation: Secondary | ICD-10-CM | POA: Insufficient documentation

## 2013-06-30 DIAGNOSIS — R110 Nausea: Secondary | ICD-10-CM | POA: Insufficient documentation

## 2013-06-30 DIAGNOSIS — C250 Malignant neoplasm of head of pancreas: Secondary | ICD-10-CM

## 2013-06-30 DIAGNOSIS — R141 Gas pain: Secondary | ICD-10-CM | POA: Insufficient documentation

## 2013-06-30 DIAGNOSIS — R109 Unspecified abdominal pain: Secondary | ICD-10-CM | POA: Insufficient documentation

## 2013-06-30 DIAGNOSIS — C259 Malignant neoplasm of pancreas, unspecified: Secondary | ICD-10-CM | POA: Insufficient documentation

## 2013-06-30 DIAGNOSIS — R143 Flatulence: Secondary | ICD-10-CM | POA: Insufficient documentation

## 2013-06-30 NOTE — Telephone Encounter (Signed)
Received confirmation of prescription shipment fax from [employer] informing that capecitabin was shipped 06/29/13 for delivery next business day.

## 2013-07-01 ENCOUNTER — Telehealth: Payer: Self-pay | Admitting: *Deleted

## 2013-07-01 NOTE — Telephone Encounter (Signed)
Attempted to reach patient without success.  Left voice message to return call back to this RN.  Contact name and number left.

## 2013-07-05 ENCOUNTER — Telehealth: Payer: Self-pay | Admitting: Oncology

## 2013-07-05 ENCOUNTER — Other Ambulatory Visit (HOSPITAL_COMMUNITY): Payer: Medicaid Other

## 2013-07-05 NOTE — Telephone Encounter (Signed)
Received a message from Faith, Radiation Therapist on [place] [address].  She said Ms. Savitt called and said that she will not be able to have radiation treatments because her daughter will be moving and she is going with her.  She is supposed to start on 07/08/2013.  Called Ms. Eisman and left her a voicemail message to call back at [phone] to discuss this.

## 2013-07-05 NOTE — Telephone Encounter (Signed)
Arma Heading returned my call.  She said that her family is moving to [place] so she will not be able to have radiation treatments.  I told her that I would notify Dr. Mitzi Hansen.

## 2013-07-08 ENCOUNTER — Other Ambulatory Visit: Payer: Self-pay | Admitting: Radiation Oncology

## 2013-07-08 ENCOUNTER — Ambulatory Visit: Payer: Medicaid Other | Admitting: Radiation Oncology

## 2013-07-08 DIAGNOSIS — C801 Malignant (primary) neoplasm, unspecified: Secondary | ICD-10-CM

## 2013-07-09 ENCOUNTER — Telehealth: Payer: Self-pay | Admitting: *Deleted

## 2013-07-09 ENCOUNTER — Ambulatory Visit: Payer: Medicaid Other

## 2013-07-09 NOTE — Telephone Encounter (Signed)
Message from [place] with [employer] stating pt has not started Xeloda, pt and daughter are unsure of Xeloda instructions. Pt's chemo class had been rescheduled to 8/26. Pt canceled radiation appts stating she was moving. GI navigator made aware, pt currently scheduled to see NP on 8/28. Spoke with pt's daughter she stated they plan to stay in town until pt's treatment is complete. Per Dr. Truett Perna, follow up as scheduled. Have pt rescheduled for radiation. Spoke with Elnita Maxwell, RN in radiation, she will make Dr. Mitzi Hansen aware.

## 2013-07-12 ENCOUNTER — Telehealth: Payer: Self-pay | Admitting: *Deleted

## 2013-07-12 ENCOUNTER — Ambulatory Visit: Payer: Medicaid Other

## 2013-07-12 ENCOUNTER — Ambulatory Visit: Payer: Medicaid Other | Admitting: Radiation Oncology

## 2013-07-12 NOTE — Telephone Encounter (Signed)
Attempted to reach patient at all numbers listed without success.  Left voice message with name and number to please return call.

## 2013-07-12 NOTE — Telephone Encounter (Signed)
Attempted to reach patient at all numbers listed.  Left voice message and contact number to please return phone call.

## 2013-07-12 NOTE — Telephone Encounter (Signed)
[employer] representative called patient to follow up on symptom management in regards to Xeloda. Family reports she has not started the medication yet and does not know when she is supposed to begin the medication. Told rep that they don't know what is going on and seemed upset. Wanted our office aware of her conversation with them.  Patient has appointment on 8/28 with chemo teaching class on 8/26. Will make GI Nurse Coordinator aware of call.

## 2013-07-13 ENCOUNTER — Encounter: Payer: Self-pay | Admitting: *Deleted

## 2013-07-13 ENCOUNTER — Ambulatory Visit: Payer: Medicaid Other

## 2013-07-13 NOTE — Progress Notes (Signed)
Clinical Social Worker met with pt and pt's daughter at [facility] to assess for needs.  Pt had previously been referred to [facility].  Pt's daughter called while at [facility] to confirm transportation for her upcoming appointments.  Pt also expressed concerns for adjusting to her diagnosis and "new way of life".  Pt also expressed feeling overwhelmed.  CSW and pt reviewed available resources and support.  A member of the CSW team plans to meet with pt on Thursday at her next appointment to review other community resources and support groups.    Tamala Julian, MSW, LCSW Clinical Social Worker [facility] [phone]

## 2013-07-13 NOTE — Progress Notes (Signed)
Met with patient and daughter, along with SW, during chemo teaching class.  Patient stated she wants to receive her chemo and radiation treatments here.  She has already received her Xeloda through [facility].  She has an appoointment on 07/15/13 with Dr. Dominga Ferry.  She will be seen by dietician at that appointment.  Her transportation has been arranged by SW through [facility].  Dr. Mitzi Hansen and Dr. Truett Perna notified of patient's desire to begin treatment.  Note sent to Dr. Mitzi Hansen requesting XRT appointments be re-scheduled.  Patient instructed not to take Xeloda until she is given a start date by Dr. Sherrill/Dr. Mitzi Hansen.  She was instructed to bring her medication with her at next visit.  Patient and daughter verbalized comprehension and were with out further questions.

## 2013-07-14 ENCOUNTER — Telehealth: Payer: Self-pay | Admitting: Oncology

## 2013-07-14 ENCOUNTER — Ambulatory Visit: Payer: Medicaid Other

## 2013-07-14 NOTE — Telephone Encounter (Addendum)
Called and spoke to [employer].  Advised her that her first radiation treatment will be on 07/20/2013 at 3:45.  Also let her know that a full schedule will be given to her at her appointment with Dr. Alcide Evener tomorrow.

## 2013-07-15 ENCOUNTER — Other Ambulatory Visit (HOSPITAL_BASED_OUTPATIENT_CLINIC_OR_DEPARTMENT_OTHER): Payer: Medicaid Other | Admitting: Lab

## 2013-07-15 ENCOUNTER — Ambulatory Visit (HOSPITAL_COMMUNITY)
Admission: RE | Admit: 2013-07-15 | Discharge: 2013-07-15 | Disposition: A | Discharge status: Home / Self Care | Payer: Medicaid Other | Source: Ambulatory Visit | Attending: Nurse Practitioner | Admitting: Nurse Practitioner

## 2013-07-15 ENCOUNTER — Telehealth: Payer: Self-pay

## 2013-07-15 ENCOUNTER — Ambulatory Visit: Payer: Medicaid Other

## 2013-07-15 ENCOUNTER — Encounter: Payer: Self-pay | Admitting: *Deleted

## 2013-07-15 ENCOUNTER — Ambulatory Visit (HOSPITAL_BASED_OUTPATIENT_CLINIC_OR_DEPARTMENT_OTHER): Payer: Medicaid Other | Admitting: Nurse Practitioner

## 2013-07-15 ENCOUNTER — Ambulatory Visit: Payer: Medicaid Other | Admitting: Nutrition

## 2013-07-15 VITALS — BP 154/82 | HR 74 | Temp 97.1°F | Resp 20 | Ht 63.0 in | Wt 150.4 lb

## 2013-07-15 DIAGNOSIS — C259 Malignant neoplasm of pancreas, unspecified: Secondary | ICD-10-CM

## 2013-07-15 DIAGNOSIS — R17 Unspecified jaundice: Secondary | ICD-10-CM

## 2013-07-15 DIAGNOSIS — M1610 Unilateral primary osteoarthritis, unspecified hip: Secondary | ICD-10-CM | POA: Insufficient documentation

## 2013-07-15 DIAGNOSIS — M79609 Pain in unspecified limb: Secondary | ICD-10-CM | POA: Insufficient documentation

## 2013-07-15 DIAGNOSIS — M169 Osteoarthritis of hip, unspecified: Secondary | ICD-10-CM | POA: Insufficient documentation

## 2013-07-15 DIAGNOSIS — I10 Essential (primary) hypertension: Secondary | ICD-10-CM

## 2013-07-15 LAB — CBC WITH DIFFERENTIAL/PLATELET
BASO%: 0.4 % (ref 0.0–2.0)
EOS%: 4.8 % (ref 0.0–7.0)
HGB: 12.4 g/dL (ref 11.6–15.9)
MCH: 28.4 pg (ref 25.1–34.0)
MCHC: 32.8 g/dL (ref 31.5–36.0)
MCV: 86.7 fL (ref 79.5–101.0)
MONO%: 5.9 % (ref 0.0–14.0)
RBC: 4.36 10*6/uL (ref 3.70–5.45)
RDW: 14.7 % — ABNORMAL HIGH (ref 11.2–14.5)
lymph#: 2.6 10*3/uL (ref 0.9–3.3)

## 2013-07-15 LAB — COMPREHENSIVE METABOLIC PANEL (CC13)
ALT: 101 U/L — ABNORMAL HIGH (ref 0–55)
AST: 40 U/L — ABNORMAL HIGH (ref 5–34)
Albumin: 3.8 g/dL (ref 3.5–5.0)
Alkaline Phosphatase: 362 U/L — ABNORMAL HIGH (ref 40–150)
Calcium: 10 mg/dL (ref 8.4–10.4)
Chloride: 108 mEq/L (ref 98–109)
Potassium: 3.8 mEq/L (ref 3.5–5.1)

## 2013-07-15 MED ORDER — AMLODIPINE BESYLATE 10 MG PO TABS: 10.0000 mg | ORAL_TABLET | Freq: Every day | ORAL | Status: DC

## 2013-07-15 MED ORDER — POLYETHYLENE GLYCOL 3350 17 GM/SCOOP PO POWD: 17.0000 g | Freq: Every day | ORAL | Status: DC

## 2013-07-15 MED ORDER — HYDROMORPHONE HCL 4 MG PO TABS: 2.0000 mg | ORAL_TABLET | ORAL | Status: DC | PRN

## 2013-07-15 MED ORDER — LISINOPRIL 20 MG PO TABS: 20.0000 mg | ORAL_TABLET | Freq: Every day | ORAL | Status: DC

## 2013-07-15 NOTE — Progress Notes (Signed)
[facility] Clinical Social Work  Clinical Social Work received referral       Clinical Social Work interventions:  Kathrin Penner, MSW, LCSW Clinical Social Worker [facility] [phone]

## 2013-07-15 NOTE — Telephone Encounter (Signed)
gv and printed appt schedand avs for pt for Sept °

## 2013-07-15 NOTE — Progress Notes (Signed)
Met with patient to review her schedule and answer questions re: taking Xeloda.  She has received her medication.  Patient and her daughter verbalized medication instructions, including how much and when to take it.  She understands to begin taking the Xeloda first day of radiation, 07/20/13.  She understands to only take the Xeloda on the days of radiation.  She met with SW today to review arrangements for transportation and to discuss further social needs.  She met with the dietician today.  Patient and her daughter expressed appreciation for all of the services provided and stated they would call if they had any questions regarding schedules, treatment, etc.  Will continue to follow as needed.

## 2013-07-15 NOTE — Progress Notes (Signed)
Patient is a 57 year old female diagnosed with pancreatic cancer.  She is a patient of Dr. Truett Perna.  Past medical history includes diabetes, dyslipidemia, and hypertension.  Medications include Xeloda, NovoLog, Lantus, Zofran, MiraLax, K-Dur, and Zocor.  Labs were reviewed.  Height: 63 inches. Weight: 150.4 pounds. Usual body weight 155 pounds. BMI: 26.65.  Patient will begin treatment with radiation therapy on September 2.  She is scheduled to take her oral chemotherapy.  Patient has many questions regarding what to eat with her diabetes.  She also expresses frustration and concern regarding purchasing food and her lack of transportation.  Nutrition diagnosis: Food and nutrition related knowledge deficit related to new diagnosis of pancreatic cancer and associated treatments as evidenced by no prior need for nutrition related information.  Intervention: Patient was educated to consume small, frequent meals with adequate calories and protein to promote weight maintenance and adequate glycemic control.  I have encouraged patient to consume Glucerna or boost glucose control as needed.  I provided her with samples of this product and coupons.  I have given specific examples about how to pair foods for better glycemic control.  Fact sheets were provided.  Contact information given.  Teach back method used.  Monitoring, evaluation, goals: Patient will tolerate calories and protein to promote weight maintenance and adequate glycemic control.  Next visit: Patient will contact me with questions or concerns.

## 2013-07-15 NOTE — Progress Notes (Signed)
OFFICE PROGRESS NOTE  Interval history:  [name] [name] returns as scheduled. She has not started the course of radiation. She is scheduled to begin on 07/20/2013. She has Xeloda to begin that day as well.  She continues to have abdominal pain. She takes Dilaudid as needed. She reports increased pain/tenderness at the left hip and thigh. Appetite varies. No nausea or vomiting. Bowels overall moving regularly.   Objective: Blood pressure 154/82, pulse 74, temperature 97.1 F (36.2 C), temperature source Oral, resp. rate 20, height 5\' 3"  (1.6 m), weight 150 lb 6.4 oz (68.221 kg).  No thrush. Lungs are clear. Regular cardiac rhythm. Abdomen is soft with tenderness at the mid abdomen. No hepatomegaly. No mass. Extremities are without edema. Calves are soft and nontender. Tender over the left greater trochanter.  Lab Results: Lab Results  Component Value Date   WBC 7.5 07/15/2013   HGB 12.4 07/15/2013   HCT 37.8 07/15/2013   MCV 86.7 07/15/2013   PLT 199 07/15/2013    Chemistry:    Chemistry      Component Value Date/Time   NA 142 07/15/2013 1400   NA 138 06/06/2013 0159   K 3.8 07/15/2013 1400   K 3.9 06/06/2013 0159   CL 104 06/06/2013 0159   CO2 23 07/15/2013 1400   CO2 25 06/06/2013 0159   BUN 23.0 07/15/2013 1400   BUN 26* 06/06/2013 0159   CREATININE 0.8 07/15/2013 1400   CREATININE 0.81 06/06/2013 0159      Component Value Date/Time   CALCIUM 10.0 07/15/2013 1400   CALCIUM 10.0 06/06/2013 0159   ALKPHOS 362* 07/15/2013 1400   ALKPHOS 190* 06/06/2013 0159   AST 40* 07/15/2013 1400   AST 35 06/06/2013 0159   ALT 101* 07/15/2013 1400   ALT 38* 06/06/2013 0159   BILITOT 0.46 07/15/2013 1400   BILITOT 0.7 06/06/2013 0159       Studies/Results: No results found.  Medications: I have reviewed the patient's current medications.  Assessment/Plan:  1. Adenocarcinoma of the pancreas presenting with an obstructing pancreas head mass, clinical stage II versus III (T3-T4, N1); potential abutment  of the celiac axis noted on the MRI abdomen 05/22/2013. 2. Obstructive jaundice secondary to #1. Status post placement of a bile duct stent on 05/24/2013. 3. Pain secondary to pancreas cancer. Controlled with hydromorphone. 4. Diabetes. 5. History of chronic pancreatitis. 6. History of alcohol use. 7. History of tobacco use. 8. Anxiety/depression. 9. Cutaneous nodule at the left flank. Question cyst, question metastasis. 10. Abnormal EKG, question Brugada syndrome. 11. Pain/tenderness at the left greater trochanter. Question arthritis, question bursitis. Low clinical suspicion for metastatic disease involving the bone.  Disposition-[name] is scheduled to begin the course of radiation therapy on 07/20/2013. She understands to begin Xeloda that day as well. We again reviewed potential toxicities associated with Xeloda including mouth sores, nausea, diarrhea and hand-foot syndrome.  We are obtaining plain x-rays of the left hip and femur today.  She will return for a followup visit on 08/03/2013. She will contact the office in the interim with any problems.   She was given a new prescription for Dilaudid at today's visit as well as her blood pressure medications and MiraLAX.  Plan reviewed with Dr. Truett Perna.  Lonna Cobb ANP/GNP-BC

## 2013-07-16 ENCOUNTER — Ambulatory Visit: Payer: Medicaid Other

## 2013-07-20 ENCOUNTER — Telehealth: Payer: Self-pay | Admitting: *Deleted

## 2013-07-20 ENCOUNTER — Ambulatory Visit: Payer: Medicaid Other

## 2013-07-20 ENCOUNTER — Ambulatory Visit
Admission: RE | Admit: 2013-07-20 | Discharge: 2013-07-20 | Disposition: A | Discharge status: Home / Self Care | Payer: Medicaid Other | Source: Ambulatory Visit | Attending: Radiation Oncology | Admitting: Radiation Oncology

## 2013-07-20 NOTE — Telephone Encounter (Signed)
Received call from pt inquiring if she begins radiation treatment today. Informed pt she does begin radiation treatment today at 3:45 pm. Also advised her she has radiation treatment every Mon. through Friday until her treatments are complete. Pt asking about taking Xeloda. Advised she take every day she receives radiation. Pt verbalized understanding, gave correct verbal feedback.

## 2013-07-21 ENCOUNTER — Ambulatory Visit
Admission: RE | Admit: 2013-07-21 | Discharge: 2013-07-21 | Disposition: A | Discharge status: Home / Self Care | Payer: Medicaid Other | Source: Ambulatory Visit | Attending: Radiation Oncology | Admitting: Radiation Oncology

## 2013-07-21 ENCOUNTER — Ambulatory Visit
Admission: RE | Admit: 2013-07-21 | Discharge: 2013-07-21 | Disposition: A | Discharge status: Home / Self Care | Payer: Medicaid Other | Source: Ambulatory Visit | Admitting: Radiation Oncology

## 2013-07-21 ENCOUNTER — Ambulatory Visit: Payer: Medicaid Other

## 2013-07-21 VITALS — BP 176/75 | HR 63 | Temp 98.8°F | Ht 63.0 in | Wt 154.0 lb

## 2013-07-21 DIAGNOSIS — C259 Malignant neoplasm of pancreas, unspecified: Secondary | ICD-10-CM

## 2013-07-21 MED ORDER — RADIAPLEXRX EX GEL
Freq: Once | CUTANEOUS | Status: AC
  Administered 2013-07-21: 18:00:00 via TOPICAL

## 2013-07-21 NOTE — Progress Notes (Addendum)
Arma Heading here for weekly under treat visit.  She has had 2 fractions to her pancreas.  She is having pain in her legs that she is rating at a 7/10.  She says her pain medications are not helping.  She is also having nausea after treatments.  Recommended taking her zofran 1 hour before treatments and having something in her stomach before treatment.  She is also wondering about her xray results of her legs.  She was given the Radiation Therapy and You book and discussed the potential side effects of radiation including diarrhea, fatigue, nausea and skin changes.  She was given radiaplex gel and was instructed to apply it twice a day, in the morning and at bedtime.  She was instructed to contact nursing with any questions or concerns.

## 2013-07-22 ENCOUNTER — Ambulatory Visit
Admission: RE | Admit: 2013-07-22 | Discharge: 2013-07-22 | Disposition: A | Discharge status: Home / Self Care | Payer: Medicaid Other | Source: Ambulatory Visit | Attending: Radiation Oncology | Admitting: Radiation Oncology

## 2013-07-22 ENCOUNTER — Ambulatory Visit: Payer: Medicaid Other

## 2013-07-23 ENCOUNTER — Ambulatory Visit
Admission: RE | Admit: 2013-07-23 | Discharge: 2013-07-23 | Disposition: A | Discharge status: Home / Self Care | Payer: Medicaid Other | Source: Ambulatory Visit | Attending: Radiation Oncology | Admitting: Radiation Oncology

## 2013-07-23 ENCOUNTER — Ambulatory Visit: Payer: Medicaid Other

## 2013-07-23 ENCOUNTER — Telehealth: Payer: Self-pay | Admitting: Oncology

## 2013-07-23 VITALS — BP 146/87 | HR 64 | Temp 98.6°F | Ht 63.0 in | Wt 151.6 lb

## 2013-07-23 DIAGNOSIS — C250 Malignant neoplasm of head of pancreas: Secondary | ICD-10-CM

## 2013-07-23 NOTE — Progress Notes (Signed)
Department of Radiation Oncology  Phone:  [phone] Fax:        [phone]  Weekly Treatment Note    Name: [name] [name] Date: 07/23/2013 MRN: [id number] DOB: 13-Oct-1956   Current dose: 7.5 Gy  Current fraction: 3   MEDICATIONS: Current Outpatient Prescriptions  Medication Sig Dispense Refill  . amLODipine (NORVASC) 10 MG tablet Take 1 tablet (10 mg total) by mouth daily.  30 tablet  0  . capecitabine (XELODA) 500 MG tablet Take 3 tablets (1,500 mg total) by mouth 2 (two) times daily after a meal. On days of radiation only (Monday-Friday) 3000 mg total daily dose  168 tablet  0  . gabapentin (NEURONTIN) 300 MG capsule Take 300 mg by mouth 3 (three) times daily.      Marland Kitchen HYDROmorphone (DILAUDID) 4 MG tablet Take 0.5-1 tablets (2-4 mg total) by mouth every 4 (four) hours as needed for pain.  40 tablet  0  . insulin aspart (NOVOLOG) 100 UNIT/ML injection Inject 10 Units into the skin 3 (three) times daily with meals.      . insulin glargine (LANTUS) 100 UNIT/ML injection Inject 20 Units into the skin at bedtime.      Marland Kitchen lisinopril (PRINIVIL,ZESTRIL) 20 MG tablet Take 1 tablet (20 mg total) by mouth daily.  30 tablet  0  . polyethylene glycol powder (GLYCOLAX/MIRALAX) powder Take 17 g by mouth daily. Mix 17 gram powder any with 8 oz liquid  527 g  0  . potassium chloride (K-DUR,KLOR-CON) 10 MEQ tablet Take 10 mEq by mouth daily.      . simvastatin (ZOCOR) 10 MG tablet Take 10 mg by mouth at bedtime.      . hyaluronate sodium (RADIAPLEXRX) GEL Apply 1 application topically 2 (two) times daily.      Marland Kitchen ibuprofen (ADVIL) 200 MG tablet Take 600 mg by mouth every 6 (six) hours as needed for pain.      Marland Kitchen MOVIPREP 100 G SOLR Take 1 kit (200 g total) by mouth once.  1 kit  0  . ondansetron (ZOFRAN-ODT) 4 MG disintegrating tablet Take 4 mg by mouth every 8 (eight) hours as needed for nausea.       No current facility-administered medications for this encounter.     ALLERGIES:  Betadine   LABORATORY DATA:  Lab Results  Component Value Date   WBC 7.5 07/15/2013   HGB 12.4 07/15/2013   HCT 37.8 07/15/2013   MCV 86.7 07/15/2013   PLT 199 07/15/2013   Lab Results  Component Value Date   NA 142 07/15/2013   K 3.8 07/15/2013   CL 104 06/06/2013   CO2 23 07/15/2013   Lab Results  Component Value Date   ALT 101* 07/15/2013   AST 40* 07/15/2013   ALKPHOS 362* 07/15/2013   BILITOT 0.46 07/15/2013     NARRATIVE: [name] [name] was seen today for weekly treatment management. The chart was checked and the patient's films were reviewed. The patient states that she does not feel well overall. She is complaining of some fatigue. She is having some nausea and requests a refill for Zofran which has been given.  PHYSICAL EXAMINATION: height is 5\' 3"  (1.6 m) and weight is 151 lb 9.6 oz (68.765 kg). Her temperature is 98.6 F (37 C). Her blood pressure is 146/87 and her pulse is 64.        ASSESSMENT: The patient is doing satisfactorily with treatment.  PLAN: We will continue with the patient's radiation treatment  as planned.

## 2013-07-23 NOTE — Progress Notes (Addendum)
Arma Heading here with her daughter for weekly under treat visit.  She has had 3 fractiosn to her pancreas.  She is having pain in her lower abdomen that she is rating at a 6/10.  She is also having nausea and needs a refill on her zofran.  She also states that everything is smelling bad to her.  Also that she is depressed about having to come for treatment everyday.  She is talking to [employer], Child psychotherapist.  She is fatigued.  She reports that she feels like she has a yeast infection and that her urine is cloudy.

## 2013-07-23 NOTE — Progress Notes (Signed)
  Radiation Oncology         [phone] ________________________________  Name: [name] [name] MRN: [id number]  Date: 06/30/2013  DOB: [birth year]  SIMULATION AND TREATMENT PLANNING NOTE   CONSENT VERIFIED: yes   SET UP: Patient is set-up supine   IMMOBILIZATION: The following immobilization is used: Customized VAC lock bag. This complex treatment device will be used on a daily basis during the patient's treatment.   Diagnosis: Metastatic pancreatic cancer   NARRATIVE: The patient was brought to the CT Simulation planning suite. Identity was confirmed. All relevant records and images related to the planned course of therapy were reviewed. Then, the patient was positioned in a stable reproducible clinical set-up for radiation therapy using a customized Vac-lock bag. Skin markings were placed. The CT images were loaded into the planning software where the target and avoidance structures were contoured.The radiation prescription was entered and confirmed.   The patient will receive 37.5 Gy in 15 fractions.   Daily image guidance is ordered, and this will be used on a daily basis. This is necessary to ensure accurate and precise localization of the target in addition to accurate alignment of the normal tissue structures in this region.   Treatment planning then occurred.   I have requested : Intensity Modulated Radiotherapy (IMRT) is medically necessary for this case for the following reason: Dose homogeneity; the target is in close proximity to critical normal structures, including the kidneys and the liver. IMRT is thus medically to appropriately treat the patient.   Special treatment procedure  The patient will receive chemotherapy during the course of radiation treatment. The patient may experience increased or overlapping toxicity due to this combined-modality approach and the patient will be monitored for such problems. This may include extra lab work as necessary. This therefore  constitutes a special treatment procedure.    ________________________________  Radene Gunning, MD, PhD

## 2013-07-23 NOTE — Addendum Note (Signed)
Encounter addended by: Jonna Coup, MD on: 07/23/2013  5:39 PM<BR>     Documentation filed: Visit Diagnoses, Notes Section

## 2013-07-23 NOTE — Telephone Encounter (Signed)
Called in zofran ODT 4 mg PO q 8 hours PRN nausea, #30, 1 refill, to [facility] at [facility] for Arma Heading per Dr. Mitzi Hansen.

## 2013-07-26 ENCOUNTER — Ambulatory Visit: Payer: Medicaid Other

## 2013-07-26 ENCOUNTER — Ambulatory Visit
Admission: RE | Admit: 2013-07-26 | Discharge: 2013-07-26 | Disposition: A | Discharge status: Home / Self Care | Payer: Medicaid Other | Source: Ambulatory Visit | Attending: Radiation Oncology | Admitting: Radiation Oncology

## 2013-07-27 ENCOUNTER — Ambulatory Visit: Payer: Medicaid Other

## 2013-07-27 ENCOUNTER — Ambulatory Visit
Admission: RE | Admit: 2013-07-27 | Discharge: 2013-07-27 | Disposition: A | Discharge status: Home / Self Care | Payer: Medicaid Other | Source: Ambulatory Visit | Attending: Radiation Oncology | Admitting: Radiation Oncology

## 2013-07-28 ENCOUNTER — Ambulatory Visit
Admission: RE | Admit: 2013-07-28 | Discharge: 2013-07-28 | Disposition: A | Discharge status: Home / Self Care | Payer: Medicaid Other | Source: Ambulatory Visit | Attending: Radiation Oncology | Admitting: Radiation Oncology

## 2013-07-28 ENCOUNTER — Ambulatory Visit: Payer: Medicaid Other

## 2013-07-29 ENCOUNTER — Ambulatory Visit: Payer: Medicaid Other

## 2013-07-29 ENCOUNTER — Telehealth: Payer: Self-pay | Admitting: *Deleted

## 2013-07-29 ENCOUNTER — Ambulatory Visit
Admission: RE | Admit: 2013-07-29 | Discharge: 2013-07-29 | Disposition: A | Discharge status: Home / Self Care | Payer: Medicaid Other | Source: Ambulatory Visit | Attending: Radiation Oncology | Admitting: Radiation Oncology

## 2013-07-29 NOTE — Telephone Encounter (Signed)
Spoke with patient's daughter, [name] [name], to check in and see how things are going with her mother, [name] [name] (unable to reach pt by phone).  [name] [name] stated her mother has continued to take the Xeloda, as prescribed,  on days of radiation, start date of 07/20/13.  She stated her mother has good and bad days, but has tolerated treatment fairly well.  She stated transportation has been fine and that they have continued to talk with Leotis Shames, in SW, regarding social needs.  This RN reminded patient to contact this RN if any barriers to care arise.  Will continue to follow as needed.  She appreciated the call.

## 2013-07-30 ENCOUNTER — Ambulatory Visit
Admission: RE | Admit: 2013-07-30 | Discharge: 2013-07-30 | Disposition: A | Discharge status: Home / Self Care | Payer: Medicaid Other | Source: Ambulatory Visit | Attending: Radiation Oncology | Admitting: Radiation Oncology

## 2013-07-30 ENCOUNTER — Ambulatory Visit: Payer: Medicaid Other

## 2013-07-30 ENCOUNTER — Encounter: Payer: Self-pay | Admitting: Radiation Oncology

## 2013-07-30 VITALS — BP 144/65 | HR 67 | Temp 99.2°F | Ht 63.0 in | Wt 151.1 lb

## 2013-07-30 DIAGNOSIS — C250 Malignant neoplasm of head of pancreas: Secondary | ICD-10-CM

## 2013-07-30 NOTE — Progress Notes (Signed)
Department of Radiation Oncology  Phone:  [phone] Fax:        [phone]  Weekly Treatment Note    Name: [name] [name] Date: 07/30/2013 MRN: [id number] DOB: 1956-05-07   Current dose: 22.5 Gy  Current fraction: 9   MEDICATIONS: Current Outpatient Prescriptions  Medication Sig Dispense Refill  . amLODipine (NORVASC) 10 MG tablet Take 1 tablet (10 mg total) by mouth daily.  30 tablet  0  . capecitabine (XELODA) 500 MG tablet Take 3 tablets (1,500 mg total) by mouth 2 (two) times daily after a meal. On days of radiation only (Monday-Friday) 3000 mg total daily dose  168 tablet  0  . gabapentin (NEURONTIN) 300 MG capsule Take 300 mg by mouth 3 (three) times daily.      . hyaluronate sodium (RADIAPLEXRX) GEL Apply 1 application topically 2 (two) times daily.      Marland Kitchen HYDROmorphone (DILAUDID) 4 MG tablet Take 0.5-1 tablets (2-4 mg total) by mouth every 4 (four) hours as needed for pain.  40 tablet  0  . ibuprofen (ADVIL) 200 MG tablet Take 600 mg by mouth every 6 (six) hours as needed for pain.      Marland Kitchen insulin aspart (NOVOLOG) 100 UNIT/ML injection Inject 10 Units into the skin 3 (three) times daily with meals.      . insulin glargine (LANTUS) 100 UNIT/ML injection Inject 20 Units into the skin at bedtime.      Marland Kitchen lisinopril (PRINIVIL,ZESTRIL) 20 MG tablet Take 1 tablet (20 mg total) by mouth daily.  30 tablet  0  . MOVIPREP 100 G SOLR Take 1 kit (200 g total) by mouth once.  1 kit  0  . ondansetron (ZOFRAN-ODT) 4 MG disintegrating tablet Take 4 mg by mouth every 8 (eight) hours as needed for nausea.      . polyethylene glycol powder (GLYCOLAX/MIRALAX) powder Take 17 g by mouth daily. Mix 17 gram powder any with 8 oz liquid  527 g  0  . potassium chloride (K-DUR,KLOR-CON) 10 MEQ tablet Take 10 mEq by mouth daily.      . simvastatin (ZOCOR) 10 MG tablet Take 10 mg by mouth at bedtime.       No current facility-administered medications for this encounter.     ALLERGIES:  Betadine   LABORATORY DATA:  Lab Results  Component Value Date   WBC 7.5 07/15/2013   HGB 12.4 07/15/2013   HCT 37.8 07/15/2013   MCV 86.7 07/15/2013   PLT 199 07/15/2013   Lab Results  Component Value Date   NA 142 07/15/2013   K 3.8 07/15/2013   CL 104 06/06/2013   CO2 23 07/15/2013   Lab Results  Component Value Date   ALT 101* 07/15/2013   AST 40* 07/15/2013   ALKPHOS 362* 07/15/2013   BILITOT 0.46 07/15/2013     NARRATIVE: [name] [name] was seen today for weekly treatment management. The chart was checked and the patient's films were reviewed. The patient is doing relatively well overall. Some continued pain without any major change in this. She does experience quite a bit of bloating. We have discussed using Gas-X medicine over the weekend which was recommended. No nausea currently.  PHYSICAL EXAMINATION: height is 5\' 3"  (1.6 m) and weight is 151 lb 1.6 oz (68.539 kg). Her temperature is 99.2 F (37.3 C). Her blood pressure is 144/65 and her pulse is 67.        ASSESSMENT: The patient is doing satisfactorily with treatment.  PLAN:  We will continue with the patient's radiation treatment as planned.

## 2013-07-30 NOTE — Progress Notes (Signed)
[name] [name] has received 9 fractions to her pancreas.  She c/o intermittent pain in her right lateral abdominal. Region and sometimes in the right lower rib region.  Presently she has an aching sensation in the right mid back region.   She denies any nausea presently and is c/ofrequently gas and bloating and states this affects her ability to eat.  She also c/o indigestion and frequent burping.

## 2013-08-02 ENCOUNTER — Ambulatory Visit
Admission: RE | Admit: 2013-08-02 | Discharge: 2013-08-02 | Disposition: A | Discharge status: Home / Self Care | Payer: Medicaid Other | Source: Ambulatory Visit | Attending: Radiation Oncology | Admitting: Radiation Oncology

## 2013-08-02 ENCOUNTER — Ambulatory Visit: Payer: Medicaid Other

## 2013-08-03 ENCOUNTER — Ambulatory Visit (HOSPITAL_BASED_OUTPATIENT_CLINIC_OR_DEPARTMENT_OTHER): Payer: Medicaid Other | Admitting: Oncology

## 2013-08-03 ENCOUNTER — Ambulatory Visit: Payer: Medicaid Other

## 2013-08-03 ENCOUNTER — Other Ambulatory Visit (HOSPITAL_BASED_OUTPATIENT_CLINIC_OR_DEPARTMENT_OTHER): Payer: Medicaid Other | Admitting: Lab

## 2013-08-03 ENCOUNTER — Ambulatory Visit
Admission: RE | Admit: 2013-08-03 | Discharge: 2013-08-03 | Disposition: A | Discharge status: Home / Self Care | Payer: Medicaid Other | Source: Ambulatory Visit | Attending: Radiation Oncology | Admitting: Radiation Oncology

## 2013-08-03 VITALS — BP 156/76 | HR 70 | Temp 98.5°F | Resp 20 | Ht 63.0 in | Wt 150.1 lb

## 2013-08-03 DIAGNOSIS — C259 Malignant neoplasm of pancreas, unspecified: Secondary | ICD-10-CM

## 2013-08-03 LAB — CBC WITH DIFFERENTIAL/PLATELET
Basophils Absolute: 0 10*3/uL (ref 0.0–0.1)
Eosinophils Absolute: 0.1 10*3/uL (ref 0.0–0.5)
HCT: 34.9 % (ref 34.8–46.6)
LYMPH%: 11.7 % — ABNORMAL LOW (ref 14.0–49.7)
MCV: 86.7 fL (ref 79.5–101.0)
MONO#: 0.3 10*3/uL (ref 0.1–0.9)
MONO%: 8.6 % (ref 0.0–14.0)
NEUT#: 2.9 10*3/uL (ref 1.5–6.5)
NEUT%: 75.6 % (ref 38.4–76.8)
Platelets: 144 10*3/uL — ABNORMAL LOW (ref 145–400)
WBC: 3.8 10*3/uL — ABNORMAL LOW (ref 3.9–10.3)

## 2013-08-03 LAB — COMPREHENSIVE METABOLIC PANEL (CC13)
Alkaline Phosphatase: 172 U/L — ABNORMAL HIGH (ref 40–150)
BUN: 11.3 mg/dL (ref 7.0–26.0)
CO2: 27 mEq/L (ref 22–29)
Creatinine: 0.8 mg/dL (ref 0.6–1.1)
Glucose: 220 mg/dl — ABNORMAL HIGH (ref 70–140)
Total Bilirubin: 0.56 mg/dL (ref 0.20–1.20)
Total Protein: 7.6 g/dL (ref 6.4–8.3)

## 2013-08-03 MED ORDER — TRAMADOL HCL 50 MG PO TABS: 50.0000 mg | ORAL_TABLET | Freq: Four times a day (QID) | ORAL | Status: DC | PRN

## 2013-08-03 NOTE — Progress Notes (Signed)
   [facility]    OFFICE PROGRESS NOTE   INTERVAL HISTORY:   She returns as scheduled. She began radiation and Xeloda on 07/20/2013. No diarrhea. She takes hydromorphone for pain and does not like the way this makes her feel. She requests a different pain medication. She has soreness at the left lower gumline. No discrete ulcer. She continues to have pain in the abdomen and back.  Objective:  Vital signs in last 24 hours:  Blood pressure 156/76, pulse 70, temperature 98.5 F (36.9 C), temperature source Oral, resp. rate 20, height 5\' 3"  (1.6 m), weight 150 lb 1.6 oz (68.085 kg).    HEENT: No thrush or ulcers Resp: Lungs clear bilaterally Cardio: Regular rate and rhythm GI: No hepatomegaly, no mass, mild tenderness in the right upper quadrant Vascular: No leg edema   Lab Results:  Lab Results  Component Value Date   WBC 3.8* 08/03/2013   HGB 11.7 08/03/2013   HCT 34.9 08/03/2013   MCV 86.7 08/03/2013   PLT 144* 08/03/2013   ANC 2.9 Potassium 3.3, creatinine 0.8   Medications: I have reviewed the patient's current medications.  Assessment/Plan: 1. 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 2. Obstructive jaundice secondary to #1. Status post placement of a bile duct stent on 05/24/2013. 3. Pain secondary to pancreas cancer. Controlled with hydromorphone. 4. Diabetes. 5. History of chronic pancreatitis. 6. History of alcohol use. 7. History of tobacco use. 8. Anxiety/depression. 9. Cutaneous nodule at the left flank. Question cyst, question metastasis. 10. Abnormal EKG, question Brugada syndrome.  Disposition:  She appears to be tolerating the Xeloda and radiation well. She is scheduled to complete radiation on 08/09/2013. She will return for an office visit on 08/17/2013. We encouraged her to resume  the potassium supplement.  Ms. Darcey will try tramadol for pain.   Thornton Papas, MD  08/03/2013  5:09 PM

## 2013-08-03 NOTE — Progress Notes (Signed)
Met with patient and daughter to assess for needs.  She continues to use and have success with medicaid transportation services.  She is requesting more Ensure supplements and a request was sent to dietician.  She denies other needs or barriers to care at present time.  Will continue to follow.

## 2013-08-04 ENCOUNTER — Ambulatory Visit
Admission: RE | Admit: 2013-08-04 | Discharge: 2013-08-04 | Disposition: A | Discharge status: Home / Self Care | Payer: Medicaid Other | Source: Ambulatory Visit | Attending: Radiation Oncology | Admitting: Radiation Oncology

## 2013-08-04 ENCOUNTER — Ambulatory Visit: Payer: Medicaid Other

## 2013-08-04 ENCOUNTER — Telehealth: Payer: Self-pay | Admitting: *Deleted

## 2013-08-04 NOTE — Telephone Encounter (Signed)
i mail the pt a letter/avs making her aware of her appts for 08/17/13 with labs @ 2:45pm and ov @ 3:15pm...td

## 2013-08-05 ENCOUNTER — Ambulatory Visit: Payer: Medicaid Other

## 2013-08-05 ENCOUNTER — Telehealth: Payer: Self-pay | Admitting: Nutrition

## 2013-08-05 ENCOUNTER — Ambulatory Visit
Admission: RE | Admit: 2013-08-05 | Discharge: 2013-08-05 | Disposition: A | Discharge status: Home / Self Care | Payer: Medicaid Other | Source: Ambulatory Visit | Attending: Radiation Oncology | Admitting: Radiation Oncology

## 2013-08-05 NOTE — Telephone Encounter (Signed)
I attempted to call patient to offer additional oral nutrition supplements.  I was unable to reach patient and was unable to leave her a message that I have called.  I will call radiation oncology to leave a message for patient to contact me if she requests additional supplements.

## 2013-08-06 ENCOUNTER — Ambulatory Visit
Admission: RE | Admit: 2013-08-06 | Discharge: 2013-08-06 | Disposition: A | Discharge status: Home / Self Care | Payer: Medicaid Other | Source: Ambulatory Visit | Attending: Radiation Oncology | Admitting: Radiation Oncology

## 2013-08-06 ENCOUNTER — Ambulatory Visit: Payer: Medicaid Other | Admitting: Radiation Oncology

## 2013-08-06 ENCOUNTER — Ambulatory Visit: Payer: Medicaid Other

## 2013-08-09 ENCOUNTER — Ambulatory Visit
Admission: RE | Admit: 2013-08-09 | Discharge: 2013-08-09 | Disposition: A | Discharge status: Home / Self Care | Payer: Medicaid Other | Source: Ambulatory Visit | Attending: Radiation Oncology | Admitting: Radiation Oncology

## 2013-08-09 ENCOUNTER — Encounter: Payer: Self-pay | Admitting: Radiation Oncology

## 2013-08-09 ENCOUNTER — Ambulatory Visit: Payer: Medicaid Other

## 2013-08-09 ENCOUNTER — Ambulatory Visit
Admission: RE | Admit: 2013-08-09 | Discharge: 2013-08-09 | Disposition: A | Discharge status: Home / Self Care | Payer: Medicaid Other | Source: Ambulatory Visit | Admitting: Radiation Oncology

## 2013-08-09 VITALS — BP 156/77 | HR 63 | Temp 98.2°F | Resp 20 | Wt 149.8 lb

## 2013-08-09 DIAGNOSIS — C250 Malignant neoplasm of head of pancreas: Secondary | ICD-10-CM

## 2013-08-09 NOTE — Progress Notes (Signed)
Weekly  Rad txs pancreatic ca, 15/15, took her last Xeloda this am, none for tonight, c/o pain off and on, nausea, had diarrhea over the weekend, resolved, c/o pain watery,itchy eyes, says a lot of  Thick, saliva, gags and gets nauseated, drinks ensure, fatigued

## 2013-08-09 NOTE — Progress Notes (Signed)
Department of Radiation Oncology  Phone:  [phone] Fax:        [phone]  Weekly Treatment Note    Name: [name] [name] Date: 08/09/2013 MRN: [id number] DOB: 11-Jul-1956   Current dose: 37.5 Gy  Current fraction: 15   MEDICATIONS: Current Outpatient Prescriptions  Medication Sig Dispense Refill  . amLODipine (NORVASC) 10 MG tablet Take 1 tablet (10 mg total) by mouth daily.  30 tablet  0  . capecitabine (XELODA) 500 MG tablet Take 3 tablets (1,500 mg total) by mouth 2 (two) times daily after a meal. On days of radiation only (Monday-Friday) 3000 mg total daily dose  168 tablet  0  . gabapentin (NEURONTIN) 300 MG capsule Take 300 mg by mouth 3 (three) times daily.      . hyaluronate sodium (RADIAPLEXRX) GEL Apply 1 application topically 2 (two) times daily.      Marland Kitchen HYDROmorphone (DILAUDID) 4 MG tablet Take 0.5-1 tablets (2-4 mg total) by mouth every 4 (four) hours as needed for pain.  40 tablet  0  . ibuprofen (ADVIL) 200 MG tablet Take 600 mg by mouth every 6 (six) hours as needed for pain.      Marland Kitchen insulin aspart (NOVOLOG) 100 UNIT/ML injection Inject 10 Units into the skin 3 (three) times daily with meals.      . insulin glargine (LANTUS) 100 UNIT/ML injection Inject 20 Units into the skin at bedtime.      Marland Kitchen lisinopril (PRINIVIL,ZESTRIL) 20 MG tablet Take 1 tablet (20 mg total) by mouth daily.  30 tablet  0  . ondansetron (ZOFRAN-ODT) 4 MG disintegrating tablet Take 4 mg by mouth every 8 (eight) hours as needed for nausea.      . polyethylene glycol powder (GLYCOLAX/MIRALAX) powder Take 17 g by mouth daily. Mix 17 gram powder any with 8 oz liquid  527 g  0  . potassium chloride (K-DUR,KLOR-CON) 10 MEQ tablet Take 10 mEq by mouth daily.      . simethicone (MYLICON) 125 MG chewable tablet Chew 125 mg by mouth every 6 (six) hours as needed for flatulence.      . simvastatin (ZOCOR) 10 MG tablet Take 10 mg by mouth at bedtime.      . traMADol (ULTRAM) 50 MG tablet Take 1 tablet (50  mg total) by mouth every 6 (six) hours as needed for pain.  50 tablet  0   No current facility-administered medications for this encounter.     ALLERGIES: Betadine   LABORATORY DATA:  Lab Results  Component Value Date   WBC 3.8* 08/03/2013   HGB 11.7 08/03/2013   HCT 34.9 08/03/2013   MCV 86.7 08/03/2013   PLT 144* 08/03/2013   Lab Results  Component Value Date   NA 137 08/03/2013   K 3.3* 08/03/2013   CL 104 06/06/2013   CO2 27 08/03/2013   Lab Results  Component Value Date   ALT 35 08/03/2013   AST 16 08/03/2013   ALKPHOS 172* 08/03/2013   BILITOT 0.56 08/03/2013     NARRATIVE: [name] [name] was seen today for weekly treatment management. The chart was checked and the patient's films were reviewed. The patient finished her final fraction today. She felt worse over the weekend with some nausea and diarrhea. This has resolved. The patient is having some cramping abdominal pain and has had some belching. We discussed using gas-x medication but she does not like this.  PHYSICAL EXAMINATION: weight is 149 lb 12.8 oz (67.949 kg). Her oral  temperature is 98.2 F (36.8 C). Her blood pressure is 156/77 and her pulse is 63. Her respiration is 20.        ASSESSMENT: The patient did satisfactorily with treatment. She finished her final fraction today. I am hopeful that she will begin to feel better now that she has completed her radiation. She is also finished with Xeloda.  PLAN: Followup in one month.

## 2013-08-10 ENCOUNTER — Ambulatory Visit: Payer: Medicaid Other

## 2013-08-11 ENCOUNTER — Ambulatory Visit: Payer: Medicaid Other

## 2013-08-11 ENCOUNTER — Telehealth: Payer: Self-pay | Admitting: *Deleted

## 2013-08-11 NOTE — Telephone Encounter (Signed)
Patient daughter called, statting patient has been nauseated today, pain in abdomen, took zofran and wears off after , hard to take dilaudid and keep down , no gagging or thick saliva no fever or diarrhea, patient completed rad tx 08/09/13, asked what she should do, if pain increases and cannot keep any liquids down or food, should go to the ED, will notify MD, encouraged to drink ginger ale, clear liquids, take zofran  As directed, 4:10 PM

## 2013-08-12 ENCOUNTER — Ambulatory Visit: Payer: Medicaid Other

## 2013-08-12 ENCOUNTER — Inpatient Hospital Stay (HOSPITAL_COMMUNITY)
Admission: EM | Admit: 2013-08-12 | Discharge: 2013-08-15 | DRG: 391 | Disposition: A | Discharge status: Home / Self Care | Payer: Medicaid Other | Attending: Internal Medicine | Admitting: Internal Medicine

## 2013-08-12 ENCOUNTER — Encounter (HOSPITAL_COMMUNITY): Payer: Self-pay | Admitting: *Deleted

## 2013-08-12 ENCOUNTER — Emergency Department (HOSPITAL_COMMUNITY): Payer: Medicaid Other

## 2013-08-12 DIAGNOSIS — R110 Nausea: Secondary | ICD-10-CM | POA: Diagnosis present

## 2013-08-12 DIAGNOSIS — K55069 Acute infarction of intestine, part and extent unspecified: Secondary | ICD-10-CM

## 2013-08-12 DIAGNOSIS — K831 Obstruction of bile duct: Secondary | ICD-10-CM | POA: Diagnosis present

## 2013-08-12 DIAGNOSIS — F329 Major depressive disorder, single episode, unspecified: Secondary | ICD-10-CM | POA: Diagnosis present

## 2013-08-12 DIAGNOSIS — E119 Type 2 diabetes mellitus without complications: Secondary | ICD-10-CM

## 2013-08-12 DIAGNOSIS — E876 Hypokalemia: Secondary | ICD-10-CM

## 2013-08-12 DIAGNOSIS — R1110 Vomiting, unspecified: Secondary | ICD-10-CM

## 2013-08-12 DIAGNOSIS — F3289 Other specified depressive episodes: Secondary | ICD-10-CM | POA: Diagnosis present

## 2013-08-12 DIAGNOSIS — K279 Peptic ulcer, site unspecified, unspecified as acute or chronic, without hemorrhage or perforation: Secondary | ICD-10-CM | POA: Diagnosis present

## 2013-08-12 DIAGNOSIS — R109 Unspecified abdominal pain: Principal | ICD-10-CM

## 2013-08-12 DIAGNOSIS — F1011 Alcohol abuse, in remission: Secondary | ICD-10-CM | POA: Diagnosis present

## 2013-08-12 DIAGNOSIS — C250 Malignant neoplasm of head of pancreas: Secondary | ICD-10-CM

## 2013-08-12 DIAGNOSIS — Z79899 Other long term (current) drug therapy: Secondary | ICD-10-CM

## 2013-08-12 DIAGNOSIS — Z87891 Personal history of nicotine dependence: Secondary | ICD-10-CM

## 2013-08-12 DIAGNOSIS — K55059 Acute (reversible) ischemia of intestine, part and extent unspecified: Secondary | ICD-10-CM | POA: Diagnosis present

## 2013-08-12 DIAGNOSIS — I10 Essential (primary) hypertension: Secondary | ICD-10-CM

## 2013-08-12 DIAGNOSIS — K219 Gastro-esophageal reflux disease without esophagitis: Secondary | ICD-10-CM | POA: Diagnosis present

## 2013-08-12 DIAGNOSIS — E1140 Type 2 diabetes mellitus with diabetic neuropathy, unspecified: Secondary | ICD-10-CM | POA: Diagnosis present

## 2013-08-12 DIAGNOSIS — R933 Abnormal findings on diagnostic imaging of other parts of digestive tract: Secondary | ICD-10-CM

## 2013-08-12 DIAGNOSIS — E785 Hyperlipidemia, unspecified: Secondary | ICD-10-CM

## 2013-08-12 DIAGNOSIS — F411 Generalized anxiety disorder: Secondary | ICD-10-CM | POA: Diagnosis present

## 2013-08-12 DIAGNOSIS — K838 Other specified diseases of biliary tract: Secondary | ICD-10-CM | POA: Diagnosis present

## 2013-08-12 DIAGNOSIS — K2960 Other gastritis without bleeding: Secondary | ICD-10-CM | POA: Diagnosis present

## 2013-08-12 DIAGNOSIS — T66XXXA Radiation sickness, unspecified, initial encounter: Secondary | ICD-10-CM | POA: Diagnosis present

## 2013-08-12 DIAGNOSIS — E1142 Type 2 diabetes mellitus with diabetic polyneuropathy: Secondary | ICD-10-CM | POA: Diagnosis present

## 2013-08-12 DIAGNOSIS — Z794 Long term (current) use of insulin: Secondary | ICD-10-CM

## 2013-08-12 DIAGNOSIS — Z23 Encounter for immunization: Secondary | ICD-10-CM

## 2013-08-12 DIAGNOSIS — R112 Nausea with vomiting, unspecified: Secondary | ICD-10-CM

## 2013-08-12 DIAGNOSIS — E1149 Type 2 diabetes mellitus with other diabetic neurological complication: Secondary | ICD-10-CM | POA: Diagnosis present

## 2013-08-12 HISTORY — DX: Unspecified hearing loss, unspecified ear: H91.90

## 2013-08-12 HISTORY — DX: Type 2 diabetes mellitus without complications: E11.9

## 2013-08-12 HISTORY — DX: Reserved for inherently not codable concepts without codable children: IMO0001

## 2013-08-12 LAB — COMPREHENSIVE METABOLIC PANEL
ALT: 12 U/L (ref 0–35)
Alkaline Phosphatase: 135 U/L — ABNORMAL HIGH (ref 39–117)
CO2: 28 mEq/L (ref 19–32)
Chloride: 100 mEq/L (ref 96–112)
GFR calc Af Amer: >90 mL/min (ref >90)
Glucose, Bld: 176 mg/dL — ABNORMAL HIGH (ref 70–99)
Potassium: 3.2 mEq/L — ABNORMAL LOW (ref 3.5–5.1)
Sodium: 136 mEq/L (ref 135–145)
Total Bilirubin: 0.4 mg/dL (ref 0.3–1.2)
Total Protein: 8.1 g/dL (ref 6.0–8.3)

## 2013-08-12 LAB — CBC WITH DIFFERENTIAL/PLATELET
Basophils Absolute: 0 10*3/uL (ref 0.0–0.1)
Basophils Relative: 0 % (ref 0–1)
Eosinophils Absolute: 0.2 10*3/uL (ref 0.0–0.7)
HCT: 36.1 % (ref 36.0–46.0)
Hemoglobin: 12.5 g/dL (ref 12.0–15.0)
MCH: 29.8 pg (ref 26.0–34.0)
MCHC: 34.6 g/dL (ref 30.0–36.0)
Monocytes Absolute: 0.4 10*3/uL (ref 0.1–1.0)
Monocytes Relative: 9 % (ref 3–12)
Neutrophils Relative %: 74 % (ref 43–77)
RDW: 15.2 % (ref 11.5–15.5)

## 2013-08-12 LAB — URINALYSIS, ROUTINE W REFLEX MICROSCOPIC
Bilirubin Urine: NEGATIVE
Hgb urine dipstick: NEGATIVE
Protein, ur: 30 mg/dL — AB
Urobilinogen, UA: 1 mg/dL (ref 0.0–1.0)

## 2013-08-12 LAB — GLUCOSE, CAPILLARY: Glucose-Capillary: 157 mg/dL — ABNORMAL HIGH (ref 70–99)

## 2013-08-12 MED ORDER — HYDROMORPHONE HCL PF 1 MG/ML IJ SOLN
0.5000 mg | Freq: Once | INTRAMUSCULAR | Status: AC
  Administered 2013-08-12: 0.5 mg via INTRAVENOUS
  Filled 2013-08-12: qty 1

## 2013-08-12 MED ORDER — ACETAMINOPHEN 325 MG PO TABS: 650.0000 mg | ORAL_TABLET | Freq: Four times a day (QID) | ORAL | Status: DC | PRN

## 2013-08-12 MED ORDER — HYDROMORPHONE HCL PF 1 MG/ML IJ SOLN
1.0000 mg | INTRAMUSCULAR | Status: DC | PRN
  Administered 2013-08-12 – 2013-08-13 (×3): 1 mg via INTRAVENOUS
  Filled 2013-08-12 (×3): qty 1

## 2013-08-12 MED ORDER — POTASSIUM CHLORIDE CRYS ER 10 MEQ PO TBCR
10.0000 meq | EXTENDED_RELEASE_TABLET | Freq: Every day | ORAL | Status: DC
  Administered 2013-08-13 – 2013-08-15 (×3): 10 meq via ORAL
  Filled 2013-08-12 (×3): qty 1

## 2013-08-12 MED ORDER — SODIUM CHLORIDE 0.9 % IV SOLN
INTRAVENOUS | Status: DC
  Administered 2013-08-13 – 2013-08-14 (×3): via INTRAVENOUS

## 2013-08-12 MED ORDER — INSULIN GLARGINE 100 UNIT/ML ~~LOC~~ SOLN
10.0000 [IU] | Freq: Every day | SUBCUTANEOUS | Status: DC
  Administered 2013-08-13: 10 [IU] via SUBCUTANEOUS
  Filled 2013-08-12 (×4): qty 0.1

## 2013-08-12 MED ORDER — SIMETHICONE 80 MG PO CHEW
80.0000 mg | CHEWABLE_TABLET | Freq: Four times a day (QID) | ORAL | Status: DC | PRN
  Administered 2013-08-14: 80 mg via ORAL
  Filled 2013-08-12 (×2): qty 1

## 2013-08-12 MED ORDER — LISINOPRIL 20 MG PO TABS
20.0000 mg | ORAL_TABLET | Freq: Every day | ORAL | Status: DC
  Administered 2013-08-13 – 2013-08-15 (×3): 20 mg via ORAL
  Filled 2013-08-12 (×4): qty 1

## 2013-08-12 MED ORDER — IBUPROFEN 600 MG PO TABS
600.0000 mg | ORAL_TABLET | Freq: Four times a day (QID) | ORAL | Status: DC | PRN
  Filled 2013-08-12: qty 1

## 2013-08-12 MED ORDER — IOHEXOL 300 MG/ML  SOLN
100.0000 mL | Freq: Once | INTRAMUSCULAR | Status: AC | PRN
  Administered 2013-08-12: 100 mL via INTRAVENOUS

## 2013-08-12 MED ORDER — POLYETHYLENE GLYCOL 3350 17 G PO PACK
17.0000 g | PACK | Freq: Every day | ORAL | Status: DC | PRN
  Filled 2013-08-12: qty 1

## 2013-08-12 MED ORDER — AMLODIPINE BESYLATE 10 MG PO TABS
10.0000 mg | ORAL_TABLET | Freq: Every day | ORAL | Status: DC
  Administered 2013-08-13 – 2013-08-15 (×3): 10 mg via ORAL
  Filled 2013-08-12 (×4): qty 1

## 2013-08-12 MED ORDER — METOCLOPRAMIDE HCL 5 MG/ML IJ SOLN
10.0000 mg | Freq: Once | INTRAMUSCULAR | Status: AC
  Administered 2013-08-12: 10 mg via INTRAVENOUS
  Filled 2013-08-12: qty 2

## 2013-08-12 MED ORDER — ONDANSETRON HCL 4 MG/2ML IJ SOLN
4.0000 mg | Freq: Once | INTRAMUSCULAR | Status: AC
  Administered 2013-08-12: 4 mg via INTRAVENOUS
  Filled 2013-08-12: qty 2

## 2013-08-12 MED ORDER — SIMVASTATIN 10 MG PO TABS
10.0000 mg | ORAL_TABLET | Freq: Every day | ORAL | Status: DC
  Administered 2013-08-13 – 2013-08-14 (×2): 10 mg via ORAL
  Filled 2013-08-12 (×4): qty 1

## 2013-08-12 MED ORDER — SODIUM CHLORIDE 0.9 % IJ SOLN
3.0000 mL | Freq: Two times a day (BID) | INTRAMUSCULAR | Status: DC
  Administered 2013-08-13 – 2013-08-15 (×3): 3 mL via INTRAVENOUS

## 2013-08-12 MED ORDER — RADIAPLEXRX EX GEL: 1.0000 "application " | Freq: Two times a day (BID) | CUTANEOUS | Status: DC

## 2013-08-12 MED ORDER — SODIUM CHLORIDE 0.9 % IV SOLN
INTRAVENOUS | Status: AC
  Administered 2013-08-12: via INTRAVENOUS

## 2013-08-12 MED ORDER — ENOXAPARIN SODIUM 80 MG/0.8ML ~~LOC~~ SOLN
70.0000 mg | Freq: Two times a day (BID) | SUBCUTANEOUS | Status: DC
  Administered 2013-08-12 – 2013-08-13 (×3): 70 mg via SUBCUTANEOUS
  Administered 2013-08-14: 10:00:00 via SUBCUTANEOUS
  Administered 2013-08-14 – 2013-08-15 (×2): 70 mg via SUBCUTANEOUS
  Filled 2013-08-12 (×8): qty 0.8

## 2013-08-12 MED ORDER — ONDANSETRON HCL 4 MG/2ML IJ SOLN
4.0000 mg | Freq: Four times a day (QID) | INTRAMUSCULAR | Status: DC | PRN
  Administered 2013-08-12 – 2013-08-13 (×3): 4 mg via INTRAVENOUS
  Filled 2013-08-12 (×3): qty 2

## 2013-08-12 MED ORDER — IOHEXOL 300 MG/ML  SOLN
25.0000 mL | Freq: Once | INTRAMUSCULAR | Status: AC | PRN
  Administered 2013-08-12: 25 mL via ORAL

## 2013-08-12 MED ORDER — ACETAMINOPHEN 650 MG RE SUPP: 650.0000 mg | Freq: Four times a day (QID) | RECTAL | Status: DC | PRN

## 2013-08-12 MED ORDER — GABAPENTIN 300 MG PO CAPS
300.0000 mg | ORAL_CAPSULE | Freq: Three times a day (TID) | ORAL | Status: DC
  Administered 2013-08-13 – 2013-08-15 (×8): 300 mg via ORAL
  Filled 2013-08-12 (×11): qty 1

## 2013-08-12 MED ORDER — SODIUM CHLORIDE 0.9 % IV BOLUS (SEPSIS)
1000.0000 mL | INTRAVENOUS | Status: AC
  Administered 2013-08-12: 1000 mL via INTRAVENOUS

## 2013-08-12 MED ORDER — ONDANSETRON HCL 4 MG PO TABS
4.0000 mg | ORAL_TABLET | Freq: Four times a day (QID) | ORAL | Status: DC | PRN
  Administered 2013-08-15: 4 mg via ORAL
  Filled 2013-08-12 (×2): qty 1

## 2013-08-12 NOTE — ED Notes (Signed)
Pt reports having abd pain and n/v since getting chemo several weeks ago. Denies diarrhea. No acute distress noted at triage.

## 2013-08-12 NOTE — Progress Notes (Signed)
ANTICOAGULATION CONSULT NOTE - Initial Consult  Pharmacy Consult for lovenox Indication: superior mesenteric vein thrombus  Allergies  Allergen Reactions  . Betadine [Povidone Iodine] Itching and Rash    Patient Measurements: weight 68 kg, height 63 inches    Vital Signs: Temp: 98.8 F (37.1 C) (09/25 1532) Temp src: Oral (09/25 1532) BP: 162/75 mmHg (09/25 2100) Pulse Rate: 56 (09/25 2100)  Labs:  Recent Labs  08/12/13 1535  HGB 12.5  HCT 36.1  PLT 150  CREATININE 0.63    The CrCl is unknown because both a height and weight (above a minimum accepted value) are required for this calculation.   Medical History: Past Medical History  Diagnosis Date  . Diabetes mellitus without complication   . Hypertension   . Depression   . Anxiety   . Hyperlipidemia   . Pancreatitis   . GERD (gastroesophageal reflux disease)   . Pancreatic cancer     Medications:  See med rec  Assessment: Patient is a 57 y.o F presented to the ED with c/o emesis and abdominal pain. She has pancreatic CA s/p ERCP with biliary stent. She also completed chemotherapy and radiation this week.  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  To start anticoagulation for thrombus.  Goal of Therapy:  Anti-Xa level= 0.6-1.2 Monitor platelets by anticoagulation protocol: Yes   Plan:  1) lovenox 70mg  SQ q12h 2) cbc q72h   [name] [name] P 08/12/2013,9:22 PM

## 2013-08-12 NOTE — ED Provider Notes (Signed)
CSN: [id number]     Arrival date & time 08/12/13  1527 History   First MD Initiated Contact with Patient 08/12/13 1616     Chief Complaint  Patient presents with  . Emesis  . Abdominal Pain   (Consider location/radiation/quality/duration/timing/severity/associated sxs/prior Treatment) Patient is a 57 y.o. female presenting with vomiting and abdominal pain. The history is provided by the patient.  Emesis Severity:  Mild Duration:  3 weeks Timing:  Constant Quality:  Bilious material and stomach contents Progression:  Unchanged Chronicity:  New Relieved by:  Nothing Worsened by:  Nothing tried Ineffective treatments:  Antiemetics, liquids and ice chips Associated symptoms: abdominal pain   Associated symptoms: no diarrhea and no headaches   Abdominal Pain Associated symptoms: vomiting   Associated symptoms: no chest pain, no cough, no diarrhea, no dysuria, no fatigue, no fever, no hematuria, no nausea and no shortness of breath     Past Medical History  Diagnosis Date  . Diabetes mellitus without complication   . Hypertension   . Depression   . Anxiety   . Hyperlipidemia   . Pancreatitis   . GERD (gastroesophageal reflux disease)   . Pancreatic cancer    Past Surgical History  Procedure Laterality Date  . Appendectomy    . Ercp N/A 05/24/2013    Procedure: ENDOSCOPIC RETROGRADE CHOLANGIOPANCREATOGRAPHY (ERCP);  Surgeon: Rachael Fee, MD;  Location: [facility] OR;  Service: Endoscopy;  Laterality: N/A;  MAC vs. general per anesthesia   . Tubal ligation     Family History  Problem Relation Age of Onset  . Diabetes type II Mother   . Hypertension Mother   . Heart attack Father   . HIV/AIDS Brother    History  Substance Use Topics  . Smoking status: Former Smoker -- 0.50 packs/day for 42 years    Quit date: 05/28/2013  . Smokeless tobacco: Not on file  . Alcohol Use: No   OB History   Grav Para Term Preterm Abortions TAB SAB Ect Mult Living                 Review  of Systems  Constitutional: Negative for fever and fatigue.  HENT: Negative for congestion, drooling and neck pain.   Eyes: Negative for pain.  Respiratory: Negative for cough and shortness of breath.   Cardiovascular: Negative for chest pain.  Gastrointestinal: Positive for vomiting and abdominal pain. Negative for nausea and diarrhea.  Genitourinary: Negative for dysuria and hematuria.  Musculoskeletal: Negative for back pain and gait problem.  Skin: Negative for color change.  Neurological: Negative for dizziness and headaches.  Hematological: Negative for adenopathy.  Psychiatric/Behavioral: Negative for behavioral problems.  All other systems reviewed and are negative.    Allergies  Betadine  Home Medications   Current Outpatient Rx  Name  Route  Sig  Dispense  Refill  . amLODipine (NORVASC) 10 MG tablet   Oral   Take 1 tablet (10 mg total) by mouth daily.   30 tablet   0   . gabapentin (NEURONTIN) 300 MG capsule   Oral   Take 300 mg by mouth 3 (three) times daily.         Marland Kitchen HYDROmorphone (DILAUDID) 4 MG tablet   Oral   Take 0.5-1 tablets (2-4 mg total) by mouth every 4 (four) hours as needed for pain.   40 tablet   0   . ibuprofen (ADVIL) 200 MG tablet   Oral   Take 600 mg by mouth every 6 (  six) hours as needed for pain.         Marland Kitchen insulin aspart (NOVOLOG) 100 UNIT/ML injection   Subcutaneous   Inject 10 Units into the skin 3 (three) times daily with meals.         . insulin glargine (LANTUS) 100 UNIT/ML injection   Subcutaneous   Inject 20 Units into the skin at bedtime.         Marland Kitchen lisinopril (PRINIVIL,ZESTRIL) 20 MG tablet   Oral   Take 1 tablet (20 mg total) by mouth daily.   30 tablet   0   . ondansetron (ZOFRAN-ODT) 4 MG disintegrating tablet   Oral   Take 4 mg by mouth every 8 (eight) hours as needed for nausea.         . polyethylene glycol (MIRALAX / GLYCOLAX) packet   Oral   Take 17 g by mouth daily as needed (constipation).          . potassium chloride (K-DUR,KLOR-CON) 10 MEQ tablet   Oral   Take 10 mEq by mouth daily.         . simethicone (MYLICON) 125 MG chewable tablet   Oral   Chew 125 mg by mouth every 6 (six) hours as needed for flatulence.         . simvastatin (ZOCOR) 10 MG tablet   Oral   Take 10 mg by mouth at bedtime.         . capecitabine (XELODA) 500 MG tablet   Oral   Take 3 tablets (1,500 mg total) by mouth 2 (two) times daily after a meal. On days of radiation only (Monday-Friday) 3000 mg total daily dose   168 tablet   0     Script to [facility] in managed care department DX: 15 ...   . hyaluronate sodium (RADIAPLEXRX) GEL   Topical   Apply 1 application topically 2 (two) times daily.         . polyethylene glycol powder (GLYCOLAX/MIRALAX) powder   Oral   Take 17 g by mouth daily. Mix 17 gram powder any with 8 oz liquid   527 g   0    BP 171/84  Pulse 82  Temp(Src) 98.8 F (37.1 C) (Oral)  Resp 18  SpO2 99% Physical Exam  Nursing note and vitals reviewed. Constitutional: She is oriented to person, place, and time. She appears well-developed and well-nourished.  HENT:  Head: Normocephalic.  Mouth/Throat: No oropharyngeal exudate.  Eyes: Conjunctivae and EOM are normal. Pupils are equal, round, and reactive to light.  Neck: Normal range of motion. Neck supple.  Cardiovascular: Normal rate, regular rhythm, normal heart sounds and intact distal pulses.  Exam reveals no gallop and no friction rub.   No murmur heard. Pulmonary/Chest: Effort normal and breath sounds normal. No respiratory distress. She has no wheezes.  Abdominal: Soft. Bowel sounds are normal. There is tenderness (diffuse non-focal abd pain, possibly slightly worse on right side of abdomen). There is no rebound and no guarding.  Musculoskeletal: Normal range of motion. She exhibits no edema and no tenderness.  Neurological: She is alert and oriented to person, place, and time.  Skin: Skin is warm and  dry.  Psychiatric: She has a normal mood and affect. Her behavior is normal.    ED Course  Procedures (including critical care time) Labs Review Labs Reviewed  COMPREHENSIVE METABOLIC PANEL - Abnormal; Notable for the following:    Potassium 3.2 (*)    Glucose, Bld 176 (*)  Alkaline Phosphatase 135 (*)    All other components within normal limits  LIPASE, BLOOD - Abnormal; Notable for the following:    Lipase 9 (*)    All other components within normal limits  CBC WITH DIFFERENTIAL - Abnormal; Notable for the following:    Lymphs Abs 0.6 (*)    All other components within normal limits  URINALYSIS, ROUTINE W REFLEX MICROSCOPIC - Abnormal; Notable for the following:    Glucose, UA >1000 (*)    Ketones, ur 15 (*)    Protein, ur 30 (*)    All other components within normal limits  COMPREHENSIVE METABOLIC PANEL - Abnormal; Notable for the following:    Potassium 3.3 (*)    Glucose, Bld 164 (*)    Albumin 3.4 (*)    Alkaline Phosphatase 121 (*)    All other components within normal limits  CBC WITH DIFFERENTIAL - Abnormal; Notable for the following:    WBC 3.8 (*)    RBC 3.72 (*)    Hemoglobin 11.0 (*)    HCT 32.1 (*)    Platelets 125 (*)    All other components within normal limits  PROTIME-INR - Abnormal; Notable for the following:    Prothrombin Time 17.2 (*)    All other components within normal limits  CBC - Abnormal; Notable for the following:    WBC 3.6 (*)    Hemoglobin 11.3 (*)    HCT 34.1 (*)    Platelets 128 (*)    All other components within normal limits  COMPREHENSIVE METABOLIC PANEL - Abnormal; Notable for the following:    Potassium 3.3 (*)    Glucose, Bld 158 (*)    Alkaline Phosphatase 121 (*)    All other components within normal limits  PROTIME-INR - Abnormal; Notable for the following:    Prothrombin Time 17.2 (*)    All other components within normal limits  GLUCOSE, CAPILLARY - Abnormal; Notable for the following:    Glucose-Capillary 157 (*)     All other components within normal limits  GLUCOSE, CAPILLARY - Abnormal; Notable for the following:    Glucose-Capillary 179 (*)    All other components within normal limits  URINE MICROSCOPIC-ADD ON  MAGNESIUM  PHOSPHORUS  APTT  TSH  APTT   Imaging Review Ct Abdomen Pelvis W Contrast  08/12/2013   CLINICAL DATA:  Nonfocal abdominal pain with nausea. History of diabetes, hypertension, pancreatitis and pancreatic cancer. Prior appendectomy.  EXAM: CT ABDOMEN AND PELVIS WITH CONTRAST  TECHNIQUE: Multidetector CT imaging of the abdomen and pelvis was performed using the standard protocol following bolus administration of intravenous contrast.  CONTRAST:  OMNIPAQUE IOHEXOL 300 MG/ML  SOLN  COMPARISON:  Abdominal pelvic CT 05/28/2013. PET-CT 06/09/2013.  FINDINGS: There is stable mild left basilar atelectasis. No significant pleural or pericardial effusion is present.  Biliary stent appears well positioned. However, there is mildly increased intrahepatic biliary dilatation. No focal hepatic abnormalities are identified. There is mild nonspecific gallbladder wall thickening. The ill-defined mass involving the pancreatic body appears slightly smaller, measuring 3.6 x 2.4 cm. This is less well-defined with adjacent soft tissue stranding, possibly due to interval therapy. Pancreatic ductal dilatation in the tail is unchanged. The pancreatic mass results in venous occlusion at the SMV - splenic vein confluence. Some thrombus is present within the superior mesenteric vein (axial image 35). The portal vein remains patent.  There is new wall thickening of the mid to distal stomach and the hepatic flexure and proximal  transverse colon. There is mild stranding in the surrounding fat. No significant ascites is demonstrated. There is no evidence of bowel obstruction.  The spleen, adrenal glands and right kidney appear stable. There is stable mild thickening of the lateral limb of the left adrenal gland. A  thick-walled cyst involving the lower pole of the left kidney is unchanged, measuring 2.0 cm on image 35.  Aortoiliac atherosclerosis, sigmoid colon diverticulosis and uterine fibroids are stable. There are prominent venous structures in the pelvis, larger on the left. The urinary bladder appears normal. There are no worrisome osseous findings.  IMPRESSION: 1. The known pancreatic body mass is less well-defined, possibly due to interval therapy. This mass results in occlusion at the SMV splenic vein confluence and there is increased thrombus in the superior mesenteric vein. 2. Biliary stent remains well positioned. There is mildly increased intrahepatic biliary dilatation. 3. New wall thickening of the distal stomach, hepatic flexure and proximal transverse colon, possibly related to radiation therapy. Focal infectious colitis considered less likely. 4. Stable thick-walled cyst involving the left kidney. 5. Stable dilated venous structures in the pelvis.   Electronically Signed   By: Roxy Horseman   On: 08/12/2013 19:01    MDM   1. Superior mesenteric vein thrombosis   2. Nausea   3. Vomiting   4. Abdominal pain   5. Hypokalemia   6. DM (diabetes mellitus)    4:59 PM 57 y.o. female with a history of pancreatic cancer status post ERCP with biliary stent who just finished chemotherapy and radiation 3 days ago and presents with nausea and vomiting for the last 3 weeks. The patient states that she is unable to tolerate intake by mouth due to nausea. She is afebrile and vital signs are unremarkable here. She denies any fevers at home. Will get screening labs, IV fluid and nausea control. She does note nonfocal vague abdominal pain, will get CT to evaluate.  SMV vein thrombosis seen on CT. Discussed w/ on call Vasc Surg. Will heparinize and admit to hospitalist.       Junius Argyle, MD 08/13/13 1349

## 2013-08-12 NOTE — H&P (Addendum)
[facility] History and Physical  Nami Strawder MVH:[id number] DOB: October 04, 1956 DOA: 08/12/2013  Referring physician: ED physician PCP: Dorrene German, MD   Chief Complaint: abdominal pain, nausea and vomiting  HPI:  57 year old female with past medical history of pancreatic cancer and related obstructive jaundice, status post ERCP and biliary stent placement on 05/22/13, on chemotherapy with Xeloda and RT to pancreas (last treatment with chemotherapy and RT on 08/09/2013), further history of hypertension, diabetes and related diabetic neuropathy, anxiety and depression who presented to [facility] ED for ongoing nausea, vomiting and diffuse abdominal pain for past couple of weeks. Patient reports an ongoing non bloody vomiting and poor oral intake. Her abdominal pain is constant but relatively controlled with hydromorphone she takes at home. Patient has no associated fever or chills, cough or shortness of breath. No blood in stool or urine. No chest pain and no palpitations. No loss of consciousness. In ED, vital are stable with BP 156/70, HR 56 -82 and T 98.8 F. Oxygen saturation was 99% on room air. CBC was unremarkable but BMP revealed mild hypokalemia of 3.3 repleted in ED. CT abdomen revealed known pancreatic  mass and resulting occlusion at the SMV, biliary stent with mildly increased intrahepatic biliary dilatation. Pt was started on Lovenox for SMV thrombosis. Pt also received 1 dose of 0.5 mg dilaudid IV for pain.   Assessment and Plan:  Principal Problem:   Pancreatic cancer and related abdominal pain, nausea and vomiting - pt has completed chemotherapy with Xeloda and RT to pancreas 08/09/2013 - continue supportive care with IV fluids, antiemetics and analgesia (dilaudid 1 mg every 3 hours IV PRN severe pain) - repleted electrolytes as needed  Active Problems:   DM (diabetes mellitus) - pt with poor oral intake, normally on novolog 10 units TID AC regimen and Lantus but will only continue  Lantus at half the dosage due to poor intake so 10 units at bedtime instead of 20 units - use sliding scale   HTN (hypertension) - continue Norvasc and lisinopril - renal function is WNL   Dyslipidemia - continue statin therapy   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 Negative for diaphoresis.  HENT: Negative for hearing loss, ear pain, nosebleeds, congestion, sore throat, neck pain, tinnitus and ear discharge.   Eyes: Negative for blurred vision, double vision, photophobia, pain, discharge and redness.  Respiratory: Negative for cough, hemoptysis, sputum production, shortness of breath, wheezing and stridor.   Cardiovascular: Negative for chest pain, palpitations, orthopnea, claudication and leg swelling.  Gastrointestinal: positive  for nausea, vomiting and abdominal pain. Negative for heartburn, constipation, blood in stool and melena.  Genitourinary: Negative for dysuria, urgency, frequency, hematuria and flank pain.  Musculoskeletal: Negative for myalgias, back pain, joint pain and falls.  Skin: Negative for itching and rash.  Neurological: Negative for dizziness and weakness. Negative for tingling, tremors, sensory change, speech change, focal weakness, loss of consciousness and headaches.  Endo/Heme/Allergies: Negative for environmental allergies and polydipsia. Does not bruise/bleed easily.  Psychiatric/Behavioral: Negative for suicidal ideas. The patient is not  nervous/anxious.      Past Medical History  Diagnosis Date  . Diabetes mellitus without complication   . Hypertension   . Depression   . Anxiety   . Hyperlipidemia   . Pancreatitis   . GERD (gastroesophageal reflux disease)   . Pancreatic cancer     Past Surgical History  Procedure Laterality Date  . Appendectomy    . Ercp N/A 05/24/2013    Procedure: ENDOSCOPIC RETROGRADE CHOLANGIOPANCREATOGRAPHY (ERCP);  Surgeon: Rachael Fee, MD;  Location: [facility] OR;  Service: Endoscopy;  Laterality: N/A;  MAC vs. general per anesthesia   . Tubal ligation      Social History:  reports that she quit smoking about 2 months ago. She does not have any smokeless tobacco history on file. She reports that she does not drink alcohol or use illicit drugs.  Allergies  Allergen Reactions  . Betadine [Povidone Iodine] Itching and Rash    Family History  Problem Relation Age of Onset  . Diabetes type II Mother   . Hypertension Mother   . Heart attack Father   . HIV/AIDS Brother     Prior to Admission medications   Medication Sig Start Date End Date Taking? Authorizing Provider  amLODipine (NORVASC) 10 MG tablet Take 1 tablet (10 mg total) by mouth daily. 07/15/13  Yes Rana Snare, NP  gabapentin (NEURONTIN) 300 MG capsule Take 300 mg by mouth 3 (three) times daily.   Yes Historical Provider, MD  HYDROmorphone (DILAUDID) 4 MG tablet Take 0.5-1 tablets (2-4 mg total) by mouth every 4 (four) hours as needed for pain. 07/15/13  Yes Rana Snare, NP  ibuprofen (ADVIL) 200 MG tablet Take 600 mg by mouth every 6 (six) hours as needed for pain.   Yes Historical Provider, MD  insulin aspart (NOVOLOG) 100 UNIT/ML injection Inject 10 Units into the skin 3 (three) times daily with meals.   Yes Historical Provider, MD  insulin glargine (LANTUS) 100 UNIT/ML injection Inject 20 Units into the skin at bedtime.   Yes Historical Provider, MD  lisinopril (PRINIVIL,ZESTRIL) 20 MG tablet Take 1 tablet (20 mg total) by  mouth daily. 07/15/13  Yes Rana Snare, NP  ondansetron (ZOFRAN-ODT) 4 MG disintegrating tablet Take 4 mg by mouth every 8 (eight) hours as needed for nausea. 07/23/13  Yes Jonna Coup, MD  polyethylene glycol [facility] Ethelene Hal) packet Take 17 g by mouth daily as needed (constipation).   Yes Historical Provider, MD  potassium chloride (K-DUR,KLOR-CON) 10 MEQ tablet Take 10 mEq by mouth daily.   Yes Historical Provider, MD  simethicone (MYLICON) 125 MG chewable tablet Chew 125 mg by mouth every 6 (six) hours as needed for flatulence.   Yes Historical Provider, MD  simvastatin (ZOCOR) 10 MG tablet Take 10 mg by mouth at bedtime.   Yes Historical Provider, MD  capecitabine (XELODA) 500 MG tablet Take 3 tablets (1,500 mg total) by mouth 2 (two) times daily after a meal. On days of radiation only (Monday-Friday) 3000 mg total daily dose 06/28/13   Ladene Artist, MD  hyaluronate sodium (RADIAPLEXRX) GEL Apply 1 application topically 2 (two) times daily.    Historical Provider, MD  polyethylene glycol powder (GLYCOLAX/MIRALAX) powder Take 17 g by mouth daily. Mix 17 gram powder any with 8 oz liquid 07/15/13   Rana Snare, NP    Physical Exam: Filed Vitals:   08/12/13 1844 08/12/13 1915 08/12/13 2000  08/12/13 2100  BP: 165/72  156/70 162/75  Pulse: 72 71 65 56  Temp:      TempSrc:      Resp: 18  18 16   SpO2: 100% 100% 100% 100%    Physical Exam  Constitutional: Appears well-developed and well-nourished. No distress.  HENT: Normocephalic. External right and left ear normal. Oropharynx is clear and moist.  Eyes: Conjunctivae and EOM are normal. PERRLA, no scleral icterus.  Neck: Normal ROM. Neck supple. No JVD. No tracheal deviation. No thyromegaly.  CVS: RRR, S1/S2 appreciated Pulmonary: Effort and breath sounds normal, no stridor, rhonchi, wheezes, rales.  Abdominal: Soft. BS +,  no distension, mild tenderness in RUQ but no rebound or guarding.  Musculoskeletal: Normal range of motion.  No edema and no tenderness.  Lymphadenopathy: No lymphadenopathy noted, cervical, inguinal. Neuro: Alert. Normal reflexes, muscle tone coordination. No cranial nerve deficit. Skin: Skin is warm and dry. No rash noted. Not diaphoretic. No erythema. No pallor.  Psychiatric: Normal mood and affect. Behavior, judgment, thought content normal.   Labs on Admission:  Basic Metabolic Panel:  Recent Labs Lab 08/12/13 1535  NA 136  K 3.2*  CL 100  CO2 28  GLUCOSE 176*  BUN 14  CREATININE 0.63  CALCIUM 9.9   Liver Function Tests:  Recent Labs Lab 08/12/13 1535  AST 16  ALT 12  ALKPHOS 135*  BILITOT 0.4  PROT 8.1  ALBUMIN 3.9    Recent Labs Lab 08/12/13 1535  LIPASE 9*   No results found for this basename: AMMONIA,  in the last 168 hours CBC:  Recent Labs Lab 08/12/13 1535  WBC 4.9  NEUTROABS 3.6  HGB 12.5  HCT 36.1  MCV 86.0  PLT 150   Cardiac Enzymes: No results found for this basename: CKTOTAL, CKMB, CKMBINDEX, TROPONINI,  in the last 168 hours BNP: No components found with this basename: POCBNP,  CBG: No results found for this basename: GLUCAP,  in the last 168 hours  Radiological Exams on Admission: Ct Abdomen Pelvis W Contrast 08/12/2013   IMPRESSION: 1. The known pancreatic body mass is less well-defined, possibly due to interval therapy. This mass results in occlusion at the SMV splenic vein confluence and there is increased thrombus in the superior mesenteric vein. 2. Biliary stent remains well positioned. There is mildly increased intrahepatic biliary dilatation. 3. New wall thickening of the distal stomach, hepatic flexure and proximal transverse colon, possibly related to radiation therapy. Focal infectious colitis considered less likely. 4. Stable thick-walled cyst involving the left kidney. 5. Stable dilated venous structures in the pelvis.   Electronically Signed   By: Roxy Horseman   On: 08/12/2013 19:01    EKG: Normal sinus rhythm, no ST/T wave  changes  Debbora Presto, MD  [facility] Pager [phone]  If 7PM-7AM, please contact night-coverage [email or web address] Password [facility] 08/12/2013, 9:43 PM

## 2013-08-13 ENCOUNTER — Encounter (HOSPITAL_COMMUNITY): Payer: Self-pay | Admitting: Physician Assistant

## 2013-08-13 ENCOUNTER — Ambulatory Visit: Payer: Medicaid Other

## 2013-08-13 DIAGNOSIS — R933 Abnormal findings on diagnostic imaging of other parts of digestive tract: Secondary | ICD-10-CM

## 2013-08-13 DIAGNOSIS — R109 Unspecified abdominal pain: Principal | ICD-10-CM

## 2013-08-13 DIAGNOSIS — I10 Essential (primary) hypertension: Secondary | ICD-10-CM

## 2013-08-13 DIAGNOSIS — K55059 Acute (reversible) ischemia of intestine, part and extent unspecified: Secondary | ICD-10-CM

## 2013-08-13 DIAGNOSIS — E785 Hyperlipidemia, unspecified: Secondary | ICD-10-CM

## 2013-08-13 DIAGNOSIS — E119 Type 2 diabetes mellitus without complications: Secondary | ICD-10-CM

## 2013-08-13 DIAGNOSIS — C250 Malignant neoplasm of head of pancreas: Secondary | ICD-10-CM

## 2013-08-13 LAB — COMPREHENSIVE METABOLIC PANEL
ALT: 12 U/L (ref 0–35)
AST: 15 U/L (ref 0–37)
AST: 16 U/L (ref 0–37)
Albumin: 3.4 g/dL — ABNORMAL LOW (ref 3.5–5.2)
CO2: 25 mEq/L (ref 19–32)
CO2: 24 mEq/L (ref 19–32)
Calcium: 8.7 mg/dL (ref 8.4–10.5)
Calcium: 9.2 mg/dL (ref 8.4–10.5)
Creatinine, Ser: 0.6 mg/dL (ref 0.50–1.10)
Creatinine, Ser: 0.62 mg/dL (ref 0.50–1.10)
GFR calc Af Amer: >90 mL/min (ref >90)
GFR calc non Af Amer: >90 mL/min (ref >90)
GFR calc non Af Amer: >90 mL/min (ref >90)
Glucose, Bld: 158 mg/dL — ABNORMAL HIGH (ref 70–99)
Potassium: 3.3 mEq/L — ABNORMAL LOW (ref 3.5–5.1)
Sodium: 137 mEq/L (ref 135–145)
Total Bilirubin: 0.4 mg/dL (ref 0.3–1.2)
Total Protein: 6.8 g/dL (ref 6.0–8.3)
Total Protein: 7.2 g/dL (ref 6.0–8.3)

## 2013-08-13 LAB — PROTIME-INR
INR: 1.44 (ref 0.00–1.49)
INR: 1.44 (ref 0.00–1.49)
Prothrombin Time: 17.2 seconds — ABNORMAL HIGH (ref 11.6–15.2)

## 2013-08-13 LAB — CBC WITH DIFFERENTIAL/PLATELET
Basophils Absolute: 0 10*3/uL (ref 0.0–0.1)
Basophils Relative: 0 % (ref 0–1)
HCT: 32.1 % — ABNORMAL LOW (ref 36.0–46.0)
Hemoglobin: 11 g/dL — ABNORMAL LOW (ref 12.0–15.0)
Lymphocytes Relative: 20 % (ref 12–46)
Monocytes Relative: 7 % (ref 3–12)
Neutro Abs: 2.5 10*3/uL (ref 1.7–7.7)
Platelets: 125 10*3/uL — ABNORMAL LOW (ref 150–400)
RDW: 15.2 % (ref 11.5–15.5)
WBC: 3.8 10*3/uL — ABNORMAL LOW (ref 4.0–10.5)

## 2013-08-13 LAB — CBC
MCH: 28.8 pg (ref 26.0–34.0)
MCHC: 33.1 g/dL (ref 30.0–36.0)
Platelets: 128 10*3/uL — ABNORMAL LOW (ref 150–400)
RBC: 3.92 MIL/uL (ref 3.87–5.11)
RDW: 15.5 % (ref 11.5–15.5)

## 2013-08-13 LAB — GLUCOSE, CAPILLARY: Glucose-Capillary: 320 mg/dL — ABNORMAL HIGH (ref 70–99)

## 2013-08-13 LAB — PHOSPHORUS: Phosphorus: 3.5 mg/dL (ref 2.3–4.6)

## 2013-08-13 LAB — APTT: aPTT: 30 seconds (ref 24–37)

## 2013-08-13 MED ORDER — PANTOPRAZOLE SODIUM 40 MG PO TBEC
40.0000 mg | DELAYED_RELEASE_TABLET | Freq: Every day | ORAL | Status: DC
  Administered 2013-08-14: 40 mg via ORAL
  Filled 2013-08-13: qty 1

## 2013-08-13 MED ORDER — WARFARIN SODIUM 5 MG PO TABS
5.0000 mg | ORAL_TABLET | Freq: Once | ORAL | Status: AC
  Administered 2013-08-13: 5 mg via ORAL
  Filled 2013-08-13: qty 1

## 2013-08-13 MED ORDER — WARFARIN - PHARMACIST DOSING INPATIENT
Freq: Every day | Status: DC
Start: 2013-08-13 — End: 2013-08-14

## 2013-08-13 MED ORDER — POTASSIUM CHLORIDE CRYS ER 20 MEQ PO TBCR
40.0000 meq | EXTENDED_RELEASE_TABLET | Freq: Once | ORAL | Status: AC
  Administered 2013-08-13: 40 meq via ORAL
  Filled 2013-08-13: qty 2

## 2013-08-13 MED ORDER — PNEUMOCOCCAL VAC POLYVALENT 25 MCG/0.5ML IJ INJ
0.5000 mL | INJECTION | INTRAMUSCULAR | Status: AC
  Filled 2013-08-13: qty 0.5

## 2013-08-13 MED ORDER — GLUCERNA SHAKE PO LIQD
237.0000 mL | Freq: Three times a day (TID) | ORAL | Status: DC
  Administered 2013-08-14: 237 mL via ORAL

## 2013-08-13 MED ORDER — COUMADIN BOOK
Freq: Once | Status: AC
  Administered 2013-08-13: 17:00:00
  Filled 2013-08-13: qty 1

## 2013-08-13 MED ORDER — INSULIN ASPART 100 UNIT/ML ~~LOC~~ SOLN
0.0000 [IU] | Freq: Every day | SUBCUTANEOUS | Status: DC
  Administered 2013-08-13: 4 [IU] via SUBCUTANEOUS

## 2013-08-13 MED ORDER — WARFARIN VIDEO: Freq: Once | Status: DC

## 2013-08-13 MED ORDER — INFLUENZA VAC SPLIT QUAD 0.5 ML IM SUSP
0.5000 mL | INTRAMUSCULAR | Status: AC
  Filled 2013-08-13: qty 0.5

## 2013-08-13 MED ORDER — INSULIN ASPART 100 UNIT/ML ~~LOC~~ SOLN
0.0000 [IU] | Freq: Three times a day (TID) | SUBCUTANEOUS | Status: DC
  Administered 2013-08-14: 3 [IU] via SUBCUTANEOUS
  Administered 2013-08-14: 5 [IU] via SUBCUTANEOUS

## 2013-08-13 NOTE — Progress Notes (Signed)
[facility] PROGRESS NOTE  [name] [name] BJY:[id number] DOB: Jun 19, 1956 DOA: 08/12/2013 PCP: Dorrene German, MD  Assessment/Plan: 1. Adenocarcinoma of pancreas. Status post ERCP on 05/24/2013 with stent placement, procedure performed by Dr. Christella Hartigan. She began XRT and Xeloda therapy on 07/20/2013. She is currently being seen at the [facility]. Presented with complaints of abdominal pain, nausea and vomiting overnight. CT scan of abdomen on admission showing known pancreatic mass and resulting occlusion at the SMV,  biliary stents with associated mild increased intrahepatic biliary dilatation. I have discussed case with GI who will consult. Lab work showing stable transaminases. She reports feeling a little better this morning and actually had been asking for a diet. We'll keep her n.p.o. until she is seen by GI.  2. Superior mesenteric vein thrombosis. Patient with history of pancreatic cancer, undergoing chemotherapy. Started on therapeutic Lovenox. 3. History of obstructive jaundice. Status post ERCP with stent placement in July 2014, GI consult to, will await further recommendations. 4. Hypertension. Blood pressures remain elevated with systolic blood pressures in the 160s to 170s. She is on 10 mg of amlodipine and 20 mg of lisinopril. Will increase lisinopril dose to 20 mg by mouth twice a day. 5. Insulin-dependent diabetes mellitus. Blood sugars are stable, continue insulin glargine 10 units subcutaneous daily with Accu-Cheks and sliding scale coverage. 6. Hypokalemia. Patient having a potassium of 3.3 this morning, likely secondary to diarrhea,  will administer 40 mEq of the by mouth potassium 7. Nutrition. Currently n.p.o. 8. DVT prophylaxis. On full dose Lovenox  Code Status: Full code Family Communication: Case discussed with patient at bedside Disposition Plan: GI consulted, await further recommendations   Consultants:  GI   HPI/Subjective: Patient is a 57 year old  with history of adenocarcinoma of pancreas, stage II/III with obstructing head mass, status post ERCP on 05/24/2013 with stent placement, procedure performed by Dr. Christella Hartigan. She has been on XRT and Xeloda  therapy at the cancer Center. Patient admitted overnight, presented with increasing pain to her abdomen and associated nausea and vomiting. This morning she reports feeling better, asking for diet.  Objective: Filed Vitals:   08/13/13 0600  BP: 171/81  Pulse: 83  Temp: 99 F (37.2 C)  Resp: 20    Intake/Output Summary (Last 24 hours) at 08/13/13 1301 Last data filed at 08/13/13 0730  Gross per 24 hour  Intake   1860 ml  Output    150 ml  Net   1710 ml   Filed Weights   08/13/13 1006  Weight: 67.9 kg (149 lb 11.1 oz)    Exam:   General:  Patient is in no acute distress, she is awake alert oriented  Cardiovascular: regular rate and rhythm normal S1-S2 no murmurs rubs or gallops  Respiratory: lungs are clear to auscultation bilaterally no wheezing rhonchi or rales  Abdomen: patient had a benign abdominal examination for me today, there is no rebound tenderness or guarding.  I do not no peritoneal signs.  Musculoskeletal: present range of motion to all extremities   Data Reviewed: Basic Metabolic Panel:  Recent Labs Lab 08/12/13 1535 08/13/13 0100 08/13/13 0500  NA 136 138 137  K 3.2* 3.3* 3.3*  CL 100 102 102  CO2 28 25 24   GLUCOSE 176* 164* 158*  BUN 14 12 12   CREATININE 0.63 0.60 0.62  CALCIUM 9.9 8.7 9.2  MG  --  1.6  --   PHOS  --  3.5  --    Liver Function Tests:  Recent  Labs Lab 08/12/13 1535 08/13/13 0100 08/13/13 0500  AST 16 15 16   ALT 12 11 12   ALKPHOS 135* 121* 121*  BILITOT 0.4 0.4 0.4  PROT 8.1 6.8 7.2  ALBUMIN 3.9 3.4* 3.5    Recent Labs Lab 08/12/13 1535  LIPASE 9*   No results found for this basename: AMMONIA,  in the last 168 hours CBC:  Recent Labs Lab 08/12/13 1535 08/13/13 0100 08/13/13 0500  WBC 4.9 3.8* 3.6*   NEUTROABS 3.6 2.5  --   HGB 12.5 11.0* 11.3*  HCT 36.1 32.1* 34.1*  MCV 86.0 86.3 87.0  PLT 150 125* 128*   Cardiac Enzymes: No results found for this basename: CKTOTAL, CKMB, CKMBINDEX, TROPONINI,  in the last 168 hours BNP (last 3 results) No results found for this basename: PROBNP,  in the last 8760 hours CBG:  Recent Labs Lab 08/12/13 2330 08/13/13 0630  GLUCAP 157* 179*    No results found for this or any previous visit (from the past 240 hour(s)).   Studies: Ct Abdomen Pelvis W Contrast  08/12/2013   CLINICAL DATA:  Nonfocal abdominal pain with nausea. History of diabetes, hypertension, pancreatitis and pancreatic cancer. Prior appendectomy.  EXAM: CT ABDOMEN AND PELVIS WITH CONTRAST  TECHNIQUE: Multidetector CT imaging of the abdomen and pelvis was performed using the standard protocol following bolus administration of intravenous contrast.  CONTRAST:  OMNIPAQUE IOHEXOL 300 MG/ML  SOLN  COMPARISON:  Abdominal pelvic CT 05/28/2013. PET-CT 06/09/2013.  FINDINGS: There is stable mild left basilar atelectasis. No significant pleural or pericardial effusion is present.  Biliary stent appears well positioned. However, there is mildly increased intrahepatic biliary dilatation. No focal hepatic abnormalities are identified. There is mild nonspecific gallbladder wall thickening. The ill-defined mass involving the pancreatic body appears slightly smaller, measuring 3.6 x 2.4 cm. This is less well-defined with adjacent soft tissue stranding, possibly due to interval therapy. Pancreatic ductal dilatation in the tail is unchanged. The pancreatic mass results in venous occlusion at the SMV - splenic vein confluence. Some thrombus is present within the superior mesenteric vein (axial image 35). The portal vein remains patent.  There is new wall thickening of the mid to distal stomach and the hepatic flexure and proximal transverse colon. There is mild stranding in the surrounding fat. No  significant ascites is demonstrated. There is no evidence of bowel obstruction.  The spleen, adrenal glands and right kidney appear stable. There is stable mild thickening of the lateral limb of the left adrenal gland. A thick-walled cyst involving the lower pole of the left kidney is unchanged, measuring 2.0 cm on image 35.  Aortoiliac atherosclerosis, sigmoid colon diverticulosis and uterine fibroids are stable. There are prominent venous structures in the pelvis, larger on the left. The urinary bladder appears normal. There are no worrisome osseous findings.  IMPRESSION: 1. The known pancreatic body mass is less well-defined, possibly due to interval therapy. This mass results in occlusion at the SMV splenic vein confluence and there is increased thrombus in the superior mesenteric vein. 2. Biliary stent remains well positioned. There is mildly increased intrahepatic biliary dilatation. 3. New wall thickening of the distal stomach, hepatic flexure and proximal transverse colon, possibly related to radiation therapy. Focal infectious colitis considered less likely. 4. Stable thick-walled cyst involving the left kidney. 5. Stable dilated venous structures in the pelvis.   Electronically Signed   By: Roxy Horseman   On: 08/12/2013 19:01    Scheduled Meds: . amLODipine  10 mg  Oral Daily  . enoxaparin (LOVENOX) injection  70 mg Subcutaneous Q12H  . gabapentin  300 mg Oral TID  . [START ON 08/14/2013] influenza vac split quadrivalent PF  0.5 mL Intramuscular Tomorrow-1000  . insulin glargine  10 Units Subcutaneous QHS  . lisinopril  20 mg Oral Daily  . [START ON 08/14/2013] pneumococcal 23 valent vaccine  0.5 mL Intramuscular Tomorrow-1000  . potassium chloride  10 mEq Oral Daily  . simvastatin  10 mg Oral QHS  . sodium chloride  3 mL Intravenous Q12H   Continuous Infusions: . sodium chloride 75 mL/hr at 08/13/13 0730    Principal Problem:   Abdominal pain Active Problems:   Obstructive jaundice    DM (diabetes mellitus)   HTN (hypertension)   Dyslipidemia   Malignant neoplasm of head of pancreas   Superior mesenteric vein thrombosis   Hypokalemia   Nausea and vomiting    Time spent: 35 minutes    Jeralyn Bennett  [facility] Pager [phone]. If 7PM-7AM, please contact night-coverage at [email or web address], password [facility] 08/13/2013, 1:01 PM  LOS: 1 day

## 2013-08-13 NOTE — Evaluation (Signed)
Physical Therapy Evaluation Patient Details Name: [name] [name] MRN: [id number] DOB: [birth year] Today's Date: 08/13/2013 Time: 4098-1191 PT Time Calculation (min): 23 min  PT Assessment / Plan / Recommendation History of Present Illness  57 year old female with past medical history of pancreatic cancer and related obstructive jaundice, status post ERCP and biliary stent placement on 05/22/13, on chemotherapy with Xeloda and RT to pancreas pt admitted with abdominal pain  Clinical Impression  Pt slightly confused with difficulty recalling plan for tx session with slightly scattered thought. Pt reports bil LE weakness although functional and that she has decreased activity tolerance and fatigued with limited gait. Pt will benefit from acute therapy to address these as well as below deficits to increase independence. Encouraged daily ambulation with nursing staff.     PT Assessment  Patient needs continued PT services    Follow Up Recommendations  No PT follow up    Does the patient have the potential to tolerate intense rehabilitation      Barriers to Discharge        Equipment Recommendations  None recommended by PT    Recommendations for Other Services     Frequency Min 3X/week    Precautions / Restrictions Precautions Precautions: Fall   Pertinent Vitals/Pain No pain VSS      Mobility  Bed Mobility Bed Mobility: Supine to Sit Supine to Sit: 6: Modified independent (Device/Increase time);HOB flat;With rails Transfers Transfers: Sit to Stand;Stand to Sit Sit to Stand: 6: Modified independent (Device/Increase time);From bed;From chair/3-in-1 Stand to Sit: 6: Modified independent (Device/Increase time);To chair/3-in-1 Ambulation/Gait Ambulation/Gait Assistance: 4: Min guard Ambulation Distance (Feet): 300 Feet Assistive device: None Ambulation/Gait Assistance Details: pt with generally steady gait but one LOB with assist to correct when attending to object in  environment Gait Pattern: Step-through pattern Gait velocity: WFL Stairs: No    Exercises     PT Diagnosis: Difficulty walking  PT Problem List: Decreased balance;Decreased activity tolerance PT Treatment Interventions: Gait training;Functional mobility training;Balance training;Patient/family education;Therapeutic activities;Therapeutic exercise     PT Goals(Current goals can be found in the care plan section) Acute Rehab PT Goals Patient Stated Goal: be able to walk farther and get stronger PT Goal Formulation: With patient Time For Goal Achievement: 08/20/13 Potential to Achieve Goals: Good  Visit Information  Last PT Received On: 08/13/13 Assistance Needed: +1 History of Present Illness: 57 year old female with past medical history of pancreatic cancer and related obstructive jaundice, status post ERCP and biliary stent placement on 05/22/13, on chemotherapy with Xeloda and RT to pancreas pt admitted with abdominal pain       Prior Functioning  Home Living Family/patient expects to be discharged to:: Private residence Living Arrangements: Children Available Help at Discharge: Family;Available 24 hours/day Type of Home: Apartment Home Access: Level entry Home Layout: One level Home Equipment: Cane - single point Prior Function Level of Independence: Independent Communication Communication: HOH    Cognition  Cognition Arousal/Alertness: Awake/alert Behavior During Therapy: WFL for tasks assessed/performed Overall Cognitive Status: Impaired/Different from baseline Area of Impairment: Orientation Orientation Level: Time Memory: Decreased short-term memory    Extremity/Trunk Assessment Upper Extremity Assessment Upper Extremity Assessment: Overall WFL for tasks assessed Lower Extremity Assessment Lower Extremity Assessment: Overall WFL for tasks assessed (4/5 hip and knee flex bil, 5/5 bil knee extension) Cervical / Trunk Assessment Cervical / Trunk Assessment:  Normal   Balance Balance Balance Assessed: Yes Static Sitting Balance Static Sitting - Balance Support: No upper extremity supported Static Sitting -  Level of Assistance: 7: Independent Static Sitting - Comment/# of Minutes: 3 Static Standing Balance Static Standing - Balance Support: During functional activity Static Standing - Level of Assistance: 6: Modified independent (Device/Increase time) Static Standing - Comment/# of Minutes: 5, standing at the sink to bath face and buttock with and without single UE assist  End of Session PT - End of Session Equipment Utilized During Treatment: Gait belt Activity Tolerance: Patient tolerated treatment well Patient left: in chair;with call bell/phone within reach Nurse Communication: Mobility status  GP     Delorse Lek 08/13/2013, 10:09 AM Delaney Meigs, PT [phone]

## 2013-08-13 NOTE — Progress Notes (Signed)
Utilization Review Completed.[name] [name] T9/26/2014

## 2013-08-13 NOTE — Progress Notes (Signed)
ANTICOAGULATION CONSULT NOTE - Follow-up Consult  Pharmacy Consult for Coumadin and Lovenox Indication: SMV thrombus  Allergies  Allergen Reactions  . Betadine [Povidone Iodine] Itching and Rash    Patient Measurements: Height: 5\' 3"  (160 cm) Weight: 149 lb 11.1 oz (67.9 kg) IBW/kg (Calculated) : 52.4  Vital Signs: Temp: 99 F (37.2 C) (09/26 0600) Temp src: Oral (09/26 0600) BP: 171/81 mmHg (09/26 0600) Pulse Rate: 83 (09/26 0600)  Labs:  Recent Labs  08/12/13 1535 08/13/13 0100 08/13/13 0500  HGB 12.5 11.0* 11.3*  HCT 36.1 32.1* 34.1*  PLT 150 125* 128*  APTT  --  34 30  LABPROT  --  17.2* 17.2*  INR  --  1.44 1.44  CREATININE 0.63 0.60 0.62    Estimated Creatinine Clearance: 70 ml/min (by C-G formula based on Cr of 0.62).  Assessment: 57 y.o. female well known to pharmacy from lovenox dosing for SMV thrombus. Now to begin bridge to coumadin. This is day #1 of 5 minimum overlap. CBC remains stable. No bleeding reported. Baseline INR 1.44.  Goal of Therapy:  INR 2-3; Anti Xa level 0.6-1.2 mcg/ml (4 hrs post dose) Monitor platelets by anticoagulation protocol: Yes   Plan:  1. Coumadin 5mg  po today 2. Daily INR 3. Coumadin book and video  Christoper Fabian, PharmD, BCPS Clinical pharmacist, pager [phone] 08/13/2013,2:41 PM

## 2013-08-13 NOTE — Progress Notes (Addendum)
INITIAL NUTRITION ASSESSMENT  DOCUMENTATION CODES Per approved criteria  -Not Applicable   INTERVENTION: 1.  General healthful diet; encourage intake as able.   2.  Nutrition-related medication; nausea is pt's greatest barrier to intake.  Please continue anti-nausea regimen. 3.  Supplements; Glucerna Shake po BID, each supplement provides 220 kcal and 10 grams of protein.  NUTRITION DIAGNOSIS: Inadequate oral intake related to nausea, vomiting as evidenced by pt report.   Monitor:  1.  Food/Beverage; pt meeting >/=90% estimated needs with tolerance. 2.  Wt/wt change; monitor trends  Reason for Assessment: consult  57 y.o. female  Admitting Dx: Abdominal pain  ASSESSMENT: Pt admitted with nausea and vomiting, undergoing chemoradiation for pancreatic cancer.  RD met with pt who reports her usual wt is 149-151 lbs.  She states she has been able to maintain her wt during cancer treatment. Pt drinking milk and iced tea at time of visit.  She reports tolerance "so far."   Pt reports nausea prevents her from eating despite being hungry and having a good appetite.  She sometimes drinks Boost at home.  Pt largely prefers to talk about her history and current hospital experience to talking about her nutrition.  Nutrition Focused Physical Exam:  Subcutaneous Fat:  Orbital Region: mild-moderate Upper Arm Region: WNL Thoracic and Lumbar Region: WNL  Muscle:  Temple Region: WNL Clavicle Bone Region: WNL Clavicle and Acromion Bone Region: WNL Scapular Bone Region: WNL Dorsal Hand: WNL Patellar Region: n/a Anterior Thigh Region: n/a Posterior Calf Region: n/a  Edema: none present   Height: Ht Readings from Last 1 Encounters:  08/13/13 5\' 3"  (1.6 m)    Weight: Wt Readings from Last 1 Encounters:  08/13/13 149 lb 11.1 oz (67.9 kg)    Ideal Body Weight: 115 lbs  % Ideal Body Weight: 129%  Wt Readings from Last 10 Encounters:  08/13/13 149 lb 11.1 oz (67.9 kg)  08/09/13  149 lb 12.8 oz (67.949 kg)  08/03/13 150 lb 1.6 oz (68.085 kg)  07/30/13 151 lb 1.6 oz (68.539 kg)  07/23/13 151 lb 9.6 oz (68.765 kg)  07/21/13 154 lb (69.854 kg)  07/15/13 150 lb 6.4 oz (68.221 kg)  06/17/13 158 lb (71.668 kg)  06/23/13 152 lb 9.6 oz (69.219 kg)  06/17/13 158 lb (71.668 kg)    Usual Body Weight: 149-151 lbs per pt  % Usual Body Weight: 100%  BMI:  Body mass index is 26.52 kg/(m^2).  Estimated Nutritional Needs: Kcal: 2030-2170 Protein: 81-95g Fluid: ~2.0 L/day  Skin: intact  Diet Order: Full Liquid  EDUCATION NEEDS: -Education needs addressed   Intake/Output Summary (Last 24 hours) at 08/13/13 1424 Last data filed at 08/13/13 0730  Gross per 24 hour  Intake   1860 ml  Output    150 ml  Net   1710 ml    Last BM: 9/25  Labs:   Recent Labs Lab 08/12/13 1535 08/13/13 0100 08/13/13 0500  NA 136 138 137  K 3.2* 3.3* 3.3*  CL 100 102 102  CO2 28 25 24   BUN 14 12 12   CREATININE 0.63 0.60 0.62  CALCIUM 9.9 8.7 9.2  MG  --  1.6  --   PHOS  --  3.5  --   GLUCOSE 176* 164* 158*    CBG (last 3)   Recent Labs  08/12/13 2330 08/13/13 0630  GLUCAP 157* 179*    Scheduled Meds: . amLODipine  10 mg Oral Daily  . enoxaparin (LOVENOX) injection  70 mg  Subcutaneous Q12H  . gabapentin  300 mg Oral TID  . [START ON 08/14/2013] influenza vac split quadrivalent PF  0.5 mL Intramuscular Tomorrow-1000  . insulin glargine  10 Units Subcutaneous QHS  . lisinopril  20 mg Oral Daily  . [START ON 08/14/2013] pantoprazole  40 mg Oral Q0600  . [START ON 08/14/2013] pneumococcal 23 valent vaccine  0.5 mL Intramuscular Tomorrow-1000  . potassium chloride  10 mEq Oral Daily  . simvastatin  10 mg Oral QHS  . sodium chloride  3 mL Intravenous Q12H    Continuous Infusions: . sodium chloride 75 mL/hr at 08/13/13 0730    Past Medical History  Diagnosis Date  . DM (diabetes mellitus), type 2     requires insulin  . Hypertension   . Depression   .  Anxiety   . Hyperlipidemia   . Pancreatitis   . GERD (gastroesophageal reflux disease)   . Pancreatic cancer 05/2013    adenocarcinoma on ERCP/FNA  . Hearing impaired     lost the last of her hearing aids and cn not get another one.  hearing is better via right ear.     Past Surgical History  Procedure Laterality Date  . Appendectomy    . Ercp N/A 05/24/2013    Procedure: ENDOSCOPIC RETROGRADE CHOLANGIOPANCREATOGRAPHY (ERCP);  Surgeon: Rachael Fee, MD;  Location: [facility];  Service: Endoscopy;  Laterality: N/A;  MAC vs. general per anesthesia   . Tubal ligation      Loyce Dys, MS RD LDN Clinical Inpatient Dietitian Pager: [phone] Weekend/After hours pager: [phone]

## 2013-08-13 NOTE — Progress Notes (Signed)
Pt is tolerating liquids well, no N&V.  Pt has not complained of severe pain in the abdomen since this AM.

## 2013-08-13 NOTE — Evaluation (Signed)
Occupational Therapy Evaluation and Discharge Summary Patient Details Name: [name] [name] MRN: [id number] DOB: 1956/10/14 Today's Date: 08/13/2013 Time: 1191-4782 OT Time Calculation (min): 13 min  OT Assessment / Plan / Recommendation History of present illness 57 year old female with past medical history of pancreatic cancer and related obstructive jaundice, status post ERCP and biliary stent placement on 05/22/13, on chemotherapy with Xeloda and RT to pancreas pt admitted with abdominal pain   Clinical Impression   Pt admitted with above diagnosis and is close to or at baseline with basic adls.  Pt has 24/7 S available if needed and PT is following for ambulation goals.    OT Assessment  Patient does not need any further OT services    Follow Up Recommendations  No OT follow up    Barriers to Discharge      Equipment Recommendations  None recommended by OT    Recommendations for Other Services    Frequency       Precautions / Restrictions Precautions Precautions: Fall Restrictions Weight Bearing Restrictions: No   Pertinent Vitals/Pain Pt c/o 6/10 pain in stomach.  Nursing aware.    ADL  Eating/Feeding: Performed;Independent Where Assessed - Eating/Feeding: Chair Grooming: Performed;Wash/dry hands;Wash/dry face;Teeth care;Supervision/safety Where Assessed - Grooming: Unsupported standing Upper Body Bathing: Performed;Set up Where Assessed - Upper Body Bathing: Unsupported sitting Lower Body Bathing: Performed;Supervision/safety Where Assessed - Lower Body Bathing: Unsupported sit to stand Upper Body Dressing: Performed;Set up Where Assessed - Upper Body Dressing: Unsupported sitting Lower Body Dressing: Performed;Supervision/safety Where Assessed - Lower Body Dressing: Unsupported sit to stand Toilet Transfer: Performed;Supervision/safety Toilet Transfer Method: Other (comment) (walked to br) Toilet Transfer Equipment: Comfort height toilet;Grab bars Toileting -  Clothing Manipulation and Hygiene: Performed;Supervision/safety Where Assessed - Toileting Clothing Manipulation and Hygiene: Sit to stand from 3-in-1 or toilet Transfers/Ambulation Related to ADLs: Pt walked in room for adls with no lob.  Pt does not pay attn to IV line and would have pulled it out if therpist not there.  Reminded pt to call for assist when she gets up. ADL Comments: Pt does well with all basic adls.  Pt has 24/7 S at home if neede.    OT Diagnosis:    OT Problem List:   OT Treatment Interventions:     OT Goals(Current goals can be found in the care plan section) Acute Rehab OT Goals Patient Stated Goal: be able to walk farther and get stronger  Visit Information  Last OT Received On: 08/13/13 Assistance Needed: +1 History of Present Illness: 57 year old female with past medical history of pancreatic cancer and related obstructive jaundice, status post ERCP and biliary stent placement on 05/22/13, on chemotherapy with Xeloda and RT to pancreas pt admitted with abdominal pain       Prior Functioning     Home Living Family/patient expects to be discharged to:: Private residence Living Arrangements: Children Available Help at Discharge: Family;Available 24 hours/day Type of Home: Apartment Home Access: Level entry Home Layout: One level Home Equipment: Cane - single point Prior Function Level of Independence: Independent Communication Communication: HOH Dominant Hand: Right         Vision/Perception Vision - History Baseline Vision: No visual deficits Patient Visual Report: No change from baseline Vision - Assessment Vision Assessment: Vision not tested   Cognition  Cognition Arousal/Alertness: Awake/alert Behavior During Therapy: WFL for tasks assessed/performed Overall Cognitive Status: Impaired/Different from baseline Area of Impairment: Problem solving Orientation Level: Time Memory: Decreased short-term memory Problem Solving:  Slow  processing General Comments: This may be pt's baseline cognitive status.  No family available to ask.  Pts family can assist pt with paying bills etc.    Extremity/Trunk Assessment Upper Extremity Assessment Upper Extremity Assessment: Overall WFL for tasks assessed Lower Extremity Assessment Lower Extremity Assessment: Defer to PT evaluation Cervical / Trunk Assessment Cervical / Trunk Assessment: Normal     Mobility Bed Mobility Bed Mobility: Supine to Sit Supine to Sit: 6: Modified independent (Device/Increase time);HOB flat;With rails Transfers Transfers: Sit to Stand;Stand to Sit Sit to Stand: 6: Modified independent (Device/Increase time);From bed;From chair/3-in-1 Stand to Sit: 6: Modified independent (Device/Increase time);To chair/3-in-1 Details for Transfer Assistance: No assist needed.     Exercise     Balance Balance Balance Assessed: Yes Static Sitting Balance Static Sitting - Balance Support: No upper extremity supported Static Sitting - Level of Assistance: 7: Independent Static Sitting - Comment/# of Minutes: 5 Static Standing Balance Static Standing - Balance Support: During functional activity Static Standing - Level of Assistance: 6: Modified independent (Device/Increase time) Static Standing - Comment/# of Minutes: 3standing at sink Standardized Balance Assessment Standardized Balance Assessment: Berg Balance Test   End of Session OT - End of Session Activity Tolerance: Patient tolerated treatment well Patient left: in chair;with call bell/phone within reach Nurse Communication: Mobility status  GO     Hope Budds 08/13/2013, 10:51 AM [phone]

## 2013-08-13 NOTE — Consult Note (Signed)
[facility] Consult: 1:04 PM 08/13/2013   Referring Provider: Dr Vanessa Barbara, [facility].   Primary Care Physician:  Dorrene German, MD Primary Gastroenterologist:  Dr. Rob Bunting.  Oncologists:  Dr Alcide Evener, Dr Dorothy Puffer.   Reason for Consultation:  Nausea and vomiting in pt undergoing chemoradiation for pancreatic cancer.   HPI: [name] [name] is a 57 y.o. female   with history of alcohol abuse and reported history of chronic pancreatitis, diabetes, hypertension, history of CVA   Diagnosed with adenocarcinoma of pancreas at ERCP 05/2013.  Plastic biliary stent placed. T bili preprocedure was 4.6 with transaminases 160/290 Started Xeloda and XRT on 07/20/13. Oncology had problems contacting pt and pt had vacillated about moving to [place] with her family.  Thus the time delay in initiation of treatment. She completed her last chemoradiation on 9/22 and is to follow up with Dr Mitzi Hansen in early October.  Has had abdominal pain, mostly in epigastrium as well as nausea and vomiting that have worsened in the last 7 to 10 days.  Zofran at home helped but was ultimately unable to keep this down.   Using about 1600 mg of Ibuprofen per day. Saw occasional scant blood streaks in emesis but no coffee grounds.  No melena or BPR.   CT abdomen done 9/25 and cc'd below.   GI office as well was unable to reach pt to arrange follow up visits. Dr Christella Hartigan had wished to pursue colonoscopy but an EKG revealed "Brugada syndrome" and Dr Christella Hartigan wanted EP referral before he could pursue any colonoscopy or ERCP.  This has not happened yet.     Past Medical History  Diagnosis Date  . Diabetes mellitus without complication   . Hypertension   . Depression   . Anxiety   . Hyperlipidemia   . Pancreatitis   . GERD (gastroesophageal reflux disease)   . Pancreatic cancer     Past Surgical History  Procedure Laterality Date  . Appendectomy    . Ercp N/A  05/24/2013    Procedure: ENDOSCOPIC RETROGRADE CHOLANGIOPANCREATOGRAPHY (ERCP);  Surgeon: Rachael Fee, MD;  Location: [facility] OR;  Service: Endoscopy;  Laterality: N/A;  MAC vs. general per anesthesia   . Tubal ligation      Prior to Admission medications   Medication Sig Start Date End Date Taking? Authorizing Provider  amLODipine (NORVASC) 10 MG tablet Take 1 tablet (10 mg total) by mouth daily. 07/15/13  Yes Rana Snare, NP  gabapentin (NEURONTIN) 300 MG capsule Take 300 mg by mouth 3 (three) times daily.   Yes Historical Provider, MD  HYDROmorphone (DILAUDID) 4 MG tablet Take 0.5-1 tablets (2-4 mg total) by mouth every 4 (four) hours as needed for pain. 07/15/13  Yes Rana Snare, NP  ibuprofen (ADVIL) 200 MG tablet Take 600 mg by mouth every 6 (six) hours as needed for pain.   Yes Historical Provider, MD  insulin aspart (NOVOLOG) 100 UNIT/ML injection Inject 10 Units into the skin 3 (three) times daily with meals.   Yes Historical Provider, MD  insulin glargine (LANTUS) 100 UNIT/ML injection Inject 20 Units into the skin at bedtime.   Yes Historical Provider, MD  lisinopril (PRINIVIL,ZESTRIL) 20 MG tablet Take 1 tablet (20 mg total) by mouth daily. 07/15/13  Yes Rana Snare, NP  ondansetron (ZOFRAN-ODT) 4 MG disintegrating tablet Take 4 mg by mouth every 8 (eight) hours as needed for nausea. 07/23/13  Yes Jonna Coup, MD  polyethylene glycol [facility] / Ethelene Hal) packet Take  17 g by mouth daily as needed (constipation).   Yes Historical Provider, MD  potassium chloride (K-DUR,KLOR-CON) 10 MEQ tablet Take 10 mEq by mouth daily.   Yes Historical Provider, MD  simethicone (MYLICON) 125 MG chewable tablet Chew 125 mg by mouth every 6 (six) hours as needed for flatulence.   Yes Historical Provider, MD  simvastatin (ZOCOR) 10 MG tablet Take 10 mg by mouth at bedtime.   Yes Historical Provider, MD  capecitabine (XELODA) 500 MG tablet Take 3 tablets (1,500 mg total) by mouth 2 (two) times daily  after a meal. On days of radiation only (Monday-Friday) 3000 mg total daily dose 06/28/13   Ladene Artist, MD  hyaluronate sodium (RADIAPLEXRX) GEL Apply 1 application topically 2 (two) times daily.    Historical Provider, MD  polyethylene glycol powder (GLYCOLAX/MIRALAX) powder Take 17 g by mouth daily. Mix 17 gram powder any with 8 oz liquid 07/15/13   Rana Snare, NP    Scheduled Meds: . amLODipine  10 mg Oral Daily  . enoxaparin (LOVENOX) injection  70 mg Subcutaneous Q12H  . gabapentin  300 mg Oral TID  . [START ON 08/14/2013] influenza vac split quadrivalent PF  0.5 mL Intramuscular Tomorrow-1000  . insulin glargine  10 Units Subcutaneous QHS  . lisinopril  20 mg Oral Daily  . [START ON 08/14/2013] pneumococcal 23 valent vaccine  0.5 mL Intramuscular Tomorrow-1000  . potassium chloride  10 mEq Oral Daily  . simvastatin  10 mg Oral QHS  . sodium chloride  3 mL Intravenous Q12H   Infusions: . sodium chloride 75 mL/hr at 08/13/13 0730   PRN Meds: acetaminophen, acetaminophen, HYDROmorphone (DILAUDID) injection, ibuprofen, ondansetron (ZOFRAN) IV, ondansetron, polyethylene glycol, simethicone   Allergies as of 08/12/2013 - Review Complete 08/12/2013  Allergen Reaction Noted  . Betadine [povidone iodine] Itching and Rash 03/19/2013    Family History  Problem Relation Age of Onset  . Diabetes type II Mother   . Hypertension Mother   . Heart attack Father   . HIV/AIDS Brother     History   Social History  . Marital Status: Single    Spouse Name: N/A    Number of Children: N/A  . Years of Education: N/A   Occupational History  . Not on file.   Social History Main Topics  . Smoking status: Former Smoker -- 0.50 packs/day for 42 years    Quit date: 05/28/2013  . Smokeless tobacco: Not on file  . Alcohol Use: No  . Drug Use: No  . Sexual Activity: Yes    Birth Control/ Protection: Post-menopausal   Other Topics Concern  . Not on file   Social History Narrative   . No narrative on file    REVIEW OF SYSTEMS: Constitutional:  Weight fluctuated 149 to 151 #.   ENT:  No nose bleeds Pulm:  No cough or dyspnea CV:  No palpitatiions.  No extremity edema GU:  No blood in urine, no dysuria GI:  No dysphagia.  No heartburn.  Heme:  No recall of hx anemia.    Transfusions:  No recall of transfusions Neuro:  No headaches, no balance problems.  No blurry vision.  Derm:  Feels like she has pimples in perirectal and perivaginal regions.  Also feels dry irritation and pruritus on facial skin. Increased tearing of eyes.  Endocrine:  No excessive thirst, no sweats or chills.  Immunization:  Flu and pneumovax ordered Travel:  None.    PHYSICAL EXAM: Vital signs  in last 24 hours: Temp:  [98.8 F (37.1 C)-99.1 F (37.3 C)] 99 F (37.2 C) (09/26 0600) Pulse Rate:  [56-103] 83 (09/26 0600) Resp:  [16-20] 20 (09/26 0600) BP: (156-175)/(70-84) 171/81 mmHg (09/26 0600) SpO2:  [96 %-100 %] 100 % (09/26 0600) Weight:  [67.9 kg (149 lb 11.1 oz)] 67.9 kg (149 lb 11.1 oz) (09/26 1006)  General: AAF with pressured speech.  Speech is monotone c/w her lifelong hx of hearing impairment.  Head:  No asymmetry, no edema  Eyes:  No rash or swelling in periorbital region. +exopthalomos.  Ears:  HOH  Nose:  No discharge Mouth:  Moist, clear oral MM.   Neck:  No mass or JVD Lungs:  Clear bil.  No cough or dyspnea Heart: RRR.  No MRG Abdomen:  Soft, NT when distracted, no mass or HSM.  Active BS.   Rectal: not done   Musc/Skeltl: no joint swelling or contracture.  Extremities:  No CCE  Neurologic:  Oriented x 3.  Full limb strength.   Skin:  No rash or sores Tattoos:  None  Nodes:  No cervical adenopathy.    Psych:  Anxious, pressured and rambling speech.   Intake/Output from previous day: 09/25 0701 - 09/26 0700 In: 860 [I.V.:860] Out: 150 [Urine:150] Intake/Output this shift: Total I/O In: 1000 [I.V.:1000] Out: -   LAB RESULTS:  Recent Labs   08/12/13 1535 08/13/13 0100 08/13/13 0500  WBC 4.9 3.8* 3.6*  HGB 12.5 11.0* 11.3*  HCT 36.1 32.1* 34.1*  PLT 150 125* 128*   BMET Lab Results  Component Value Date   NA 137 08/13/2013   NA 138 08/13/2013   NA 136 08/12/2013   K 3.3* 08/13/2013   K 3.3* 08/13/2013   K 3.2* 08/12/2013   CL 102 08/13/2013   CL 102 08/13/2013   CL 100 08/12/2013   CO2 24 08/13/2013   CO2 25 08/13/2013   CO2 28 08/12/2013   GLUCOSE 158* 08/13/2013   GLUCOSE 164* 08/13/2013   GLUCOSE 176* 08/12/2013   BUN 12 08/13/2013   BUN 12 08/13/2013   BUN 14 08/12/2013   CREATININE 0.62 08/13/2013   CREATININE 0.60 08/13/2013   CREATININE 0.63 08/12/2013   CALCIUM 9.2 08/13/2013   CALCIUM 8.7 08/13/2013   CALCIUM 9.9 08/12/2013   LFT  Recent Labs  08/12/13 1535 08/13/13 0100 08/13/13 0500  PROT 8.1 6.8 7.2  ALBUMIN 3.9 3.4* 3.5  AST 16 15 16   ALT 12 11 12   ALKPHOS 135* 121* 121*  BILITOT 0.4 0.4 0.4   PT/INR Lab Results  Component Value Date   INR 1.44 08/13/2013   INR 1.44 08/13/2013   INR 0.97 05/22/2013     RADIOLOGY STUDIES: Ct Abdomen Pelvis W Contrast 08/12/2013  FINDINGS: There is stable mild left basilar atelectasis. No significant pleural or pericardial effusion is present.  Biliary stent appears well positioned. However, there is mildly increased intrahepatic biliary dilatation. No focal hepatic abnormalities are identified. There is mild nonspecific gallbladder wall thickening. The ill-defined mass involving the pancreatic body appears slightly smaller, measuring 3.6 x 2.4 cm. This is less well-defined with adjacent soft tissue stranding, possibly due to interval therapy. Pancreatic ductal dilatation in the tail is unchanged. The pancreatic mass results in venous occlusion at the SMV - splenic vein confluence. Some thrombus is present within the superior mesenteric vein (axial image 35). The portal vein remains patent.  There is new wall thickening of the mid to distal stomach and the hepatic  flexure and  proximal transverse colon. There is mild stranding in the surrounding fat. No significant ascites is demonstrated. There is no evidence of bowel obstruction.  The spleen, adrenal glands and right kidney appear stable. There is stable mild thickening of the lateral limb of the left adrenal gland. A thick-walled cyst involving the lower pole of the left kidney is unchanged, measuring 2.0 cm on image 35.  Aortoiliac atherosclerosis, sigmoid colon diverticulosis and uterine fibroids are stable. There are prominent venous structures in the pelvis, larger on the left. The urinary bladder appears normal. There are no worrisome osseous findings.  IMPRESSION: 1. The known pancreatic body mass is less well-defined, possibly due to interval therapy. This mass results in occlusion at the SMV splenic vein confluence and there is increased thrombus in the superior mesenteric vein. 2. Biliary stent remains well positioned. There is mildly increased intrahepatic biliary dilatation. 3. New wall thickening of the distal stomach, hepatic flexure and proximal transverse colon, possibly related to radiation therapy. Focal infectious colitis considered less likely. 4. Stable thick-walled cyst involving the left kidney. 5. Stable dilated venous structures in the pelvis.   Electronically Signed   By: Roxy Horseman   On: 08/12/2013 19:01    ENDOSCOPIC STUDIES: 05/24/2013  ERCP with stent placement INDICATIONS:jaundice, mild abd and back pain, weight loss; MRI shows  5cm mass in pancreatic head causing biliary obstruction. ENDOSCOPIC IMPRESSION:  4-5cm long bile duct stricture with proximal edge in proximal CBD  (almost at level of cystic duct takeoff). This was brushed for  cytology and then stented with an 8.5Fr 9cm long plastic biliary  stent. Given MRI findings, elevated CA 19-9 I suspect this  represents locally advanced pancreatic cancer.  Pathology/Cytology COMMON BILE DUCT BRUSHING: MALIGNANT CELLS PRESENT CONSISTENT WITH  ADENOCARCINOMA.  IMPRESSION: *  N/V and abdominal pain in pt who completed chemo/radiation treatment of pancreatic adenocarcinoma 9/2 - 9/22.  Cancer diagnosed 05/2013.  Thickened gastric wall and areas of colon wall on CT scan, may be due to radiation. 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  *  05/24/13 placement of plastic biliary stent. No clear evidence that this is obstructed.  *  DM 2.  Insulin requiring.  *  Agitation.   PLAN: *  Added po Protonix, stopped PRN Ibuprofen. *  Full liquid diet tonight, carb mod diet in AM. *  ? Does she need coumadin for the SMV thrombus?     LOS: 1 day   Jennye Moccasin  08/13/2013, 1:04 PM Pager: [phone]    Attending physician's note   I have taken a history, examined the patient and reviewed the chart. I agree with the Advanced Practitioner's note, impression and recommendations.  N/V and abdominal pain likely from chemoradiation therapy. Suspect radiation induced gastric wall thickening but ulcer and gastritis are also possible. PPI BID and symptomatic treatment for N/V. Transverse colon and hepatic flexure thickening likely radiation related as well. No further evaluation at this time. Biliary stent is adequately decompressiong the bile duct with LFTs near normal.  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 Defer decision on any treatment to Oncologist.   Meryl Dare, MD [facility]

## 2013-08-13 NOTE — Care Management Note (Unsigned)
    Page 1 of 1   08/13/2013     4:16:43 PM   CARE MANAGEMENT NOTE 08/13/2013  Patient:  [name] [name], [name] [name]   Account Number:  [id number]  Date Initiated:  08/13/2013  Documentation initiated by:  Caylin Nass  Subjective/Objective Assessment:   PT ADM ON 08/12/13 WITH ABD PAIN, N/V.  PTA, PT INDEPENDENT OF ADLS.     Action/Plan:   WILL FOLLOW FOR HOME NEEDS AS PT PROGRESSES.   Anticipated DC Date:  08/15/2013   Anticipated DC Plan:  HOME/SELF CARE      DC Planning Services  CM consult      Choice offered to / List presented to:             Status of service:  In process, will continue to follow Medicare Important Message given?   (If response is "NO", the following Medicare IM given date fields will be blank) Date Medicare IM given:   Date Additional Medicare IM given:    Discharge Disposition:    Per UR Regulation:  Reviewed for med. necessity/level of care/duration of stay  If discussed at Long Length of Stay Meetings, dates discussed:    Comments:

## 2013-08-14 LAB — COMPREHENSIVE METABOLIC PANEL
ALT: 11 U/L (ref 0–35)
AST: 14 U/L (ref 0–37)
Albumin: 3.3 g/dL — ABNORMAL LOW (ref 3.5–5.2)
Alkaline Phosphatase: 115 U/L (ref 39–117)
BUN: 10 mg/dL (ref 6–23)
CO2: 26 mEq/L (ref 19–32)
Calcium: 9 mg/dL (ref 8.4–10.5)
Chloride: 103 mEq/L (ref 96–112)
Creatinine, Ser: 0.6 mg/dL (ref 0.50–1.10)
GFR calc Af Amer: >90 mL/min (ref >90)
GFR calc non Af Amer: >90 mL/min (ref >90)
Glucose, Bld: 176 mg/dL — ABNORMAL HIGH (ref 70–99)
Potassium: 3.4 mEq/L — ABNORMAL LOW (ref 3.5–5.1)
Sodium: 137 mEq/L (ref 135–145)
Total Bilirubin: 0.3 mg/dL (ref 0.3–1.2)
Total Protein: 6.8 g/dL (ref 6.0–8.3)

## 2013-08-14 LAB — GLUCOSE, CAPILLARY
Glucose-Capillary: 169 mg/dL — ABNORMAL HIGH (ref 70–99)
Glucose-Capillary: 179 mg/dL — ABNORMAL HIGH (ref 70–99)

## 2013-08-14 LAB — CBC
HCT: 31.6 % — ABNORMAL LOW (ref 36.0–46.0)
Hemoglobin: 10.8 g/dL — ABNORMAL LOW (ref 12.0–15.0)
MCHC: 34.2 g/dL (ref 30.0–36.0)
MCV: 86.3 fL (ref 78.0–100.0)
RBC: 3.66 MIL/uL — ABNORMAL LOW (ref 3.87–5.11)
WBC: 4 10*3/uL (ref 4.0–10.5)

## 2013-08-14 LAB — PROTIME-INR: INR: 1.44 (ref 0.00–1.49)

## 2013-08-14 MED ORDER — PANTOPRAZOLE SODIUM 40 MG PO TBEC
40.0000 mg | DELAYED_RELEASE_TABLET | Freq: Two times a day (BID) | ORAL | Status: DC
  Administered 2013-08-14 – 2013-08-15 (×2): 40 mg via ORAL
  Filled 2013-08-14 (×2): qty 1

## 2013-08-14 MED ORDER — WARFARIN SODIUM 5 MG PO TABS
5.0000 mg | ORAL_TABLET | Freq: Once | ORAL | Status: DC
  Filled 2013-08-14: qty 1

## 2013-08-14 MED ORDER — WARFARIN - PHARMACIST DOSING INPATIENT: Freq: Every day | Status: DC

## 2013-08-14 MED ORDER — INSULIN ASPART 100 UNIT/ML ~~LOC~~ SOLN
10.0000 [IU] | Freq: Three times a day (TID) | SUBCUTANEOUS | Status: DC
  Administered 2013-08-14 – 2013-08-15 (×4): 10 [IU] via SUBCUTANEOUS

## 2013-08-14 MED ORDER — INSULIN GLARGINE 100 UNIT/ML ~~LOC~~ SOLN
20.0000 [IU] | Freq: Every day | SUBCUTANEOUS | Status: DC
  Administered 2013-08-14: 20 [IU] via SUBCUTANEOUS
  Filled 2013-08-14 (×2): qty 0.2

## 2013-08-14 NOTE — Progress Notes (Signed)
08/14/2013 2:05 PM Nursing note Pt. Voicing concerns about elevated blood glucose since resuming diet. Pt. States she takes meal coverage at home. MD on call paged and made aware of pt. CBG readings and concerns. MD to place orders. Pt. Updated on plan. Will continue to monitor.  Kenyata Napier, Blanchard Kelch

## 2013-08-14 NOTE — Progress Notes (Addendum)
CROSS COVER [id number] Subjective: Patient seems to be doing better today. The nausea and vomiting has resolved. She has been able to keep her food down; still complains of periumbilical pain. Had a BM today.  Objective: Vital signs in last 24 hours: Temp:  [98.5 F (36.9 C)-99.7 F (37.6 C)] 98.7 F (37.1 C) (09/27 1358) Pulse Rate:  [65-73] 65 (09/27 1358) Resp:  [18-20] 18 (09/27 1358) BP: (142-155)/(50-71) 142/50 mmHg (09/27 1358) SpO2:  [97 %-100 %] 100 % (09/27 1358) Last BM Date: 08/13/13  Intake/Output from previous day: 09/26 0701 - 09/27 0700 In: 1240 [P.O.:240; I.V.:1000] Out: 1250 [Urine:1250] Intake/Output this shift:   General appearance: alert, cooperative, appears stated age and no distress Resp: clear to auscultation bilaterally Cardio: regular rate and rhythm, S1, S2 normal, no murmur, click, rub or gallop GI: soft with epigastric tenderness on  Palpation with normal bowel sounds; no masses,  no organomegaly Extremities: extremities normal, atraumatic, no cyanosis or edema  Lab Results:  Recent Labs  08/13/13 0100 08/13/13 0500 08/14/13 0530  WBC 3.8* 3.6* 4.0  HGB 11.0* 11.3* 10.8*  HCT 32.1* 34.1* 31.6*  PLT 125* 128* 123*   BMET  Recent Labs  08/13/13 0100 08/13/13 0500 08/14/13 0530  NA 138 137 137  K 3.3* 3.3* 3.4*  CL 102 102 103  CO2 25 24 26   GLUCOSE 164* 158* 176*  BUN 12 12 10   CREATININE 0.60 0.62 0.60  CALCIUM 8.7 9.2 9.0   LFT  Recent Labs  08/14/13 0530  PROT 6.8  ALBUMIN 3.3*  AST 14  ALT 11  ALKPHOS 115  BILITOT 0.3   PT/INR  Recent Labs  08/13/13 0500 08/14/13 0530  LABPROT 17.2* 17.2*  INR 1.44 1.44   Studies/Results: Ct Abdomen Pelvis W Contrast  08/12/2013   CLINICAL DATA:  Nonfocal abdominal pain with nausea. History of diabetes, hypertension, pancreatitis and pancreatic cancer. Prior appendectomy.  EXAM: CT ABDOMEN AND PELVIS WITH CONTRAST  TECHNIQUE: Multidetector CT imaging of the abdomen and pelvis  was performed using the standard protocol following bolus administration of intravenous contrast.  CONTRAST:  OMNIPAQUE IOHEXOL 300 MG/ML  SOLN  COMPARISON:  Abdominal pelvic CT 05/28/2013. PET-CT 06/09/2013.  FINDINGS: There is stable mild left basilar atelectasis. No significant pleural or pericardial effusion is present.  Biliary stent appears well positioned. However, there is mildly increased intrahepatic biliary dilatation. No focal hepatic abnormalities are identified. There is mild nonspecific gallbladder wall thickening. The ill-defined mass involving the pancreatic body appears slightly smaller, measuring 3.6 x 2.4 cm. This is less well-defined with adjacent soft tissue stranding, possibly due to interval therapy. Pancreatic ductal dilatation in the tail is unchanged. The pancreatic mass results in venous occlusion at the SMV - splenic vein confluence. Some thrombus is present within the superior mesenteric vein (axial image 35). The portal vein remains patent.  There is new wall thickening of the mid to distal stomach and the hepatic flexure and proximal transverse colon. There is mild stranding in the surrounding fat. No significant ascites is demonstrated. There is no evidence of bowel obstruction.  The spleen, adrenal glands and right kidney appear stable. There is stable mild thickening of the lateral limb of the left adrenal gland. A thick-walled cyst involving the lower pole of the left kidney is unchanged, measuring 2.0 cm on image 35.  Aortoiliac atherosclerosis, sigmoid colon diverticulosis and uterine fibroids are stable. There are prominent venous structures in the pelvis, larger on the left. The urinary bladder appears  normal. There are no worrisome osseous findings.  IMPRESSION: 1. The known pancreatic body mass is less well-defined, possibly due to interval therapy. This mass results in occlusion at the SMV splenic vein confluence and there is increased thrombus in the superior  mesenteric vein. 2. Biliary stent remains well positioned. There is mildly increased intrahepatic biliary dilatation. 3. New wall thickening of the distal stomach, hepatic flexure and proximal transverse colon, possibly related to radiation therapy. Focal infectious colitis considered less likely. 4. Stable thick-walled cyst involving the left kidney. 5. Stable dilated venous structures in the pelvis.   Electronically Signed   By: Roxy Horseman   On: 08/12/2013 19:01   Medications: I have reviewed the patient's current medications.  Assessment/Plan: 1) Nausea and vomiting in a 57 year old patient with pancreatic cancer-received last chemo treatment on 09/22; SMV thombosis noted on recent CT; she has a biliary stent in place.Symptoms improved today with PPI's and Zofran. Continue present care.   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  3) DM-on insulin. 4) HTN/Dyslipidemia.  5) History of chronic pancreatitis/alcohol abuse.  6) Thickening of the distal stomach, hepatic flexure and proximal transverse colon-?radiation changes.  LOS: 2 days   Jeremyah Jelley 08/14/2013, 6:07 PM

## 2013-08-14 NOTE — Progress Notes (Signed)
[facility] PROGRESS NOTE  [name] [name] JYN:[id number] DOB: 1956-06-06 DOA: 08/12/2013 PCP: Dorrene German, MD  Assessment/Plan: 1. Adenocarcinoma of pancreas. Status post ERCP on 05/24/2013 with stent placement, procedure performed by Dr. Christella Hartigan. She began XRT and Xeloda therapy on 07/20/2013. She is currently being seen at the [facility]. Presented with complaints of abdominal pain, nausea and vomiting overnight. CT scan of abdomen on admission showing known pancreatic mass and resulting occlusion at the SMV,  biliary stents with associated mild increased intrahepatic biliary dilatation. Lab work showing stable transaminases. GI has seen patient.  2. Superior mesenteric vein thrombosis. Patient with history of pancreatic cancer, undergoing chemotherapy. Started on therapeutic Lovenox. I discussed case with Dr. Darnelle Catalan of Medical Oncology who recommended continuing Lovenox therapy if this was possible.  3. History of obstructive jaundice. Status post ERCP with stent placement in July 2014, GI consult to, will await further recommendations. 4. Hypertension. I increased lisinopril to 20 mg BID, with improvement to blood pressures. Most recent blood pressure of 144/71. 5. Insulin-dependent diabetes mellitus. Blood sugars are stable, continue insulin glargine 10 units subcutaneous daily with Accu-Cheks and sliding scale coverage. 6. Hypokalemia. Patient having a potassium of 3.3 this morning, likely secondary to diarrhea,  will administer 40 mEq of the by mouth potassium 7. Nutrition. Currently n.p.o. 8. DVT prophylaxis. On full dose Lovenox  Code Status: Full code Family Communication: Case discussed with patient at bedside Disposition Plan: Monitor for the next 24 hours, anticipate discharge soon if she remains stable.    Consultants:  GI   HPI/Subjective: Patient reports feeling better this morning with improvement to abdominal pain. She had an episode of nausea  overnight, however this morning is sitting up tolerating her breakfast. Her diet was advanced. She reports ambulating around her room, has remained hemodynamically stable, lab work showing stable liver enzymes.  Objective: Filed Vitals:   08/14/13 0507  BP: 144/71  Pulse: 65  Temp: 98.5 F (36.9 C)  Resp: 18    Intake/Output Summary (Last 24 hours) at 08/14/13 0750 Last data filed at 08/14/13 0500  Gross per 24 hour  Intake    240 ml  Output    850 ml  Net   -610 ml   Filed Weights   08/13/13 1006  Weight: 67.9 kg (149 lb 11.1 oz)    Exam:   General:  Patient is in no acute distress, she is awake alert oriented, sitting up at edge of bed having a breakfast, reports feeling better today.  Cardiovascular: 3-6 systolic ejection murmur, normal S1-S2  Respiratory: lungs are clear to auscultation bilaterally no wheezing rhonchi or rales  Abdomen: patient had a benign abdominal examination, there is no rebound tenderness or guarding, no change in her abdominal examination from yesterday  Musculoskeletal: present range of motion to all extremities   Data Reviewed: Basic Metabolic Panel:  Recent Labs Lab 08/12/13 1535 08/13/13 0100 08/13/13 0500 08/14/13 0530  NA 136 138 137 137  K 3.2* 3.3* 3.3* 3.4*  CL 100 102 102 103  CO2 28 25 24 26   GLUCOSE 176* 164* 158* 176*  BUN 14 12 12 10   CREATININE 0.63 0.60 0.62 0.60  CALCIUM 9.9 8.7 9.2 9.0  MG  --  1.6  --   --   PHOS  --  3.5  --   --    Liver Function Tests:  Recent Labs Lab 08/12/13 1535 08/13/13 0100 08/13/13 0500 08/14/13 0530  AST 16 15 16 14   ALT  12 11 12 11   ALKPHOS 135* 121* 121* 115  BILITOT 0.4 0.4 0.4 0.3  PROT 8.1 6.8 7.2 6.8  ALBUMIN 3.9 3.4* 3.5 3.3*    Recent Labs Lab 08/12/13 1535  LIPASE 9*   No results found for this basename: AMMONIA,  in the last 168 hours CBC:  Recent Labs Lab 08/12/13 1535 08/13/13 0100 08/13/13 0500 08/14/13 0530  WBC 4.9 3.8* 3.6* 4.0  NEUTROABS  3.6 2.5  --   --   HGB 12.5 11.0* 11.3* 10.8*  HCT 36.1 32.1* 34.1* 31.6*  MCV 86.0 86.3 87.0 86.3  PLT 150 125* 128* 123*   Cardiac Enzymes: No results found for this basename: CKTOTAL, CKMB, CKMBINDEX, TROPONINI,  in the last 168 hours BNP (last 3 results) No results found for this basename: PROBNP,  in the last 8760 hours CBG:  Recent Labs Lab 08/12/13 2330 08/13/13 0630 08/13/13 2103 08/14/13 0609  GLUCAP 157* 179* 320* 169*    No results found for this or any previous visit (from the past 240 hour(s)).   Studies: Ct Abdomen Pelvis W Contrast  08/12/2013   CLINICAL DATA:  Nonfocal abdominal pain with nausea. History of diabetes, hypertension, pancreatitis and pancreatic cancer. Prior appendectomy.  EXAM: CT ABDOMEN AND PELVIS WITH CONTRAST  TECHNIQUE: Multidetector CT imaging of the abdomen and pelvis was performed using the standard protocol following bolus administration of intravenous contrast.  CONTRAST:  OMNIPAQUE IOHEXOL 300 MG/ML  SOLN  COMPARISON:  Abdominal pelvic CT 05/28/2013. PET-CT 06/09/2013.  FINDINGS: There is stable mild left basilar atelectasis. No significant pleural or pericardial effusion is present.  Biliary stent appears well positioned. However, there is mildly increased intrahepatic biliary dilatation. No focal hepatic abnormalities are identified. There is mild nonspecific gallbladder wall thickening. The ill-defined mass involving the pancreatic body appears slightly smaller, measuring 3.6 x 2.4 cm. This is less well-defined with adjacent soft tissue stranding, possibly due to interval therapy. Pancreatic ductal dilatation in the tail is unchanged. The pancreatic mass results in venous occlusion at the SMV - splenic vein confluence. Some thrombus is present within the superior mesenteric vein (axial image 35). The portal vein remains patent.  There is new wall thickening of the mid to distal stomach and the hepatic flexure and proximal transverse colon.  There is mild stranding in the surrounding fat. No significant ascites is demonstrated. There is no evidence of bowel obstruction.  The spleen, adrenal glands and right kidney appear stable. There is stable mild thickening of the lateral limb of the left adrenal gland. A thick-walled cyst involving the lower pole of the left kidney is unchanged, measuring 2.0 cm on image 35.  Aortoiliac atherosclerosis, sigmoid colon diverticulosis and uterine fibroids are stable. There are prominent venous structures in the pelvis, larger on the left. The urinary bladder appears normal. There are no worrisome osseous findings.  IMPRESSION: 1. The known pancreatic body mass is less well-defined, possibly due to interval therapy. This mass results in occlusion at the SMV splenic vein confluence and there is increased thrombus in the superior mesenteric vein. 2. Biliary stent remains well positioned. There is mildly increased intrahepatic biliary dilatation. 3. New wall thickening of the distal stomach, hepatic flexure and proximal transverse colon, possibly related to radiation therapy. Focal infectious colitis considered less likely. 4. Stable thick-walled cyst involving the left kidney. 5. Stable dilated venous structures in the pelvis.   Electronically Signed   By: Roxy Horseman   On: 08/12/2013 19:01    Scheduled  Meds: . amLODipine  10 mg Oral Daily  . enoxaparin (LOVENOX) injection  70 mg Subcutaneous Q12H  . feeding supplement  237 mL Oral TID BM  . gabapentin  300 mg Oral TID  . influenza vac split quadrivalent PF  0.5 mL Intramuscular Tomorrow-1000  . insulin aspart  0-15 Units Subcutaneous TID WC  . insulin aspart  0-5 Units Subcutaneous QHS  . insulin glargine  10 Units Subcutaneous QHS  . lisinopril  20 mg Oral Daily  . pantoprazole  40 mg Oral Q0600  . pneumococcal 23 valent vaccine  0.5 mL Intramuscular Tomorrow-1000  . potassium chloride  10 mEq Oral Daily  . simvastatin  10 mg Oral QHS  . sodium  chloride  3 mL Intravenous Q12H  . warfarin   Does not apply Once  . Warfarin - Pharmacist Dosing Inpatient   Does not apply q1800   Continuous Infusions: . sodium chloride 75 mL/hr at 08/13/13 2130    Principal Problem:   Abdominal pain Active Problems:   Obstructive jaundice   DM (diabetes mellitus)   HTN (hypertension)   Dyslipidemia   Malignant neoplasm of head of pancreas   Superior mesenteric vein thrombosis   Hypokalemia   Nausea and vomiting   Nonspecific (abnormal) findings on radiological and other examination of gastrointestinal tract    Time spent: 35 minutes    Jeralyn Bennett  [facility] Pager [phone]. If 7PM-7AM, please contact night-coverage at [email or web address], password [facility] 08/14/2013, 7:50 AM  LOS: 2 days

## 2013-08-14 NOTE — Progress Notes (Addendum)
ANTICOAGULATION CONSULT NOTE - Follow-up Consult  Pharmacy Consult for Coumadin and Lovenox Indication: SMV thrombus  Allergies  Allergen Reactions  . Betadine [Povidone Iodine] Itching and Rash    Patient Measurements: Height: 5\' 3"  (160 cm) Weight: 149 lb 11.1 oz (67.9 kg) IBW/kg (Calculated) : 52.4  Vital Signs: Temp: 98.7 F (37.1 C) (09/27 1358) Temp src: Oral (09/27 1358) BP: 142/50 mmHg (09/27 1358) Pulse Rate: 65 (09/27 1358)  Labs:  Recent Labs  08/12/13 1535 08/13/13 0100 08/13/13 0500 08/14/13 0530  HGB 12.5 11.0* 11.3* 10.8*  HCT 36.1 32.1* 34.1* 31.6*  PLT 150 125* 128* 123*  APTT  --  34 30  --   LABPROT  --  17.2* 17.2* 17.2*  INR  --  1.44 1.44 1.44  CREATININE 0.63 0.60 0.62 0.60    Estimated Creatinine Clearance: 70 ml/min (by C-G formula based on Cr of 0.6).  Assessment: 57 y.o. female on Lovenox/Coumadin for SMV thrombus. This is day #2 of 5 minimum overlap. CBC remains stable. No bleeding reported. INR remains unchanged from baseline (1.44).  Will repeat 5 mg dose tonight but if no change in INR tomorrow would increase Coumadin dose.  Goal of Therapy:  INR 2-3; Anti Xa level 0.6-1.2 mcg/ml (4 hrs post dose) Monitor platelets by anticoagulation protocol: Yes   Plan:  1. Continue Lovenox 70mg  SQ q12h. 2. Coumadin 5mg  po today 3. Daily INR 4. Coumadin book and video  [employer], Pharm.D., BCPS Clinical Pharmacist Pager [phone] 08/14/2013 3:15 PM  Addendum: Noted plans to continue full dose Lovenox (no Coumadin) per Heme/Onc recommendations.  Have discontinued Coumadin orders, Rx follow-up, and INR monitoring.  [employer], Pharm.D., BCPS Clinical Pharmacist Pager [phone] 08/14/2013 3:21 PM

## 2013-08-15 ENCOUNTER — Other Ambulatory Visit: Payer: Self-pay | Admitting: Oncology

## 2013-08-15 DIAGNOSIS — R112 Nausea with vomiting, unspecified: Secondary | ICD-10-CM

## 2013-08-15 LAB — CBC
HCT: 31.6 % — ABNORMAL LOW (ref 36.0–46.0)
MCH: 29.6 pg (ref 26.0–34.0)
MCHC: 34.5 g/dL (ref 30.0–36.0)
MCV: 85.9 fL (ref 78.0–100.0)
Platelets: 120 10*3/uL — ABNORMAL LOW (ref 150–400)
RBC: 3.68 MIL/uL — ABNORMAL LOW (ref 3.87–5.11)
RDW: 15.4 % (ref 11.5–15.5)
WBC: 3.7 10*3/uL — ABNORMAL LOW (ref 4.0–10.5)

## 2013-08-15 LAB — GLUCOSE, CAPILLARY
Glucose-Capillary: 180 mg/dL — ABNORMAL HIGH (ref 70–99)
Glucose-Capillary: 176 mg/dL — ABNORMAL HIGH (ref 70–99)

## 2013-08-15 MED ORDER — ENOXAPARIN SODIUM 30 MG/0.3ML ~~LOC~~ SOLN: 70.0000 mg | Freq: Two times a day (BID) | SUBCUTANEOUS | Status: DC

## 2013-08-15 MED ORDER — PANTOPRAZOLE SODIUM 40 MG PO TBEC: 40.0000 mg | DELAYED_RELEASE_TABLET | Freq: Two times a day (BID) | ORAL | Status: DC

## 2013-08-15 MED ORDER — ENOXAPARIN SODIUM 80 MG/0.8ML ~~LOC~~ SOLN: 70.0000 mg | Freq: Two times a day (BID) | SUBCUTANEOUS | Status: DC

## 2013-08-15 NOTE — Progress Notes (Signed)
   CARE MANAGEMENT NOTE 08/15/2013  Patient:  [name] [name], [name] [name]   Account Number:  [id number]  Date Initiated:  08/13/2013  Documentation initiated by:  AMERSON,JULIE  Subjective/Objective Assessment:   PT ADM ON 08/12/13 WITH ABD PAIN, N/V.  PTA, PT INDEPENDENT OF ADLS.     Action/Plan:   WILL FOLLOW FOR HOME NEEDS AS PT PROGRESSES.   Anticipated DC Date:  08/15/2013   Anticipated DC Plan:  HOME/SELF CARE      DC Planning Services  CM consult      Choice offered to / List presented to:             Status of service:  Completed, signed off Medicare Important Message given?   (If response is "NO", the following Medicare IM given date fields will be blank) Date Medicare IM given:   Date Additional Medicare IM given:    Discharge Disposition:  HOME/SELF CARE  Per UR Regulation:  Reviewed for med. necessity/level of care/duration of stay  If discussed at Long Length of Stay Meetings, dates discussed:    Comments:  08/15/13 13:33 MD requested "test run" for lovenox for pt. Faxed prescription to Pharmacy [facility] [phone]). Pharmacist states requires pre-authorization.  Conveyed message to MD who will write for generic.  No other CM needs communicated.  Freddy Jaksch, BSN, CM [phone].

## 2013-08-15 NOTE — Progress Notes (Signed)
08/15/2013 1830 Received call from pt. Daughter after discharge that pt. Went to pharmacy to pick up rx and was told her Lovenox would cost over $100.00. Pt. Was originally informed by CM that cost would be $3.00. She also stated that the pharmacy was now closed and would not be able to take her evening dose of Lovenox and would try again in the morning to get it filled. Weekend on call CM called and detailed message left to help provide pt. Assistance. Dr. Vanessa Barbara also paged and made aware of pt. Inability to obtain Lovenox for this evening. No orders received. Jenine Krisher, Blanchard Kelch

## 2013-08-15 NOTE — Progress Notes (Signed)
08/15/2013 12:06 PM Nursing note  Pt. Viewed educational video #108 on Lovenox injections. Questions and concerns addressed.  Blayne Garlick, Blanchard Kelch

## 2013-08-15 NOTE — Progress Notes (Signed)
08/15/2013 4:06 PM Nursing note Discharge avs form, medications already taken today and those due this evening given and explained to patient and daughter. Follow up appointments and when to call MD reviewed. Pt. Did not self administer own Lovenox injection prior to discharge (MD aware of this). Pt. Did perform repeat verbalization of instructions on how to safely administer injections and pt. Daughter also educated on safe Lovenox administration and was given education on how to handle and adjust dosing on Lovenox syringe prior to discharge. (This syringe was not administered to patient, only for demonstration/educational purposes). Pt. Advised on importance of maintaining regular schedule for Lovenox injections per MD instruction. Pt. And daughter both viewed video #108 as well as given printed education on Lovenox. Questions and concerns addressed. D/c iv line. D/c tele. D/c home with family per orders.  Adaja Wander, Blanchard Kelch

## 2013-08-15 NOTE — Progress Notes (Signed)
08/15/2013 4:47 PM Nursing note Pt. Requesting to have CBG checked and insulin administered prior to d/c home. CBG checked at 231. Insulin administered per orders. Pt. Did consume 100% of dinner tray prior to discharge. Pt. AVS updated with new administrations and given back to patient. Pt. D/c home with family per orders via cab. Pt. Aware her pharmacy closes at 6pm and states she is headed straight there at discharge. Pt. Once again able to verbalize correct dosing, timing and administration of Lovenox.  Boby Eyer, Blanchard Kelch

## 2013-08-15 NOTE — Discharge Summary (Addendum)
Physician Discharge Summary  [name] [name] ZOX:[id number] DOB: 04-15-1956 DOA: 08/12/2013  PCP: Dorrene German, MD  Admit date: 08/12/2013 Discharge date: 08/15/2013  Time spent: 45 minutes  Recommendations for Outpatient Follow-up:  1. Please followup on a BMP and CBC on hospital followup appointment. She was started on Lovenox during this hospitalization.  Discharge Diagnoses:  Principal Problem:   Abdominal pain Active Problems:   Obstructive jaundice   DM (diabetes mellitus)   HTN (hypertension)   Dyslipidemia   Malignant neoplasm of head of pancreas   Superior mesenteric vein thrombosis   Hypokalemia   Nausea and vomiting   Nonspecific (abnormal) findings on radiological and other examination of gastrointestinal tract   Discharge Condition: Stable/improved  Diet recommendation: Heart healthy diet  Filed Weights   08/13/13 1006 08/15/13 0500  Weight: 67.9 kg (149 lb 11.1 oz) 66.044 kg (145 lb 9.6 oz)    History of present illness:   57 year old female with past medical history of pancreatic cancer and related obstructive jaundice, status post ERCP and biliary stent placement on 05/22/13, on chemotherapy with Xeloda and RT to pancreas (last treatment with chemotherapy and RT on 08/09/2013), further history of hypertension, diabetes and related diabetic neuropathy, anxiety and depression who presented to [facility] ED for ongoing nausea, vomiting and diffuse abdominal pain for past couple of weeks. Patient reports an ongoing non bloody vomiting and poor oral intake. Her abdominal pain is constant but relatively controlled with hydromorphone she takes at home. Patient has no associated fever or chills, cough or shortness of breath. No blood in stool or urine. No chest pain and no palpitations. No loss of consciousness.  In ED, vital are stable with BP 156/70, HR 56 -82 and T 98.8 F. Oxygen saturation was 99% on room air. CBC was unremarkable but BMP revealed mild hypokalemia of 3.3 repleted in  ED. CT abdomen revealed known pancreatic mass and resulting occlusion at the SMV, biliary stent with mildly increased intrahepatic biliary dilatation. Pt was started on Lovenox for SMV thrombosis. Pt also received 1 dose of 0.5 mg dilaudid IV for pain.   Hospital Course:  Patient with history of adenocarcinoma of pancreas, status post ERCP with stent placement, undergoing radiation and chemotherapy at the [facility], presented with complaints of abdominal pain nausea and vomiting. Initial CT showed known pancreatic mass and resultant occlusion of the SMV. Biliary stents were associated with mildly increased intrahepatic biliary dilatation. She was also found to have a superior mesenteric vein thrombosis. She was admitted to medicine service, provided with IV fluids and supportive care. I consulted gastroenterology, as patient was evaluated by Dr. Russella Dar. Symptoms were attributed to effects of chemotherapy and radiation therapy, which he had been receiving this month. It's possible that radiation-induced gastric wall thickening was the cause of her symptoms although gastritis and peptic ulcer disease were possible. GI recommending protonix 40 mg by mouth twice a day. Imaging studies showing transverse colon hepatic flexure thickening as well likely related to radiation therapy. Lab work showed liver function tests within normal limits. With regard to SMV thrombosis, I discussed case with Dr Darnelle Catalan of medical oncology who recommended continuing Lovenox therapy. This would be optimal since she is undergoing chemotherapy. Patient overall showed clinical improvement throughout this hospital course, tolerating by mouth intake with advancement of her diet. On the day of discharge I consulted case manager to assist patient with obtaining Lovenox subcutaneous at home. Nursing staff providing education and instruction on the administration of Lovenox.  She was discharged in stable condition on  08/16/2003.  Consultations:  Gastroenterology  Discharge Exam: Filed Vitals:   08/15/13 0444  BP: 133/70  Pulse: 73  Temp: 98.8 F (37.1 C)  Resp: 18    General: Patient reports doing much better, she is sitting up at, tolerating her lunch. Has no complaints. Cardiovascular: Regular rate and rhythm normal S1-S2 Respiratory: Lungs are clear to auscultation bilaterally Abdomen: Soft nontender nondistended positive bowel sounds in upper quad. Overall significant improvement to abdominal examination since admission. Extremity: No cyanosis clubbing or  Discharge Instructions  Discharge Orders   Future Appointments Provider Department Dept Phone   08/17/2013 2:45 PM Sherrie Mustache [facility] MEDICAL ONCOLOGY [phone]   08/17/2013 3:15 PM Rana Snare, NP [facility] MEDICAL ONCOLOGY [phone]   08/27/2013 1:45 PM Rachael Fee, MD [facility] Gastroenterology [phone]   09/16/2013 11:15 AM Jonna Coup, MD [facility] RADIATION ONCOLOGY [phone]   Future Orders Complete By Expires   Diet - low sodium heart healthy  As directed    Discharge instructions  As directed    Comments:     Please remember to take your new medication Lovenox twice a day   Increase activity slowly  As directed        Medication List         ADVIL 200 MG tablet  Generic drug:  ibuprofen  Take 600 mg by mouth every 6 (six) hours as needed for pain.     amLODipine 10 MG tablet  Commonly known as:  NORVASC  Take 1 tablet (10 mg total) by mouth daily.     capecitabine 500 MG tablet  Commonly known as:  XELODA  - Take 3 tablets (1,500 mg total) by mouth 2 (two) times daily after a meal. On days of radiation only (Monday-Friday)  - 3000 mg total daily dose     enoxaparin 80 MG/0.8ML injection  Commonly known as:  LOVENOX  Inject 0.7 mLs (70 mg total) into the skin every 12 (twelve) hours.     gabapentin 300 MG capsule   Commonly known as:  NEURONTIN  Take 300 mg by mouth 3 (three) times daily.     hyaluronate sodium Gel  Apply 1 application topically 2 (two) times daily.     HYDROmorphone 4 MG tablet  Commonly known as:  DILAUDID  Take 0.5-1 tablets (2-4 mg total) by mouth every 4 (four) hours as needed for pain.     insulin aspart 100 UNIT/ML injection  Commonly known as:  novoLOG  Inject 10 Units into the skin 3 (three) times daily with meals.     insulin glargine 100 UNIT/ML injection  Commonly known as:  LANTUS  Inject 20 Units into the skin at bedtime.     lisinopril 20 MG tablet  Commonly known as:  PRINIVIL,ZESTRIL  Take 1 tablet (20 mg total) by mouth daily.     ondansetron 4 MG disintegrating tablet  Commonly known as:  ZOFRAN-ODT  Take 4 mg by mouth every 8 (eight) hours as needed for nausea.     pantoprazole 40 MG tablet  Commonly known as:  PROTONIX  Take 1 tablet (40 mg total) by mouth 2 (two) times daily.     polyethylene glycol packet  Commonly known as:  MIRALAX / GLYCOLAX  Take 17 g by mouth daily as needed (constipation).     potassium chloride 10 MEQ tablet  Commonly known as:  K-DUR,KLOR-CON  Take 10 mEq by mouth daily.     simethicone 125 MG chewable tablet  Commonly known as:  MYLICON  Chew 125 mg by mouth every 6 (six) hours as needed for flatulence.     simvastatin 10 MG tablet  Commonly known as:  ZOCOR  Take 10 mg by mouth at bedtime.       Allergies  Allergen Reactions  . Betadine [Povidone Iodine] Itching and Rash       Follow-up Information   Follow up with AVBUERE,EDWIN A, MD In 1 week.   Specialty:  Internal Medicine   Contact information:   [address] [place] [address] [phone]       Follow up with Jonna Coup, MD In 1 week.   Specialty:  Radiation Oncology   Contact information:   [address] [place] [address] [phone]       Follow up with Rachael Fee, MD In 2 weeks.   Specialty:   Gastroenterology   Contact information:   [address] [place] [address] [phone]        The results of significant diagnostics from this hospitalization (including imaging, microbiology, ancillary and laboratory) are listed below for reference.    Significant Diagnostic Studies: Ct Abdomen Pelvis W Contrast  08/12/2013   CLINICAL DATA:  Nonfocal abdominal pain with nausea. History of diabetes, hypertension, pancreatitis and pancreatic cancer. Prior appendectomy.  EXAM: CT ABDOMEN AND PELVIS WITH CONTRAST  TECHNIQUE: Multidetector CT imaging of the abdomen and pelvis was performed using the standard protocol following bolus administration of intravenous contrast.  CONTRAST:  OMNIPAQUE IOHEXOL 300 MG/ML  SOLN  COMPARISON:  Abdominal pelvic CT 05/28/2013. PET-CT 06/09/2013.  FINDINGS: There is stable mild left basilar atelectasis. No significant pleural or pericardial effusion is present.  Biliary stent appears well positioned. However, there is mildly increased intrahepatic biliary dilatation. No focal hepatic abnormalities are identified. There is mild nonspecific gallbladder wall thickening. The ill-defined mass involving the pancreatic body appears slightly smaller, measuring 3.6 x 2.4 cm. This is less well-defined with adjacent soft tissue stranding, possibly due to interval therapy. Pancreatic ductal dilatation in the tail is unchanged. The pancreatic mass results in venous occlusion at the SMV - splenic vein confluence. Some thrombus is present within the superior mesenteric vein (axial image 35). The portal vein remains patent.  There is new wall thickening of the mid to distal stomach and the hepatic flexure and proximal transverse colon. There is mild stranding in the surrounding fat. No significant ascites is demonstrated. There is no evidence of bowel obstruction.  The spleen, adrenal glands and right kidney appear stable. There is stable mild thickening of the lateral limb  of the left adrenal gland. A thick-walled cyst involving the lower pole of the left kidney is unchanged, measuring 2.0 cm on image 35.  Aortoiliac atherosclerosis, sigmoid colon diverticulosis and uterine fibroids are stable. There are prominent venous structures in the pelvis, larger on the left. The urinary bladder appears normal. There are no worrisome osseous findings.  IMPRESSION: 1. The known pancreatic body mass is less well-defined, possibly due to interval therapy. This mass results in occlusion at the SMV splenic vein confluence and there is increased thrombus in the superior mesenteric vein. 2. Biliary stent remains well positioned. There is mildly increased intrahepatic biliary dilatation. 3. New wall thickening of the distal stomach, hepatic flexure and proximal transverse colon, possibly related to radiation therapy. Focal infectious colitis considered less likely. 4.  Stable thick-walled cyst involving the left kidney. 5. Stable dilated venous structures in the pelvis.   Electronically Signed   By: Roxy Horseman   On: 08/12/2013 19:01    Microbiology: No results found for this or any previous visit (from the past 240 hour(s)).   Labs: Basic Metabolic Panel:  Recent Labs Lab 08/12/13 1535 08/13/13 0100 08/13/13 0500 08/14/13 0530  NA 136 138 137 137  K 3.2* 3.3* 3.3* 3.4*  CL 100 102 102 103  CO2 28 25 24 26   GLUCOSE 176* 164* 158* 176*  BUN 14 12 12 10   CREATININE 0.63 0.60 0.62 0.60  CALCIUM 9.9 8.7 9.2 9.0  MG  --  1.6  --   --   PHOS  --  3.5  --   --    Liver Function Tests:  Recent Labs Lab 08/12/13 1535 08/13/13 0100 08/13/13 0500 08/14/13 0530  AST 16 15 16 14   ALT 12 11 12 11   ALKPHOS 135* 121* 121* 115  BILITOT 0.4 0.4 0.4 0.3  PROT 8.1 6.8 7.2 6.8  ALBUMIN 3.9 3.4* 3.5 3.3*    Recent Labs Lab 08/12/13 1535  LIPASE 9*   No results found for this basename: AMMONIA,  in the last 168 hours CBC:  Recent Labs Lab 08/12/13 1535 08/13/13 0100  08/13/13 0500 08/14/13 0530 08/15/13 0440  WBC 4.9 3.8* 3.6* 4.0 3.7*  NEUTROABS 3.6 2.5  --   --   --   HGB 12.5 11.0* 11.3* 10.8* 10.9*  HCT 36.1 32.1* 34.1* 31.6* 31.6*  MCV 86.0 86.3 87.0 86.3 85.9  PLT 150 125* 128* 123* 120*   Cardiac Enzymes: No results found for this basename: CKTOTAL, CKMB, CKMBINDEX, TROPONINI,  in the last 168 hours BNP: BNP (last 3 results) No results found for this basename: PROBNP,  in the last 8760 hours CBG:  Recent Labs Lab 08/14/13 1401 08/14/13 1621 08/14/13 2101 08/15/13 0615 08/15/13 1129  GLUCAP 203* 187* 179* 180* 176*       Signed:  Bryanda Mikel  [facility] 08/15/2013, 11:56 AM

## 2013-08-16 ENCOUNTER — Ambulatory Visit: Payer: Medicaid Other

## 2013-08-16 LAB — GLUCOSE, CAPILLARY: Glucose-Capillary: 231 mg/dL — ABNORMAL HIGH (ref 70–99)

## 2013-08-17 ENCOUNTER — Ambulatory Visit: Payer: Medicaid Other

## 2013-08-17 ENCOUNTER — Ambulatory Visit (HOSPITAL_BASED_OUTPATIENT_CLINIC_OR_DEPARTMENT_OTHER): Payer: Medicaid Other | Admitting: Nurse Practitioner

## 2013-08-17 ENCOUNTER — Other Ambulatory Visit (HOSPITAL_BASED_OUTPATIENT_CLINIC_OR_DEPARTMENT_OTHER): Payer: Medicaid Other | Admitting: Lab

## 2013-08-17 ENCOUNTER — Telehealth: Payer: Self-pay | Admitting: Oncology

## 2013-08-17 VITALS — BP 138/73 | HR 67 | Temp 98.1°F | Resp 18 | Ht 63.0 in | Wt 147.3 lb

## 2013-08-17 DIAGNOSIS — C250 Malignant neoplasm of head of pancreas: Secondary | ICD-10-CM

## 2013-08-17 DIAGNOSIS — C259 Malignant neoplasm of pancreas, unspecified: Secondary | ICD-10-CM

## 2013-08-17 DIAGNOSIS — I82890 Acute embolism and thrombosis of other specified veins: Secondary | ICD-10-CM

## 2013-08-17 DIAGNOSIS — G893 Neoplasm related pain (acute) (chronic): Secondary | ICD-10-CM

## 2013-08-17 DIAGNOSIS — Z7901 Long term (current) use of anticoagulants: Secondary | ICD-10-CM

## 2013-08-17 LAB — CBC WITH DIFFERENTIAL/PLATELET
BASO%: 0.5 % (ref 0.0–2.0)
EOS%: 12.2 % — ABNORMAL HIGH (ref 0.0–7.0)
Eosinophils Absolute: 0.5 10*3/uL (ref 0.0–0.5)
HCT: 34.8 % (ref 34.8–46.6)
MCHC: 33.4 g/dL (ref 31.5–36.0)
MONO#: 0.7 10*3/uL (ref 0.1–0.9)
NEUT#: 2.7 10*3/uL (ref 1.5–6.5)
Platelets: 116 10*3/uL — ABNORMAL LOW (ref 145–400)
RBC: 3.93 10*6/uL (ref 3.70–5.45)
RDW: 15.6 % — ABNORMAL HIGH (ref 11.2–14.5)
WBC: 4.4 10*3/uL (ref 3.9–10.3)
lymph#: 0.4 10*3/uL — ABNORMAL LOW (ref 0.9–3.3)

## 2013-08-17 LAB — BASIC METABOLIC PANEL (CC13)
CO2: 25 mEq/L (ref 22–29)
Chloride: 105 mEq/L (ref 98–109)
Creatinine: 0.8 mg/dL (ref 0.6–1.1)
Glucose: 190 mg/dl — ABNORMAL HIGH (ref 70–140)
Potassium: 3.7 mEq/L (ref 3.5–5.1)
Sodium: 139 mEq/L (ref 136–145)

## 2013-08-17 MED ORDER — TRAMADOL HCL 50 MG PO TABS: 50.0000 mg | ORAL_TABLET | Freq: Four times a day (QID) | ORAL | Status: DC | PRN

## 2013-08-17 NOTE — Progress Notes (Addendum)
OFFICE PROGRESS NOTE  Interval history:  [name] [name] returns as scheduled. She completed radiation and Xeloda on 08/09/2013. She was hospitalized on 08/12/2013 with nausea/vomiting and abdominal pain. Abdominal CT showed the pancreatic body mass was less well-defined; the mass resulted in occlusion at the SMV splenic vein confluence and there was increased thrombus in the superior mesenteric vein; biliary stent was well positioned; there was mildly increased intrahepatic biliary dilatation; there was new wall thickening of the distal stomach, hepatic flexure and proximal transverse colon.  She was started on Lovenox for the SMV thrombosis. She was seen by Dr. Russella Dar, gastroenterology. The nausea/vomiting abdominal pain were felt to likely be from chemoradiation therapy. Radiation-induced gastric wall thickening was suspected. Ulcer and gastritis also felt possible. Transverse colon and hepatic flexure thickening likely radiation related as well with no further evaluation recommended at this time. Biliary stent was adequately decompressing the bile duct with LFTs near normal. SMV thrombosis felt likely tumor related.  She was discharged home on 08/15/2013.  The nausea/vomiting and abdominal pain are better. She would like a new prescription for Ultram for as needed use for the abdominal pain. Bowels are moving. She denies any bleeding. She notes a bruise at the left abdominal wall near a recent Lovenox injection. She denies any mouth sores. No hand or foot pain or redness. She reports the skin over her face is dark. Oral intake is improving.   Objective: Blood pressure 138/73, pulse 67, temperature 98.1 F (36.7 C), temperature source Oral, resp. rate 18, height 5\' 3"  (1.6 m), weight 147 lb 4.8 oz (66.815 kg).  Oropharynx is without thrush or ulceration. Lungs are clear. Regular cardiac rhythm. No murmur. Abdomen soft and nontender. No organomegaly. No mass. Large ecchymosis at the left abdominal wall.  Extremities are without edema. Calves are soft and nontender. Palms are without erythema. Palms with scattered areas of hyperpigmentation. Face also with scattered areas of hyperpigmentation.  Lab Results: Lab Results  Component Value Date   WBC 4.4 08/17/2013   HGB 11.6 08/17/2013   HCT 34.8 08/17/2013   MCV 88.5 08/17/2013   PLT 116* 08/17/2013    Chemistry:    Chemistry      Component Value Date/Time   NA 139 08/17/2013 1451   NA 137 08/14/2013 0530   K 3.7 08/17/2013 1451   K 3.4* 08/14/2013 0530   CL 103 08/14/2013 0530   CO2 25 08/17/2013 1451   CO2 26 08/14/2013 0530   BUN 11.9 08/17/2013 1451   BUN 10 08/14/2013 0530   CREATININE 0.8 08/17/2013 1451   CREATININE 0.60 08/14/2013 0530      Component Value Date/Time   CALCIUM 9.6 08/17/2013 1451   CALCIUM 9.0 08/14/2013 0530   ALKPHOS 115 08/14/2013 0530   ALKPHOS 172* 08/03/2013 1543   AST 14 08/14/2013 0530   AST 16 08/03/2013 1543   ALT 11 08/14/2013 0530   ALT 35 08/03/2013 1543   BILITOT 0.3 08/14/2013 0530   BILITOT 0.56 08/03/2013 1543       Studies/Results: Ct Abdomen Pelvis W Contrast  08/12/2013   CLINICAL DATA:  Nonfocal abdominal pain with nausea. History of diabetes, hypertension, pancreatitis and pancreatic cancer. Prior appendectomy.  EXAM: CT ABDOMEN AND PELVIS WITH CONTRAST  TECHNIQUE: Multidetector CT imaging of the abdomen and pelvis was performed using the standard protocol following bolus administration of intravenous contrast.  CONTRAST:  OMNIPAQUE IOHEXOL 300 MG/ML  SOLN  COMPARISON:  Abdominal pelvic CT 05/28/2013. PET-CT 06/09/2013.  FINDINGS: There  is stable mild left basilar atelectasis. No significant pleural or pericardial effusion is present.  Biliary stent appears well positioned. However, there is mildly increased intrahepatic biliary dilatation. No focal hepatic abnormalities are identified. There is mild nonspecific gallbladder wall thickening. The ill-defined mass involving the pancreatic body appears  slightly smaller, measuring 3.6 x 2.4 cm. This is less well-defined with adjacent soft tissue stranding, possibly due to interval therapy. Pancreatic ductal dilatation in the tail is unchanged. The pancreatic mass results in venous occlusion at the SMV - splenic vein confluence. Some thrombus is present within the superior mesenteric vein (axial image 35). The portal vein remains patent.  There is new wall thickening of the mid to distal stomach and the hepatic flexure and proximal transverse colon. There is mild stranding in the surrounding fat. No significant ascites is demonstrated. There is no evidence of bowel obstruction.  The spleen, adrenal glands and right kidney appear stable. There is stable mild thickening of the lateral limb of the left adrenal gland. A thick-walled cyst involving the lower pole of the left kidney is unchanged, measuring 2.0 cm on image 35.  Aortoiliac atherosclerosis, sigmoid colon diverticulosis and uterine fibroids are stable. There are prominent venous structures in the pelvis, larger on the left. The urinary bladder appears normal. There are no worrisome osseous findings.  IMPRESSION: 1. The known pancreatic body mass is less well-defined, possibly due to interval therapy. This mass results in occlusion at the SMV splenic vein confluence and there is increased thrombus in the superior mesenteric vein. 2. Biliary stent remains well positioned. There is mildly increased intrahepatic biliary dilatation. 3. New wall thickening of the distal stomach, hepatic flexure and proximal transverse colon, possibly related to radiation therapy. Focal infectious colitis considered less likely. 4. Stable thick-walled cyst involving the left kidney. 5. Stable dilated venous structures in the pelvis.   Electronically Signed   By: Roxy Horseman   On: 08/12/2013 19:01    Medications: I have reviewed the patient's current medications.  Assessment/Plan:  1. Adenocarcinoma of the pancreas presenting  with an obstructing pancreas head mass, clinical stage II versus III (T3-T4, N1); potential abutment of the celiac axis noted on the MRI abdomen 05/22/2013. Initiation of radiation and Xeloda 07/20/2013 with completion 08/09/2013. 2. Obstructive jaundice secondary to #1. Status post placement of a bile duct stent on 05/24/2013. 3. Pain secondary to pancreas cancer. Controlled with hydromorphone. 4. Diabetes. 5. History of chronic pancreatitis. 6. History of alcohol use. 7. History of tobacco use. 8. Anxiety/depression. 9. Cutaneous nodule at the left flank. Question cyst, question metastasis. 10. Abnormal EKG, question Brugada syndrome. 11. Hospitalization 08/12/2013 through 08/15/2013 with nausea/vomiting and abdominal pain. Improved. 12. 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 Portal vein patent. New wall thickening of the mid to distal stomach and the hepatic flexure and proximal transverse colon. 13. 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  Disposition-she has completed the course of Xeloda and radiation therapy. She will return for a followup visit and repeat CA 19-9 in one month.  She was recently hospitalized with nausea/vomiting and increased abdominal pain. CT showed SMV thrombus. She is now on Lovenox anticoagulation. The abdominal pain is better. It is unclear if the abdominal pain is related to the SMV thrombus versus another etiology. She will continue Lovenox for now.  Dr. Truett Perna will review the CT scan with a radiologist. Of note, we discussed changing the Lovenox to 1.5 mg per kilogram daily. She prefers to remain on the twice daily dosing schedule.  She requested a new prescription for Ultram. This was provided to her at today's  visit.  She will contact the office prior to her next visit with any problems.  Patient seen with Dr. Truett Perna.     Lonna Cobb ANP/GNP-BC    Approximately 25 minutes spent face-to-face with the patient with the majority of that time involving counseling/coordination of care.  This was assured visit with Lonna Cobb. [name] [name] was interviewed and examined. The abdominal pain prompting hospital admission on 08/12/2013 was most likely related to toxicity from the Xeloda/radiation as opposed to an acute thrombotic event. I will review the CT scan and present her case at the GI tumor conference prior to deciding on the indication for long-term anticoagulation. She will remain on Lovenox for now.  Mancel Bale, M.D.  Addendum: I reviewed the 08/12/2013 CT with a radiologist. There is clot in the superior mesenteric vein. This was not present on the CT from July of 2014. However the portal vein appeared to disappear in the tumor on the July CT. On the 08/12/2013 CT there is new edema in the right colon which could potentially be related to the superior mesenteric vein thrombosis.  Her case will be presented at the GI tumor conference on 09/01/2013 for further discussion. The plan is to continue Lovenox anticoagulation for now.  Mancel Bale, M.D.

## 2013-08-17 NOTE — Telephone Encounter (Signed)
GAve pt Appt for lab and MD on October 2014

## 2013-08-18 ENCOUNTER — Ambulatory Visit: Payer: Medicaid Other

## 2013-08-18 NOTE — Progress Notes (Signed)
  Radiation Oncology         [phone] ________________________________  Name: [name] [name] MRN: [id number]  Date: 08/09/2013  DOB: Apr 21, 1956  End of Treatment Note  Diagnosis:   Metastatic pancreatic cancer     Indication for treatment:  Palliative       Radiation treatment dates:   07/20/2013 through 08/09/2013  Site/dose:   The patient was treated to the tumor within the pancreas and surrounding high-risk region. The patient was treated to 37.5 gray at 2.5 gray per fraction. The patient's treatment consisted of a IMRT technique on ourtomotherapy unit.  Narrative: The patient tolerated radiation treatment relatively well.   The patient had some ongoing abdominal pain during treatment as well as some occasional nausea and decreased appetite.  Plan: The patient has completed radiation treatment. The patient will return to radiation oncology clinic for routine followup in one month. I advised the patient to call or return sooner if they have any questions or concerns related to their recovery or treatment. ________________________________  Radene Gunning, M.D., Ph.D.

## 2013-08-19 ENCOUNTER — Ambulatory Visit: Payer: Medicaid Other

## 2013-08-24 ENCOUNTER — Telehealth: Payer: Self-pay | Admitting: *Deleted

## 2013-08-24 NOTE — Telephone Encounter (Signed)
Call from [place] with [employer] to confirm pt has completed Xeloda treatment. Treatment is complete. She will discontinue pt's Xeloda Rx.

## 2013-08-27 ENCOUNTER — Ambulatory Visit: Payer: Medicaid Other | Admitting: Gastroenterology

## 2013-09-10 ENCOUNTER — Encounter: Payer: Self-pay | Admitting: Radiation Oncology

## 2013-09-12 ENCOUNTER — Other Ambulatory Visit: Payer: Self-pay | Admitting: Oncology

## 2013-09-13 ENCOUNTER — Ambulatory Visit: Payer: Medicaid Other | Admitting: Oncology

## 2013-09-13 ENCOUNTER — Other Ambulatory Visit: Payer: Medicaid Other | Admitting: Lab

## 2013-09-16 ENCOUNTER — Telehealth: Payer: Self-pay | Admitting: *Deleted

## 2013-09-16 ENCOUNTER — Ambulatory Visit: Admission: RE | Admit: 2013-09-16 | Payer: Medicaid Other | Source: Ambulatory Visit | Admitting: Radiation Oncology

## 2013-09-16 HISTORY — DX: Personal history of irradiation: Z92.3

## 2013-09-16 NOTE — Telephone Encounter (Signed)
called patient [phone] phone, left message, if on her way for f/u appt great, her scheduled time was 11:15am, it is now 11:40, if not she needs to call to reschedule MD out of clinic at noon 11:41 AM

## 2013-09-17 ENCOUNTER — Telehealth: Payer: Self-pay | Admitting: *Deleted

## 2013-09-17 NOTE — Telephone Encounter (Signed)
Left VM for patient to call back to reschedule follow up appointment to discuss her continued care needs and anti-coagulation needs.

## 2013-09-24 ENCOUNTER — Other Ambulatory Visit (INDEPENDENT_AMBULATORY_CARE_PROVIDER_SITE_OTHER): Payer: Medicaid Other

## 2013-09-24 ENCOUNTER — Encounter: Payer: Self-pay | Admitting: Gastroenterology

## 2013-09-24 ENCOUNTER — Ambulatory Visit (INDEPENDENT_AMBULATORY_CARE_PROVIDER_SITE_OTHER): Payer: Medicaid Other | Admitting: Gastroenterology

## 2013-09-24 VITALS — BP 122/74 | HR 72 | Ht 62.25 in | Wt 149.1 lb

## 2013-09-24 DIAGNOSIS — C259 Malignant neoplasm of pancreas, unspecified: Secondary | ICD-10-CM

## 2013-09-24 LAB — COMPREHENSIVE METABOLIC PANEL
ALT: 25 U/L (ref 0–35)
AST: 27 U/L (ref 0–37)
Albumin: 4.3 g/dL (ref 3.5–5.2)
Alkaline Phosphatase: 135 U/L — ABNORMAL HIGH (ref 39–117)
BUN: 23 mg/dL (ref 6–23)
CO2: 27 mEq/L (ref 19–32)
Calcium: 9.9 mg/dL (ref 8.4–10.5)
Chloride: 103 mEq/L (ref 96–112)
Creatinine, Ser: 0.8 mg/dL (ref 0.4–1.2)
Glucose, Bld: 162 mg/dL — ABNORMAL HIGH (ref 70–99)
Potassium: 4 mEq/L (ref 3.5–5.1)
Sodium: 138 mEq/L (ref 135–145)
Total Protein: 8.9 g/dL — ABNORMAL HIGH (ref 6.0–8.3)

## 2013-09-24 LAB — CBC WITH DIFFERENTIAL/PLATELET
Basophils Absolute: 0 10*3/uL (ref 0.0–0.1)
Basophils Relative: 0.3 % (ref 0.0–3.0)
Eosinophils Absolute: 0.3 10*3/uL (ref 0.0–0.7)
Eosinophils Relative: 7 % — ABNORMAL HIGH (ref 0.0–5.0)
HCT: 39 % (ref 36.0–46.0)
Hemoglobin: 13.2 g/dL (ref 12.0–15.0)
Lymphocytes Relative: 14.2 % (ref 12.0–46.0)
MCHC: 33.9 g/dL (ref 30.0–36.0)
MCV: 88.1 fl (ref 78.0–100.0)
Monocytes Absolute: 0.4 10*3/uL (ref 0.1–1.0)
Neutrophils Relative %: 70 % (ref 43.0–77.0)
Platelets: 171 10*3/uL (ref 150.0–400.0)
RBC: 4.42 Mil/uL (ref 3.87–5.11)
RDW: 16.5 % — ABNORMAL HIGH (ref 11.5–14.6)
WBC: 4.4 10*3/uL — ABNORMAL LOW (ref 4.5–10.5)

## 2013-09-24 LAB — PROTIME-INR: INR: 1.1 ratio — ABNORMAL HIGH (ref 0.8–1.0)

## 2013-09-24 MED ORDER — OXYCODONE HCL ER 10 MG PO T12A: 10.0000 mg | EXTENDED_RELEASE_TABLET | Freq: Two times a day (BID) | ORAL | Status: DC

## 2013-09-24 NOTE — Progress Notes (Signed)
HPI: This is a   very pleasant 57-year-old woman who is here with her daughter today. I last saw her when she was hospitalized this past July for newly diagnosed pancreatic adenocarcinoma. Perform an ERCP and placed a 9 cm long plastic stent across a 4 cm distal biliary stricture. Brushings during that procedure confirmed adenocarcinoma. Imaging has suggested significant vascular involvement. 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 The celiac trunk may also be involved on imaging as well. She has undergone neoadjuvant chemoradiation. She said she's feeling fairly well, she does have some intermittent abdominal pains but is not even require narcotic pain medicines. Last liver tests were about a month ago and they were nearly normal.  Past Medical History  Diagnosis Date  . DM (diabetes mellitus), type 2     requires insulin  . Hypertension   . Depression   . Anxiety   . Hyperlipidemia   . Pancreatitis   . GERD (gastroesophageal reflux disease)   . Pancreatic cancer 05/2013    adenocarcinoma on ERCP/FNA  . Hearing impaired     lost the last of her hearing aids and cn not get another one.  hearing is better via right ear.   . History of radiation therapy 07/20/13-08/09/13    Pancreas 37.5Gy    Past Surgical History  Procedure Laterality Date  . Appendectomy    . Ercp N/A 05/24/2013    Procedure: ENDOSCOPIC RETROGRADE CHOLANGIOPANCREATOGRAPHY (ERCP);  Surgeon: Daniel P Jacobs, MD;  Location: [facility] OR;  Service: Endoscopy;  Laterality: N/A;  MAC vs. general per anesthesia   . Tubal ligation      Current Outpatient Prescriptions  Medication Sig Dispense Refill  . amLODipine (NORVASC) 10 MG tablet Take 1 tablet (10 mg total) by mouth daily.  30 tablet  0   . gabapentin (NEURONTIN) 300 MG capsule Take 300 mg by mouth 3 (three) times daily.      . hyaluronate sodium (RADIAPLEXRX) GEL Apply 1 application topically 2 (two) times daily.      . HYDROmorphone (DILAUDID) 4 MG tablet Take 0.5-1 tablets (2-4 mg total) by mouth every 4 (four) hours as needed for pain.  40 tablet  0  . ibuprofen (ADVIL) 200 MG tablet Take 600 mg by mouth every 6 (six) hours as needed for pain.      . insulin aspart (NOVOLOG) 100 UNIT/ML injection Inject 10 Units into the skin 3 (three) times daily with meals.      . insulin glargine (LANTUS) 100 UNIT/ML injection Inject 20 Units into the skin at bedtime.      . lisinopril (PRINIVIL,ZESTRIL) 20 MG tablet Take 1 tablet (20 mg total) by mouth daily.  30 tablet  0  . ondansetron (ZOFRAN-ODT) 4 MG disintegrating tablet Take 4 mg by mouth every 8 (eight) hours as needed for nausea.      . pantoprazole (PROTONIX) 40 MG tablet Take 1 tablet (40 mg total) by mouth 2 (two) times daily.  60 tablet  1  . polyethylene glycol (MIRALAX / GLYCOLAX) packet Take 17 g by mouth daily as needed (constipation).      . potassium chloride (K-DUR,KLOR-CON) 10 MEQ tablet Take 10 mEq by mouth daily.      . simethicone (MYLICON)   125 MG chewable tablet Chew 125 mg by mouth every 6 (six) hours as needed for flatulence.      . simvastatin (ZOCOR) 10 MG tablet Take 10 mg by mouth at bedtime.      . traMADol (ULTRAM) 50 MG tablet Take 1 tablet (50 mg total) by mouth every 6 (six) hours as needed for pain.  30 tablet  0  . OxyCODONE (OXYCONTIN) 10 mg T12A 12 hr tablet Take 1 tablet (10 mg total) by mouth every 12 (twelve) hours.  60 tablet  0  . [DISCONTINUED] enoxaparin (LOVENOX) 80 MG/0.8ML injection Inject 0.7 mLs (70 mg total) into the skin every 12 (twelve) hours.  60 Syringe  1   No current facility-administered medications for this visit.    Allergies as of 09/24/2013 - Review Complete 09/24/2013  Allergen Reaction Noted  . Betadine [povidone  iodine] Itching and Rash 03/19/2013    Family History  Problem Relation Age of Onset  . Diabetes type II Mother   . Hypertension Mother   . Heart attack Father   . HIV/AIDS Brother     History   Social History  . Marital Status: Single    Spouse Name: N/A    Number of Children: 5  . Years of Education: N/A   Occupational History  . Not on file.   Social History Main Topics  . Smoking status: Former Smoker -- 0.50 packs/day for 42 years    Quit date: 05/28/2013  . Smokeless tobacco: Never Used  . Alcohol Use: No  . Drug Use: No  . Sexual Activity: Yes    Birth Control/ Protection: Post-menopausal   Other Topics Concern  . Not on file   Social History Narrative  . No narrative on file      Physical Exam: BP 122/74  Pulse 72  Ht 5' 2.25" (1.581 m)  Wt 149 lb 2 oz (67.643 kg)  BMI 27.06 kg/m2 Constitutional: generally well-appearing Psychiatric: alert and oriented x3 Abdomen: soft, nontender, nondistended, no obvious ascites, no peritoneal signs, normal bowel sounds     Assessment and plan: 57 y.o. female with  unresectable pancreatic adenocarcinoma  she has had plastic biliary stent in place for about 4 months now. I'm going to plan to remove this and replace it with a longer-term metal biliary stent. She does have some intermittent abdominal pains was asking for better pain medicine and Advil. I'm giving her a prescription for OxyContin 12 hour period 60 pills with 3 refills. She also get basic set of labs including CBC, complete metabolic profile and coags.  

## 2013-09-24 NOTE — Patient Instructions (Addendum)
You will have labs checked today in the basement lab.  Please head down after you check out with the front desk  (cmet, cbc, inr). You will be set up for an ERCP (WL, MAC sedation, next available spot) for bile duct stent change, pancreatic cancer. Start miralax, one capful once daily for your constipation.

## 2013-09-29 ENCOUNTER — Telehealth: Payer: Self-pay

## 2013-09-29 ENCOUNTER — Encounter (HOSPITAL_COMMUNITY): Payer: Self-pay | Admitting: Pharmacy Technician

## 2013-09-29 DIAGNOSIS — C259 Malignant neoplasm of pancreas, unspecified: Secondary | ICD-10-CM

## 2013-09-29 NOTE — Telephone Encounter (Signed)
Dr Christella Hartigan, [name] [name] called and states that anesthesia will not sedate pt for her upcoming ERCP until she has cardiac clearance.  Please advise

## 2013-09-30 NOTE — Telephone Encounter (Signed)
Left message on machine to call back  

## 2013-09-30 NOTE — Telephone Encounter (Signed)
Ok, please set up app with cardiology.  She has seen someone in [facility] I believe, can ask her about that info.  She seems pretty confused about her medical issues and so if she cannot help with previous cadiologist, then set her up here in [place] (clearance for endoscopic procedure).  Thanks

## 2013-10-01 NOTE — Telephone Encounter (Signed)
Dr Delton See 10/06/13 4 pm pt has been notified and instructions were also given to the pt for ERCP, she was confused about the appt.  I also remailed the instructions to her home

## 2013-10-05 ENCOUNTER — Telehealth: Payer: Self-pay | Admitting: Dietician

## 2013-10-06 ENCOUNTER — Ambulatory Visit (INDEPENDENT_AMBULATORY_CARE_PROVIDER_SITE_OTHER): Payer: Medicaid Other | Admitting: Cardiology

## 2013-10-06 ENCOUNTER — Encounter: Payer: Self-pay | Admitting: Cardiology

## 2013-10-06 VITALS — BP 134/72 | HR 73 | Ht 61.0 in | Wt 149.0 lb

## 2013-10-06 DIAGNOSIS — R0609 Other forms of dyspnea: Secondary | ICD-10-CM

## 2013-10-06 DIAGNOSIS — R079 Chest pain, unspecified: Secondary | ICD-10-CM

## 2013-10-06 DIAGNOSIS — R0600 Dyspnea, unspecified: Secondary | ICD-10-CM

## 2013-10-06 DIAGNOSIS — R0989 Other specified symptoms and signs involving the circulatory and respiratory systems: Secondary | ICD-10-CM

## 2013-10-06 NOTE — Progress Notes (Signed)
Patient ID: Jasmane Brockway, female   DOB: 1956-05-16, 57 y.o.   MRN: [id number]     Patient Name: [name] [name] Date of Encounter: 10/06/2013  Primary Care Provider:  Dorrene German, MD Primary Cardiologist:  Tobias Alexander, H   Problem List   Past Medical History  Diagnosis Date  . DM (diabetes mellitus), type 2     requires insulin  . Hypertension   . Depression   . Anxiety   . Hyperlipidemia   . Pancreatitis   . GERD (gastroesophageal reflux disease)   . Pancreatic cancer 05/2013    adenocarcinoma on ERCP/FNA  . Hearing impaired     lost the last of her hearing aids and cn not get another one.  hearing is better via right ear.   Marland Kitchen History of radiation therapy 07/20/13-08/09/13    Pancreas 37.5Gy   Past Surgical History  Procedure Laterality Date  . Appendectomy    . Ercp N/A 05/24/2013    Procedure: ENDOSCOPIC RETROGRADE CHOLANGIOPANCREATOGRAPHY (ERCP);  Surgeon: Rachael Fee, MD;  Location: [facility] OR;  Service: Endoscopy;  Laterality: N/A;  MAC vs. general per anesthesia   . Tubal ligation      Allergies  Allergies  Allergen Reactions  . Betadine [Povidone Iodine] Itching and Rash    HPI  57 year old female with h/o DM2, HTN, HLP and pancreatic adenocarcinoma diagnosed in July 2014, s/p biliary stenting, neoadjuvant chemotherapy and radiation therapy.  She is now scheduled for another ERCP and requires cardiology clearance. The patient complains of chest pain that is not exertional. She also complains of dyspnea on exertion and lack of energy. She denies any dizziness, palpitations, or syncope. No orthopnea, PND or edema.   Home Medications  Prior to Admission medications   Medication Sig Start Date End Date Taking? Authorizing Provider  gabapentin (NEURONTIN) 300 MG capsule Take 300 mg by mouth 3 (three) times daily.    Historical Provider, MD  hyaluronate sodium (RADIAPLEXRX) GEL Apply 1 application topically 2 (two) times daily.    Historical Provider, MD    HYDROmorphone (DILAUDID) 4 MG tablet Take 0.5-1 tablets (2-4 mg total) by mouth every 4 (four) hours as needed for pain. 07/15/13   Rana Snare, NP  ibuprofen (ADVIL) 200 MG tablet Take 600 mg by mouth every 6 (six) hours as needed for pain.    Historical Provider, MD  insulin aspart (NOVOLOG) 100 UNIT/ML injection Inject 10 Units into the skin 3 (three) times daily with meals.    Historical Provider, MD  insulin glargine (LANTUS) 100 UNIT/ML injection Inject 20 Units into the skin at bedtime.    Historical Provider, MD  lisinopril (PRINIVIL,ZESTRIL) 20 MG tablet Take 1 tablet (20 mg total) by mouth daily. 07/15/13   Rana Snare, NP  ondansetron (ZOFRAN-ODT) 4 MG disintegrating tablet Take 4 mg by mouth every 8 (eight) hours as needed for nausea. 07/23/13   Jonna Coup, MD  OxyCODONE (OXYCONTIN) 10 mg T12A 12 hr tablet Take 1 tablet (10 mg total) by mouth every 12 (twelve) hours. 09/24/13   Rachael Fee, MD  pantoprazole (PROTONIX) 40 MG tablet Take 1 tablet (40 mg total) by mouth 2 (two) times daily. 08/15/13   Jeralyn Bennett, MD  polyethylene glycol (MIRALAX / GLYCOLAX) packet Take 17 g by mouth daily as needed (constipation).    Historical Provider, MD  potassium chloride (K-DUR,KLOR-CON) 10 MEQ tablet Take 10 mEq by mouth daily.    Historical Provider, MD  simethicone (MYLICON) 125  MG chewable tablet Chew 125 mg by mouth every 6 (six) hours as needed for flatulence.    Historical Provider, MD  simvastatin (ZOCOR) 10 MG tablet Take 10 mg by mouth at bedtime.    Historical Provider, MD  traMADol (ULTRAM) 50 MG tablet Take 1 tablet (50 mg total) by mouth every 6 (six) hours as needed for pain. 08/17/13   Rana Snare, NP    Family History  Family History  Problem Relation Age of Onset  . Diabetes type II Mother   . Hypertension Mother   . Heart attack Father   . HIV/AIDS Brother     Social History  History   Social History  . Marital Status: Single    Spouse Name: N/A     Number of Children: 5  . Years of Education: N/A   Occupational History  . Not on file.   Social History Main Topics  . Smoking status: Former Smoker -- 0.50 packs/day for 42 years    Quit date: 05/28/2013  . Smokeless tobacco: Never Used  . Alcohol Use: No  . Drug Use: No  . Sexual Activity: Yes    Birth Control/ Protection: Post-menopausal   Other Topics Concern  . Not on file   Social History Narrative  . No narrative on file     Review of Systems, as per HPI, otherwise negative General:  No chills, fever, night sweats or weight changes.  Cardiovascular:  No chest pain, dyspnea on exertion, edema, orthopnea, palpitations, paroxysmal nocturnal dyspnea. Dermatological: No rash, lesions/masses Respiratory: No cough, dyspnea Urologic: No hematuria, dysuria Abdominal:   No nausea, vomiting, diarrhea, bright red blood per rectum, melena, or hematemesis Neurologic:  No visual changes, wkns, changes in mental status. All other systems reviewed and are otherwise negative except as noted above.  Physical Exam BP 153/73, HR 75 BPM General: Pleasant, NAD Psych: Normal affect. Neuro: Alert and oriented X 3. Moves all extremities spontaneously. HEENT: Normal  Neck: Supple without bruits or JVD. Lungs:  Resp regular and unlabored, CTA. Heart: RRR no s3, s4, or murmurs. Abdomen: Soft, non-tender, non-distended, BS + x 4.  Extremities: No clubbing, cyanosis or edema. DP/PT/Radials 2+ and equal bilaterally.  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   Assessment & Plan  57 year old female with locally  advanced pancreatic cancer requiring ERCP for biliary stent placement.  She has symptoms of atypical chest pain and DOE. Considering her risk factors that include HTN, hyperlipidemia and DM we will schedule an echocardiogram and Lexiscan stress test to evaluate for heart function and to rule out ischemia.  Follow up in 3 weeks. We will call the patient with the results ASAP so we don't postpone her procedure.  Lars Masson, MD, [facility] 10/06/2013, 4:20 PM

## 2013-10-06 NOTE — Patient Instructions (Signed)
**Note De-Identified [name] [name] Obfuscation** Your physician has requested that you have a lexiscan myoview. For further information please visit [email or web address] Please follow instruction sheet, as given.  Your physician has requested that you have an echocardiogram. Echocardiography is a painless test that uses sound waves to create images of your heart. It provides your doctor with information about the size and shape of your heart and how well your heart's chambers and valves are working. This procedure takes approximately one hour. There are no restrictions for this procedure.  Your physician recommends that you schedule a follow-up appointment in: after [facility]

## 2013-10-07 ENCOUNTER — Telehealth: Payer: Self-pay

## 2013-10-07 ENCOUNTER — Ambulatory Visit: Admission: RE | Admit: 2013-10-07 | Payer: Medicaid Other | Source: Ambulatory Visit | Admitting: Radiation Oncology

## 2013-10-07 ENCOUNTER — Ambulatory Visit: Payer: Medicaid Other | Admitting: Radiation Oncology

## 2013-10-07 NOTE — Telephone Encounter (Signed)
Dr Christella Hartigan do you want to review Dr Elveria Royals note for cardiac clearance?

## 2013-10-07 NOTE — Telephone Encounter (Signed)
Message copied by Donata Duff on Thu Oct 07, 2013  8:21 AM ------      Message from: Donata Duff      Created: Fri Oct 01, 2013 12:30 PM       Pt has appt with Dr Delton See for cardiac clearance for ERCP  ------

## 2013-10-07 NOTE — Telephone Encounter (Signed)
Yes, please put on my desk.

## 2013-10-08 NOTE — Telephone Encounter (Signed)
It is in EPIC.

## 2013-10-08 NOTE — Telephone Encounter (Signed)
Thanks,  Dr. Delton See, I looked at your office note from 2 days ago and see that you have recommended some cardiac testing for [name] [name].  She has locally advanced pancreatic cancer and I am hoping to perform ERCP for her to change her bile duct stent that was placed 4 months ago. Her liver tests are normal and so there is not an urgency to the matter currently.  ERCP is done with anesthesia assistance (MAC or general usually), but they are not willing to sedate her until she is 'cleared' from cardiac standpoint. After you are done with your evaluation, could you please sent me a note with respect to this. Thanks  Wendall Papa

## 2013-10-10 DIAGNOSIS — R0600 Dyspnea, unspecified: Secondary | ICD-10-CM | POA: Insufficient documentation

## 2013-10-10 DIAGNOSIS — R0609 Other forms of dyspnea: Secondary | ICD-10-CM | POA: Insufficient documentation

## 2013-10-12 ENCOUNTER — Encounter: Payer: Self-pay | Admitting: Cardiology

## 2013-10-12 ENCOUNTER — Ambulatory Visit (INDEPENDENT_AMBULATORY_CARE_PROVIDER_SITE_OTHER): Payer: Medicaid Other | Admitting: Cardiology

## 2013-10-12 ENCOUNTER — Ambulatory Visit (HOSPITAL_COMMUNITY): Payer: Medicaid Other | Attending: Cardiology | Admitting: Radiology

## 2013-10-12 ENCOUNTER — Telehealth: Payer: Self-pay

## 2013-10-12 VITALS — BP 140/82 | HR 67 | Ht 62.0 in | Wt 149.0 lb

## 2013-10-12 VITALS — BP 147/82 | HR 59 | Ht 62.0 in | Wt 151.0 lb

## 2013-10-12 DIAGNOSIS — R0989 Other specified symptoms and signs involving the circulatory and respiratory systems: Secondary | ICD-10-CM | POA: Insufficient documentation

## 2013-10-12 DIAGNOSIS — R0609 Other forms of dyspnea: Secondary | ICD-10-CM | POA: Insufficient documentation

## 2013-10-12 DIAGNOSIS — R0789 Other chest pain: Secondary | ICD-10-CM

## 2013-10-12 DIAGNOSIS — I10 Essential (primary) hypertension: Secondary | ICD-10-CM

## 2013-10-12 DIAGNOSIS — R0602 Shortness of breath: Secondary | ICD-10-CM

## 2013-10-12 DIAGNOSIS — R079 Chest pain, unspecified: Secondary | ICD-10-CM | POA: Insufficient documentation

## 2013-10-12 MED ORDER — METOPROLOL TARTRATE 25 MG PO TABS: ORAL_TABLET | ORAL | Status: DC

## 2013-10-12 MED ORDER — REGADENOSON 0.4 MG/5ML IV SOLN
0.4000 mg | Freq: Once | INTRAVENOUS | Status: AC
  Administered 2013-10-12: 0.4 mg via INTRAVENOUS

## 2013-10-12 MED ORDER — TECHNETIUM TC 99M SESTAMIBI GENERIC - CARDIOLITE
11.0000 | Freq: Once | INTRAVENOUS | Status: AC | PRN
  Administered 2013-10-12: 11 via INTRAVENOUS

## 2013-10-12 MED ORDER — TECHNETIUM TC 99M SESTAMIBI GENERIC - CARDIOLITE
33.0000 | Freq: Once | INTRAVENOUS | Status: AC | PRN
  Administered 2013-10-12: 33 via INTRAVENOUS

## 2013-10-12 NOTE — Progress Notes (Signed)
[facility] SITE 3 NUCLEAR MED [address] [place], [place] [address] [phone]    Cardiology Nuclear Med Study  [name] [name] is a 57 y.o. female     MRN : [id number]     DOB: 1955-11-19  Procedure Date: 10/12/2013  Nuclear Med Background Indication for Stress Test:  Evaluation for Ischemia and Surgical Clearance for ERCP due to pancreatic cancer by Dr. Rob Bunting on 10-21-13 History:  No prior known history of CAD Cardiac Risk Factors: Family History - CAD, History of Smoking, Hypertension, IDDM, and Lipids  Symptoms:  Chest pain with/without exertion (last occurrence one month ago), DOE   Nuclear Pre-Procedure Caffeine/Decaff Intake:  None > 12 hours NPO After: 9:00am   Lungs:  clear O2 Sat: 98% on room air. IV 0.9% NS with Angio Cath:  22g  IV Site: R Forearm  IV Started by:  Rickard Patience  Chest Size (in):  38 Cup Size: C  Height: 5\' 2"  (1.575 m)  Weight:  151 lb (68.493 kg)  BMI:  Body mass index is 27.61 kg/(m^2). Tech Comments:  Held Insulin as Instructed.     Nuclear Med Study 1 or 2 day study: 1 day  Stress Test Type:  Eugenie Birks  Reading MD: Marca Ancona, MD  Order Authorizing Provider: Tobias Alexander, MD   Resting Radionuclide: Technetium 32m Sestamibi  Resting Radionuclide Dose: 11.0 mCi   Stress Radionuclide:  Technetium 26m Sestamibi  Stress Radionuclide Dose: 33.0 mCi           Stress Protocol Rest HR: 59 Stress HR: 96  Rest BP: 147/82 Stress BP: 146/89  Exercise Time (min): n/a METS: n/a   Predicted Max HR: 161 bpm % Max HR: 59.63 bpm Rate Pressure Product: 78469   Dose of Adenosine (mg):  n/a Dose of Lexiscan: 0.4 mg  Dose of Atropine (mg): n/a Dose of Dobutamine: n/a mcg/kg/min (at max HR)  Stress Test Technologist: Irean Hong, RN  Nuclear Technologist:  Dario Guardian, CNMT     Rest Procedure:  Myocardial perfusion imaging was performed at rest 45 minutes following the intravenous administration of Technetium 61m  Sestamibi. Rest ECG: NSR - Normal EKG  Stress Procedure:  The patient received IV Lexiscan 0.4 mg over 15-seconds.  Technetium 36m Sestamibi injected at 30-seconds.  Quantitative spect images were obtained after a 45 minute delay. Stress ECG: No significant change from baseline ECG  QPS Raw Data Images:  Normal; no motion artifact; normal heart/lung ratio. Stress Images:  Normal homogeneous uptake in all areas of the myocardium. Rest Images:  Normal homogeneous uptake in all areas of the myocardium. Subtraction (SDS):  Reversible, small mild basal to mid inferolateral perfusion defect.  Transient Ischemic Dilatation (Normal <1.22):  1.06 Lung/Heart Ratio (Normal <0.45):  0.30  Quantitative Gated Spect Images QGS EDV:  88 ml QGS ESV:  34 ml  Impression Exercise Capacity:  Lexiscan with no exercise. BP Response:  Normal blood pressure response. Clinical Symptoms:  Short of breath.  ECG Impression:  No significant ST segment change suggestive of ischemia. Comparison with Prior Nuclear Study: No previous nuclear study performed  Overall Impression:  Normal study.   LV Ejection Fraction: 62%.  LV Wall Motion:  NL LV Function; NL Wall Motion  Marca Ancona 10/12/2013

## 2013-10-12 NOTE — Patient Instructions (Signed)
Start Metoprolol 25 mg take 1/2 tablet twice a day     Your physician wants you to follow-up in: 6 months. receive a reminder letter in the mail two months in advance. If you don't receive a letter, please call our office to schedule the follow-up appointment.

## 2013-10-12 NOTE — Progress Notes (Signed)
Patient ID: Aeva Posey, female   DOB: 1955/11/20, 57 y.o.   MRN: [id number]     Patient Name: [name] [name] Date of Encounter: 10/12/2013  Primary Care Provider:  Dorrene German, MD Primary Cardiologist:  Tobias Alexander, H   Problem List   Past Medical History  Diagnosis Date  . DM (diabetes mellitus), type 2     requires insulin  . Hypertension   . Depression   . Anxiety   . Hyperlipidemia   . Pancreatitis   . GERD (gastroesophageal reflux disease)   . Pancreatic cancer 05/2013    adenocarcinoma on ERCP/FNA  . Hearing impaired     lost the last of her hearing aids and cn not get another one.  hearing is better via right ear.   Marland Kitchen History of radiation therapy 07/20/13-08/09/13    Pancreas 37.5Gy   Past Surgical History  Procedure Laterality Date  . Appendectomy    . Ercp N/A 05/24/2013    Procedure: ENDOSCOPIC RETROGRADE CHOLANGIOPANCREATOGRAPHY (ERCP);  Surgeon: Rachael Fee, MD;  Location: [facility] OR;  Service: Endoscopy;  Laterality: N/A;  MAC vs. general per anesthesia   . Tubal ligation      Allergies  Allergies  Allergen Reactions  . Betadine [Povidone Iodine] Itching and Rash    HPI  57 year old female with h/o DM2, HTN, HLP and pancreatic adenocarcinoma diagnosed in July 2014, s/p biliary stenting, neoadjuvant chemotherapy and radiation therapy.  She is now scheduled for another ERCP and requires cardiology clearance. The patient complains of chest pain that is not exertional. She also complains of dyspnea on exertion and lack of energy. She denies any dizziness, palpitations, or syncope. No orthopnea, PND or edema.  Today the patient comes for her nuclear stress test. She has felt well since the last visit, no new complains.  Home Medications  Prior to Admission medications   Medication Sig Start Date End Date Taking? Authorizing Provider  gabapentin (NEURONTIN) 300 MG capsule Take 300 mg by mouth 3 (three) times daily.    Historical Provider, MD    hyaluronate sodium (RADIAPLEXRX) GEL Apply 1 application topically 2 (two) times daily.    Historical Provider, MD  HYDROmorphone (DILAUDID) 4 MG tablet Take 0.5-1 tablets (2-4 mg total) by mouth every 4 (four) hours as needed for pain. 07/15/13   Rana Snare, NP  ibuprofen (ADVIL) 200 MG tablet Take 600 mg by mouth every 6 (six) hours as needed for pain.    Historical Provider, MD  insulin aspart (NOVOLOG) 100 UNIT/ML injection Inject 10 Units into the skin 3 (three) times daily with meals.    Historical Provider, MD  insulin glargine (LANTUS) 100 UNIT/ML injection Inject 20 Units into the skin at bedtime.    Historical Provider, MD  lisinopril (PRINIVIL,ZESTRIL) 20 MG tablet Take 1 tablet (20 mg total) by mouth daily. 07/15/13   Rana Snare, NP  ondansetron (ZOFRAN-ODT) 4 MG disintegrating tablet Take 4 mg by mouth every 8 (eight) hours as needed for nausea. 07/23/13   Jonna Coup, MD  OxyCODONE (OXYCONTIN) 10 mg T12A 12 hr tablet Take 1 tablet (10 mg total) by mouth every 12 (twelve) hours. 09/24/13   Rachael Fee, MD  pantoprazole (PROTONIX) 40 MG tablet Take 1 tablet (40 mg total) by mouth 2 (two) times daily. 08/15/13   Jeralyn Bennett, MD  polyethylene glycol (MIRALAX / GLYCOLAX) packet Take 17 g by mouth daily as needed (constipation).    Historical Provider, MD  potassium chloride (  K-DUR,KLOR-CON) 10 MEQ tablet Take 10 mEq by mouth daily.    Historical Provider, MD  simethicone (MYLICON) 125 MG chewable tablet Chew 125 mg by mouth every 6 (six) hours as needed for flatulence.    Historical Provider, MD  simvastatin (ZOCOR) 10 MG tablet Take 10 mg by mouth at bedtime.    Historical Provider, MD  traMADol (ULTRAM) 50 MG tablet Take 1 tablet (50 mg total) by mouth every 6 (six) hours as needed for pain. 08/17/13   Rana Snare, NP    Family History  Family History  Problem Relation Age of Onset  . Diabetes type II Mother   . Hypertension Mother   . Heart attack Father   .  HIV/AIDS Brother     Social History  History   Social History  . Marital Status: Single    Spouse Name: N/A    Number of Children: 5  . Years of Education: N/A   Occupational History  . Not on file.   Social History Main Topics  . Smoking status: Former Smoker -- 0.50 packs/day for 42 years    Quit date: 05/28/2013  . Smokeless tobacco: Never Used  . Alcohol Use: No  . Drug Use: No  . Sexual Activity: Yes    Birth Control/ Protection: Post-menopausal   Other Topics Concern  . Not on file   Social History Narrative  . No narrative on file     Review of Systems, as per HPI, otherwise negative General:  No chills, fever, night sweats or weight changes.  Cardiovascular:  No chest pain, dyspnea on exertion, edema, orthopnea, palpitations, paroxysmal nocturnal dyspnea. Dermatological: No rash, lesions/masses Respiratory: No cough, dyspnea Urologic: No hematuria, dysuria Abdominal:   No nausea, vomiting, diarrhea, bright red blood per rectum, melena, or hematemesis Neurologic:  No visual changes, wkns, changes in mental status. All other systems reviewed and are otherwise negative except as noted above.  Physical Exam BP 153/73, HR 75 BPM General: Pleasant, NAD Psych: Normal affect. Neuro: Alert and oriented X 3. Moves all extremities spontaneously. HEENT: Normal  Neck: Supple without bruits or JVD. Lungs:  Resp regular and unlabored, CTA. Heart: RRR no s3, s4, or murmurs. Abdomen: Soft, non-tender, non-distended, BS + x 4.  Extremities: No clubbing, cyanosis or edema. DP/PT/Radials 2+ and equal bilaterally.  Labs:  No results found for this basename: CKTOTAL, CKMB, TROPONINI,  in the last 72 hours Lab Results  Component Value Date   WBC 4.4* 09/24/2013   HGB 13.2 09/24/2013   HCT 39.0 09/24/2013   MCV 88.1 09/24/2013   PLT 171.0 09/24/2013    Accessory Clinical Findings  Echocardiogram - none to date  ECG - Sinus rhythm, possible type 2 Brugada pattern,  abnormal ECG, unchanged from the prior preformed on 05/29/2013.   Assessment & Plan  57 year old female with locally advanced pancreatic cancer requiring ERCP for biliary stent placement.  She has symptoms of atypical chest pain and DOE. Lexiscan nuclear stress tesrt performed today showed normal left ventricular function with no wall motion abnormalities and no perfusion defects suggestive of a scar or ischemia.   There is currently no contraindication from cardiology standpoint for this patient to undergo her procedure (ERCP and stent placement).  Because her blood pressure is elevated we will prescribe her Metoprolol 12.5 mg PO BID that should be continued in the perioperative period.   Follow up in 6 weeks.  Lars Masson, MD, [facility] 10/12/2013, 4:17 PM

## 2013-10-12 NOTE — Telephone Encounter (Signed)
The pt is scheduled to have Myoview today @ 12:30. She was advised at last OV to only take 1/2 dose of her insulin last night and none this am. The pt states that she did take her insulin this am at 4 and she wants to know what to do. I spoke with our Nuc dept and was advised to tell pt to eat some breakfast now and that she can still have Myoview today. The pt verbalized understanding.

## 2013-10-15 ENCOUNTER — Telehealth: Payer: Self-pay | Admitting: *Deleted

## 2013-10-15 NOTE — Telephone Encounter (Signed)
Was able to reach patient and her daughter on the patient's listed phone number.  The daughter has changed phone numbers and this was updated.  This RN informed the patient that she needed a follow-up visit with Dr. Truett Perna.  This RN confirmed her scheduled follow-up visit with Dr. Mitzi Hansen.  Per patient's daughter, "we have still not moved to [facility] yet".  She stated that her mother had been doing okay and had been making it to her other MD visits.  She denied complaints at present time.  This RN informed patient's daughter that this office would be in touch with a follow-up appointment for patient with Dr. Truett Perna.   Patient and daughter verbalized understanding.

## 2013-10-18 ENCOUNTER — Telehealth: Payer: Self-pay | Admitting: Oncology

## 2013-10-18 ENCOUNTER — Other Ambulatory Visit: Payer: Self-pay | Admitting: *Deleted

## 2013-10-18 ENCOUNTER — Telehealth: Payer: Self-pay | Admitting: *Deleted

## 2013-10-18 NOTE — Telephone Encounter (Signed)
Spoke with patient by phone to give appointment for 10/19/13.  Patient is unable to make appointment due to [facility] being at max capacity and unable to bring her.  She has to give at least 2 days notice to arrange for transportation.  Will notify MD.

## 2013-10-18 NOTE — Telephone Encounter (Signed)
[name] [name] walked to my desk stating GBS wanted this pt added to his schedule for 12 noon tomorrow 12/2...added appt.  Almira Coaster is going to call pt. shh

## 2013-10-19 ENCOUNTER — Encounter (HOSPITAL_COMMUNITY): Payer: Self-pay | Admitting: Pharmacy Technician

## 2013-10-19 ENCOUNTER — Other Ambulatory Visit: Payer: Self-pay | Admitting: *Deleted

## 2013-10-19 ENCOUNTER — Ambulatory Visit: Payer: Medicaid Other | Admitting: Oncology

## 2013-10-19 ENCOUNTER — Telehealth: Payer: Self-pay | Admitting: Oncology

## 2013-10-19 NOTE — Progress Notes (Signed)
Left voice message for patient with appointment asking to please return the call.

## 2013-10-19 NOTE — Telephone Encounter (Signed)
lmonvm for pt re appt for 12/8 and mailed schedule. per Almira Coaster she also called and lm for pt to call to confirm appt. schedule mailed

## 2013-10-20 ENCOUNTER — Telehealth: Payer: Self-pay

## 2013-10-20 ENCOUNTER — Ambulatory Visit
Admission: RE | Admit: 2013-10-20 | Discharge: 2013-10-20 | Disposition: A | Discharge status: Home / Self Care | Payer: Medicaid Other | Source: Ambulatory Visit | Attending: Radiation Oncology | Admitting: Radiation Oncology

## 2013-10-20 ENCOUNTER — Encounter: Payer: Self-pay | Admitting: Radiation Oncology

## 2013-10-20 VITALS — BP 146/83 | HR 70 | Temp 98.4°F | Resp 20 | Wt 152.3 lb

## 2013-10-20 DIAGNOSIS — C250 Malignant neoplasm of head of pancreas: Secondary | ICD-10-CM

## 2013-10-20 NOTE — Telephone Encounter (Signed)
  FYI:  Dr Christella Hartigan did you see this?  We still have her scheduled for tomorrow.  57 year old female with locally advanced pancreatic cancer requiring ERCP for biliary stent placement.  She has symptoms of atypical chest pain and DOE. Lexiscan nuclear stress tesrt performed today showed normal left ventricular function with no wall motion abnormalities and no perfusion defects suggestive of a scar or ischemia.  There is currently no contraindication from cardiology standpoint for this patient to undergo her procedure (ERCP and stent placement).  Because her blood pressure is elevated we will prescribe her Metoprolol 12.5 mg PO BID that should be continued in the perioperative period.  Follow up in 6 weeks.  Lars Masson, MD, [facility]  10/12/2013, 4:17 PM

## 2013-10-20 NOTE — Progress Notes (Signed)
Radiation Oncology         [phone] ________________________________  Name: [name] [name] MRN: [id number]  Date: 10/20/2013  DOB: 07-17-1956  Follow-Up Visit Note  CC: Dorrene German, MD  Dorrene German, MD  Diagnosis:  Pancreatic cancer   Interval Since Last Radiation:  The patient completed radiation treatment on 08/09/2013   Narrative:  The patient returns today for routine follow-up.  The patient has missed some appointments in our clinic and it appears that she also has missed some medical oncology appointments as well. The patient states that she has done reasonably well since she finished. She is complaining of some ongoing fatigue. She is scheduled to have a new biliary stent placed tomorrow.    The patient denies any significant nausea at this time. No diarrhea. Some ongoing back pain which is stable.                           ALLERGIES:  is allergic to betadine.  Meds: Current Outpatient Prescriptions  Medication Sig Dispense Refill  . gabapentin (NEURONTIN) 300 MG capsule Take 300 mg by mouth 3 (three) times daily.      Marland Kitchen HYDROmorphone (DILAUDID) 4 MG tablet Take 2-4 mg by mouth every 4 (four) hours as needed for moderate pain or severe pain.      Marland Kitchen ibuprofen (ADVIL) 200 MG tablet Take 600 mg by mouth every 6 (six) hours as needed for mild pain or moderate pain.       Marland Kitchen insulin aspart (NOVOLOG) 100 UNIT/ML injection Inject 10 Units into the skin 3 (three) times daily with meals.      . insulin glargine (LANTUS) 100 UNIT/ML injection Inject 20 Units into the skin at bedtime.      Marland Kitchen lisinopril (PRINIVIL,ZESTRIL) 20 MG tablet Take 20 mg by mouth daily with breakfast.      . metoprolol tartrate (LOPRESSOR) 25 MG tablet Take 1/2 tablet twice a day      . OxyCODONE (OXYCONTIN) 10 mg T12A 12 hr tablet Take 10 mg by mouth every 12 (twelve) hours.      . pantoprazole (PROTONIX) 40 MG tablet Take 40 mg by mouth 2 (two) times daily.      . polyethylene glycol (MIRALAX / GLYCOLAX)  packet Take 17 g by mouth daily as needed for mild constipation or moderate constipation (constipation).       . potassium chloride (K-DUR,KLOR-CON) 10 MEQ tablet Take 10 mEq by mouth daily.      . simethicone (MYLICON) 125 MG chewable tablet Chew 125 mg by mouth every 6 (six) hours as needed for flatulence.      . simvastatin (ZOCOR) 10 MG tablet Take 10 mg by mouth at bedtime.      . traMADol (ULTRAM) 50 MG tablet Take 50 mg by mouth every 6 (six) hours as needed for pain.      . [DISCONTINUED] enoxaparin (LOVENOX) 80 MG/0.8ML injection Inject 0.7 mLs (70 mg total) into the skin every 12 (twelve) hours.  60 Syringe  1   No current facility-administered medications for this encounter.    Physical Findings: The patient is in no acute distress. Patient is alert and oriented.  weight is 152 lb 4.8 oz (69.083 kg). Her oral temperature is 98.4 F (36.9 C). Her blood pressure is 146/83 and her pulse is 70. Her respiration is 20. Marland Kitchen   General: Well-developed, in no acute distress HEENT: Normocephalic, atraumatic Cardiovascular: Regular rate and  rhythm Respiratory: Clear to auscultation bilaterally GI: Soft, nontender, normal bowel sounds Extremities: No edema present   Lab Findings: Lab Results  Component Value Date   WBC 4.4* 09/24/2013   HGB 13.2 09/24/2013   HCT 39.0 09/24/2013   MCV 88.1 09/24/2013   PLT 171.0 09/24/2013     Radiographic Findings: No results found.  Impression:    The patient is doing satisfactorily at this time from a radiation standpoint. No role for further radiation treatment currently. I stressed to her the importance of her making the appointment with Dr. Truett Perna next Monday. She expressed understanding of this.  Plan:  Followup in 4 months.   Radene Gunning, M.D., Ph.D.

## 2013-10-20 NOTE — Progress Notes (Signed)
Pt states she has chronic low back pain but also reports hip and thigh pain today she states is because of walking yesterday. She denies nausea, loss of appetite but reports fatigue and leg weakness. She is inquiring about physical therapy. Advised she discuss w/her PCP.

## 2013-10-21 ENCOUNTER — Encounter (HOSPITAL_COMMUNITY): Payer: Self-pay | Admitting: Anesthesiology

## 2013-10-21 ENCOUNTER — Ambulatory Visit (HOSPITAL_COMMUNITY): Payer: Medicaid Other | Admitting: Anesthesiology

## 2013-10-21 ENCOUNTER — Observation Stay (HOSPITAL_COMMUNITY)
Admission: RE | Admit: 2013-10-21 | Discharge: 2013-10-23 | Disposition: A | Discharge status: Home / Self Care | Payer: Medicaid Other | Source: Ambulatory Visit | Attending: Internal Medicine | Admitting: Internal Medicine

## 2013-10-21 ENCOUNTER — Encounter (HOSPITAL_COMMUNITY)
Admission: RE | Disposition: A | Discharge status: Home / Self Care | Payer: Self-pay | Source: Ambulatory Visit | Attending: Internal Medicine

## 2013-10-21 ENCOUNTER — Ambulatory Visit (HOSPITAL_COMMUNITY): Payer: Medicaid Other

## 2013-10-21 ENCOUNTER — Encounter (HOSPITAL_COMMUNITY): Payer: Medicaid Other | Admitting: Anesthesiology

## 2013-10-21 DIAGNOSIS — E119 Type 2 diabetes mellitus without complications: Secondary | ICD-10-CM | POA: Insufficient documentation

## 2013-10-21 DIAGNOSIS — K219 Gastro-esophageal reflux disease without esophagitis: Secondary | ICD-10-CM | POA: Insufficient documentation

## 2013-10-21 DIAGNOSIS — C259 Malignant neoplasm of pancreas, unspecified: Principal | ICD-10-CM | POA: Insufficient documentation

## 2013-10-21 DIAGNOSIS — Z923 Personal history of irradiation: Secondary | ICD-10-CM | POA: Insufficient documentation

## 2013-10-21 DIAGNOSIS — E785 Hyperlipidemia, unspecified: Secondary | ICD-10-CM | POA: Insufficient documentation

## 2013-10-21 DIAGNOSIS — Z794 Long term (current) use of insulin: Secondary | ICD-10-CM | POA: Insufficient documentation

## 2013-10-21 DIAGNOSIS — I10 Essential (primary) hypertension: Secondary | ICD-10-CM | POA: Insufficient documentation

## 2013-10-21 DIAGNOSIS — I82890 Acute embolism and thrombosis of other specified veins: Secondary | ICD-10-CM | POA: Insufficient documentation

## 2013-10-21 DIAGNOSIS — R1013 Epigastric pain: Secondary | ICD-10-CM

## 2013-10-21 DIAGNOSIS — K259 Gastric ulcer, unspecified as acute or chronic, without hemorrhage or perforation: Secondary | ICD-10-CM | POA: Insufficient documentation

## 2013-10-21 DIAGNOSIS — K831 Obstruction of bile duct: Secondary | ICD-10-CM | POA: Insufficient documentation

## 2013-10-21 HISTORY — PX: BILIARY STENT PLACEMENT: SHX5538

## 2013-10-21 HISTORY — PX: ENDOSCOPIC RETROGRADE CHOLANGIOPANCREATOGRAPHY (ERCP) WITH PROPOFOL: SHX5810

## 2013-10-21 LAB — GLUCOSE, CAPILLARY
Glucose-Capillary: 178 mg/dL — ABNORMAL HIGH (ref 70–99)
Glucose-Capillary: 173 mg/dL — ABNORMAL HIGH (ref 70–99)

## 2013-10-21 SURGERY — ENDOSCOPIC RETROGRADE CHOLANGIOPANCREATOGRAPHY (ERCP) WITH PROPOFOL
Anesthesia: Monitor Anesthesia Care

## 2013-10-21 MED ORDER — PROMETHAZINE HCL 25 MG/ML IJ SOLN
6.2500 mg | INTRAMUSCULAR | Status: AC | PRN
  Administered 2013-10-21 – 2013-10-22 (×2): 12.5 mg via INTRAVENOUS
  Filled 2013-10-21 (×2): qty 1

## 2013-10-21 MED ORDER — ACETAMINOPHEN 325 MG PO TABS
650.0000 mg | ORAL_TABLET | Freq: Four times a day (QID) | ORAL | Status: DC | PRN
  Administered 2013-10-22: 650 mg via ORAL
  Filled 2013-10-21 (×2): qty 2

## 2013-10-21 MED ORDER — SODIUM CHLORIDE 0.9 % IJ SOLN: 3.0000 mL | INTRAMUSCULAR | Status: DC | PRN

## 2013-10-21 MED ORDER — IOHEXOL 300 MG/ML  SOLN
INTRAMUSCULAR | Status: DC | PRN
  Administered 2013-10-21: 14:00:00

## 2013-10-21 MED ORDER — ONDANSETRON HCL 4 MG PO TABS
4.0000 mg | ORAL_TABLET | Freq: Four times a day (QID) | ORAL | Status: DC | PRN
  Filled 2013-10-21 (×2): qty 1

## 2013-10-21 MED ORDER — FENTANYL CITRATE 0.05 MG/ML IJ SOLN
INTRAMUSCULAR | Status: DC | PRN
  Administered 2013-10-21 (×2): 50 ug via INTRAVENOUS

## 2013-10-21 MED ORDER — METOPROLOL TARTRATE 12.5 MG HALF TABLET
12.5000 mg | ORAL_TABLET | Freq: Two times a day (BID) | ORAL | Status: DC
  Administered 2013-10-21 – 2013-10-23 (×3): 12.5 mg via ORAL
  Filled 2013-10-21 (×5): qty 1

## 2013-10-21 MED ORDER — HYDROMORPHONE HCL PF 1 MG/ML IJ SOLN
0.5000 mg | INTRAMUSCULAR | Status: DC | PRN
  Administered 2013-10-21 – 2013-10-23 (×4): 0.5 mg via INTRAVENOUS
  Filled 2013-10-21 (×4): qty 1

## 2013-10-21 MED ORDER — ONDANSETRON HCL 4 MG/2ML IJ SOLN
4.0000 mg | Freq: Four times a day (QID) | INTRAMUSCULAR | Status: DC | PRN
  Administered 2013-10-22 – 2013-10-23 (×2): 4 mg via INTRAVENOUS
  Filled 2013-10-21 (×2): qty 2

## 2013-10-21 MED ORDER — PNEUMOCOCCAL VAC POLYVALENT 25 MCG/0.5ML IJ INJ
0.5000 mL | INJECTION | INTRAMUSCULAR | Status: AC
  Administered 2013-10-22: 0.5 mL via INTRAMUSCULAR
  Filled 2013-10-21 (×2): qty 0.5

## 2013-10-21 MED ORDER — FENTANYL CITRATE 0.05 MG/ML IJ SOLN
INTRAMUSCULAR | Status: AC
  Filled 2013-10-21: qty 2

## 2013-10-21 MED ORDER — POLYETHYLENE GLYCOL 3350 17 G PO PACK
17.0000 g | PACK | Freq: Every day | ORAL | Status: DC | PRN
  Filled 2013-10-21: qty 1

## 2013-10-21 MED ORDER — PANTOPRAZOLE SODIUM 40 MG PO TBEC
40.0000 mg | DELAYED_RELEASE_TABLET | Freq: Two times a day (BID) | ORAL | Status: DC
  Administered 2013-10-21 – 2013-10-23 (×4): 40 mg via ORAL
  Filled 2013-10-21 (×6): qty 1

## 2013-10-21 MED ORDER — GABAPENTIN 300 MG PO CAPS
300.0000 mg | ORAL_CAPSULE | Freq: Three times a day (TID) | ORAL | Status: DC
  Administered 2013-10-21 – 2013-10-23 (×5): 300 mg via ORAL
  Filled 2013-10-21 (×7): qty 1

## 2013-10-21 MED ORDER — CIPROFLOXACIN IN D5W 400 MG/200ML IV SOLN
INTRAVENOUS | Status: AC
  Filled 2013-10-21: qty 200

## 2013-10-21 MED ORDER — PROPOFOL 10 MG/ML IV BOLUS
INTRAVENOUS | Status: AC
  Filled 2013-10-21: qty 20

## 2013-10-21 MED ORDER — INSULIN GLARGINE 100 UNIT/ML ~~LOC~~ SOLN
30.0000 [IU] | Freq: Every day | SUBCUTANEOUS | Status: DC
  Administered 2013-10-21 – 2013-10-22 (×2): 30 [IU] via SUBCUTANEOUS
  Filled 2013-10-21 (×3): qty 0.3

## 2013-10-21 MED ORDER — BUTAMBEN-TETRACAINE-BENZOCAINE 2-2-14 % EX AERO
INHALATION_SPRAY | CUTANEOUS | Status: DC | PRN
  Administered 2013-10-21: 2 via TOPICAL

## 2013-10-21 MED ORDER — MIDAZOLAM HCL 2 MG/2ML IJ SOLN
INTRAMUSCULAR | Status: AC
  Filled 2013-10-21: qty 2

## 2013-10-21 MED ORDER — SODIUM CHLORIDE 0.9 % IJ SOLN: 3.0000 mL | Freq: Two times a day (BID) | INTRAMUSCULAR | Status: DC

## 2013-10-21 MED ORDER — SODIUM CHLORIDE 0.9 % IV SOLN: INTRAVENOUS | Status: DC

## 2013-10-21 MED ORDER — SUCRALFATE 1 GM/10ML PO SUSP: 1.0000 g | Freq: Two times a day (BID) | ORAL | Status: DC

## 2013-10-21 MED ORDER — INFLUENZA VAC SPLIT QUAD 0.5 ML IM SUSP
0.5000 mL | INTRAMUSCULAR | Status: AC
  Administered 2013-10-22: 0.5 mL via INTRAMUSCULAR
  Filled 2013-10-21 (×2): qty 0.5

## 2013-10-21 MED ORDER — SODIUM CHLORIDE 0.9 % IV SOLN: 250.0000 mL | INTRAVENOUS | Status: DC | PRN

## 2013-10-21 MED ORDER — PROPOFOL 10 MG/ML IV BOLUS
INTRAVENOUS | Status: DC | PRN
  Administered 2013-10-21: 25 mg via INTRAVENOUS
  Administered 2013-10-21: 15 mg via INTRAVENOUS
  Administered 2013-10-21: 50 mg via INTRAVENOUS
  Administered 2013-10-21 (×2): 25 mg via INTRAVENOUS
  Administered 2013-10-21 (×2): 50 mg via INTRAVENOUS
  Administered 2013-10-21: 35 mg via INTRAVENOUS

## 2013-10-21 MED ORDER — LISINOPRIL 20 MG PO TABS
20.0000 mg | ORAL_TABLET | Freq: Every day | ORAL | Status: DC
  Administered 2013-10-22 – 2013-10-23 (×2): 20 mg via ORAL
  Filled 2013-10-21 (×2): qty 1

## 2013-10-21 MED ORDER — POTASSIUM CHLORIDE IN NACL 20-0.45 MEQ/L-% IV SOLN
INTRAVENOUS | Status: DC
  Administered 2013-10-21: 17:00:00 via INTRAVENOUS
  Filled 2013-10-21 (×10): qty 1000

## 2013-10-21 MED ORDER — INSULIN ASPART 100 UNIT/ML ~~LOC~~ SOLN
0.0000 [IU] | Freq: Three times a day (TID) | SUBCUTANEOUS | Status: DC
  Administered 2013-10-21 – 2013-10-22 (×3): 2 [IU] via SUBCUTANEOUS
  Administered 2013-10-22: 1 [IU] via SUBCUTANEOUS
  Administered 2013-10-23: 5 [IU] via SUBCUTANEOUS
  Administered 2013-10-23: 1 [IU] via SUBCUTANEOUS

## 2013-10-21 MED ORDER — MIDAZOLAM HCL 5 MG/5ML IJ SOLN
INTRAMUSCULAR | Status: DC | PRN
  Administered 2013-10-21: 2 mg via INTRAVENOUS

## 2013-10-21 MED ORDER — ACETAMINOPHEN 650 MG RE SUPP
650.0000 mg | Freq: Four times a day (QID) | RECTAL | Status: DC | PRN
  Filled 2013-10-21: qty 1

## 2013-10-21 MED ORDER — SIMVASTATIN 10 MG PO TABS
10.0000 mg | ORAL_TABLET | Freq: Every day | ORAL | Status: DC
  Administered 2013-10-21 – 2013-10-22 (×2): 10 mg via ORAL
  Filled 2013-10-21 (×3): qty 1

## 2013-10-21 MED ORDER — CIPROFLOXACIN IN D5W 400 MG/200ML IV SOLN
400.0000 mg | Freq: Two times a day (BID) | INTRAVENOUS | Status: DC
  Administered 2013-10-21 – 2013-10-23 (×6): 400 mg via INTRAVENOUS
  Filled 2013-10-21 (×6): qty 200

## 2013-10-21 MED ORDER — LACTATED RINGERS IV SOLN
INTRAVENOUS | Status: DC
  Administered 2013-10-21 (×3): via INTRAVENOUS

## 2013-10-21 MED ORDER — FENTANYL CITRATE 0.05 MG/ML IJ SOLN
25.0000 ug | Freq: Once | INTRAMUSCULAR | Status: AC
  Administered 2013-10-21: 25 ug via INTRAVENOUS

## 2013-10-21 NOTE — Interval H&P Note (Signed)
History and Physical Interval Note:  10/21/2013 12:16 PM  [name] [name]  has presented today for surgery, with the diagnosis of pancreatic cancer  The various methods of treatment have been discussed with the patient and family. After consideration of risks, benefits and other options for treatment, the patient has consented to  Procedure(s) with comments: ENDOSCOPIC RETROGRADE CHOLANGIOPANCREATOGRAPHY (ERCP) WITH PROPOFOL (N/A) - Stent removal  BILIARY STENT PLACEMENT (N/A) as a surgical intervention .  The patient's history has been reviewed, patient examined, no change in status, stable for surgery.  I have reviewed the patient's chart and labs.  Questions were answered to the patient's satisfaction.     Rachael Fee

## 2013-10-21 NOTE — Op Note (Signed)
[facility] [address] [place], [address]   ERCP PROCEDURE REPORT  PATIENT: [name] [name], [name] [name]  MR# :[id number] BIRTHDATE: 10/20/1956  GENDER: Female ENDOSCOPIST: Rachael Fee, MD PROCEDURE DATE:  10/21/2013 PROCEDURE:   ERCP with balloon dilation and ERCP with stent placement ASA CLASS:   Class III INDICATIONS:knonw, locally advanced pancreatic cancer; biliary stent in place since 5 months ago; here for exchange. MEDICATIONS: MAC sedation, administered by CRNA TOPICAL ANESTHETIC: none  DESCRIPTION OF PROCEDURE:   After the risks benefits and alternatives of the procedure were thoroughly explained, informed consent was obtained.  The Pentax Ercp Scope [id number]  endoscope was introduced through the mouth  and advanced to the second portion of the duodenum .   There was extensive edema, erythema in distal stomach, duodenal bulb and periampullary duodenum. This was consistent with radiation effect, cannot rule out tumor ingrowth however. There was a discrete prepyloric ulcer, clean based but pretty deep (5-75mm across).  The previously placed plastic biliary stent was removed with a snare.  A 44 Autotome over a .035 hydrawire was used to cannulate the bile duct and contrast was injected. This revealed a tight, 4cm long mid bile duct stricture, the last 1cm of the CBD was normal. The bile duct proximal to the stricture was 10mm. The cytic duct partially opacified.  I could not place SEMS directly, instead the stricture required biliary dilation with a 4cm long 6mm diameter balloon and then an 8cm long 10mm diamteter, uncovered biliary stent was placed across the stricture in good position. The most distal 1cm of the stent extended into the duodenum. The main pancreatic duct was never cannulated or injected with dye.  The scope was then completely withdrawn from the patient and the procedure terminated.    COMPLICATIONS: none  ENDOSCOPIC IMPRESSION: 1.  Significant radiation related inflammation in distal stomach and duodenum. This includes a deep but clean based pre-pyloric ulcer. 2.  The previously placed biliary stent was removed and a permanent 8cm long 10mm diameter uncovered SEMS was placed after the stricture was dilated with biliary dilating balloon.  RECOMMENDATIONS: Follow clinically.  She is already taking twice daily PPI (Protonix).  I have called in a new prescription for carafate (1 dose twice daily).  I will communicate with Dr. Mitzi Hansen about considering a "holiday" from radiation to allow the deep pre pyloric ulcer some time to heal.  _______________________________ eSigned:  Rachael Fee, MD 10/21/2013 1:49 PM

## 2013-10-21 NOTE — Progress Notes (Signed)
Biliary stent removed by Dr. Christella Hartigan during pt's ERCP procedure/brb,rn

## 2013-10-21 NOTE — H&P (View-Only) (Signed)
HPI: This is a   very pleasant 57 year old woman who is here with her daughter today. I last saw her when she was hospitalized this past July for newly diagnosed pancreatic adenocarcinoma. Perform an ERCP and placed a 9 cm long plastic stent across a 4 cm distal biliary stricture. Brushings during that procedure confirmed adenocarcinoma. Imaging has suggested significant vascular involvement. 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 The celiac trunk may also be involved on imaging as well. She has undergone neoadjuvant chemoradiation. She said she's feeling fairly well, she does have some intermittent abdominal pains but is not even require narcotic pain medicines. Last liver tests were about a month ago and they were nearly normal.  Past Medical History  Diagnosis Date  . DM (diabetes mellitus), type 2     requires insulin  . Hypertension   . Depression   . Anxiety   . Hyperlipidemia   . Pancreatitis   . GERD (gastroesophageal reflux disease)   . Pancreatic cancer 05/2013    adenocarcinoma on ERCP/FNA  . Hearing impaired     lost the last of her hearing aids and cn not get another one.  hearing is better via right ear.   Marland Kitchen History of radiation therapy 07/20/13-08/09/13    Pancreas 37.5Gy    Past Surgical History  Procedure Laterality Date  . Appendectomy    . Ercp N/A 05/24/2013    Procedure: ENDOSCOPIC RETROGRADE CHOLANGIOPANCREATOGRAPHY (ERCP);  Surgeon: Rachael Fee, MD;  Location: [facility] OR;  Service: Endoscopy;  Laterality: N/A;  MAC vs. general per anesthesia   . Tubal ligation      Current Outpatient Prescriptions  Medication Sig Dispense Refill  . amLODipine (NORVASC) 10 MG tablet Take 1 tablet (10 mg total) by mouth daily.  30 tablet  0   . gabapentin (NEURONTIN) 300 MG capsule Take 300 mg by mouth 3 (three) times daily.      . hyaluronate sodium (RADIAPLEXRX) GEL Apply 1 application topically 2 (two) times daily.      Marland Kitchen HYDROmorphone (DILAUDID) 4 MG tablet Take 0.5-1 tablets (2-4 mg total) by mouth every 4 (four) hours as needed for pain.  40 tablet  0  . ibuprofen (ADVIL) 200 MG tablet Take 600 mg by mouth every 6 (six) hours as needed for pain.      Marland Kitchen insulin aspart (NOVOLOG) 100 UNIT/ML injection Inject 10 Units into the skin 3 (three) times daily with meals.      . insulin glargine (LANTUS) 100 UNIT/ML injection Inject 20 Units into the skin at bedtime.      Marland Kitchen lisinopril (PRINIVIL,ZESTRIL) 20 MG tablet Take 1 tablet (20 mg total) by mouth daily.  30 tablet  0  . ondansetron (ZOFRAN-ODT) 4 MG disintegrating tablet Take 4 mg by mouth every 8 (eight) hours as needed for nausea.      . pantoprazole (PROTONIX) 40 MG tablet Take 1 tablet (40 mg total) by mouth 2 (two) times daily.  60 tablet  1  . polyethylene glycol (MIRALAX / GLYCOLAX) packet Take 17 g by mouth daily as needed (constipation).      . potassium chloride (K-DUR,KLOR-CON) 10 MEQ tablet Take 10 mEq by mouth daily.      . simethicone (MYLICON)  125 MG chewable tablet Chew 125 mg by mouth every 6 (six) hours as needed for flatulence.      . simvastatin (ZOCOR) 10 MG tablet Take 10 mg by mouth at bedtime.      . traMADol (ULTRAM) 50 MG tablet Take 1 tablet (50 mg total) by mouth every 6 (six) hours as needed for pain.  30 tablet  0  . OxyCODONE (OXYCONTIN) 10 mg T12A 12 hr tablet Take 1 tablet (10 mg total) by mouth every 12 (twelve) hours.  60 tablet  0  . [DISCONTINUED] enoxaparin (LOVENOX) 80 MG/0.8ML injection Inject 0.7 mLs (70 mg total) into the skin every 12 (twelve) hours.  60 Syringe  1   No current facility-administered medications for this visit.    Allergies as of 09/24/2013 - Review Complete 09/24/2013  Allergen Reaction Noted  . Betadine [povidone  iodine] Itching and Rash 03/19/2013    Family History  Problem Relation Age of Onset  . Diabetes type II Mother   . Hypertension Mother   . Heart attack Father   . HIV/AIDS Brother     History   Social History  . Marital Status: Single    Spouse Name: N/A    Number of Children: 5  . Years of Education: N/A   Occupational History  . Not on file.   Social History Main Topics  . Smoking status: Former Smoker -- 0.50 packs/day for 42 years    Quit date: 05/28/2013  . Smokeless tobacco: Never Used  . Alcohol Use: No  . Drug Use: No  . Sexual Activity: Yes    Birth Control/ Protection: Post-menopausal   Other Topics Concern  . Not on file   Social History Narrative  . No narrative on file      Physical Exam: BP 122/74  Pulse 72  Ht 5' 2.25" (1.581 m)  Wt 149 lb 2 oz (67.643 kg)  BMI 27.06 kg/m2 Constitutional: generally well-appearing Psychiatric: alert and oriented x3 Abdomen: soft, nontender, nondistended, no obvious ascites, no peritoneal signs, normal bowel sounds     Assessment and plan: 57 y.o. female with  unresectable pancreatic adenocarcinoma  she has had plastic biliary stent in place for about 4 months now. I'm going to plan to remove this and replace it with a longer-term metal biliary stent. She does have some intermittent abdominal pains was asking for better pain medicine and Advil. I'm giving her a prescription for OxyContin 12 hour period 60 pills with 3 refills. She also get basic set of labs including CBC, complete metabolic profile and coags.

## 2013-10-21 NOTE — Anesthesia Postprocedure Evaluation (Signed)
  Anesthesia Post-op Note  Patient: [name] [name]  Procedure(s) Performed: Procedure(s) (LRB): ENDOSCOPIC RETROGRADE CHOLANGIOPANCREATOGRAPHY (ERCP) WITH PROPOFOL (N/A) BILIARY STENT PLACEMENT (N/A)  Patient Location: PACU  Anesthesia Type: MAC  Level of Consciousness: awake and alert   Airway and Oxygen Therapy: Patient Spontanous Breathing  Post-op Pain: mild  Post-op Assessment: Post-op Vital signs reviewed, Patient's Cardiovascular Status Stable, Respiratory Function Stable, Patent Airway and No signs of Nausea or vomiting  Last Vitals:  Filed Vitals:   10/21/13 1350  BP: 145/92  Pulse:   Temp:   Resp: 33    Post-op Vital Signs: stable   Complications: No apparent anesthesia complications

## 2013-10-21 NOTE — H&P (Signed)
Primary Care Physician:  Dorrene German, MD Primary Gastroenterologist:  Dr. Christella Hartigan  CHIEF COMPLAINT:  Abdominal pain post ERCP for stent placement for pancreatic cancer  HPI: [name] [name] is a 57 y.o. female who has locally advanced pancreatic cancer. She previously had ERCP by Dr. Christella Hartigan in July of this year at which time she had a plastic stent placed.  Since that time she has undergone chemo and radiation therapy. She came in today for outpatient ERCP with removal of the plastic stent and placement of a permanent metal stent. Dr. Christella Hartigan said that the procedure went well except for her bile duct was tight and he had to pull to remove the plastic stent and then he had to stretch her bile duct as well in order to place a metal stent. The metal stent was eventually placed in position well with good drainage. Post procedure she was experiencing abdominal pain that was not controlled by a couple doses of fentanyl in the endoscopy unit. It was decided to admit her to the hospital for observation and pain control.  Currently she does admit to abdominal pain in her upper abdomen and says that it is worse than she remembers from her previous procedure. She is tearful. She denies any nausea currently. She is still somewhat sleepy from her procedure and anesthesia.     Past Medical History  Diagnosis Date  . DM (diabetes mellitus), type 2     requires insulin  . Hypertension   . Depression   . Anxiety   . Hyperlipidemia   . Pancreatitis   . GERD (gastroesophageal reflux disease)   . Pancreatic cancer 05/2013    adenocarcinoma on ERCP/FNA  . Hearing impaired     lost the last of her hearing aids and cn not get another one.  hearing is better via right ear.   Marland Kitchen History of radiation therapy 07/20/13-08/09/13    Pancreas 37.5Gy    Past Surgical History  Procedure Laterality Date  . Appendectomy    . Ercp N/A 05/24/2013    Procedure: ENDOSCOPIC RETROGRADE CHOLANGIOPANCREATOGRAPHY (ERCP);  Surgeon:  Rachael Fee, MD;  Location: [facility];  Service: Endoscopy;  Laterality: N/A;  MAC vs. general per anesthesia   . Tubal ligation      Prior to Admission medications   Medication Sig Start Date End Date Taking? Authorizing Provider  gabapentin (NEURONTIN) 300 MG capsule Take 300 mg by mouth 3 (three) times daily.   Yes Historical Provider, MD  HYDROmorphone (DILAUDID) 4 MG tablet Take 2-4 mg by mouth every 4 (four) hours as needed for moderate pain or severe pain. 07/15/13  Yes Rana Snare, NP  ibuprofen (ADVIL) 200 MG tablet Take 600 mg by mouth every 6 (six) hours as needed for mild pain or moderate pain.    Yes Historical Provider, MD  insulin aspart (NOVOLOG) 100 UNIT/ML injection Inject 10 Units into the skin 3 (three) times daily with meals.   Yes Historical Provider, MD  insulin glargine (LANTUS) 100 UNIT/ML injection Inject 30 Units into the skin at bedtime.    Yes Historical Provider, MD  lisinopril (PRINIVIL,ZESTRIL) 20 MG tablet Take 20 mg by mouth daily with breakfast. 07/15/13  Yes Rana Snare, NP  metoprolol tartrate (LOPRESSOR) 25 MG tablet Take 1/2 tablet twice a day 10/12/13  Yes Lars Masson, MD  pantoprazole (PROTONIX) 40 MG tablet Take 40 mg by mouth 2 (two) times daily. 08/15/13  Yes Jeralyn Bennett, MD  polyethylene glycol (MIRALAX / Ethelene Hal)  packet Take 17 g by mouth daily as needed for mild constipation or moderate constipation (constipation).    Yes Historical Provider, MD  potassium chloride (K-DUR,KLOR-CON) 10 MEQ tablet Take 10 mEq by mouth daily.   Yes Historical Provider, MD  OxyCODONE (OXYCONTIN) 10 mg T12A 12 hr tablet Take 10 mg by mouth every 12 (twelve) hours. 09/24/13   Rachael Fee, MD  simethicone (MYLICON) 125 MG chewable tablet Chew 125 mg by mouth every 6 (six) hours as needed for flatulence.    Historical Provider, MD  simvastatin (ZOCOR) 10 MG tablet Take 10 mg by mouth at bedtime.    Historical Provider, MD  sucralfate (CARAFATE) 1 GM/10ML  suspension Take 10 mLs (1 g total) by mouth 2 (two) times daily. 10/21/13   Rachael Fee, MD  traMADol (ULTRAM) 50 MG tablet Take 50 mg by mouth every 6 (six) hours as needed for pain. 08/17/13   Rana Snare, NP    Current Facility-Administered Medications  Medication Dose Route Frequency Provider Last Rate Last Dose  . 0.45 % NaCl with KCl 20 mEq / L infusion   Intravenous Continuous Johniya Durfee D. Alvey Brockel, PA-C      . 0.9 %  sodium chloride infusion   Intravenous Continuous Rachael Fee, MD      . 0.9 %  sodium chloride infusion  250 mL Intravenous PRN Shanda Bumps D. Renita Brocks, PA-C      . acetaminophen (TYLENOL) tablet 650 mg  650 mg Oral Q6H PRN Princella Pellegrini. Shailah Gibbins, PA-C       Or  . acetaminophen (TYLENOL) suppository 650 mg  650 mg Rectal Q6H PRN Princella Pellegrini. Seanna Sisler, PA-C      . ciprofloxacin (CIPRO) IVPB 400 mg  400 mg Intravenous Q12H Rachael Fee, MD   400 mg at 10/21/13 1220  . gabapentin (NEURONTIN) capsule 300 mg  300 mg Oral TID Princella Pellegrini. Michaell Grider, PA-C      . HYDROmorphone (DILAUDID) injection 0.5 mg  0.5 mg Intravenous Q2H PRN Arjun Hard D. Chanell Nadeau, PA-C      . lactated ringers infusion   Intravenous Continuous Rachael Fee, MD 20 mL/hr at 10/21/13 1208    . [START ON 10/22/2013] lisinopril (PRINIVIL,ZESTRIL) tablet 20 mg  20 mg Oral Q breakfast Kolton Kienle D. Lania Zawistowski, PA-C      . metoprolol tartrate (LOPRESSOR) tablet 12.5 mg  12.5 mg Oral BID Shanda Bumps D. Karion Cudd, PA-C      . ondansetron (ZOFRAN) tablet 4 mg  4 mg Oral Q6H PRN Princella Pellegrini. Jennetta Flood, PA-C       Or  . ondansetron (ZOFRAN) injection 4 mg  4 mg Intravenous Q6H PRN Alonzo Owczarzak D. Taylar Hartsough, PA-C      . pantoprazole (PROTONIX) EC tablet 40 mg  40 mg Oral BID Jaylan Duggar D. Jameca Chumley, PA-C      . polyethylene glycol (MIRALAX / GLYCOLAX) packet 17 g  17 g Oral Daily PRN Princella Pellegrini. Imraan Wendell, PA-C      . promethazine (PHENERGAN) injection 6.25-12.5 mg  6.25-12.5 mg Intravenous Q15 min PRN Eilene Ghazi, MD      . simvastatin (ZOCOR) tablet 10 mg  10 mg Oral QHS Linnae Rasool D. Shanigua Gibb, PA-C       . sodium chloride 0.9 % injection 3 mL  3 mL Intravenous Q12H Koron Godeaux D. Quetzaly Ebner, PA-C      . sodium chloride 0.9 % injection 3 mL  3 mL Intravenous PRN Mariska Daffin D. Shaka Cardin, PA-C        Allergies as  of 09/24/2013 - Review Complete 09/24/2013  Allergen Reaction Noted  . Betadine [povidone iodine] Itching and Rash 03/19/2013    Family History  Problem Relation Age of Onset  . Diabetes type II Mother   . Hypertension Mother   . Heart attack Father   . HIV/AIDS Brother     History   Social History  . Marital Status: Single    Spouse Name: N/A    Number of Children: 5  . Years of Education: N/A   Occupational History  . Not on file.   Social History Main Topics  . Smoking status: Former Smoker -- 0.50 packs/day for 42 years    Quit date: 05/28/2013  . Smokeless tobacco: Never Used  . Alcohol Use: No  . Drug Use: No  . Sexual Activity: Yes    Birth Control/ Protection: Post-menopausal   Other Topics Concern  . Not on file   Social History Narrative  . No narrative on file    Review of Systems: Ten point ROS is  O/W negative except as mentioned in HPI.  Physical Exam: Vital signs in last 24 hours: Temp:  [97.7 F (36.5 C)-98.7 F (37.1 C)] 97.7 F (36.5 C) (12/04 1345) Pulse Rate:  [63] 63 (12/04 1149) Resp:  [11-33] 29 (12/04 1445) BP: (118-173)/(81-113) 152/113 mmHg (12/04 1445) SpO2:  [95 %-99 %] 95 % (12/04 1445)   General:   Alert, but still sleepy from anesthesia.  Well developed, well-nourished, pleasant and cooperative in NAD Head:  Normocephalic and atraumatic. Eyes:  Sclera clear, no icterus.  Conjunctiva pink. Ears:  Slightly HOH. Mouth:  No deformity or lesions.  Oropharynx pink & moist. Lungs:  Clear throughout to auscultation.  No wheezes, crackles, or rhonchi. No acute distress. Heart:  Regular rate and rhythm; no murmurs, clicks, rubs, or gallops. Abdomen:  Soft, non-distended.  BS present.  TTP in epigastrium without R/R/G. Msk:  Symmetrical  without gross deformities. Normal posture. Pulses:  Normal pulses noted. Extremities:  Without clubbing or edema. Neurologic:  Alert and  oriented x4;  grossly normal neurologically. Skin:  Intact without significant lesions or rashes. Psych:  Alert and cooperative. Normal mood and affect.  Intake/Output this shift: Total I/O In: 1250 [I.V.:1250] Out: -   Impression / Plan: -Locally advanced pancreatic cancer s/p metal stent placement via ERCP today, 12/4. -IDDM -Anxiety and depression -Hyperlipidemia  *We are admitting patient for observation and pain control.  She will have clear liquids if tolerated.  Check amylase, lipase, and hepatic function panel in AM.  No imaging unless pain fails to improve.  Will give SSI with CBG monitoring.    LOS: 0 days   Kyndall Chaplin D.  10/21/2013, 2:57 PM

## 2013-10-21 NOTE — Transfer of Care (Signed)
Immediate Anesthesia Transfer of Care Note  Patient: [name] [name]  Procedure(s) Performed: Procedure(s) with comments: ENDOSCOPIC RETROGRADE CHOLANGIOPANCREATOGRAPHY (ERCP) WITH PROPOFOL (N/A) - Stent removal  BILIARY STENT PLACEMENT (N/A)  Patient Location: PACU  Anesthesia Type:MAC  Level of Consciousness: awake, sedated and patient cooperative  Airway & Oxygen Therapy: Patient Spontanous Breathing and Patient connected to face mask oxygen  Post-op Assessment: Report given to PACU RN and Post -op Vital signs reviewed and stable  Post vital signs: Reviewed and stable  Complications: No apparent anesthesia complications

## 2013-10-21 NOTE — Telephone Encounter (Signed)
I saw that.  Looks like she is cleared for ERCP

## 2013-10-21 NOTE — Anesthesia Preprocedure Evaluation (Addendum)
Anesthesia Evaluation  Patient identified by MRN, date of birth, ID band Patient awake    Reviewed: Allergy & Precautions, H&P , NPO status , Patient's Chart, lab work & pertinent test results  Airway Mallampati: II TM Distance: >3 FB Neck ROM: Full    Dental no notable dental hx.    Pulmonary shortness of breath and with exertion, former smoker,  breath sounds clear to auscultation  Pulmonary exam normal       Cardiovascular hypertension, Pt. on medications Rhythm:Regular Rate:Normal  Brugada bsyndrome  Overall Impression:  Normal study.    LV Ejection Fraction: 62%.  LV Wall Motion:  NL LV Function; NL Wall Motion      Neuro/Psych negative neurological ROS  negative psych ROS   GI/Hepatic negative GI ROS, Neg liver ROS,   Endo/Other  diabetes, Insulin Dependent  Renal/GU negative Renal ROS  negative genitourinary   Musculoskeletal negative musculoskeletal ROS (+)   Abdominal   Peds negative pediatric ROS (+)  Hematology negative hematology ROS (+)   Anesthesia Other Findings   Reproductive/Obstetrics negative OB ROS                          Anesthesia Physical Anesthesia Plan  ASA: III  Anesthesia Plan: MAC   Post-op Pain Management:    Induction: Intravenous  Airway Management Planned: Nasal Cannula  Additional Equipment:   Intra-op Plan:   Post-operative Plan:   Informed Consent: I have reviewed the patients History and Physical, chart, labs and discussed the procedure including the risks, benefits and alternatives for the proposed anesthesia with the patient or authorized representative who has indicated his/her understanding and acceptance.   Dental advisory given  Plan Discussed with: CRNA and Surgeon  Anesthesia Plan Comments:         Anesthesia Quick Evaluation

## 2013-10-21 NOTE — H&P (Signed)
________________________________________________________________________  [facility] MD note:  I personally examined the patient, reviewed the data and agree with the assessment and plan described above.   Elric Tirado, MD [facility] Pager [phone]  

## 2013-10-22 ENCOUNTER — Encounter (HOSPITAL_COMMUNITY): Payer: Self-pay | Admitting: Gastroenterology

## 2013-10-22 DIAGNOSIS — K831 Obstruction of bile duct: Secondary | ICD-10-CM

## 2013-10-22 DIAGNOSIS — R1013 Epigastric pain: Secondary | ICD-10-CM

## 2013-10-22 LAB — GLUCOSE, CAPILLARY
Glucose-Capillary: 168 mg/dL — ABNORMAL HIGH (ref 70–99)
Glucose-Capillary: 151 mg/dL — ABNORMAL HIGH (ref 70–99)

## 2013-10-22 NOTE — Progress Notes (Signed)
   Subjective  At 7.30 am pt appeared to be very sleepy/difficult to arouse, appeared comfortable    Objective   Vital signs in last 24 hours: Temp:  [97.7 F (36.5 C)-99.1 F (37.3 C)] 99.1 F (37.3 C) (12/05 0605) Pulse Rate:  [63-75] 75 (12/05 0605) Resp:  [6-33] 16 (12/05 0605) BP: (118-175)/(61-113) 175/72 mmHg (12/05 0605) SpO2:  [95 %-100 %] 98 % (12/05 0605) Weight:  [153 lb 3.5 oz (69.5 kg)-154 lb 1.6 oz (69.9 kg)] 153 lb 3.5 oz (69.5 kg) (12/05 0605) Last BM Date: 10/20/13 General:    white female in NAD Heart:  Regular rate and rhythm; no murmurs Lungs: Respirations even and unlabored, lungs CTA bilaterally Abdomen:  Soft, nontender and nondistended. Normal bowel sounds. Mild tenderness mostly in RUQ, no distention, Extremities:  Without edema. Neurologic: drowsy butoriented,  grossly normal neurologically. Psych:  Cooperative.   Intake/Output from previous day: 12/04 0701 - 12/05 0700 In: 1250 [I.V.:1250] Out: 1850 [Urine:1850] Intake/Output this shift:    Lab Results: No results found for this basename: WBC, HGB, HCT, PLT,  in the last 72 hours BMET No results found for this basename: NA, K, CL, CO2, GLUCOSE, BUN, CREATININE, CALCIUM,  in the last 72 hours LFT No results found for this basename: PROT, ALBUMIN, AST, ALT, ALKPHOS, BILITOT, BILIDIR, IBILI,  in the last 72 hours PT/INR No results found for this basename: LABPROT, INR,  in the last 72 hours  Studies/Results: Dg Ercp  10/21/2013   CLINICAL DATA:  ERCP with balloon dilatation and stent placement  EXAM: ERCP  TECHNIQUE: Multiple spot images obtained with the fluoroscopic device and submitted for interpretation post-procedure.  COMPARISON:  CT abdomen pelvis -08/12/2013  FLUOROSCOPY TIME:  8 min, 54 seconds  FINDINGS: 6 spot intraoperative radiographic images of the right upper abdominal quadrant during ERCP are provided for review.  Initial images demonstrate an ERCP probe overlying the right upper  abdominal quadrant. There has been apparent interval removal of previously noted plastic internal biliary stent. There is selective cannulation and opacification of the common bile duct which appears irregularly narrowed about its mid and distal aspect.  There is minimal opacification of the cystic duct and gallbladder. There is minimal opacification of the intrahepatic biliary system which appears mildly dilated centrally.  Subsequent images demonstrate placement of an internal patella biliary stent with superior end within the proper hepatic duct and distal end overlying the expected location of the duodenum.  IMPRESSION: ERCP demonstrates persistent irregular narrowing of the mid and distal aspects of the common bile duct with subsequent placement of an internal metallic biliary stent.  These images were submitted for radiologic interpretation only. Please see the procedural report for the amount of contrast and the fluoroscopy time utilized.   Electronically Signed   By: Simonne Come M.D.   On: 10/21/2013 16:05       Assessment / Plan:   Abdominal pain post ERCP and biliary stent replacement yesterday, appears to be more comfortable but drowsy from pain medications. We'll observe through this morning and decide in the afternoon if she can go home. She does not appear to be toxic. Clinically,there is no evidence of pancreatitis  Active Problems:   Biliary stricture   Pancreatic cancer     LOS: 1 day   Lina Sar  10/22/2013, 11:33 AM

## 2013-10-22 NOTE — Progress Notes (Signed)
UR completed 

## 2013-10-22 NOTE — Progress Notes (Signed)
Pt unable to go home due to  Oversedation, although  sha has not received any pain meds today. Will reassess in am.

## 2013-10-23 LAB — HEPATIC FUNCTION PANEL
ALT: 33 U/L (ref 0–35)
AST: 26 U/L (ref 0–37)
Alkaline Phosphatase: 200 U/L — ABNORMAL HIGH (ref 39–117)
Total Protein: 7.4 g/dL (ref 6.0–8.3)

## 2013-10-23 LAB — GLUCOSE, CAPILLARY
Glucose-Capillary: 138 mg/dL — ABNORMAL HIGH (ref 70–99)
Glucose-Capillary: 278 mg/dL — ABNORMAL HIGH (ref 70–99)

## 2013-10-23 LAB — LIPASE, BLOOD: Lipase: 11 U/L (ref 11–59)

## 2013-10-23 NOTE — Discharge Summary (Signed)
[facility] Discharge Summary  Name: [name] [name] MRN: [id number] DOB: [birth year] 57 y.o. PCP:  Dorrene German, MD  Date of Admission: 10/21/2013 11:19 AM Date of Discharge: 10/23/2013 Primary Gastroenterologist: Rob Bunting, MD Discharging Physician: Lina Sar, MD  Discharge Diagnosis: 1. Pancreatic cancer with biliary obstruction, s/p ERCP with metal stent placement this admission.    2. Diabetes, CBG 130-200 on SSI this admission  Consultations: none  Procedures Performed:  Dg Ercp  10/21/2013   CLINICAL DATA:  ERCP with balloon dilatation and stent placement  EXAM: ERCP  TECHNIQUE: Multiple spot images obtained with the fluoroscopic device and submitted for interpretation post-procedure.  COMPARISON:  CT abdomen pelvis -08/12/2013  FLUOROSCOPY TIME:  8 min, 54 seconds  FINDINGS: 6 spot intraoperative radiographic images of the right upper abdominal quadrant during ERCP are provided for review.  Initial images demonstrate an ERCP probe overlying the right upper abdominal quadrant. There has been apparent interval removal of previously noted plastic internal biliary stent. There is selective cannulation and opacification of the common bile duct which appears irregularly narrowed about its mid and distal aspect.  There is minimal opacification of the cystic duct and gallbladder. There is minimal opacification of the intrahepatic biliary system which appears mildly dilated centrally.  Subsequent images demonstrate placement of an internal patella biliary stent with superior end within the proper hepatic duct and distal end overlying the expected location of the duodenum.  IMPRESSION: ERCP demonstrates persistent irregular narrowing of the mid and distal aspects of the common bile duct with subsequent placement of an internal metallic biliary stent.  These images were submitted for radiologic interpretation only. Please see the procedural report for the amount of contrast and the  fluoroscopy time utilized.   Electronically Signed   By: Simonne Come M.D.   On: 10/21/2013 16:05   History/Physical Exam:  See Admission H&P  Admission HPI: Per Doug Sou, P.A.  [name] [name] is a 57 y.o. female who has locally advanced pancreatic cancer. She previously had ERCP by Dr. Christella Hartigan in July of this year at which time she had a plastic stent placed. Since that time she has undergone chemo and radiation therapy. She came in today for outpatient ERCP with removal of the plastic stent and placement of a permanent metal stent. Dr. Christella Hartigan said that the procedure went well except for her bile duct was tight and he had to pull to remove the plastic stent and then he had to stretch her bile duct as well in order to place a metal stent. The metal stent was eventually placed in position well with good drainage. Post procedure she was experiencing abdominal pain that was not controlled by a couple doses of fentanyl in the endoscopy unit. It was decided to admit her to the hospital for observation and pain control.  Currently she does admit to abdominal pain in her upper abdomen and says that it is worse than she remembers from her previous procedure. She is tearful. She denies any nausea currently. She is still somewhat sleepy from her procedure and anesthesia.    Hospital Course by problem list: 1. Pancreatic cancer with biliary obstruction, s/p ERCP removal of previously placed plastic stent and placement of a metal stent. Post procedure patient complained of abdominal pain. She was admitted for observation. The following day patient was very drowsy upon rounds. She complained of RUQ discomfort. We extended her stay another night. The following day her mental status had improved, she wanted to go home.  2. Diabetes. Patient on sliding scale insulin during her stay. CBG ranged 130s-160s.   Discharge Vitals:  BP 146/62  Pulse 66  Temp(Src) 98.3 F (36.8 C) (Oral)  Resp 16  Ht 5' 2.5" (1.588 m)   Wt 153 lb 3.5 oz (69.5 kg)  BMI 27.56 kg/m2  SpO2 97%  Physical Exam General: Awoken from sleep. Cardiac: Regular rate and rhythm Pulmonary: Lungs CTA bilaterally Abdominal: Soft, non-distended, mild to moderate RUQ tenderness, active bowel sounds.  Extremity: no edema Neuro: alert and oriented once adequately aroused from rest Psych: cooperative, slightly agitated  Discharge Labs:  Results for orders placed during the hospital encounter of 10/21/13 (from the past 24 hour(s))  GLUCOSE, CAPILLARY     Status: Abnormal   Collection Time    10/22/13 11:56 AM      Result Value Range   Glucose-Capillary 139 (*) 70 - 99 mg/dL  GLUCOSE, CAPILLARY     Status: Abnormal   Collection Time    10/22/13  4:31 PM      Result Value Range   Glucose-Capillary 151 (*) 70 - 99 mg/dL   Comment 1 Notify RN    GLUCOSE, CAPILLARY     Status: Abnormal   Collection Time    10/22/13  9:12 PM      Result Value Range   Glucose-Capillary 149 (*) 70 - 99 mg/dL   Comment 1 Notify RN    AMYLASE     Status: None   Collection Time    10/23/13  4:53 AM      Result Value Range   Amylase 51  0 - 105 U/L  LIPASE, BLOOD     Status: None   Collection Time    10/23/13  4:53 AM      Result Value Range   Lipase 11  11 - 59 U/L  HEPATIC FUNCTION PANEL     Status: Abnormal   Collection Time    10/23/13  4:53 AM      Result Value Range   Total Protein 7.4  6.0 - 8.3 g/dL   Albumin 3.3 (*) 3.5 - 5.2 g/dL   AST 26  0 - 37 U/L   ALT 33  0 - 35 U/L   Alkaline Phosphatase 200 (*) 39 - 117 U/L   Total Bilirubin 0.4  0.3 - 1.2 mg/dL   Bilirubin, Direct <1.6  0.0 - 0.3 mg/dL   Indirect Bilirubin NOT CALCULATED  0.3 - 0.9 mg/dL  GLUCOSE, CAPILLARY     Status: Abnormal   Collection Time    10/23/13  7:59 AM      Result Value Range   Glucose-Capillary 138 (*) 70 - 99 mg/dL    Disposition and follow-up:   [name] [name] was discharged from [facility] in stable condition.    Follow-up  Appointments: Discharge Orders   Future Appointments Provider Department Dept Phone   10/25/2013 11:00 AM Ladene Artist, MD [facility] MEDICAL ONCOLOGY [phone]   11/01/2013 10:30 AM [facility] Echo Echo 1 [facility] SITE 3 ECHO LAB [phone]   Future Orders Complete By Expires   Call MD for:  temperature >100.5  As directed    Driving Restrictions  As directed    Comments:     Do not drive or operate machinery while taking pain medication   Resume previous diet  As directed       Discharge Medications:   Medication List  ADVIL 200 MG tablet  Generic drug:  ibuprofen  Take 600 mg by mouth every 6 (six) hours as needed for mild pain or moderate pain.     gabapentin 300 MG capsule  Commonly known as:  NEURONTIN  Take 300 mg by mouth 3 (three) times daily.     HYDROmorphone 4 MG tablet  Commonly known as:  DILAUDID  Take 2-4 mg by mouth every 4 (four) hours as needed for moderate pain or severe pain.     insulin aspart 100 UNIT/ML injection  Commonly known as:  novoLOG  Inject 10 Units into the skin 3 (three) times daily with meals.     insulin glargine 100 UNIT/ML injection  Commonly known as:  LANTUS  Inject 30 Units into the skin at bedtime.     lisinopril 20 MG tablet  Commonly known as:  PRINIVIL,ZESTRIL  Take 20 mg by mouth daily with breakfast.     metoprolol tartrate 25 MG tablet  Commonly known as:  LOPRESSOR  Take 1/2 tablet twice a day     OxyCODONE 10 mg T12a 12 hr tablet  Commonly known as:  OXYCONTIN  Take 10 mg by mouth every 12 (twelve) hours.     pantoprazole 40 MG tablet  Commonly known as:  PROTONIX  Take 40 mg by mouth 2 (two) times daily.     polyethylene glycol packet  Commonly known as:  MIRALAX / GLYCOLAX  Take 17 g by mouth daily as needed for mild constipation or moderate constipation (constipation).     potassium chloride 10 MEQ tablet  Commonly known as:  K-DUR,KLOR-CON  Take 10 mEq by  mouth daily.     simethicone 125 MG chewable tablet  Commonly known as:  MYLICON  Chew 125 mg by mouth every 6 (six) hours as needed for flatulence.     simvastatin 10 MG tablet  Commonly known as:  ZOCOR  Take 10 mg by mouth at bedtime.     sucralfate 1 GM/10ML suspension  Commonly known as:  CARAFATE  Take 10 mLs (1 g total) by mouth 2 (two) times daily.     traMADol 50 MG tablet  Commonly known as:  ULTRAM  Take 50 mg by mouth every 6 (six) hours as needed for pain.        Signed: Willette Cluster 10/23/2013, 9:32 AM

## 2013-10-23 NOTE — Progress Notes (Signed)
Utilization Review completed.  

## 2013-10-24 NOTE — Progress Notes (Signed)
10/24/2013 1730 No NCM needs identified. Isidoro Donning RN CCM Case Mgmt phone [phone]

## 2013-10-25 ENCOUNTER — Ambulatory Visit: Payer: Medicaid Other | Admitting: Oncology

## 2013-10-26 ENCOUNTER — Telehealth: Payer: Self-pay | Admitting: Oncology

## 2013-10-26 NOTE — Telephone Encounter (Signed)
Pt called and r/s appt to [facility] , pt FTKA 12/5, nurse notified

## 2013-11-01 ENCOUNTER — Encounter: Payer: Self-pay | Admitting: Cardiovascular Disease

## 2013-11-01 ENCOUNTER — Emergency Department (HOSPITAL_COMMUNITY): Payer: Medicaid Other

## 2013-11-01 ENCOUNTER — Other Ambulatory Visit: Payer: Self-pay

## 2013-11-01 ENCOUNTER — Emergency Department (HOSPITAL_COMMUNITY)
Admission: EM | Admit: 2013-11-01 | Discharge: 2013-11-01 | Disposition: A | Discharge status: Home / Self Care | Payer: Medicaid Other | Attending: Emergency Medicine | Admitting: Emergency Medicine

## 2013-11-01 ENCOUNTER — Ambulatory Visit (HOSPITAL_BASED_OUTPATIENT_CLINIC_OR_DEPARTMENT_OTHER): Payer: Medicaid Other | Admitting: Radiology

## 2013-11-01 ENCOUNTER — Encounter (HOSPITAL_COMMUNITY): Payer: Self-pay | Admitting: Emergency Medicine

## 2013-11-01 DIAGNOSIS — F3289 Other specified depressive episodes: Secondary | ICD-10-CM | POA: Insufficient documentation

## 2013-11-01 DIAGNOSIS — R109 Unspecified abdominal pain: Secondary | ICD-10-CM

## 2013-11-01 DIAGNOSIS — Z8509 Personal history of malignant neoplasm of other digestive organs: Secondary | ICD-10-CM | POA: Insufficient documentation

## 2013-11-01 DIAGNOSIS — E785 Hyperlipidemia, unspecified: Secondary | ICD-10-CM | POA: Insufficient documentation

## 2013-11-01 DIAGNOSIS — Z87891 Personal history of nicotine dependence: Secondary | ICD-10-CM | POA: Insufficient documentation

## 2013-11-01 DIAGNOSIS — Z923 Personal history of irradiation: Secondary | ICD-10-CM | POA: Insufficient documentation

## 2013-11-01 DIAGNOSIS — I10 Essential (primary) hypertension: Secondary | ICD-10-CM | POA: Insufficient documentation

## 2013-11-01 DIAGNOSIS — K219 Gastro-esophageal reflux disease without esophagitis: Secondary | ICD-10-CM | POA: Insufficient documentation

## 2013-11-01 DIAGNOSIS — M545 Low back pain, unspecified: Secondary | ICD-10-CM | POA: Insufficient documentation

## 2013-11-01 DIAGNOSIS — Z79899 Other long term (current) drug therapy: Secondary | ICD-10-CM | POA: Insufficient documentation

## 2013-11-01 DIAGNOSIS — G8929 Other chronic pain: Secondary | ICD-10-CM | POA: Insufficient documentation

## 2013-11-01 DIAGNOSIS — R079 Chest pain, unspecified: Secondary | ICD-10-CM

## 2013-11-01 DIAGNOSIS — R1084 Generalized abdominal pain: Secondary | ICD-10-CM | POA: Insufficient documentation

## 2013-11-01 DIAGNOSIS — Z794 Long term (current) use of insulin: Secondary | ICD-10-CM | POA: Insufficient documentation

## 2013-11-01 DIAGNOSIS — R072 Precordial pain: Secondary | ICD-10-CM

## 2013-11-01 DIAGNOSIS — E119 Type 2 diabetes mellitus without complications: Secondary | ICD-10-CM | POA: Insufficient documentation

## 2013-11-01 DIAGNOSIS — F411 Generalized anxiety disorder: Secondary | ICD-10-CM | POA: Insufficient documentation

## 2013-11-01 DIAGNOSIS — F329 Major depressive disorder, single episode, unspecified: Secondary | ICD-10-CM | POA: Insufficient documentation

## 2013-11-01 HISTORY — DX: Other intervertebral disc degeneration, lumbar region without mention of lumbar back pain or lower extremity pain: M51.369

## 2013-11-01 HISTORY — DX: Dorsalgia, unspecified: M54.9

## 2013-11-01 HISTORY — DX: Other chronic pain: G89.29

## 2013-11-01 HISTORY — DX: Other intervertebral disc degeneration, lumbar region: M51.36

## 2013-11-01 LAB — CBC WITH DIFFERENTIAL/PLATELET
Basophils Absolute: 0 10*3/uL (ref 0.0–0.1)
Eosinophils Absolute: 0.2 10*3/uL (ref 0.0–0.7)
HCT: 31.5 % — ABNORMAL LOW (ref 36.0–46.0)
Hemoglobin: 10.3 g/dL — ABNORMAL LOW (ref 12.0–15.0)
Lymphocytes Relative: 8 % — ABNORMAL LOW (ref 12–46)
MCHC: 32.7 g/dL (ref 30.0–36.0)
Monocytes Absolute: 0.6 10*3/uL (ref 0.1–1.0)
Monocytes Relative: 10 % (ref 3–12)
Neutro Abs: 5 10*3/uL (ref 1.7–7.7)
Neutrophils Relative %: 79 % — ABNORMAL HIGH (ref 43–77)
Platelets: 154 10*3/uL (ref 150–400)
RDW: 14.1 % (ref 11.5–15.5)
WBC: 6.3 10*3/uL (ref 4.0–10.5)

## 2013-11-01 LAB — URINALYSIS W MICROSCOPIC + REFLEX CULTURE
Bilirubin Urine: NEGATIVE
Glucose, UA: 250 mg/dL — AB
Hgb urine dipstick: NEGATIVE
Nitrite: NEGATIVE
Protein, ur: NEGATIVE mg/dL
Specific Gravity, Urine: 1.037 — ABNORMAL HIGH (ref 1.005–1.030)
Urobilinogen, UA: 0.2 mg/dL (ref 0.0–1.0)

## 2013-11-01 LAB — COMPREHENSIVE METABOLIC PANEL
ALT: 18 U/L (ref 0–35)
AST: 18 U/L (ref 0–37)
Alkaline Phosphatase: 161 U/L — ABNORMAL HIGH (ref 39–117)
CO2: 26 mEq/L (ref 19–32)
Chloride: 102 mEq/L (ref 96–112)
Creatinine, Ser: 0.59 mg/dL (ref 0.50–1.10)
GFR calc non Af Amer: >90 mL/min (ref >90)
Potassium: 3.7 mEq/L (ref 3.5–5.1)
Sodium: 137 mEq/L (ref 135–145)
Total Bilirubin: 0.3 mg/dL (ref 0.3–1.2)

## 2013-11-01 LAB — TROPONIN I: Troponin I: <0.3 ng/mL (ref <0.30)

## 2013-11-01 LAB — LIPASE, BLOOD: Lipase: 10 U/L — ABNORMAL LOW (ref 11–59)

## 2013-11-01 MED ORDER — OXYCODONE-ACETAMINOPHEN 5-325 MG PO TABS: ORAL_TABLET | ORAL | Status: DC

## 2013-11-01 MED ORDER — IOHEXOL 300 MG/ML  SOLN
80.0000 mL | Freq: Once | INTRAMUSCULAR | Status: AC | PRN
  Administered 2013-11-01: 80 mL via INTRAVENOUS

## 2013-11-01 MED ORDER — MORPHINE SULFATE 4 MG/ML IJ SOLN
4.0000 mg | INTRAMUSCULAR | Status: DC | PRN
  Administered 2013-11-01: 4 mg via INTRAVENOUS
  Filled 2013-11-01: qty 1

## 2013-11-01 MED ORDER — IOHEXOL 300 MG/ML  SOLN: 25.0000 mL | INTRAMUSCULAR | Status: AC

## 2013-11-01 MED ORDER — FENTANYL CITRATE 0.05 MG/ML IJ SOLN
50.0000 ug | INTRAMUSCULAR | Status: DC | PRN
  Administered 2013-11-01: 50 ug via INTRAVENOUS
  Filled 2013-11-01: qty 2

## 2013-11-01 MED ORDER — ONDANSETRON HCL 4 MG/2ML IJ SOLN
4.0000 mg | INTRAMUSCULAR | Status: DC | PRN
  Administered 2013-11-01: 4 mg via INTRAVENOUS
  Filled 2013-11-01: qty 2

## 2013-11-01 MED ORDER — SODIUM CHLORIDE 0.9 % IV SOLN
INTRAVENOUS | Status: DC
  Administered 2013-11-01: 12:00:00 via INTRAVENOUS

## 2013-11-01 NOTE — ED Notes (Signed)
RN entered room and explained to pt need for IV. Pt behaving inappropriately to staff sts "these f-ing people don't know what they are doing and you dont understand my pain and are just trying to mask it" RN explained role of ER and what has been ordered for patient to help provider diagnose pt. Pt sts "I dont need you guys to f-ing get smart with me you dont f-ing understand this sh-t." RN and daughter attempting to calm pt down with some success.

## 2013-11-01 NOTE — ED Notes (Signed)
Pt c/o generalized body aches x3-4 days. Denies fevers, n/v/d. sts has pain under right breast into abdomen, back and down bilateral legs. Pt sts "cant sleep no more" and sts she has taken "1 pill of hydromorphone" last night.

## 2013-11-01 NOTE — ED Notes (Signed)
X-ray called to let them know pt is now ready for transport

## 2013-11-01 NOTE — ED Provider Notes (Signed)
CSN: [id number]     Arrival date & time 11/01/13  1119 History   First MD Initiated Contact with Patient 11/01/13 1132     Chief Complaint  Patient presents with  . Generalized Body Aches    HPI Pt was seen at 1140.  Per pt, c/o gradual onset and persistence of constant generalized abd "pain" for the past 3 to 4 days.  Has been associated with "pain all over my body."  Describes the abd pain as "aching."  Took one dose of her PO dilaudid last night without relief. Denies N/V/D, no fevers, no rash, no CP/SOB, no black or blood in stools.       Past Medical History  Diagnosis Date  . DM (diabetes mellitus), type 2     requires insulin  . Hypertension   . Depression   . Anxiety   . Hyperlipidemia   . Pancreatitis   . GERD (gastroesophageal reflux disease)   . Pancreatic cancer 05/2013    adenocarcinoma on ERCP/FNA  . Hearing impaired     lost the last of her hearing aids and cn not get another one.  hearing is better via right ear.   Marland Kitchen History of radiation therapy 07/20/13-08/09/13    Pancreas 37.5Gy  . DDD (degenerative disc disease), lumbar   . Chronic back pain    Past Surgical History  Procedure Laterality Date  . Appendectomy    . Ercp N/A 05/24/2013    Procedure: ENDOSCOPIC RETROGRADE CHOLANGIOPANCREATOGRAPHY (ERCP);  Surgeon: Rachael Fee, MD;  Location: [facility] OR;  Service: Endoscopy;  Laterality: N/A;  MAC vs. general per anesthesia   . Tubal ligation    . Endoscopic retrograde cholangiopancreatography (ercp) with propofol N/A 10/21/2013    Procedure: ENDOSCOPIC RETROGRADE CHOLANGIOPANCREATOGRAPHY (ERCP) WITH PROPOFOL;  Surgeon: Rachael Fee, MD;  Location: [facility] ENDOSCOPY;  Service: Endoscopy;  Laterality: N/A;  Stent removal   . Biliary stent placement N/A 10/21/2013    Procedure: BILIARY STENT PLACEMENT;  Surgeon: Rachael Fee, MD;  Location: [facility] ENDOSCOPY;  Service: Endoscopy;  Laterality: N/A;   Family History  Problem Relation Age of Onset  . Diabetes type II Mother    . Hypertension Mother   . Heart attack Father   . HIV/AIDS Brother    History  Substance Use Topics  . Smoking status: Former Smoker -- 0.50 packs/day for 42 years    Quit date: 05/28/2013  . Smokeless tobacco: Never Used  . Alcohol Use: No    Review of Systems ROS: Statement: All systems negative except as marked or noted in the HPI; Constitutional: Negative for fever and chills. +generalized body aches.; ; Eyes: Negative for eye pain, redness and discharge. ; ; ENMT: Negative for ear pain, hoarseness, nasal congestion, sinus pressure and sore throat. ; ; Cardiovascular: Negative for chest pain, palpitations, diaphoresis, dyspnea and peripheral edema. ; ; Respiratory: Negative for cough, wheezing and stridor. ; ; Gastrointestinal: +abd pain. Negative for nausea, vomiting, diarrhea, blood in stool, hematemesis, jaundice and rectal bleeding. . ; ; Genitourinary: Negative for dysuria, flank pain and hematuria. ; ; Musculoskeletal: Negative for back pain and neck pain. Negative for swelling and trauma.; ; Skin: Negative for pruritus, rash, abrasions, blisters, bruising and skin lesion.; ; Neuro: Negative for headache, lightheadedness and neck stiffness. Negative for weakness, altered level of consciousness , altered mental status, extremity weakness, paresthesias, involuntary movement, seizure and syncope.       Allergies  Betadine  Home Medications   Current Outpatient Rx  Name  Route  Sig  Dispense  Refill  . amLODipine (NORVASC) 10 MG tablet   Oral   Take 10 mg by mouth daily.         Marland Kitchen gabapentin (NEURONTIN) 300 MG capsule   Oral   Take 300 mg by mouth 3 (three) times daily.         Marland Kitchen HYDROmorphone (DILAUDID) 4 MG tablet   Oral   Take 2-4 mg by mouth every 4 (four) hours as needed for moderate pain or severe pain.         Marland Kitchen ibuprofen (ADVIL) 200 MG tablet   Oral   Take 800 mg by mouth every 6 (six) hours as needed for mild pain or moderate pain.          Marland Kitchen insulin  aspart (NOVOLOG) 100 UNIT/ML injection   Subcutaneous   Inject 10 Units into the skin 3 (three) times daily with meals.         . insulin glargine (LANTUS) 100 UNIT/ML injection   Subcutaneous   Inject 30 Units into the skin at bedtime.          Marland Kitchen lisinopril (PRINIVIL,ZESTRIL) 20 MG tablet   Oral   Take 20 mg by mouth daily.          . metoprolol tartrate (LOPRESSOR) 25 MG tablet   Oral   Take 12.5 mg by mouth 2 (two) times daily.          . OxyCODONE (OXYCONTIN) 10 mg T12A 12 hr tablet   Oral   Take 10 mg by mouth 2 (two) times daily as needed (pain).          . pantoprazole (PROTONIX) 40 MG tablet   Oral   Take 40 mg by mouth 2 (two) times daily.         . polyethylene glycol (MIRALAX / GLYCOLAX) packet   Oral   Take 17 g by mouth daily as needed for mild constipation or moderate constipation (constipation).          . potassium chloride (K-DUR,KLOR-CON) 10 MEQ tablet   Oral   Take 10 mEq by mouth daily.         . simethicone (MYLICON) 125 MG chewable tablet   Oral   Chew 125 mg by mouth every 6 (six) hours as needed for flatulence.         . simvastatin (ZOCOR) 10 MG tablet   Oral   Take 10 mg by mouth at bedtime.         . sucralfate (CARAFATE) 1 GM/10ML suspension   Oral   Take 10 mLs (1 g total) by mouth 2 (two) times daily.   420 mL   11   . traMADol (ULTRAM) 50 MG tablet   Oral   Take 50 mg by mouth every 6 (six) hours as needed for moderate pain.           BP 134/64  Pulse 68  Temp(Src) 98.2 F (36.8 C) (Oral)  Resp 16  Ht 5' 2.5" (1.588 m)  Wt 149 lb (67.586 kg)  BMI 26.80 kg/m2  SpO2 100% Physical Exam 1145: Physical examination:  Nursing notes reviewed; Vital signs and O2 SAT reviewed;  Constitutional: Well developed, Well nourished, Well hydrated, In no acute distress; Head:  Normocephalic, atraumatic; Eyes: EOMI, PERRL, No scleral icterus; ENMT: Mouth and pharynx normal, Mucous membranes moist; Neck: Supple, Full range  of motion, No lymphadenopathy; Cardiovascular: Regular rate and rhythm, No gallop;  Respiratory: Breath sounds clear & equal bilaterally, No wheezes.  Speaking full sentences with ease, Normal respiratory effort/excursion; Chest: Nontender, Movement normal; Abdomen: Soft, +mild diffuse tenderness to palp. No rebound or guarding. Nondistended, Normal bowel sounds; Genitourinary: No CVA tenderness; Spine:  No midline CS, TS, LS tenderness. +TTP left lumbar paraspinal muscles;; Extremities: Pulses normal, No tenderness, No edema, No calf edema or asymmetry.; Neuro: AA&Ox3, Major CN grossly intact.  Speech clear. No gross focal motor or sensory deficits in extremities.; Skin: Color normal, Warm, Dry.   ED Course  Procedures   EKG Interpretation   None       MDM  MDM Reviewed: previous chart, nursing note and vitals Reviewed previous: labs and ECG Interpretation: labs, ECG, x-ray and CT scan      Date: 11/01/2013  Rate: 67  Rhythm: normal sinus rhythm  QRS Axis: normal  Intervals: normal  ST/T Wave abnormalities: normal  Conduction Disutrbances:none  Narrative Interpretation:   Old EKG Reviewed: changes noted; NS STTW changes on EKG dated 05/22/2013 are no longer present.    Results for orders placed during the hospital encounter of 11/01/13  URINALYSIS W MICROSCOPIC + REFLEX CULTURE      Result Value Range   Color, Urine YELLOW  YELLOW   APPearance HAZY (*) CLEAR   Specific Gravity, Urine 1.037 (*) 1.005 - 1.030   pH 5.5  5.0 - 8.0   Glucose, UA 250 (*) NEGATIVE mg/dL   Hgb urine dipstick NEGATIVE  NEGATIVE   Bilirubin Urine NEGATIVE  NEGATIVE   Ketones, ur NEGATIVE  NEGATIVE mg/dL   Protein, ur NEGATIVE  NEGATIVE mg/dL   Urobilinogen, UA 0.2  0.0 - 1.0 mg/dL   Nitrite NEGATIVE  NEGATIVE   Leukocytes, UA NEGATIVE  NEGATIVE   WBC, UA 0-2  <3 WBC/hpf   RBC / HPF 0-2  <3 RBC/hpf   Bacteria, UA MANY (*) RARE   Squamous Epithelial / LPF FEW (*) RARE   Urine-Other MUCOUS PRESENT     CBC WITH DIFFERENTIAL      Result Value Range   WBC 6.3  4.0 - 10.5 K/uL   RBC 3.48 (*) 3.87 - 5.11 MIL/uL   Hemoglobin 10.3 (*) 12.0 - 15.0 g/dL   HCT 62.9 (*) 52.8 - 41.3 %   MCV 90.5  78.0 - 100.0 fL   MCH 29.6  26.0 - 34.0 pg   MCHC 32.7  30.0 - 36.0 g/dL   RDW 24.4  01.0 - 27.2 %   Platelets 154  150 - 400 K/uL   Neutrophils Relative % 79 (*) 43 - 77 %   Neutro Abs 5.0  1.7 - 7.7 K/uL   Lymphocytes Relative 8 (*) 12 - 46 %   Lymphs Abs 0.5 (*) 0.7 - 4.0 K/uL   Monocytes Relative 10  3 - 12 %   Monocytes Absolute 0.6  0.1 - 1.0 K/uL   Eosinophils Relative 3  0 - 5 %   Eosinophils Absolute 0.2  0.0 - 0.7 K/uL   Basophils Relative 0  0 - 1 %   Basophils Absolute 0.0  0.0 - 0.1 K/uL  COMPREHENSIVE METABOLIC PANEL      Result Value Range   Sodium 137  135 - 145 mEq/L   Potassium 3.7  3.5 - 5.1 mEq/L   Chloride 102  96 - 112 mEq/L   CO2 26  19 - 32 mEq/L   Glucose, Bld 213 (*) 70 - 99 mg/dL   BUN 14  6 - 23 mg/dL   Creatinine, Ser 1.30  0.50 - 1.10 mg/dL   Calcium 9.2  8.4 - 86.5 mg/dL   Total Protein 7.5  6.0 - 8.3 g/dL   Albumin 3.4 (*) 3.5 - 5.2 g/dL   AST 18  0 - 37 U/L   ALT 18  0 - 35 U/L   Alkaline Phosphatase 161 (*) 39 - 117 U/L   Total Bilirubin 0.3  0.3 - 1.2 mg/dL   GFR calc non Af Amer >90  >90 mL/min   GFR calc Af Amer >90  >90 mL/min  LACTIC ACID, PLASMA      Result Value Range   Lactic Acid, Venous 1.2  0.5 - 2.2 mmol/L  LIPASE, BLOOD      Result Value Range   Lipase 10 (*) 11 - 59 U/L  TROPONIN I      Result Value Range   Troponin I <0.30  <0.30 ng/mL   Ct Abdomen Pelvis W Contrast 11/01/2013   CLINICAL DATA:  Right-sided abdominal pain.  Pancreatic cancer.  EXAM: CT ABDOMEN AND PELVIS WITH CONTRAST  TECHNIQUE: Multidetector CT imaging of the abdomen and pelvis was performed using the standard protocol following bolus administration of intravenous contrast.  CONTRAST:  80mL OMNIPAQUE IOHEXOL 300 MG/ML  SOLN  COMPARISON:  08/12/2013.  FINDINGS: The  lung bases are clear.  No pulmonary nodules or pleural effusion.  The liver is unremarkable and stable. No findings for metastatic disease. The permanent biliary stent appears to be in good position. There is associated pneumobilia. The gallbladder is unremarkable. No findings for acute cholecystitis. 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 The portal vein is reconstituted by collaterals in the periportal region. The spleen is unremarkable.  Stable infiltrating pancreatic mass in the head body region. No progression is identified. There are stable peripancreatic lymph nodes.  The adrenal glands and kidneys are unremarkable and stable. A left renal cyst is unchanged.  Advanced atherosclerotic calcifications involving the aorta and branch vessels. Small scattered retroperitoneal lymph nodes.  The stomach is not well distended with contrast. Mild wall thickening involving the duodenum bulb and C-loop without obvious obstruction or invasion by tumor. The small bowel and colon are grossly normal. No inflammatory changes or mass lesions.  The uterus and ovaries are unremarkable. Stable calcified uterine fibroid. No pelvic mass, adenopathy or significant free pelvic fluid collections. No inguinal mass or adenopathy.  The bony structures are unremarkable.  IMPRESSION: Stable CT appearance of the pancreatic neoplasm. No obvious progression or metastatic disease.  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  There is a new permanent common bile duct stent which appears to be in good position without complicating features.  Stable age advanced atherosclerotic calcifications involving the aorta and branch vessels.   Electronically Signed   By: Loralie Champagne M.D.   On: 11/01/2013 14:54   Dg Abd Acute W/chest 11/01/2013   CLINICAL DATA:  Generalized body ache  EXAM: ACUTE ABDOMEN SERIES (ABDOMEN 2 VIEW & CHEST 1 VIEW)  COMPARISON:  CT 08/12/2013  FINDINGS: Cardiomediastinal silhouette is unremarkable. No acute  infiltrate or pleural effusion. No pulmonary edema.  There is nonspecific nonobstructive bowel gas pattern. Again noted biliary stent. No free abdominal air.  IMPRESSION: No acute disease. Nonspecific nonobstructive bowel gas pattern. Biliary stent.   Electronically Signed   By: Natasha Mead M.D.   On: 11/01/2013 13:31    1600:  No clear UTI on Udip; UC pending. Workup reassuring. Pt feels  better and wants to go home now. Has tol PO well without N/V. No stooling while in the ED. Dx and testing d/w pt and family.  Questions answered.  Verb understanding, agreeable to d/c home with outpt f/u.    Laray Anger, DO 11/03/13 [phone]

## 2013-11-01 NOTE — Progress Notes (Signed)
Echocardiogram performed.  

## 2013-11-02 LAB — URINE CULTURE: Culture: NO GROWTH

## 2013-11-25 ENCOUNTER — Telehealth: Payer: Self-pay | Admitting: Cardiology

## 2013-11-25 ENCOUNTER — Telehealth: Payer: Self-pay | Admitting: *Deleted

## 2013-11-25 MED ORDER — METOPROLOL TARTRATE 25 MG PO TABS: 25.0000 mg | ORAL_TABLET | Freq: Two times a day (BID) | ORAL | Status: DC

## 2013-11-25 NOTE — Telephone Encounter (Signed)
New Problem: ° °Pt is calling to hear her recent  test results.  °

## 2013-11-25 NOTE — Telephone Encounter (Signed)
Call from pt's daughter requesting office visit. Reports pt is having increased abdominal and back pain. Oxycodone is effective, family would just like pt to be sooner than March. Will review with providers for appt. They are unable to bring pt in for work in today. Will need 2 days prior notice to arrange transportation. Order sent to schedulers for office visit.

## 2013-11-25 NOTE — Telephone Encounter (Signed)
New message  Daughter has question Denman George has questions regarding metoprolol 25 mg

## 2013-11-25 NOTE — Telephone Encounter (Signed)
Spoke with patient regarding results from 11/01/13 ECHO results. Patient provided results and that Dr. Meda Coffee advises patient to begin Metoprolol 25 mg by mouth twice daily. Dr. Meda Coffee also advising patient one month follow up appointment, however patient has not increased dosage of Metoprolol, as of yet, due to delay in getting information. Appointment scheduled for 12/13/13 at 11:45 am, as patient will begin new dosage of Metoprolol right away. Provided education regarding LVH findings and Metoprolol. Patient verbalized understanding and agreement with treatment plan. Stated she will attend the January 26th appointment with Dr. Meda Coffee. Patient advised to call office for any worsening symptoms prior to appointment or if she experiences any side effects from increase in Metoprolol (dizzyness, lightheadness, increased fatigue, etc). Patient verbalized agreement and understanding. Called patient again to clarify dosage of Metoprolol is 25 mg by mouth twice daily.

## 2013-11-25 NOTE — Telephone Encounter (Signed)
Metoprolol 25 mg by mouth twice daily called into pharmacy.

## 2013-11-25 NOTE — Telephone Encounter (Signed)
Patient's daughter, Timmie Foerster, called back to report that the patient (her mother) was also complaining of chest pain and back pain that has been worsening over the past few days. She states it is a "dull/sharp" pain to her mid-upper back, chest under her breasts and her sides. She states it is fairly constant and she is worried about it. She denies cough, SOB or dyspnea. Offered her appointment for tomorrow (11/26/13), however she states she needs two days notice for [facility]. Advised patient that should she experience SOB, dyspnea, productive cough, or other/worsening symptoms, she needs to call 911 and get emergent care. Patient and patient's daughter verbalized understanding (I could hear patient speaking beside the phone). Also advised patient's daughter that PCP and Oncologist should be notified of new pain in back/chest/sides. Patient's daughter to call back tomorrow 11/26/13 to schedule follow up appointment, since she wants to be seen sooner than January 26th, but can not come tomorrow. Routed to Dr. Meda Coffee.

## 2013-11-26 ENCOUNTER — Ambulatory Visit: Payer: Medicaid Other | Admitting: Physician Assistant

## 2013-11-26 NOTE — Telephone Encounter (Signed)
Pt has appt scheduled with Dr Meda Coffee on 12/13/13 and she is aware.

## 2013-11-26 NOTE — Telephone Encounter (Signed)
Can you schedule her to see me sometimes next month? Thank you, Houston Siren

## 2013-11-29 ENCOUNTER — Telehealth: Payer: Self-pay | Admitting: Oncology

## 2013-11-29 NOTE — Telephone Encounter (Signed)
Gave pt appt for ML january 2015

## 2013-11-29 NOTE — Telephone Encounter (Signed)
s.w. pt and advised on appts....pt ok and aware °

## 2013-12-06 ENCOUNTER — Ambulatory Visit (HOSPITAL_BASED_OUTPATIENT_CLINIC_OR_DEPARTMENT_OTHER): Payer: Medicaid Other | Admitting: Nurse Practitioner

## 2013-12-06 ENCOUNTER — Telehealth: Payer: Self-pay | Admitting: Oncology

## 2013-12-06 ENCOUNTER — Ambulatory Visit: Payer: Medicaid Other

## 2013-12-06 VITALS — BP 136/73 | HR 77 | Temp 97.9°F | Resp 19 | Ht 62.5 in | Wt 153.3 lb

## 2013-12-06 DIAGNOSIS — R229 Localized swelling, mass and lump, unspecified: Secondary | ICD-10-CM

## 2013-12-06 DIAGNOSIS — I81 Portal vein thrombosis: Secondary | ICD-10-CM

## 2013-12-06 DIAGNOSIS — C259 Malignant neoplasm of pancreas, unspecified: Secondary | ICD-10-CM

## 2013-12-06 DIAGNOSIS — F341 Dysthymic disorder: Secondary | ICD-10-CM

## 2013-12-06 DIAGNOSIS — G893 Neoplasm related pain (acute) (chronic): Secondary | ICD-10-CM

## 2013-12-06 DIAGNOSIS — C250 Malignant neoplasm of head of pancreas: Secondary | ICD-10-CM

## 2013-12-06 DIAGNOSIS — D7389 Other diseases of spleen: Secondary | ICD-10-CM

## 2013-12-06 DIAGNOSIS — M549 Dorsalgia, unspecified: Secondary | ICD-10-CM

## 2013-12-06 DIAGNOSIS — K55059 Acute (reversible) ischemia of intestine, part and extent unspecified: Secondary | ICD-10-CM

## 2013-12-06 DIAGNOSIS — R109 Unspecified abdominal pain: Secondary | ICD-10-CM

## 2013-12-06 LAB — CANCER ANTIGEN 19-9: CA 19-9: 167.1 U/mL — ABNORMAL HIGH (ref <35.0)

## 2013-12-06 MED ORDER — OXYCODONE-ACETAMINOPHEN 5-325 MG PO TABS: 1.0000 | ORAL_TABLET | Freq: Four times a day (QID) | ORAL | Status: DC | PRN

## 2013-12-06 NOTE — Progress Notes (Addendum)
OFFICE PROGRESS NOTE  Interval history:  [name] [name] returns after missing multiple followup visits. She was last seen on 08/17/2013. She reports increased back and abdominal pain. The pain worsens when she has a bowel movement or urinates. She is taking Advil and tramadol. She also reports taking OxyContin every 12 hours. She discontinued Lovenox "a long time ago" due to abdominal wall bruises and hematomas. She has intermittent chest discomfort. She has recently noted that food "falls out" of the left side of her mouth with eating. She denies focal extremity weakness.   Objective: Filed Vitals:   12/06/13 1107  BP: 136/73  Pulse: 77  Temp: 97.9 F (36.6 C)  Resp: 19   No thrush or ulcerations. Lungs are clear. Regular cardiac rhythm. Abdomen is soft with generalized tenderness. No organomegaly. No mass. No leg edema. Motor strength 5 over 5. No skin rash. She is alert and oriented. Face symmetric. Speech fluent. Motor strength 5 over 5.   Lab Results: Lab Results  Component Value Date   WBC 6.3 11/01/2013   HGB 10.3* 11/01/2013   HCT 31.5* 11/01/2013   MCV 90.5 11/01/2013   PLT 154 11/01/2013   NEUTROABS 5.0 11/01/2013    Chemistry:    Chemistry      Component Value Date/Time   NA 137 11/01/2013 1213   NA 139 08/17/2013 1451   K 3.7 11/01/2013 1213   K 3.7 08/17/2013 1451   CL 102 11/01/2013 1213   CO2 26 11/01/2013 1213   CO2 25 08/17/2013 1451   BUN 14 11/01/2013 1213   BUN 11.9 08/17/2013 1451   CREATININE 0.59 11/01/2013 1213   CREATININE 0.8 08/17/2013 1451      Component Value Date/Time   CALCIUM 9.2 11/01/2013 1213   CALCIUM 9.6 08/17/2013 1451   ALKPHOS 161* 11/01/2013 1213   ALKPHOS 172* 08/03/2013 1543   AST 18 11/01/2013 1213   AST 16 08/03/2013 1543   ALT 18 11/01/2013 1213   ALT 35 08/03/2013 1543   BILITOT 0.3 11/01/2013 1213   BILITOT 0.56 08/03/2013 1543       Studies/Results: 11/01/2013 CT abdomen and pelvis. Stable infiltrating pancreatic mass. No  progression identified. Stable peripancreatic lymph nodes. No obvious metastatic disease. New prominent, bile duct stent which appeared to be in good position.  Medications: I have reviewed the patient's current medications.  Assessment/Plan: 1. 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 Initiation of radiation and Xeloda 07/20/2013 with completion 08/09/2013. CT scans abdomen/pelvis done 11/01/2013 for evaluation of increased abdominal pain showed stable infiltrating pancreatic mass. No progression identified. Stable peripancreatic lymph nodes. 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 No evidence of metastatic disease. 2. Obstructive jaundice secondary to #1. Status post placement of a bile duct stent on 05/24/2013. Status post placement of a metal stent 10/21/2013. 3. Pain secondary to pancreas cancer.  4. Diabetes. 5. History of chronic pancreatitis. 6. History of alcohol use. 7. History of tobacco use. 8. Anxiety/depression. 9. Cutaneous nodule at the left flank. Question cyst, question metastasis. 10. Abnormal EKG, question Brugada syndrome. 11. Hospitalization 08/12/2013 through 08/15/2013 with nausea/vomiting and abdominal pain. Improved. 12. 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 Portal vein patent. New wall thickening of the mid  to distal stomach and the hepatic flexure and proximal transverse colon. 13. 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 She discontinued Lovenox due to abdominal wall ecchymoses and hematomas.  Dispositon-she has multiple complaints. She is experiencing increased back and  abdominal pain likely secondary to pancreas cancer. We gave her a prescription for Percocet 1-2 tablets every 6 hours as needed.  She had a CT scan of the abdomen/pelvis 11/01/2013 during a visit to emergency Department for evaluation of abdominal pain. The pancreatic mass was stable. Stable peripancreatic lymph nodes. There was no evidence of metastatic disease. 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  She is interested in a referral to Dr. Barry Dienes to determine if she is a candidate for resection.  We are obtaining a followup CA 19-9 today. She will return for a followup visit in 2 weeks.  Patient seen with Dr. Benay Spice.   25 minutes were spent face-to-face at today's visit with the majority of the time involved in counseling/coordination of care.   Ned Card ANP/GNP-BC   This was a shared visit with Ned Card. [name] [name] completed Xeloda and radiation in September. She did not return for a scheduled followup visit. She underwent placement of a metal bile duct stent on 10/21/2013. She has persistent abdominal pain secondary to pancreas cancer.  I discussed the diagnosis and treatment options with [name] [name] and her daughter. She would like to be considered for resection of the pancreas mass. We referred her to Dr. Barry Dienes, though I think is very unlikely she will be a candidate for surgery. She will return for an office visit after the appointment with Dr. Barry Dienes. We will consider gemcitabine/Abraxane if she is not a candidate for surgery.  Julieanne Manson, M.D.

## 2013-12-06 NOTE — Telephone Encounter (Signed)
gv and printed appt sched and avs for pt for Feb....sent pt to lab mD visit needed to be after Dr. Barry Dienes appt

## 2013-12-06 NOTE — Telephone Encounter (Signed)
pt sent back to lab and will return to scheduling for f/u appt. per 1/19 pof pt to return 2/2 @ 12:15pm. per Barnett Applebaum pt to be seen after 2/6. Gina informed that pof will need to be sent re change and giving specific date/time due to BS had no appts within the time frame.

## 2013-12-13 ENCOUNTER — Ambulatory Visit: Payer: Medicaid Other | Admitting: Cardiology

## 2013-12-13 ENCOUNTER — Ambulatory Visit (INDEPENDENT_AMBULATORY_CARE_PROVIDER_SITE_OTHER): Payer: Medicaid Other | Admitting: Cardiology

## 2013-12-13 ENCOUNTER — Encounter: Payer: Self-pay | Admitting: Cardiology

## 2013-12-13 VITALS — BP 160/82 | HR 83 | Ht 65.0 in | Wt 151.0 lb

## 2013-12-13 DIAGNOSIS — J90 Pleural effusion, not elsewhere classified: Secondary | ICD-10-CM

## 2013-12-13 MED ORDER — LISINOPRIL 40 MG PO TABS: ORAL_TABLET | ORAL | Status: DC

## 2013-12-13 NOTE — Progress Notes (Signed)
Patient ID: [name] [name], female   DOB: [birth year], 58 y.o.   MRN: [id number]    Patient Name: [name] [name] Date of Encounter: 12/13/2013  Primary Care Provider:  Philis Fendt, MD Primary Cardiologist:  [name], H   Problem List   Past Medical History  Diagnosis Date  . DM (diabetes mellitus), type 2     requires insulin  . Hypertension   . Depression   . Anxiety   . Hyperlipidemia   . Pancreatitis   . GERD (gastroesophageal reflux disease)   . Pancreatic cancer 05/2013    adenocarcinoma on ERCP/FNA  . Hearing impaired     lost the last of her hearing aids and cn not get another one.  hearing is better via right ear.   Marland Kitchen History of radiation therapy 07/20/13-08/09/13    Pancreas 37.5Gy  . DDD (degenerative disc disease), lumbar   . Chronic back pain    Past Surgical History  Procedure Laterality Date  . Appendectomy    . Ercp N/A 05/24/2013    Procedure: ENDOSCOPIC RETROGRADE CHOLANGIOPANCREATOGRAPHY (ERCP);  Surgeon: Milus Banister, MD;  Location: [facility];  Service: Endoscopy;  Laterality: N/A;  MAC vs. general per anesthesia   . Tubal ligation    . Endoscopic retrograde cholangiopancreatography (ercp) with propofol N/A 10/21/2013    Procedure: ENDOSCOPIC RETROGRADE CHOLANGIOPANCREATOGRAPHY (ERCP) WITH PROPOFOL;  Surgeon: Milus Banister, MD;  Location: [facility];  Service: Endoscopy;  Laterality: N/A;  Stent removal   . Biliary stent placement N/A 10/21/2013    Procedure: BILIARY STENT PLACEMENT;  Surgeon: Milus Banister, MD;  Location: [facility];  Service: Endoscopy;  Laterality: N/A;   Allergies  Allergies  Allergen Reactions  . Betadine [Povidone Iodine] Itching and Rash   HPI  58 year old female with h/o DM2, HTN, HLP and pancreatic adenocarcinoma diagnosed in July 2014, s/p biliary stenting, neoadjuvant chemotherapy and radiation therapy.  She came on 11/25/2013 for cardio clearance for another ERCP. Her stress test was negative and she has preserved  LVEF.   The patient comes after 3 week follow up. She is scheduled to see a surgeon for consideration of partial tumor resection. The patient complains of chest pain that is positional, lower retrosternal to upper epigastric and radiating to her back. Sitting up alleviated the pain.  She also has a lot of abdominal pain, she takes Advil and tramadol.  Home Medications  Prior to Admission medications   Medication Sig Start Date End Date Taking? Authorizing Provider  gabapentin (NEURONTIN) 300 MG capsule Take 300 mg by mouth 3 (three) times daily.    Historical Provider, MD  hyaluronate sodium (RADIAPLEXRX) GEL Apply 1 application topically 2 (two) times daily.    Historical Provider, MD  HYDROmorphone (DILAUDID) 4 MG tablet Take 0.5-1 tablets (2-4 mg total) by mouth every 4 (four) hours as needed for pain. 07/15/13   Owens Shark, NP  ibuprofen (ADVIL) 200 MG tablet Take 600 mg by mouth every 6 (six) hours as needed for pain.    Historical Provider, MD  insulin aspart (NOVOLOG) 100 UNIT/ML injection Inject 10 Units into the skin 3 (three) times daily with meals.    Historical Provider, MD  insulin glargine (LANTUS) 100 UNIT/ML injection Inject 20 Units into the skin at bedtime.    Historical Provider, MD  lisinopril (PRINIVIL,ZESTRIL) 20 MG tablet Take 1 tablet (20 mg total) by mouth daily. 07/15/13   Owens Shark, NP  ondansetron (ZOFRAN-ODT) 4 MG disintegrating tablet Take  4 mg by mouth every 8 (eight) hours as needed for nausea. 07/23/13   Marye Round, MD  OxyCODONE (OXYCONTIN) 10 mg T12A 12 hr tablet Take 1 tablet (10 mg total) by mouth every 12 (twelve) hours. 09/24/13   Milus Banister, MD  pantoprazole (PROTONIX) 40 MG tablet Take 1 tablet (40 mg total) by mouth 2 (two) times daily. 08/15/13   Kelvin Cellar, MD  polyethylene glycol (MIRALAX / GLYCOLAX) packet Take 17 g by mouth daily as needed (constipation).    Historical Provider, MD  potassium chloride (K-DUR,KLOR-CON) 10 MEQ tablet  Take 10 mEq by mouth daily.    Historical Provider, MD  simethicone (MYLICON) 259 MG chewable tablet Chew 125 mg by mouth every 6 (six) hours as needed for flatulence.    Historical Provider, MD  simvastatin (ZOCOR) 10 MG tablet Take 10 mg by mouth at bedtime.    Historical Provider, MD  traMADol (ULTRAM) 50 MG tablet Take 1 tablet (50 mg total) by mouth every 6 (six) hours as needed for pain. 08/17/13   Owens Shark, NP    Family History  Family History  Problem Relation Age of Onset  . Diabetes type II Mother   . Hypertension Mother   . Heart attack Father   . HIV/AIDS Brother     Social History  History   Social History  . Marital Status: Single    Spouse Name: N/A    Number of Children: 5  . Years of Education: N/A   Occupational History  . Not on file.   Social History Main Topics  . Smoking status: Former Smoker -- 0.50 packs/day for 42 years    Quit date: 05/28/2013  . Smokeless tobacco: Never Used  . Alcohol Use: No  . Drug Use: No  . Sexual Activity: Yes    Birth Control/ Protection: Post-menopausal   Other Topics Concern  . Not on file   Social History Narrative  . No narrative on file     Review of Systems, as per HPI, otherwise negative General:  No chills, fever, night sweats or weight changes.  Cardiovascular:  No chest pain, dyspnea on exertion, edema, orthopnea, palpitations, paroxysmal nocturnal dyspnea. Dermatological: No rash, lesions/masses Respiratory: No cough, dyspnea Urologic: No hematuria, dysuria Abdominal:   No nausea, vomiting, diarrhea, bright red blood per rectum, melena, or hematemesis Neurologic:  No visual changes, wkns, changes in mental status. All other systems reviewed and are otherwise negative except as noted above.  Physical Exam BP 153/73, HR 75 BPM General: Pleasant, NAD Psych: Normal affect. Neuro: Alert and oriented X 3. Moves all extremities spontaneously. HEENT: Normal  Neck: Supple without bruits or  JVD. Lungs:  Resp regular and unlabored, CTA. Heart: RRR no s3, s4, or murmurs. Abdomen: Soft, non-tender, non-distended, BS + x 4.  Extremities: No clubbing, cyanosis or edema. DP/PT/Radials 2+ and equal bilaterally.  Labs:  No results found for this basename: CKTOTAL, CKMB, TROPONINI,  in the last 72 hours Lab Results  Component Value Date   WBC 6.3 11/01/2013   HGB 10.3* 11/01/2013   HCT 31.5* 11/01/2013   MCV 90.5 11/01/2013   PLT 154 11/01/2013    Accessory Clinical Findings  Echocardiogram - none to date  ECG - Sinus rhythm, possible type 2 Brugada pattern, abnormal ECG, unchanged from the prior preformed on 05/29/2013.   Assessment & Plan  58 year old female with locally advanced pancreatic cancer requiring ERCP for biliary stent placement. She will be evaluated  by Dr. Barry Dienes to determine if she is a candidate for resection.  She has symptoms of atypical chest pain and abdominal pain. Lexiscan nuclear stress test  showed normal left ventricular function with no wall motion abnormalities and no perfusion defects suggestive of a scar or ischemia.   Her pain seems to be related to her pancreatic adenocarcinoma. There are some features suggestive of possible pericarditis. There was pericardial effusion on the prior echocardiogram, we will order a limited follow up echo for the evaluation of effusion.   There is currently no contraindication from cardiology standpoint for this patient to undergo resection of the adenocarcinoma. We would recommend for her to continue taking metoprolol in the perioperative period.   The blood pressure is elevated, we will increase Lisinopril to 40 mg po daily.    Follow up in 3 months.  Dorothy Spark, MD, [facility] 12/13/2013, 11:48 AM

## 2013-12-13 NOTE — Patient Instructions (Signed)
Your physician has requested that you have an echocardiogram. Echocardiography is a painless test that uses sound waves to create images of your heart. It provides your doctor with information about the size and shape of your heart and how well your heart's chambers and valves are working. This procedure takes approximately one hour. There are no restrictions for this procedure.  Your physician has recommended you make the following change in your medication:   1. Increase Lisinopril to 40 mg daily.  Your physician wants you to follow-up in: 3 months with Dr. Meda Coffee. You will receive a reminder letter in the mail two months in advance. If you don't receive a letter, please call our office to schedule the follow-up appointment.

## 2013-12-20 ENCOUNTER — Encounter (INDEPENDENT_AMBULATORY_CARE_PROVIDER_SITE_OTHER): Payer: Medicaid Other | Admitting: General Surgery

## 2013-12-20 ENCOUNTER — Ambulatory Visit: Payer: Medicaid Other | Admitting: Oncology

## 2013-12-22 ENCOUNTER — Telehealth: Payer: Self-pay | Admitting: Gastroenterology

## 2013-12-22 ENCOUNTER — Inpatient Hospital Stay (HOSPITAL_COMMUNITY): Payer: Medicaid Other

## 2013-12-22 ENCOUNTER — Encounter (HOSPITAL_COMMUNITY): Payer: Self-pay | Admitting: Emergency Medicine

## 2013-12-22 ENCOUNTER — Emergency Department (HOSPITAL_COMMUNITY): Payer: Medicaid Other

## 2013-12-22 ENCOUNTER — Telehealth: Payer: Self-pay | Admitting: *Deleted

## 2013-12-22 ENCOUNTER — Inpatient Hospital Stay (HOSPITAL_COMMUNITY)
Admission: EM | Admit: 2013-12-22 | Discharge: 2014-01-05 | DRG: 871 | Disposition: A | Discharge status: Home Health | Payer: Medicaid Other | Attending: Internal Medicine | Admitting: Internal Medicine

## 2013-12-22 DIAGNOSIS — G8929 Other chronic pain: Secondary | ICD-10-CM | POA: Diagnosis present

## 2013-12-22 DIAGNOSIS — F3289 Other specified depressive episodes: Secondary | ICD-10-CM | POA: Diagnosis present

## 2013-12-22 DIAGNOSIS — Z794 Long term (current) use of insulin: Secondary | ICD-10-CM

## 2013-12-22 DIAGNOSIS — Z8249 Family history of ischemic heart disease and other diseases of the circulatory system: Secondary | ICD-10-CM

## 2013-12-22 DIAGNOSIS — R509 Fever, unspecified: Secondary | ICD-10-CM

## 2013-12-22 DIAGNOSIS — Z87891 Personal history of nicotine dependence: Secondary | ICD-10-CM

## 2013-12-22 DIAGNOSIS — I69928 Other speech and language deficits following unspecified cerebrovascular disease: Secondary | ICD-10-CM

## 2013-12-22 DIAGNOSIS — R6521 Severe sepsis with septic shock: Secondary | ICD-10-CM

## 2013-12-22 DIAGNOSIS — R7881 Bacteremia: Secondary | ICD-10-CM

## 2013-12-22 DIAGNOSIS — R0789 Other chest pain: Secondary | ICD-10-CM

## 2013-12-22 DIAGNOSIS — A419 Sepsis, unspecified organism: Secondary | ICD-10-CM | POA: Diagnosis present

## 2013-12-22 DIAGNOSIS — R1013 Epigastric pain: Secondary | ICD-10-CM

## 2013-12-22 DIAGNOSIS — R6520 Severe sepsis without septic shock: Secondary | ICD-10-CM

## 2013-12-22 DIAGNOSIS — E119 Type 2 diabetes mellitus without complications: Secondary | ICD-10-CM | POA: Diagnosis present

## 2013-12-22 DIAGNOSIS — F411 Generalized anxiety disorder: Secondary | ICD-10-CM | POA: Diagnosis present

## 2013-12-22 DIAGNOSIS — R6510 Systemic inflammatory response syndrome (SIRS) of non-infectious origin without acute organ dysfunction: Secondary | ICD-10-CM

## 2013-12-22 DIAGNOSIS — A409 Streptococcal sepsis, unspecified: Principal | ICD-10-CM | POA: Diagnosis present

## 2013-12-22 DIAGNOSIS — K831 Obstruction of bile duct: Secondary | ICD-10-CM | POA: Diagnosis present

## 2013-12-22 DIAGNOSIS — Z9221 Personal history of antineoplastic chemotherapy: Secondary | ICD-10-CM

## 2013-12-22 DIAGNOSIS — I10 Essential (primary) hypertension: Secondary | ICD-10-CM | POA: Diagnosis present

## 2013-12-22 DIAGNOSIS — Z923 Personal history of irradiation: Secondary | ICD-10-CM

## 2013-12-22 DIAGNOSIS — R195 Other fecal abnormalities: Secondary | ICD-10-CM | POA: Diagnosis present

## 2013-12-22 DIAGNOSIS — R4182 Altered mental status, unspecified: Secondary | ICD-10-CM | POA: Diagnosis present

## 2013-12-22 DIAGNOSIS — H9190 Unspecified hearing loss, unspecified ear: Secondary | ICD-10-CM | POA: Diagnosis present

## 2013-12-22 DIAGNOSIS — C259 Malignant neoplasm of pancreas, unspecified: Secondary | ICD-10-CM | POA: Diagnosis present

## 2013-12-22 DIAGNOSIS — Z7982 Long term (current) use of aspirin: Secondary | ICD-10-CM

## 2013-12-22 DIAGNOSIS — M549 Dorsalgia, unspecified: Secondary | ICD-10-CM | POA: Diagnosis present

## 2013-12-22 DIAGNOSIS — Z833 Family history of diabetes mellitus: Secondary | ICD-10-CM

## 2013-12-22 DIAGNOSIS — D638 Anemia in other chronic diseases classified elsewhere: Secondary | ICD-10-CM | POA: Diagnosis present

## 2013-12-22 DIAGNOSIS — F329 Major depressive disorder, single episode, unspecified: Secondary | ICD-10-CM | POA: Diagnosis present

## 2013-12-22 DIAGNOSIS — R0609 Other forms of dyspnea: Secondary | ICD-10-CM

## 2013-12-22 DIAGNOSIS — E785 Hyperlipidemia, unspecified: Secondary | ICD-10-CM

## 2013-12-22 DIAGNOSIS — C250 Malignant neoplasm of head of pancreas: Secondary | ICD-10-CM

## 2013-12-22 DIAGNOSIS — R000 Tachycardia, unspecified: Secondary | ICD-10-CM

## 2013-12-22 LAB — URINALYSIS, ROUTINE W REFLEX MICROSCOPIC
BILIRUBIN URINE: NEGATIVE
Glucose, UA: NEGATIVE mg/dL
Hgb urine dipstick: NEGATIVE
Ketones, ur: NEGATIVE mg/dL
LEUKOCYTES UA: NEGATIVE
Nitrite: NEGATIVE
PH: 5.5 (ref 5.0–8.0)
Protein, ur: NEGATIVE mg/dL
Specific Gravity, Urine: 1.025 (ref 1.005–1.030)
Urobilinogen, UA: 0.2 mg/dL (ref 0.0–1.0)

## 2013-12-22 LAB — CBC WITH DIFFERENTIAL/PLATELET
BASOS PCT: 0 % (ref 0–1)
Basophils Absolute: 0 10*3/uL (ref 0.0–0.1)
EOS ABS: 0 10*3/uL (ref 0.0–0.7)
Eosinophils Relative: 0 % (ref 0–5)
HCT: 31.3 % — ABNORMAL LOW (ref 36.0–46.0)
HEMOGLOBIN: 10 g/dL — AB (ref 12.0–15.0)
Lymphocytes Relative: 1 % — ABNORMAL LOW (ref 12–46)
Lymphs Abs: 0.1 10*3/uL — ABNORMAL LOW (ref 0.7–4.0)
MCH: 28.3 pg (ref 26.0–34.0)
MCHC: 31.9 g/dL (ref 30.0–36.0)
MCV: 88.7 fL (ref 78.0–100.0)
Monocytes Absolute: 0.2 10*3/uL (ref 0.1–1.0)
Monocytes Relative: 2 % — ABNORMAL LOW (ref 3–12)
NEUTROS ABS: 9.3 10*3/uL — AB (ref 1.7–7.7)
NEUTROS PCT: 97 % — AB (ref 43–77)
PLATELETS: 161 10*3/uL (ref 150–400)
RBC: 3.53 MIL/uL — ABNORMAL LOW (ref 3.87–5.11)
RDW: 13.8 % (ref 11.5–15.5)
WBC: 9.6 10*3/uL (ref 4.0–10.5)

## 2013-12-22 LAB — COMPREHENSIVE METABOLIC PANEL
ALK PHOS: 202 U/L — AB (ref 39–117)
ALT: 36 U/L — ABNORMAL HIGH (ref 0–35)
AST: 62 U/L — ABNORMAL HIGH (ref 0–37)
Albumin: 3.6 g/dL (ref 3.5–5.2)
BUN: 14 mg/dL (ref 6–23)
CHLORIDE: 104 meq/L (ref 96–112)
CO2: 17 mEq/L — ABNORMAL LOW (ref 19–32)
Calcium: 9.2 mg/dL (ref 8.4–10.5)
Creatinine, Ser: 0.82 mg/dL (ref 0.50–1.10)
GFR calc Af Amer: 88 mL/min — ABNORMAL LOW (ref >90)
GFR calc non Af Amer: 76 mL/min — ABNORMAL LOW (ref >90)
Glucose, Bld: 98 mg/dL (ref 70–99)
POTASSIUM: 3.5 meq/L — AB (ref 3.7–5.3)
SODIUM: 139 meq/L (ref 137–147)
TOTAL PROTEIN: 8.1 g/dL (ref 6.0–8.3)
Total Bilirubin: 0.4 mg/dL (ref 0.3–1.2)

## 2013-12-22 LAB — CG4 I-STAT (LACTIC ACID): Lactic Acid, Venous: 3.39 mmol/L — ABNORMAL HIGH (ref 0.5–2.2)

## 2013-12-22 LAB — GLUCOSE, CAPILLARY: Glucose-Capillary: 93 mg/dL (ref 70–99)

## 2013-12-22 MED ORDER — POTASSIUM CHLORIDE CRYS ER 10 MEQ PO TBCR
10.0000 meq | EXTENDED_RELEASE_TABLET | Freq: Every day | ORAL | Status: DC
  Administered 2013-12-24 – 2014-01-05 (×13): 10 meq via ORAL
  Filled 2013-12-22 (×14): qty 1

## 2013-12-22 MED ORDER — AMLODIPINE BESYLATE 10 MG PO TABS
10.0000 mg | ORAL_TABLET | Freq: Every day | ORAL | Status: DC
  Administered 2013-12-25 – 2014-01-05 (×12): 10 mg via ORAL
  Filled 2013-12-22 (×15): qty 1

## 2013-12-22 MED ORDER — LISINOPRIL 40 MG PO TABS
40.0000 mg | ORAL_TABLET | Freq: Every day | ORAL | Status: DC
  Administered 2013-12-24: 40 mg via ORAL
  Filled 2013-12-22 (×2): qty 1

## 2013-12-22 MED ORDER — IOHEXOL 300 MG/ML  SOLN
25.0000 mL | Freq: Once | INTRAMUSCULAR | Status: AC | PRN
  Administered 2013-12-22: 25 mL via ORAL

## 2013-12-22 MED ORDER — PIPERACILLIN-TAZOBACTAM 3.375 G IVPB
3.3750 g | Freq: Three times a day (TID) | INTRAVENOUS | Status: DC
  Administered 2013-12-23 – 2013-12-27 (×14): 3.375 g via INTRAVENOUS
  Filled 2013-12-22 (×18): qty 50

## 2013-12-22 MED ORDER — PIPERACILLIN-TAZOBACTAM 3.375 G IVPB 30 MIN
3.3750 g | Freq: Once | INTRAVENOUS | Status: AC
Start: 2013-12-22 — End: 2013-12-22
  Administered 2013-12-22: 3.375 g via INTRAVENOUS
  Filled 2013-12-22: qty 50

## 2013-12-22 MED ORDER — GABAPENTIN 300 MG PO CAPS
300.0000 mg | ORAL_CAPSULE | Freq: Three times a day (TID) | ORAL | Status: DC
  Administered 2013-12-22 – 2014-01-05 (×39): 300 mg via ORAL
  Filled 2013-12-22 (×44): qty 1

## 2013-12-22 MED ORDER — VANCOMYCIN HCL IN DEXTROSE 1-5 GM/200ML-% IV SOLN
1000.0000 mg | Freq: Once | INTRAVENOUS | Status: AC
Start: 2013-12-22 — End: 2013-12-22
  Administered 2013-12-22: 1000 mg via INTRAVENOUS
  Filled 2013-12-22: qty 200

## 2013-12-22 MED ORDER — SODIUM CHLORIDE 0.9 % IV SOLN
1000.0000 mL | Freq: Once | INTRAVENOUS | Status: AC
  Administered 2013-12-22: 1000 mL via INTRAVENOUS

## 2013-12-22 MED ORDER — OXYCODONE HCL ER 10 MG PO T12A: 10.0000 mg | EXTENDED_RELEASE_TABLET | Freq: Two times a day (BID) | ORAL | Status: DC | PRN

## 2013-12-22 MED ORDER — SODIUM CHLORIDE 0.9 % IJ SOLN
3.0000 mL | Freq: Two times a day (BID) | INTRAMUSCULAR | Status: DC
  Administered 2013-12-22: 3 mL via INTRAVENOUS

## 2013-12-22 MED ORDER — SODIUM CHLORIDE 0.9 % IV SOLN
1000.0000 mL | INTRAVENOUS | Status: DC
  Administered 2013-12-22 – 2013-12-25 (×6): 1000 mL via INTRAVENOUS

## 2013-12-22 MED ORDER — SIMVASTATIN 10 MG PO TABS
10.0000 mg | ORAL_TABLET | Freq: Every day | ORAL | Status: DC
  Administered 2013-12-22 – 2014-01-04 (×14): 10 mg via ORAL
  Filled 2013-12-22 (×16): qty 1

## 2013-12-22 MED ORDER — PANTOPRAZOLE SODIUM 40 MG PO TBEC
40.0000 mg | DELAYED_RELEASE_TABLET | Freq: Two times a day (BID) | ORAL | Status: DC
  Administered 2013-12-22 – 2014-01-05 (×27): 40 mg via ORAL
  Filled 2013-12-22 (×31): qty 1

## 2013-12-22 MED ORDER — HEPARIN SODIUM (PORCINE) 5000 UNIT/ML IJ SOLN
5000.0000 [IU] | Freq: Three times a day (TID) | INTRAMUSCULAR | Status: DC
  Administered 2013-12-23 – 2013-12-26 (×7): 5000 [IU] via SUBCUTANEOUS
  Filled 2013-12-22 (×45): qty 1

## 2013-12-22 MED ORDER — INSULIN GLARGINE 100 UNIT/ML ~~LOC~~ SOLN
15.0000 [IU] | Freq: Every day | SUBCUTANEOUS | Status: DC
  Administered 2013-12-22 – 2013-12-27 (×6): 15 [IU] via SUBCUTANEOUS
  Filled 2013-12-22 (×8): qty 0.15

## 2013-12-22 MED ORDER — ACETAMINOPHEN 500 MG PO TABS
1000.0000 mg | ORAL_TABLET | Freq: Once | ORAL | Status: AC
  Administered 2013-12-22: 1000 mg via ORAL
  Filled 2013-12-22: qty 2

## 2013-12-22 MED ORDER — ACETAMINOPHEN 325 MG PO TABS
650.0000 mg | ORAL_TABLET | Freq: Four times a day (QID) | ORAL | Status: DC | PRN
  Administered 2013-12-27 – 2013-12-30 (×2): 650 mg via ORAL
  Filled 2013-12-22 (×2): qty 2

## 2013-12-22 MED ORDER — ASPIRIN 81 MG PO CHEW
81.0000 mg | CHEWABLE_TABLET | Freq: Every day | ORAL | Status: DC
  Administered 2013-12-24 – 2014-01-05 (×13): 81 mg via ORAL
  Filled 2013-12-22 (×14): qty 1

## 2013-12-22 MED ORDER — INSULIN ASPART 100 UNIT/ML ~~LOC~~ SOLN
0.0000 [IU] | SUBCUTANEOUS | Status: DC
  Administered 2013-12-23: 2 [IU] via SUBCUTANEOUS
  Administered 2013-12-23 (×2): 1 [IU] via SUBCUTANEOUS
  Administered 2013-12-23 (×2): 2 [IU] via SUBCUTANEOUS
  Administered 2013-12-23: 7 [IU] via SUBCUTANEOUS
  Administered 2013-12-23 – 2013-12-24 (×2): 3 [IU] via SUBCUTANEOUS
  Administered 2013-12-24: 5 [IU] via SUBCUTANEOUS
  Administered 2013-12-24: 1 [IU] via SUBCUTANEOUS
  Administered 2013-12-24: 2 [IU] via SUBCUTANEOUS
  Administered 2013-12-24 – 2013-12-25 (×3): 1 [IU] via SUBCUTANEOUS
  Administered 2013-12-25 (×2): 2 [IU] via SUBCUTANEOUS
  Administered 2013-12-25: 5 [IU] via SUBCUTANEOUS

## 2013-12-22 MED ORDER — HYDROMORPHONE HCL 2 MG PO TABS
2.0000 mg | ORAL_TABLET | ORAL | Status: DC | PRN
Start: 2013-12-22 — End: 2014-01-05
  Administered 2013-12-23 – 2013-12-24 (×2): 2 mg via ORAL
  Administered 2013-12-24: 4 mg via ORAL
  Administered 2013-12-24 – 2014-01-02 (×7): 2 mg via ORAL
  Filled 2013-12-22 (×5): qty 1
  Filled 2013-12-22: qty 2
  Filled 2013-12-22 (×4): qty 1

## 2013-12-22 MED ORDER — SUCRALFATE 1 GM/10ML PO SUSP
1.0000 g | Freq: Two times a day (BID) | ORAL | Status: DC
  Administered 2013-12-22 – 2014-01-05 (×26): 1 g via ORAL
  Filled 2013-12-22 (×30): qty 10

## 2013-12-22 MED ORDER — VANCOMYCIN HCL IN DEXTROSE 750-5 MG/150ML-% IV SOLN
750.0000 mg | Freq: Two times a day (BID) | INTRAVENOUS | Status: DC
  Administered 2013-12-23: 750 mg via INTRAVENOUS
  Filled 2013-12-22 (×3): qty 150

## 2013-12-22 MED ORDER — IBUPROFEN 800 MG PO TABS
800.0000 mg | ORAL_TABLET | Freq: Four times a day (QID) | ORAL | Status: DC | PRN
  Administered 2013-12-24: 800 mg via ORAL
  Filled 2013-12-22: qty 1

## 2013-12-22 MED ORDER — IOHEXOL 300 MG/ML  SOLN
100.0000 mL | Freq: Once | INTRAMUSCULAR | Status: AC | PRN
  Administered 2013-12-22: 100 mL via INTRAVENOUS

## 2013-12-22 MED ORDER — OXYCODONE-ACETAMINOPHEN 5-325 MG PO TABS
1.0000 | ORAL_TABLET | Freq: Four times a day (QID) | ORAL | Status: DC | PRN
  Administered 2013-12-25 – 2013-12-29 (×3): 1 via ORAL
  Administered 2013-12-30 – 2014-01-04 (×6): 2 via ORAL
  Filled 2013-12-22: qty 2
  Filled 2013-12-22: qty 1
  Filled 2013-12-22: qty 2
  Filled 2013-12-22: qty 1
  Filled 2013-12-22: qty 2
  Filled 2013-12-22: qty 1
  Filled 2013-12-22 (×4): qty 2

## 2013-12-22 NOTE — ED Notes (Signed)
Bed: CW88 Expected date:  Expected time:  Means of arrival:  Comments: EMS Fever

## 2013-12-22 NOTE — Telephone Encounter (Signed)
Call from pt's daughter reporting she is having pale, clay colored stools. This has been going on for a couple weeks. Continues to have pain in R abdomen which worsens during bowel movements. Daughter mentioned she "had the shakes today". Instructed her to check temp 100.4. Told her to call Dr. Ardis Hughs' office, report shaking chills, temp and clay colored stools. She agreed to do so. Reviewed with Dr. Benay Spice: He recommends pt contact Dr. Ardis Hughs, daughter made aware.

## 2013-12-22 NOTE — ED Notes (Signed)
Pt at CT

## 2013-12-22 NOTE — ED Notes (Signed)
Pt assisted to BR by Reeves Forth RN diarrhea only cleaned

## 2013-12-22 NOTE — ED Notes (Signed)
CG4 results given to Dr. Mingo Amber.  Myrtice Lauth

## 2013-12-22 NOTE — ED Notes (Signed)
Per [facility]- TEMP 103.1 IN ROUTE. Fever for unknown days. Pt is a Ca pt. Explosive diarrhea upon arrival.

## 2013-12-22 NOTE — ED Notes (Signed)
Dr. Gardner at bedside 

## 2013-12-22 NOTE — ED Provider Notes (Signed)
CSN: [id number]     Arrival date & time 12/22/13  1614 History   First MD Initiated Contact with Patient 12/22/13 1624     Chief Complaint  Patient presents with  . Fever   (Consider location/radiation/quality/duration/timing/severity/associated sxs/prior Treatment) Patient is a 58 y.o. female presenting with fever. The history is provided by the patient.  Fever Max temp prior to arrival:  104 Temp source:  Oral Severity:  Moderate Onset quality:  Sudden Timing:  Constant Progression:  Worsening Chronicity:  New Relieved by:  Nothing Worsened by:  Nothing tried Associated symptoms: chills   Associated symptoms: no cough, no nausea and no vomiting     Past Medical History  Diagnosis Date  . DM (diabetes mellitus), type 2     requires insulin  . Hypertension   . Depression   . Anxiety   . Hyperlipidemia   . Pancreatitis   . GERD (gastroesophageal reflux disease)   . Pancreatic cancer 05/2013    adenocarcinoma on ERCP/FNA  . Hearing impaired     lost the last of her hearing aids and cn not get another one.  hearing is better via right ear.   Marland Kitchen History of radiation therapy 07/20/13-08/09/13    Pancreas 37.5Gy  . DDD (degenerative disc disease), lumbar   . Chronic back pain    Past Surgical History  Procedure Laterality Date  . Appendectomy    . Ercp N/A 05/24/2013    Procedure: ENDOSCOPIC RETROGRADE CHOLANGIOPANCREATOGRAPHY (ERCP);  Surgeon: Milus Banister, MD;  Location: [facility];  Service: Endoscopy;  Laterality: N/A;  MAC vs. general per anesthesia   . Tubal ligation    . Endoscopic retrograde cholangiopancreatography (ercp) with propofol N/A 10/21/2013    Procedure: ENDOSCOPIC RETROGRADE CHOLANGIOPANCREATOGRAPHY (ERCP) WITH PROPOFOL;  Surgeon: Milus Banister, MD;  Location: [facility];  Service: Endoscopy;  Laterality: N/A;  Stent removal   . Biliary stent placement N/A 10/21/2013    Procedure: BILIARY STENT PLACEMENT;  Surgeon: Milus Banister, MD;  Location: [facility];  Service: Endoscopy;  Laterality: N/A;   Family History  Problem Relation Age of Onset  . Diabetes type II Mother   . Hypertension Mother   . Heart attack Father   . HIV/AIDS Brother    History  Substance Use Topics  . Smoking status: Former Smoker -- 0.50 packs/day for 42 years    Quit date: 05/28/2013  . Smokeless tobacco: Never Used  . Alcohol Use: No   OB History   Grav Para Term Preterm Abortions TAB SAB Ect Mult Living                 Review of Systems  Constitutional: Positive for fever and chills.  Respiratory: Negative for cough and shortness of breath.   Gastrointestinal: Negative for nausea, vomiting and abdominal pain.  All other systems reviewed and are negative.    Allergies  Betadine  Home Medications   Current Outpatient Rx  Name  Route  Sig  Dispense  Refill  . amLODipine (NORVASC) 10 MG tablet   Oral   Take 10 mg by mouth daily.         Marland Kitchen gabapentin (NEURONTIN) 300 MG capsule   Oral   Take 300 mg by mouth 3 (three) times daily.         Marland Kitchen HYDROmorphone (DILAUDID) 4 MG tablet   Oral   Take 2-4 mg by mouth every 4 (four) hours as needed for moderate pain or severe pain.         Marland Kitchen  ibuprofen (ADVIL) 200 MG tablet   Oral   Take 800 mg by mouth every 6 (six) hours as needed for mild pain or moderate pain.          Marland Kitchen insulin aspart (NOVOLOG) 100 UNIT/ML injection   Subcutaneous   Inject 10 Units into the skin 3 (three) times daily with meals.         . insulin glargine (LANTUS) 100 UNIT/ML injection   Subcutaneous   Inject 30 Units into the skin at bedtime.          Marland Kitchen lisinopril (PRINIVIL,ZESTRIL) 40 MG tablet      1 tablet by mouth daily   30 tablet   6   . metoprolol tartrate (LOPRESSOR) 25 MG tablet   Oral   Take 1 tablet (25 mg total) by mouth 2 (two) times daily.   180 tablet   3   . OxyCODONE (OXYCONTIN) 10 mg T12A 12 hr tablet   Oral   Take 10 mg by mouth 2 (two) times daily as needed (pain).           Marland Kitchen oxyCODONE-acetaminophen (PERCOCET/ROXICET) 5-325 MG per tablet      1 or 2 tabs PO q6h prn pain   25 tablet   0   . pantoprazole (PROTONIX) 40 MG tablet   Oral   Take 40 mg by mouth 2 (two) times daily.         . polyethylene glycol (MIRALAX / GLYCOLAX) packet   Oral   Take 17 g by mouth daily as needed for mild constipation or moderate constipation (constipation).          . potassium chloride (K-DUR,KLOR-CON) 10 MEQ tablet   Oral   Take 10 mEq by mouth daily.         . simethicone (MYLICON) 542 MG chewable tablet   Oral   Chew 125 mg by mouth every 6 (six) hours as needed for flatulence.         . simvastatin (ZOCOR) 10 MG tablet   Oral   Take 10 mg by mouth at bedtime.         . sucralfate (CARAFATE) 1 GM/10ML suspension   Oral   Take 10 mLs (1 g total) by mouth 2 (two) times daily.   420 mL   11   . traMADol (ULTRAM) 50 MG tablet   Oral   Take 50 mg by mouth every 6 (six) hours as needed for moderate pain.           BP 139/71  Pulse 123  Temp(Src) 104.9 F (40.5 C) (Oral)  Resp 25  SpO2 99% Physical Exam  Nursing note and vitals reviewed. Constitutional: She is oriented to person, place, and time. She appears well-developed and well-nourished. No distress.  HENT:  Head: Normocephalic and atraumatic.  Eyes: EOM are normal. Pupils are equal, round, and reactive to light.  Neck: Normal range of motion. Neck supple.  Cardiovascular: Regular rhythm.  Tachycardia present.  Exam reveals no friction rub.   No murmur heard. Pulmonary/Chest: Effort normal and breath sounds normal. No respiratory distress. She has no wheezes. She has no rales.  Abdominal: Soft. She exhibits no distension. There is no tenderness. There is no rebound.  Musculoskeletal: Normal range of motion. She exhibits no edema.  Neurological: She is alert and oriented to person, place, and time. No cranial nerve deficit. She exhibits normal muscle tone. Coordination normal.  Skin: No  rash noted. She is not diaphoretic.  ED Course  Procedures (including critical care time) Labs Review Labs Reviewed  CULTURE, BLOOD (ROUTINE X 2)  CULTURE, BLOOD (ROUTINE X 2)  URINE CULTURE  GLUCOSE, CAPILLARY  CBC WITH DIFFERENTIAL  COMPREHENSIVE METABOLIC PANEL  URINALYSIS, ROUTINE W REFLEX MICROSCOPIC   Imaging Review Ct Abdomen Pelvis W Contrast  12/22/2013   CLINICAL DATA:  Pancreatic carcinoma, fever, nausea  EXAM: CT ABDOMEN AND PELVIS WITH CONTRAST  TECHNIQUE: Multidetector CT imaging of the abdomen and pelvis was performed using the standard protocol following bolus administration of intravenous contrast.  CONTRAST:  46mL OMNIPAQUE IOHEXOL 300 MG/ML SOLN, 147mL OMNIPAQUE IOHEXOL 300 MG/ML SOLN  COMPARISON:  11/01/2013  FINDINGS: Subpleural scarring or dependent atelectasis in the visualized lung bases. Patent metallic biliary stent as indicated by pneumobilia. There is mild central intrahepatic biliary ductal dilatation. Poorly marginated pancreatic head mass abuts the duodenal sweep. 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 Mild dilatation of the pancreatic duct in the body and tail. Mild retroperitoneal peripancreatic inflammatory/ edematous changes as before. No new liver or peritoneal lesion. Unremarkable spleen, adrenal glands, right kidney. 2 cm thick-walled exophytic cystic lesion from the interpolar region left kidney as before. No hydronephrosis. Aortoiliac plaque. Stomach, small bowel, and colon are nondilated. Trace pelvic ascites. Coarse uterine calcification probably degenerative fibroid. Urinary bladder incompletely distended. Degenerative disc and facet disease L4-5.  IMPRESSION: 1. 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 2. Trace pelvic ascites.  No definite distal metastatic disease.   Electronically Signed   By: Arne Cleveland  M.D.   On: 12/22/2013 20:52   Dg Chest Port 1 View  12/22/2013   CLINICAL DATA:  Fever. Diabetes and hypertension. Radiation therapy for pancreatic carcinoma.  EXAM: PORTABLE CHEST - 1 VIEW  COMPARISON:  11/01/2013  FINDINGS: The heart size and mediastinal contours are within normal limits. Both lungs are clear. The visualized skeletal structures are unremarkable.  IMPRESSION: No active disease.   Electronically Signed   By: Earle Gell M.D.   On: 12/22/2013 17:05      MDM   1. Sepsis   2. Tachycardia   3. Fever    15F presents with fever. Hx of pancreatic cancer, had radiation and neoadjuvant chemotherapy. Had staging ERCPs for possible partial resections. Fever began today. No vomiting, no diarrhea. States possible dysuria. She is alert and oriented, but is acting mildly "spacey." No nuchal rigidity.  Here febrile, tachcyardic. Normotensive. Concern for possible sepsis, unknown source. Sepsis alert started, broad spectrum antibiotics initiated. Patient given fluids. Initial lactate elevated at 3.39.  HR improved as temperature correcting. Urine clean. CXR clear. Admitted to medicine for her sepsis, they requested CT abdomen, negative for acute findings.     Osvaldo Shipper, MD 12/22/13 2258

## 2013-12-22 NOTE — Telephone Encounter (Signed)
Every time the pt urinates or has a bowel movement she has "severe pressure" in her pelvic area.  Bowel movements are normal except for the pain.  100.4 temp today.  Chills.  She says she feels like she has "infection" in her body.  Please advise

## 2013-12-22 NOTE — H&P (Addendum)
[facility] History and Physical  [name] [name] HKV:[id number] DOB: [birth year] DOA: 12/22/2013  Referring physician: EDP PCP: Philis Fendt, MD   Chief Complaint: Fever   HPI: [name] [name] is a 58 y.o. female h/o pancreatic cancer undergoing chemo and radiation, who is brought in by family for 1 day history of fever and AMS.  Patients AMS improves in ED after her fever of 104.9 that she has on arrival is treated.  Patient states she has some abdominal pain but does not complain of any other symptoms.  She did have 1 large episode of "explosive diarrhea" after arrival according to RN.  Review of Systems: Systems reviewed.  As above, otherwise negative  Past Medical History  Diagnosis Date  . DM (diabetes mellitus), type 2     requires insulin  . Hypertension   . Depression   . Anxiety   . Hyperlipidemia   . Pancreatitis   . GERD (gastroesophageal reflux disease)   . Pancreatic cancer 05/2013    adenocarcinoma on ERCP/FNA  . Hearing impaired     lost the last of her hearing aids and cn not get another one.  hearing is better via right ear.   Marland Kitchen History of radiation therapy 07/20/13-08/09/13    Pancreas 37.5Gy  . DDD (degenerative disc disease), lumbar   . Chronic back pain    Past Surgical History  Procedure Laterality Date  . Appendectomy    . Ercp N/A 05/24/2013    Procedure: ENDOSCOPIC RETROGRADE CHOLANGIOPANCREATOGRAPHY (ERCP);  Surgeon: Milus Banister, MD;  Location: [place];  Service: Endoscopy;  Laterality: N/A;  MAC vs. general per anesthesia   . Tubal ligation    . Endoscopic retrograde cholangiopancreatography (ercp) with propofol N/A 10/21/2013    Procedure: ENDOSCOPIC RETROGRADE CHOLANGIOPANCREATOGRAPHY (ERCP) WITH PROPOFOL;  Surgeon: Milus Banister, MD;  Location: [facility];  Service: Endoscopy;  Laterality: N/A;  Stent removal   . Biliary stent placement N/A 10/21/2013    Procedure: BILIARY STENT PLACEMENT;  Surgeon: Milus Banister, MD;  Location: [facility];   Service: Endoscopy;  Laterality: N/A;   Social History:  reports that she quit smoking about 6 months ago. She has never used smokeless tobacco. She reports that she does not drink alcohol or use illicit drugs.  Allergies  Allergen Reactions  . Betadine [Povidone Iodine] Itching and Rash    Family History  Problem Relation Age of Onset  . Diabetes type II Mother   . Hypertension Mother   . Heart attack Father   . HIV/AIDS Brother      Prior to Admission medications   Medication Sig Start Date End Date Taking? Authorizing Provider  amLODipine (NORVASC) 10 MG tablet Take 10 mg by mouth daily.   Yes Historical Provider, MD  aspirin 81 MG tablet Take 81 mg by mouth daily.   Yes Historical Provider, MD  gabapentin (NEURONTIN) 300 MG capsule Take 300 mg by mouth 3 (three) times daily.   Yes Historical Provider, MD  HYDROmorphone (DILAUDID) 4 MG tablet Take 2-4 mg by mouth every 4 (four) hours as needed for moderate pain or severe pain. 07/15/13  Yes Owens Shark, NP  ibuprofen (ADVIL) 200 MG tablet Take 800 mg by mouth every 6 (six) hours as needed for mild pain or moderate pain.    Yes Historical Provider, MD  insulin aspart (NOVOLOG) 100 UNIT/ML injection Inject 10 Units into the skin 3 (three) times daily with meals.   Yes Historical Provider, MD  insulin glargine (LANTUS)  100 UNIT/ML injection Inject 30 Units into the skin at bedtime.    Yes Historical Provider, MD  lisinopril (PRINIVIL,ZESTRIL) 40 MG tablet 1 tablet by mouth daily 12/13/13  Yes Dorothy Spark, MD  OxyCODONE (OXYCONTIN) 10 mg T12A 12 hr tablet Take 10 mg by mouth 2 (two) times daily as needed (pain).  09/24/13  Yes Milus Banister, MD  oxyCODONE-acetaminophen (PERCOCET/ROXICET) 5-325 MG per tablet 1 or 2 tabs PO q6h prn pain 11/01/13  Yes Alfonzo Feller, DO  pantoprazole (PROTONIX) 40 MG tablet Take 40 mg by mouth 2 (two) times daily. 08/15/13  Yes Kelvin Cellar, MD  polyethylene glycol (MIRALAX / GLYCOLAX) packet  Take 17 g by mouth daily as needed for mild constipation or moderate constipation (constipation).    Yes Historical Provider, MD  potassium chloride (K-DUR,KLOR-CON) 10 MEQ tablet Take 10 mEq by mouth daily.   Yes Historical Provider, MD  simvastatin (ZOCOR) 10 MG tablet Take 10 mg by mouth at bedtime.   Yes Historical Provider, MD  sucralfate (CARAFATE) 1 GM/10ML suspension Take 10 mLs (1 g total) by mouth 2 (two) times daily. 10/21/13  Yes Milus Banister, MD  simethicone (MYLICON) 785 MG chewable tablet Chew 125 mg by mouth every 6 (six) hours as needed for flatulence.    Historical Provider, MD   Physical Exam: Filed Vitals:   12/22/13 1930  BP: 118/62  Pulse: 95  Temp:   Resp: 20    BP 118/62  Pulse 95  Temp(Src) 99.5 F (37.5 C) (Oral)  Resp 20  Ht 5\' 5"  (1.651 m)  Wt 68.493 kg (151 lb)  BMI 25.13 kg/m2  SpO2 98%  General Appearance:    Alert, oriented, no distress, appears stated age, still a little confused about what is going on around her.  Head:    Normocephalic, atraumatic  Eyes:    PERRL, EOMI, sclera non-icteric        Nose:   Nares without drainage or epistaxis. Mucosa, turbinates normal  Throat:   Moist mucous membranes. Oropharynx without erythema or exudate.  Neck:   Supple. No carotid bruits.  No thyromegaly.  No lymphadenopathy. No meningismus.  Back:     No CVA tenderness, no spinal tenderness  Lungs:     Clear to auscultation bilaterally, without wheezes, rhonchi or rales  Chest wall:    No tenderness to palpitation  Heart:    Regular rate and rhythm without murmurs, gallops, rubs  Abdomen:     Soft, very mild diffuse abdominal tenderness, no rebound, no guarding, no rigidity, nondistended, hyperactive bowel sounds, no organomegaly  Genitalia:    deferred  Rectal:    deferred  Extremities:   No clubbing, cyanosis or edema.  Pulses:   2+ and symmetric all extremities  Skin:   Skin color, texture, turgor normal, no rashes or lesions  Lymph nodes:    Cervical, supraclavicular, and axillary nodes normal  Neurologic:   CNII-XII intact. Normal strength, sensation and reflexes      throughout    Labs on Admission:  Basic Metabolic Panel:  Recent Labs Lab 12/22/13 1630  NA 139  K 3.5*  CL 104  CO2 17*  GLUCOSE 98  BUN 14  CREATININE 0.82  CALCIUM 9.2   Liver Function Tests:  Recent Labs Lab 12/22/13 1630  AST 62*  ALT 36*  ALKPHOS 202*  BILITOT 0.4  PROT 8.1  ALBUMIN 3.6   No results found for this basename: LIPASE, AMYLASE,  in the  last 168 hours No results found for this basename: AMMONIA,  in the last 168 hours CBC:  Recent Labs Lab 12/22/13 1630  WBC 9.6  NEUTROABS 9.3*  HGB 10.0*  HCT 31.3*  MCV 88.7  PLT 161   Cardiac Enzymes: No results found for this basename: CKTOTAL, CKMB, CKMBINDEX, TROPONINI,  in the last 168 hours  BNP (last 3 results) No results found for this basename: PROBNP,  in the last 8760 hours CBG:  Recent Labs Lab 12/22/13 1629  GLUCAP 93    Radiological Exams on Admission: Dg Chest Port 1 View  12/22/2013   CLINICAL DATA:  Fever. Diabetes and hypertension. Radiation therapy for pancreatic carcinoma.  EXAM: PORTABLE CHEST - 1 VIEW  COMPARISON:  11/01/2013  FINDINGS: The heart size and mediastinal contours are within normal limits. Both lungs are clear. The visualized skeletal structures are unremarkable.  IMPRESSION: No active disease.   Electronically Signed   By: Earle Gell M.D.   On: 12/22/2013 17:05    EKG: Independently reviewed.  Assessment/Plan Principal Problem:   Sepsis Active Problems:   DM (diabetes mellitus)   Abdominal pain, epigastric   1. Sepsis - likely abdominal source, CT abd/pelvis is pending, stool cultures and C.Diff pending (though per report diarrhea didn't smell like C.Diff).  Fever improved after tylenol, use tylenol for repeat episodes of fever.  On empiric zosyn and vanc at this time, due to severity of fever (104.9) will continue this at this  time.  No CXR findings to suggest PNA (nor symptoms of PNA), no UA findings to suggest UTI, no meningismus nor headache to suggest meningitis. 2. DM - putting patient on lantus 15 (takes lantus 30 at home) to prevent hypoglycemia, and adding low dose Q4H SSI.  Will need to increase insulin coverage when she resumes PO intake when feeling better.   Code Status: Full  Family Communication: No family in room Disposition Plan: Admit to inpatient   Time spent: 7 min  Troye Hiemstra M. [facility] Pager [phone]  If 7AM-7PM, please contact the day team taking care of the patient [email or web address] Password [email or web address] 12/22/2013, 8:10 PM

## 2013-12-22 NOTE — ED Notes (Signed)
MD at bedside. 

## 2013-12-22 NOTE — Progress Notes (Signed)
ANTIBIOTIC CONSULT NOTE - INITIAL  Pharmacy Consult for:  Vancomycin, Zosyn Indication:  Sepsis  Allergies  Allergen Reactions  . Betadine [Povidone Iodine] Itching and Rash    Patient Measurements: 12/13/13 - Height 65 inches   Weight 68.5 kg  Vital Signs: Temp: 104.9 F (40.5 C) (02/04 1637) Temp src: Oral (02/04 1637) BP: 139/71 mmHg (02/04 1637) Pulse Rate: 123 (02/04 1637)  Labs: The estimated creatinine clearance is 70.9 ml/min using SCr 0.8 and the Cockroft-Gault formula.   Microbiology: 12/22/13 - Blood culture pending  Medical History: Past Medical History  Diagnosis Date  . DM (diabetes mellitus), type 2     requires insulin  . Hypertension   . Depression   . Anxiety   . Hyperlipidemia   . Pancreatitis   . GERD (gastroesophageal reflux disease)   . Pancreatic cancer 05/2013    adenocarcinoma on ERCP/FNA  . Hearing impaired     lost the last of her hearing aids and cn not get another one.  hearing is better via right ear.   Marland Kitchen History of radiation therapy 07/20/13-08/09/13    Pancreas 37.5Gy  . DDD (degenerative disc disease), lumbar   . Chronic back pain     Medications:  Pending  Assessment: Asked to assist with Vancomycin and Zosyn therapy for this 58 year-old female with pancreatic adenocarcinoma, fever and possible sepsis with unknown source.  Goal of Therapy:   Vancomycin trough levels 15-20 mcg/ml  Eradication of infection  Plan:   First doses of Vancomycin 1 gram and Zosyn 3.375 grams were given in the ED.  Vancomycin 750 mg IV every 12 hours  Zosyn 3.375 grams IV every 8 hours, each dose infused over 4 hours.  Vancomycin levels as needed to guide dosing.  CrosbyPh. 12/22/2013 4:59 PM

## 2013-12-23 ENCOUNTER — Inpatient Hospital Stay (HOSPITAL_COMMUNITY): Payer: Medicaid Other

## 2013-12-23 DIAGNOSIS — R6510 Systemic inflammatory response syndrome (SIRS) of non-infectious origin without acute organ dysfunction: Secondary | ICD-10-CM

## 2013-12-23 DIAGNOSIS — R1013 Epigastric pain: Secondary | ICD-10-CM

## 2013-12-23 DIAGNOSIS — I10 Essential (primary) hypertension: Secondary | ICD-10-CM

## 2013-12-23 DIAGNOSIS — R509 Fever, unspecified: Secondary | ICD-10-CM

## 2013-12-23 DIAGNOSIS — C250 Malignant neoplasm of head of pancreas: Secondary | ICD-10-CM

## 2013-12-23 DIAGNOSIS — R000 Tachycardia, unspecified: Secondary | ICD-10-CM

## 2013-12-23 LAB — CBC
HCT: 27.7 % — ABNORMAL LOW (ref 36.0–46.0)
HEMOGLOBIN: 8.9 g/dL — AB (ref 12.0–15.0)
MCH: 28.9 pg (ref 26.0–34.0)
MCHC: 32.1 g/dL (ref 30.0–36.0)
MCV: 89.9 fL (ref 78.0–100.0)
Platelets: 123 10*3/uL — ABNORMAL LOW (ref 150–400)
RBC: 3.08 MIL/uL — ABNORMAL LOW (ref 3.87–5.11)
RDW: 14 % (ref 11.5–15.5)
WBC: 11.2 10*3/uL — AB (ref 4.0–10.5)

## 2013-12-23 LAB — CBC WITH DIFFERENTIAL/PLATELET
BASOS PCT: 0 % (ref 0–1)
Basophils Absolute: 0 10*3/uL (ref 0.0–0.1)
EOS ABS: 0.1 10*3/uL (ref 0.0–0.7)
EOS PCT: 1 % (ref 0–5)
HCT: 31 % — ABNORMAL LOW (ref 36.0–46.0)
HEMOGLOBIN: 9.9 g/dL — AB (ref 12.0–15.0)
LYMPHS ABS: 0.4 10*3/uL — AB (ref 0.7–4.0)
Lymphocytes Relative: 4 % — ABNORMAL LOW (ref 12–46)
MCH: 28.7 pg (ref 26.0–34.0)
MCHC: 31.9 g/dL (ref 30.0–36.0)
MCV: 89.9 fL (ref 78.0–100.0)
MONOS PCT: 2 % — AB (ref 3–12)
Monocytes Absolute: 0.2 10*3/uL (ref 0.1–1.0)
NEUTROS PCT: 94 % — AB (ref 43–77)
Neutro Abs: 10.3 10*3/uL — ABNORMAL HIGH (ref 1.7–7.7)
PLATELETS: 127 10*3/uL — AB (ref 150–400)
RBC: 3.45 MIL/uL — AB (ref 3.87–5.11)
RDW: 14.1 % (ref 11.5–15.5)
WBC: 11 10*3/uL — ABNORMAL HIGH (ref 4.0–10.5)

## 2013-12-23 LAB — BLOOD GAS, ARTERIAL
ACID-BASE DEFICIT: 8.2 mmol/L — AB (ref 0.0–2.0)
Bicarbonate: 14.7 mEq/L — ABNORMAL LOW (ref 20.0–24.0)
DRAWN BY: 257701
O2 Content: 2 L/min
O2 SAT: 92.3 %
PO2 ART: 85.3 mmHg (ref 80.0–100.0)
Patient temperature: 102.5
TCO2: 13.2 mmol/L (ref 0–100)
pCO2 arterial: 27 mmHg — ABNORMAL LOW (ref 35.0–45.0)
pH, Arterial: 7.368 (ref 7.350–7.450)

## 2013-12-23 LAB — BASIC METABOLIC PANEL
BUN: 13 mg/dL (ref 6–23)
BUN: 12 mg/dL (ref 6–23)
CO2: 19 meq/L (ref 19–32)
CO2: 17 mEq/L — ABNORMAL LOW (ref 19–32)
Calcium: 8.5 mg/dL (ref 8.4–10.5)
Calcium: 8.9 mg/dL (ref 8.4–10.5)
Chloride: 104 mEq/L (ref 96–112)
Chloride: 105 mEq/L (ref 96–112)
Creatinine, Ser: 0.83 mg/dL (ref 0.50–1.10)
Creatinine, Ser: 0.81 mg/dL (ref 0.50–1.10)
GFR calc Af Amer: 87 mL/min — ABNORMAL LOW (ref >90)
GFR calc non Af Amer: 75 mL/min — ABNORMAL LOW (ref >90)
GFR, EST AFRICAN AMERICAN: 90 mL/min — AB (ref >90)
GFR, EST NON AFRICAN AMERICAN: 77 mL/min — AB (ref >90)
Glucose, Bld: 241 mg/dL — ABNORMAL HIGH (ref 70–99)
Glucose, Bld: 165 mg/dL — ABNORMAL HIGH (ref 70–99)
Potassium: 3.3 mEq/L — ABNORMAL LOW (ref 3.7–5.3)
Potassium: 4.2 mEq/L (ref 3.7–5.3)
SODIUM: 136 meq/L — AB (ref 137–147)
Sodium: 139 mEq/L (ref 137–147)

## 2013-12-23 LAB — URINE CULTURE
CULTURE: NO GROWTH
Colony Count: NO GROWTH

## 2013-12-23 LAB — GLUCOSE, CAPILLARY
GLUCOSE-CAPILLARY: 144 mg/dL — AB (ref 70–99)
GLUCOSE-CAPILLARY: 163 mg/dL — AB (ref 70–99)
GLUCOSE-CAPILLARY: 205 mg/dL — AB (ref 70–99)
GLUCOSE-CAPILLARY: 308 mg/dL — AB (ref 70–99)
Glucose-Capillary: 152 mg/dL — ABNORMAL HIGH (ref 70–99)

## 2013-12-23 LAB — MRSA PCR SCREENING: MRSA by PCR: NEGATIVE

## 2013-12-23 LAB — CLOSTRIDIUM DIFFICILE BY PCR: Toxigenic C. Difficile by PCR: NEGATIVE

## 2013-12-23 LAB — LACTIC ACID, PLASMA
Lactic Acid, Venous: 4.6 mmol/L — ABNORMAL HIGH (ref 0.5–2.2)
Lactic Acid, Venous: 1.7 mmol/L (ref 0.5–2.2)

## 2013-12-23 MED ORDER — LORAZEPAM 2 MG/ML IJ SOLN
INTRAMUSCULAR | Status: AC
  Filled 2013-12-23: qty 1

## 2013-12-23 MED ORDER — SODIUM CHLORIDE 0.9 % IV BOLUS (SEPSIS)
250.0000 mL | INTRAVENOUS | Status: AC
  Administered 2013-12-23: 250 mL via INTRAVENOUS

## 2013-12-23 MED ORDER — GLUCERNA SHAKE PO LIQD
237.0000 mL | Freq: Two times a day (BID) | ORAL | Status: DC
  Administered 2013-12-24 – 2013-12-28 (×4): 237 mL via ORAL
  Filled 2013-12-23 (×15): qty 237

## 2013-12-23 MED ORDER — VANCOMYCIN HCL IN DEXTROSE 1-5 GM/200ML-% IV SOLN
1000.0000 mg | Freq: Two times a day (BID) | INTRAVENOUS | Status: DC
  Administered 2013-12-23 – 2013-12-24 (×2): 1000 mg via INTRAVENOUS
  Filled 2013-12-23 (×3): qty 200

## 2013-12-23 MED ORDER — LORAZEPAM 2 MG/ML IJ SOLN
1.0000 mg | Freq: Once | INTRAMUSCULAR | Status: AC
  Administered 2013-12-23: 1 mg via INTRAVENOUS

## 2013-12-23 MED ORDER — ACETAMINOPHEN 650 MG RE SUPP
650.0000 mg | Freq: Once | RECTAL | Status: AC
  Administered 2013-12-23: 650 mg via RECTAL
  Filled 2013-12-23 (×2): qty 1

## 2013-12-23 MED ORDER — POTASSIUM CHLORIDE CRYS ER 20 MEQ PO TBCR
30.0000 meq | EXTENDED_RELEASE_TABLET | Freq: Once | ORAL | Status: AC
  Administered 2013-12-23: 30 meq via ORAL
  Filled 2013-12-23: qty 1

## 2013-12-23 NOTE — Plan of Care (Signed)
Problem: Consults Goal: Diabetes Guidelines if Diabetic/Glucose > 140 If diabetic or lab glucose is > 140 mg/dl - Initiate Diabetes/Hyperglycemia Guidelines & Document Interventions  Outcome: Progressing Pt needs diabetes education, of why her diet is important and why and when to check her blood sugars. And the meds that she will be on why she is here in the hospital.

## 2013-12-23 NOTE — Progress Notes (Signed)
RN in room with patient and she started to cry and shake. She stated that she was "cold". BP 145/72, HR in the 120's sinus tach, shortness of breath noted. She was screaming "I can't calm down! I can't calm down!" MD was notified. MD came to room to see patient. Patient was very restless. Ativan was ordered and given. Rectal temp check and was 102.4. Tylenol suppository was ordered and given to patient. New orders placed for patient. She will be moving to ICU for now per MD. Report given to Langley Gauss, RN in ICU. Will continue to monitor patient. Setzer, Marchelle Folks

## 2013-12-23 NOTE — Progress Notes (Signed)
ANTIBIOTIC CONSULT NOTE - FOLLOW UP  Pharmacy Consult for Vancomycin and Zosyn Indication: Sepsis  Allergies  Allergen Reactions  . Betadine [Povidone Iodine] Itching and Rash    Patient Measurements: Height: 5\' 3"  (160 cm) (height stated by patient) Weight: 155 lb 10.3 oz (70.6 kg) IBW/kg (Calculated) : 52.4  Vital Signs: Temp: 102.4 F (39.1 C) (02/05 1153) Temp src: Rectal (02/05 1153) BP: 145/72 mmHg (02/05 1048) Pulse Rate: 90 (02/05 0409) Intake/Output from previous day: 02/04 0701 - 02/05 0700 In: 2300 [I.V.:2300] Out: -  Intake/Output from this shift:    Labs:  Recent Labs  12/22/13 1630 12/23/13 0350 12/23/13 1206  WBC 9.6 11.2* 11.0*  HGB 10.0* 8.9* 9.9*  PLT 161 123* 127*  CREATININE 0.82 0.83 0.81   Estimated Creatinine Clearance: 69.6 ml/min (by C-G formula based on Cr of 0.81).  80 ml/min/1.70m2 (normalized)  No results found for this basename: VANCOTROUGH, VANCOPEAK, VANCORANDOM, GENTTROUGH, GENTPEAK, GENTRANDOM, TOBRATROUGH, TOBRAPEAK, TOBRARND, AMIKACINPEAK, AMIKACINTROU, AMIKACIN,  in the last 72 hours   Microbiology: Recent Results (from the past 720 hour(s))  CULTURE, BLOOD (ROUTINE X 2)     Status: None   Collection Time    12/22/13  4:20 PM      Result Value Range Status   Specimen Description BLOOD LEFT ARM   Final   Special Requests BOTTLES DRAWN AEROBIC AND ANAEROBIC 3ML   Final   Culture  Setup Time     Final   Value: 12/22/2013 20:43     Performed at [facility]   Culture     Final   Value:        BLOOD CULTURE RECEIVED NO GROWTH TO DATE CULTURE WILL BE HELD FOR 5 DAYS BEFORE ISSUING A FINAL NEGATIVE REPORT     Performed at [facility]   Report Status PENDING   Incomplete  CULTURE, BLOOD (ROUTINE X 2)     Status: None   Collection Time    12/22/13  4:30 PM      Result Value Range Status   Specimen Description BLOOD LEFT FOREARM   Final   Special Requests BOTTLES DRAWN AEROBIC AND ANAEROBIC 3ML   Final   Culture  Setup Time     Final   Value: 12/22/2013 20:44     Performed at [facility]   Culture     Final   Value:        BLOOD CULTURE RECEIVED NO GROWTH TO DATE CULTURE WILL BE HELD FOR 5 DAYS BEFORE ISSUING A FINAL NEGATIVE REPORT     Performed at [facility]   Report Status PENDING   Incomplete  CLOSTRIDIUM DIFFICILE BY PCR     Status: None   Collection Time    12/23/13 12:19 AM      Result Value Range Status   C difficile by pcr NEGATIVE  NEGATIVE Final   Comment: Performed at [place]: 2/4 >> Vancomycin >> 2/4 >> Zosyn >>  Assessment: 58 y.o. female h/o pancreatic cancer undergoing chemo and radiation, admitted 2/4 with sepsis, source unclear.  Day #2 Vancomycin 750mg  q12h and Zosyn 3.375g IV q8h  Tmax 102.4  SCr stable, CrCl ~80 ml/min  WBC improved  Blood, urine and stool cultures pending; C.diff negative  Goal of Therapy:  Vancomycin trough level 15-20 mcg/ml Zosyn dose per renal function  Plan:   Increase Vancomycin to 1g IV q12h for current weight/renal function  Continue Zosyn 3.375gm IV q8h (  4hr extended infusions) Follow up renal function & cultures  Peggyann Juba, PharmD, BCPS Pager: [phone] 12/23/2013,1:19 PM

## 2013-12-23 NOTE — Progress Notes (Signed)
[facility] PROGRESS NOTE  Lurlean Kernen ONG:[id number] DOB: Nov 16, 1956 DOA: 12/22/2013 PCP: Philis Fendt, MD  Assessment/Plan: 1. Sepsis  1. unclear source 2. CT abd/pelvis without acute findings 3. C.Diff neg  4. Pt with chills, tachycardia, and continued ams. Lactate overnight was in the mid-3 range. Will repeat and continue volume resuscitate as tolerated. Lungs clear. Will obtain repeat chest xray 2. DM - putting patient on lantus 15 (takes lantus 30 at home) to prevent hypoglycemia, and adding low dose Q4H SSI. Will need to increase insulin coverage when she resumes PO intake when feeling better.  Code Status: Full Family Communication: Pt in room, discussed with daughter over phone Disposition Plan: Transfer to stepdown   Antibiotics:  Vancomycin  Zosyn  HPI/Subjective: Complains of increased chills. Staff reports continued mental status change.  Objective: Filed Vitals:   12/22/13 1816 12/22/13 1930 12/22/13 2123 12/23/13 0409  BP: 115/53 118/62 126/59 132/62  Pulse: 92 95 88 90  Temp: 99.5 F (37.5 C)  99.2 F (37.3 C) 98.7 F (37.1 C)  TempSrc: Oral  Oral Oral  Resp: 16 20 18 18   Height:   5\' 3"  (1.6 m)   Weight:   70.6 kg (155 lb 10.3 oz)   SpO2: 97% 98% 100% 99%    Intake/Output Summary (Last 24 hours) at 12/23/13 1146 Last data filed at 12/22/13 1911  Gross per 24 hour  Intake   2300 ml  Output      0 ml  Net   2300 ml   Filed Weights   12/22/13 1657 12/22/13 2123  Weight: 68.493 kg (151 lb) 70.6 kg (155 lb 10.3 oz)    Exam:   General:  Awake, in mild discomfort, appears anxious  Cardiovascular: tachycardic, s1, s2  Respiratory: normal resp effort, no wheezing or crackles  Abdomen: soft, nondistended  Musculoskeletal: perfused, no clubbing   Data Reviewed: Basic Metabolic Panel:  Recent Labs Lab 12/22/13 1630 12/23/13 0350  NA 139 136*  K 3.5* 3.3*  CL 104 104  CO2 17* 19  GLUCOSE 98 241*  BUN 14 13  CREATININE 0.82  0.83  CALCIUM 9.2 8.5   Liver Function Tests:  Recent Labs Lab 12/22/13 1630  AST 62*  ALT 36*  ALKPHOS 202*  BILITOT 0.4  PROT 8.1  ALBUMIN 3.6   No results found for this basename: LIPASE, AMYLASE,  in the last 168 hours No results found for this basename: AMMONIA,  in the last 168 hours CBC:  Recent Labs Lab 12/22/13 1630 12/23/13 0350  WBC 9.6 11.2*  NEUTROABS 9.3*  --   HGB 10.0* 8.9*  HCT 31.3* 27.7*  MCV 88.7 89.9  PLT 161 123*   Cardiac Enzymes: No results found for this basename: CKTOTAL, CKMB, CKMBINDEX, TROPONINI,  in the last 168 hours BNP (last 3 results) No results found for this basename: PROBNP,  in the last 8760 hours CBG:  Recent Labs Lab 12/22/13 1629 12/23/13 12/23/13 0406 12/23/13 0742 12/23/13 1107  GLUCAP 93 308* 205* 152* 163*    Recent Results (from the past 240 hour(s))  CULTURE, BLOOD (ROUTINE X 2)     Status: None   Collection Time    12/22/13  4:20 PM      Result Value Range Status   Specimen Description BLOOD LEFT ARM   Final   Special Requests BOTTLES DRAWN AEROBIC AND ANAEROBIC 3ML   Final   Culture  Setup Time     Final   Value: 12/22/2013 20:43  Performed at [facility]     Final   Value:        BLOOD CULTURE RECEIVED NO GROWTH TO DATE CULTURE WILL BE HELD FOR 5 DAYS BEFORE ISSUING A FINAL NEGATIVE REPORT     Performed at [facility]   Report Status PENDING   Incomplete  CULTURE, BLOOD (ROUTINE X 2)     Status: None   Collection Time    12/22/13  4:30 PM      Result Value Range Status   Specimen Description BLOOD LEFT FOREARM   Final   Special Requests BOTTLES DRAWN AEROBIC AND ANAEROBIC 3ML   Final   Culture  Setup Time     Final   Value: 12/22/2013 20:44     Performed at [facility]   Culture     Final   Value:        BLOOD CULTURE RECEIVED NO GROWTH TO DATE CULTURE WILL BE HELD FOR 5 DAYS BEFORE ISSUING A FINAL NEGATIVE REPORT     Performed at [facility]    Report Status PENDING   Incomplete     Studies: Ct Abdomen Pelvis W Contrast  12/22/2013   CLINICAL DATA:  Pancreatic carcinoma, fever, nausea  EXAM: CT ABDOMEN AND PELVIS WITH CONTRAST  TECHNIQUE: Multidetector CT imaging of the abdomen and pelvis was performed using the standard protocol following bolus administration of intravenous contrast.  CONTRAST:  82mL OMNIPAQUE IOHEXOL 300 MG/ML SOLN, 117mL OMNIPAQUE IOHEXOL 300 MG/ML SOLN  COMPARISON:  11/01/2013  FINDINGS: Subpleural scarring or dependent atelectasis in the visualized lung bases. Patent metallic biliary stent as indicated by pneumobilia. There is mild central intrahepatic biliary ductal dilatation. Poorly marginated pancreatic head mass abuts the duodenal sweep. 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 Mild dilatation of the pancreatic duct in the body and tail. Mild retroperitoneal peripancreatic inflammatory/ edematous changes as before. No new liver or peritoneal lesion. Unremarkable spleen, adrenal glands, right kidney. 2 cm thick-walled exophytic cystic lesion from the interpolar region left kidney as before. No hydronephrosis. Aortoiliac plaque. Stomach, small bowel, and colon are nondilated. Trace pelvic ascites. Coarse uterine calcification probably degenerative fibroid. Urinary bladder incompletely distended. Degenerative disc and facet disease L4-5.  IMPRESSION: 1. 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 2. Trace pelvic ascites.  No definite distal metastatic disease.   Electronically Signed   By: Arne Cleveland M.D.   On: 12/22/2013 20:52   Dg Chest Port 1 View  12/22/2013   CLINICAL DATA:  Fever. Diabetes and hypertension. Radiation therapy for pancreatic carcinoma.  EXAM: PORTABLE CHEST - 1 VIEW  COMPARISON:  11/01/2013  FINDINGS: The heart size and mediastinal contours are within normal  limits. Both lungs are clear. The visualized skeletal structures are unremarkable.  IMPRESSION: No active disease.   Electronically Signed   By: Earle Gell M.D.   On: 12/22/2013 17:05    Scheduled Meds: . amLODipine  10 mg Oral Daily  . aspirin  81 mg Oral Daily  . gabapentin  300 mg Oral TID  . heparin  5,000 Units Subcutaneous Q8H  . insulin aspart  0-9 Units Subcutaneous Q4H  . insulin glargine  15 Units Subcutaneous QHS  . lisinopril  40 mg Oral Daily  . LORazepam      . pantoprazole  40 mg Oral BID  . piperacillin-tazobactam (ZOSYN)  IV  3.375 g Intravenous Q8H  . potassium chloride  10 mEq Oral Daily  . simvastatin  10 mg Oral QHS  . sodium chloride  3 mL Intravenous Q12H  . sucralfate  1 g Oral BID  . vancomycin  750 mg Intravenous Q12H   Continuous Infusions: . sodium chloride 1,000 mL (12/22/13 2316)    Principal Problem:   Sepsis Active Problems:   DM (diabetes mellitus)   Abdominal pain, epigastric    Time spent: 91min    Malon Siddall, [facility] Pager [phone]. If 7PM-7AM, please contact night-coverage at [email or web address], password [facility] 12/23/2013, 11:46 AM  LOS: 1 day

## 2013-12-23 NOTE — Progress Notes (Signed)
INITIAL NUTRITION ASSESSMENT  DOCUMENTATION CODES Per approved criteria  -Not Applicable   INTERVENTION: -Recommend addition of Glucerna Shake po BID, each supplement provides 220 kcal and 10 grams of protein -Will continue to monitor  NUTRITION DIAGNOSIS: Inadequate oral intake related to AMS as evidenced by PO intake 0%.   Goal: Pt to meet >/= 90% of their estimated nutrition needs    Monitor:  Total protein/energy intake, labs, weights, GI profile,Glucose profile,   Reason for Assessment: MST  58 y.o. female  Admitting Dx: Sepsis  ASSESSMENT: [name] [name] is a 58 y.o. female h/o pancreatic cancer undergoing chemo and radiation, who is brought in by family for 1 day history of fever and AMS. Patients AMS improves in ED after her fever of 104.9 that she has on arrival is treated. Patient states she has some abdominal pain but does not complain of any other symptoms. She did have 1 large episode of "explosive diarrhea" after arrival according to RN.  -Attempted to gain further nutrition/food hx from patient. On first attempt, pt was lethargic and did not respond to questions -On second attempt, pt was largely concerned with adjusting herself in bed and back pain. Did not want to respond to questions regarding weight and/or appetite changes -Has had 0% PO intake per discussion with RN and Nurse Techs. Plan to transfer to ICU per RN -Pt has been evaluated by inpatient RD in 07/2013 and outpatient oncology dietitian in 06/2013. Reported usual body weight of 149-151 lbs. Drinks Boost occasionally. Was eating well with some nausea. Will continue to monitor.  -Will order Glucerna shakes d/t hx of DM2 -Weight trend indicates pt weight stays within 149-155 per previous medical records -Admitted with sepsis and 104.69F fevers   Height: Ht Readings from Last 1 Encounters:  12/22/13 5\' 3"  (1.6 m)    Weight: Wt Readings from Last 1 Encounters:  12/22/13 155 lb 10.3 oz (70.6 kg)     Ideal Body Weight: 115 lbs  % Ideal Body Weight: 134%  Wt Readings from Last 10 Encounters:  12/22/13 155 lb 10.3 oz (70.6 kg)  12/13/13 151 lb (68.493 kg)  12/06/13 153 lb 4.8 oz (69.536 kg)  11/01/13 149 lb (67.586 kg)  10/22/13 153 lb 3.5 oz (69.5 kg)  10/22/13 153 lb 3.5 oz (69.5 kg)  10/20/13 152 lb 4.8 oz (69.083 kg)  10/12/13 149 lb (67.586 kg)  10/12/13 151 lb (68.493 kg)  10/06/13 149 lb (67.586 kg)    Usual Body Weight: 149-151 lbs  % Usual Body Weight: 103%  BMI:  Body mass index is 27.58 kg/(m^2).  Estimated Nutritional Needs: Kcal: 1500-1700 Protein: 105-115 gram Fluid: >/= 2100 ml/daily  Skin: WDL  Diet Order: Carb Control  EDUCATION NEEDS: -Education not appropriate at this time   Intake/Output Summary (Last 24 hours) at 12/23/13 1238 Last data filed at 12/22/13 1911  Gross per 24 hour  Intake   2300 ml  Output      0 ml  Net   2300 ml    Last BM: 2/05   Labs:   Recent Labs Lab 12/22/13 1630 12/23/13 0350  NA 139 136*  K 3.5* 3.3*  CL 104 104  CO2 17* 19  BUN 14 13  CREATININE 0.82 0.83  CALCIUM 9.2 8.5  GLUCOSE 98 241*    CBG (last 3)   Recent Labs  12/23/13 0406 12/23/13 0742 12/23/13 1107  GLUCAP 205* 152* 163*    Scheduled Meds: . amLODipine  10 mg Oral Daily  .  aspirin  81 mg Oral Daily  . gabapentin  300 mg Oral TID  . heparin  5,000 Units Subcutaneous Q8H  . insulin aspart  0-9 Units Subcutaneous Q4H  . insulin glargine  15 Units Subcutaneous QHS  . lisinopril  40 mg Oral Daily  . pantoprazole  40 mg Oral BID  . piperacillin-tazobactam (ZOSYN)  IV  3.375 g Intravenous Q8H  . potassium chloride  10 mEq Oral Daily  . simvastatin  10 mg Oral QHS  . sodium chloride  3 mL Intravenous Q12H  . sucralfate  1 g Oral BID  . vancomycin  750 mg Intravenous Q12H    Continuous Infusions: . sodium chloride 1,000 mL (12/22/13 2316)    Past Medical History  Diagnosis Date  . DM (diabetes mellitus), type 2      requires insulin  . Hypertension   . Depression   . Anxiety   . Hyperlipidemia   . Pancreatitis   . GERD (gastroesophageal reflux disease)   . Pancreatic cancer 05/2013    adenocarcinoma on ERCP/FNA  . Hearing impaired     lost the last of her hearing aids and cn not get another one.  hearing is better via right ear.   Marland Kitchen History of radiation therapy 07/20/13-08/09/13    Pancreas 37.5Gy  . DDD (degenerative disc disease), lumbar   . Chronic back pain     Past Surgical History  Procedure Laterality Date  . Appendectomy    . Ercp N/A 05/24/2013    Procedure: ENDOSCOPIC RETROGRADE CHOLANGIOPANCREATOGRAPHY (ERCP);  Surgeon: Milus Banister, MD;  Location: [facility];  Service: Endoscopy;  Laterality: N/A;  MAC vs. general per anesthesia   . Tubal ligation    . Endoscopic retrograde cholangiopancreatography (ercp) with propofol N/A 10/21/2013    Procedure: ENDOSCOPIC RETROGRADE CHOLANGIOPANCREATOGRAPHY (ERCP) WITH PROPOFOL;  Surgeon: Milus Banister, MD;  Location: [facility];  Service: Endoscopy;  Laterality: N/A;  Stent removal   . Biliary stent placement N/A 10/21/2013    Procedure: BILIARY STENT PLACEMENT;  Surgeon: Milus Banister, MD;  Location: [facility];  Service: Endoscopy;  Laterality: N/A;     Atlee Abide MS RD LDN Clinical Dietitian [phone]

## 2013-12-23 NOTE — Plan of Care (Signed)
Problem: Consults Goal: General Medical Patient Education See Patient Education Module for specific education. Outcome: Progressing Instructed patient on meds that she has ordered for while she is here. Pt voiced some understanding but still needs reinforcing of medication education.

## 2013-12-23 NOTE — Progress Notes (Signed)
CRITICAL VALUE ALERT  Critical value received:  Blood Cultures :Gram + cocci in pairs and chains  Date of notification:  12/23/13  Time of notification:  2033  Critical value read back:yes  Nurse who received alert:  Luciano Cutter  MD notified (1st page):  L. Easterwood  Time of first page:  2106  MD notified (2nd page):  Time of second page:  Responding MD:  Vivien Presto  Time MD responded:  2107

## 2013-12-24 ENCOUNTER — Encounter (INDEPENDENT_AMBULATORY_CARE_PROVIDER_SITE_OTHER): Payer: Medicaid Other | Admitting: General Surgery

## 2013-12-24 DIAGNOSIS — K831 Obstruction of bile duct: Secondary | ICD-10-CM

## 2013-12-24 DIAGNOSIS — R0789 Other chest pain: Secondary | ICD-10-CM

## 2013-12-24 DIAGNOSIS — R7881 Bacteremia: Secondary | ICD-10-CM

## 2013-12-24 LAB — COMPREHENSIVE METABOLIC PANEL
ALT: 64 U/L — ABNORMAL HIGH (ref 0–35)
AST: 90 U/L — AB (ref 0–37)
Albumin: 2.6 g/dL — ABNORMAL LOW (ref 3.5–5.2)
Alkaline Phosphatase: 176 U/L — ABNORMAL HIGH (ref 39–117)
BILIRUBIN TOTAL: 0.7 mg/dL (ref 0.3–1.2)
BUN: 17 mg/dL (ref 6–23)
CALCIUM: 8.7 mg/dL (ref 8.4–10.5)
CO2: 18 mEq/L — ABNORMAL LOW (ref 19–32)
CREATININE: 1.31 mg/dL — AB (ref 0.50–1.10)
Chloride: 108 mEq/L (ref 96–112)
GFR calc Af Amer: 50 mL/min — ABNORMAL LOW (ref >90)
GFR calc non Af Amer: 43 mL/min — ABNORMAL LOW (ref >90)
Glucose, Bld: 125 mg/dL — ABNORMAL HIGH (ref 70–99)
Potassium: 3.5 mEq/L — ABNORMAL LOW (ref 3.7–5.3)
SODIUM: 139 meq/L (ref 137–147)
Total Protein: 6.2 g/dL (ref 6.0–8.3)

## 2013-12-24 LAB — CBC WITH DIFFERENTIAL/PLATELET
BASOS ABS: 0 10*3/uL (ref 0.0–0.1)
Basophils Relative: 0 % (ref 0–1)
Eosinophils Absolute: 0 10*3/uL (ref 0.0–0.7)
Eosinophils Relative: 0 % (ref 0–5)
HCT: 28.1 % — ABNORMAL LOW (ref 36.0–46.0)
Hemoglobin: 9.4 g/dL — ABNORMAL LOW (ref 12.0–15.0)
LYMPHS ABS: 0.8 10*3/uL (ref 0.7–4.0)
Lymphocytes Relative: 5 % — ABNORMAL LOW (ref 12–46)
MCH: 29.5 pg (ref 26.0–34.0)
MCHC: 33.5 g/dL (ref 30.0–36.0)
MCV: 88.1 fL (ref 78.0–100.0)
MONOS PCT: 5 % (ref 3–12)
Monocytes Absolute: 0.8 10*3/uL (ref 0.1–1.0)
NEUTROS PCT: 90 % — AB (ref 43–77)
Neutro Abs: 13.5 10*3/uL — ABNORMAL HIGH (ref 1.7–7.7)
PLATELETS: 101 10*3/uL — AB (ref 150–400)
RBC: 3.19 MIL/uL — ABNORMAL LOW (ref 3.87–5.11)
RDW: 14.2 % (ref 11.5–15.5)
WBC: 15.1 10*3/uL — AB (ref 4.0–10.5)

## 2013-12-24 LAB — GLUCOSE, CAPILLARY
GLUCOSE-CAPILLARY: 133 mg/dL — AB (ref 70–99)
GLUCOSE-CAPILLARY: 286 mg/dL — AB (ref 70–99)
Glucose-Capillary: 128 mg/dL — ABNORMAL HIGH (ref 70–99)
Glucose-Capillary: 129 mg/dL — ABNORMAL HIGH (ref 70–99)
Glucose-Capillary: 132 mg/dL — ABNORMAL HIGH (ref 70–99)
Glucose-Capillary: 156 mg/dL — ABNORMAL HIGH (ref 70–99)
Glucose-Capillary: 164 mg/dL — ABNORMAL HIGH (ref 70–99)
Glucose-Capillary: 245 mg/dL — ABNORMAL HIGH (ref 70–99)

## 2013-12-24 LAB — VANCOMYCIN, TROUGH: Vancomycin Tr: 18.9 ug/mL (ref 10.0–20.0)

## 2013-12-24 MED ORDER — VANCOMYCIN HCL IN DEXTROSE 750-5 MG/150ML-% IV SOLN
750.0000 mg | Freq: Once | INTRAVENOUS | Status: AC
  Administered 2013-12-24: 750 mg via INTRAVENOUS
  Filled 2013-12-24: qty 150

## 2013-12-24 NOTE — Progress Notes (Signed)
[facility] PROGRESS NOTE  [name] [name] [id number] DOB: [birth year] DOA: 12/22/2013 PCP: Philis Fendt, MD  Assessment/Plan: 1. Sepsis  1. Gram pos bacteremia 2. CT abd/pelvis without acute findings 3. Pt admits to frequently re-using insulin syringes/needles - source? 4. Pt is s/p biliary stent placement several months ago, doubt source given timing  5. C.Diff neg  6. Pt with peak lactate of over 4.5, resolved with IVF and abx 7. Pt appears to be improving, currently afebrile 2. DM - putting patient on lantus 15 (takes lantus 30 at home) to prevent hypoglycemia, and adding low dose Q4H SSI. Will need to increase insulin coverage when she resumes PO intake when feeling better.  Code Status: Full Family Communication: Pt in room, discussed with daughter over phone. Daughter and pt both in agreement with current plan of care. Disposition Plan: Pending  Antibiotics:  Vancomycin 12/23/13>>>  Zosyn 12/23/13>>>  HPI/Subjective: Reports feeling better. Complains of continued chronic back pain.  Objective: Filed Vitals:   12/24/13 0356 12/24/13 0406 12/24/13 0528 12/24/13 0800  BP:  143/76    Pulse:  70    Temp: 98.6 F (37 C)   97.9 F (36.6 C)  TempSrc: Oral   Oral  Resp:  18    Height:      Weight:   73.1 kg (161 lb 2.5 oz)   SpO2:  100%      Intake/Output Summary (Last 24 hours) at 12/24/13 0952 Last data filed at 12/24/13 0800  Gross per 24 hour  Intake 4764.58 ml  Output    146 ml  Net 4618.58 ml   Filed Weights   12/22/13 1657 12/22/13 2123 12/24/13 0528  Weight: 68.493 kg (151 lb) 70.6 kg (155 lb 10.3 oz) 73.1 kg (161 lb 2.5 oz)    Exam:   General:  Awake, in mild discomfort, appears anxious  Cardiovascular: tachycardic, s1, s2  Respiratory: normal resp effort, no wheezing or crackles  Abdomen: soft, nondistended  Musculoskeletal: perfused, no clubbing   Data Reviewed: Basic Metabolic Panel:  Recent Labs Lab 12/22/13 1630 12/23/13 0350  12/23/13 1206  NA 139 136* 139  K 3.5* 3.3* 4.2  CL 104 104 105  CO2 17* 19 17*  GLUCOSE 98 241* 165*  BUN 14 13 12   CREATININE 0.82 0.83 0.81  CALCIUM 9.2 8.5 8.9   Liver Function Tests:  Recent Labs Lab 12/22/13 1630  AST 62*  ALT 36*  ALKPHOS 202*  BILITOT 0.4  PROT 8.1  ALBUMIN 3.6   No results found for this basename: LIPASE, AMYLASE,  in the last 168 hours No results found for this basename: AMMONIA,  in the last 168 hours CBC:  Recent Labs Lab 12/22/13 1630 12/23/13 0350 12/23/13 1206  WBC 9.6 11.2* 11.0*  NEUTROABS 9.3*  --  10.3*  HGB 10.0* 8.9* 9.9*  HCT 31.3* 27.7* 31.0*  MCV 88.7 89.9 89.9  PLT 161 123* 127*   Cardiac Enzymes: No results found for this basename: CKTOTAL, CKMB, CKMBINDEX, TROPONINI,  in the last 168 hours BNP (last 3 results) No results found for this basename: PROBNP,  in the last 8760 hours CBG:  Recent Labs Lab 12/23/13 1622 12/23/13 1958 12/23/13 2324 12/24/13 0353 12/24/13 0750  GLUCAP 144* 132* 156* 128* 133*    Recent Results (from the past 240 hour(s))  CULTURE, BLOOD (ROUTINE X 2)     Status: None   Collection Time    12/22/13  4:20 PM      Result Value Range  Status   Specimen Description BLOOD LEFT ARM   Final   Special Requests BOTTLES DRAWN AEROBIC AND ANAEROBIC 3ML   Final   Culture  Setup Time     Final   Value: 12/22/2013 20:43     Performed at [facility]   Culture     Final   Value: GRAM POSITIVE COCCI IN PAIRS AND CHAINS     Note: Gram Stain Report Called to,Read Back By and Verified With: TIFFANY BANKS ON 12/24/2013 AT 12:10A BY WILEJ     Performed at [facility]   Report Status PENDING   Incomplete  CULTURE, BLOOD (ROUTINE X 2)     Status: None   Collection Time    12/22/13  4:30 PM      Result Value Range Status   Specimen Description BLOOD LEFT FOREARM   Final   Special Requests BOTTLES DRAWN AEROBIC AND ANAEROBIC 3ML   Final   Culture  Setup Time     Final   Value:  12/22/2013 20:44     Performed at [facility]   Culture     Final   Value: GRAM POSITIVE COCCI IN PAIRS AND CHAINS     Note: Gram Stain Report Called to,Read Back By and Verified With: Darrin Nipper ON 12/23/2013 AT 8:33P BY WILEJ     Performed at [facility]   Report Status PENDING   Incomplete  URINE CULTURE     Status: None   Collection Time    12/22/13  7:04 PM      Result Value Range Status   Specimen Description URINE, CATHETERIZED   Final   Special Requests NONE   Final   Culture  Setup Time     Final   Value: 12/22/2013 21:38     Performed at [place]     Final   Value: NO GROWTH     Performed at [facility]   Culture     Final   Value: NO GROWTH     Performed at [facility]   Report Status 12/23/2013 FINAL   Final  CLOSTRIDIUM DIFFICILE BY PCR     Status: None   Collection Time    12/23/13 12:19 AM      Result Value Range Status   C difficile by pcr NEGATIVE  NEGATIVE Final   Comment: Performed at [place]     Status: None   Collection Time    12/23/13 12:19 AM      Result Value Range Status   Specimen Description STOOL   Final   Special Requests NONE   Final   Culture     Final   Value: NO SUSPICIOUS COLONIES, CONTINUING TO HOLD     Performed at [facility]   Report Status PENDING   Incomplete  MRSA PCR SCREENING     Status: None   Collection Time    12/23/13  1:20 PM      Result Value Range Status   MRSA by PCR NEGATIVE  NEGATIVE Final   Comment:            The GeneXpert MRSA Assay (FDA     approved for NASAL specimens     only), is one component of a     comprehensive MRSA colonization     surveillance program. It is not     intended to diagnose MRSA  infection nor to guide or     monitor treatment for     MRSA infections.     Studies: Ct Abdomen Pelvis W Contrast  12/22/2013   CLINICAL DATA:  Pancreatic carcinoma, fever, nausea  EXAM: CT ABDOMEN  AND PELVIS WITH CONTRAST  TECHNIQUE: Multidetector CT imaging of the abdomen and pelvis was performed using the standard protocol following bolus administration of intravenous contrast.  CONTRAST:  63mL OMNIPAQUE IOHEXOL 300 MG/ML SOLN, 155mL OMNIPAQUE IOHEXOL 300 MG/ML SOLN  COMPARISON:  11/01/2013  FINDINGS: Subpleural scarring or dependent atelectasis in the visualized lung bases. Patent metallic biliary stent as indicated by pneumobilia. There is mild central intrahepatic biliary ductal dilatation. Poorly marginated pancreatic head mass abuts the duodenal sweep. 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 Mild dilatation of the pancreatic duct in the body and tail. Mild retroperitoneal peripancreatic inflammatory/ edematous changes as before. No new liver or peritoneal lesion. Unremarkable spleen, adrenal glands, right kidney. 2 cm thick-walled exophytic cystic lesion from the interpolar region left kidney as before. No hydronephrosis. Aortoiliac plaque. Stomach, small bowel, and colon are nondilated. Trace pelvic ascites. Coarse uterine calcification probably degenerative fibroid. Urinary bladder incompletely distended. Degenerative disc and facet disease L4-5.  IMPRESSION: 1. 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 2. Trace pelvic ascites.  No definite distal metastatic disease.   Electronically Signed   By: Arne Cleveland M.D.   On: 12/22/2013 20:52   Dg Chest Port 1 View  12/22/2013   CLINICAL DATA:  Fever. Diabetes and hypertension. Radiation therapy for pancreatic carcinoma.  EXAM: PORTABLE CHEST - 1 VIEW  COMPARISON:  11/01/2013  FINDINGS: The heart size and mediastinal contours are within normal limits. Both lungs are clear. The visualized skeletal structures are unremarkable.  IMPRESSION: No active disease.   Electronically Signed   By: Earle Gell M.D.   On:  12/22/2013 17:05   Dg Abd Portable 1v  12/23/2013   CLINICAL DATA:  59 year old female with sepsis, diarrhea. Initial encounter. Pancreatic tumor.  EXAM: PORTABLE ABDOMEN - 1 VIEW  COMPARISON:  CT Abdomen and Pelvis 12/22/2013.  FINDINGS: Portable AP supine view at 1223 hrs. Metallic CBD stent re- identified. Stable, non obstructed bowel gas pattern. Mildly increased retained stool in the distal colon. No acute osseous abnormality identified. No definite pneumoperitoneum on this supine view.  IMPRESSION: 1. Stable, non obstructed bowel gas pattern. 2. Metallic CBD stent, related to pancreatic tumor therapy.   Electronically Signed   By: Lars Pinks M.D.   On: 12/23/2013 12:52    Scheduled Meds: . amLODipine  10 mg Oral Daily  . aspirin  81 mg Oral Daily  . feeding supplement (GLUCERNA SHAKE)  237 mL Oral BID WC  . gabapentin  300 mg Oral TID  . heparin  5,000 Units Subcutaneous Q8H  . insulin aspart  0-9 Units Subcutaneous Q4H  . insulin glargine  15 Units Subcutaneous QHS  . lisinopril  40 mg Oral Daily  . pantoprazole  40 mg Oral BID  . piperacillin-tazobactam (ZOSYN)  IV  3.375 g Intravenous Q8H  . potassium chloride  10 mEq Oral Daily  . simvastatin  10 mg Oral QHS  . sodium chloride  3 mL Intravenous Q12H  . sucralfate  1 g Oral BID  . vancomycin  1,000 mg Intravenous Q12H   Continuous Infusions: . sodium chloride 1,000 mL (12/23/13 2001)    Principal  Problem:   Sepsis Active Problems:   DM (diabetes mellitus)   Abdominal pain, epigastric    Time spent: 68min    Thurmond Hildebran, [facility] Pager [phone]. If 7PM-7AM, please contact night-coverage at [email or web address], password [email or web address] 12/24/2013, 9:52 AM  LOS: 2 days

## 2013-12-24 NOTE — Clinical Documentation Improvement (Signed)
  ED Note states some "confusion" and Progress Note 2/5 states "continued ams". In the Coding world "AMS" is considered nonspecific and low weighted. Please clarify the term "AMS" to illustrate severity of illness and risk of mortality. Thank you.  Possible Clinical Conditions?  - Encephalopathy (describe type if known)                       Anoxic                       Septic                       Alcoholic                        Hepatic                       Hypertensive                       Metabolic                       Toxic - Hyponatremia / Hypernatremia - Poisoning / Overdose - Other Condition  Thank You, Ezekiel Ina ,RN Clinical Documentation Specialist:  [phone]  [facility] Information Management

## 2013-12-24 NOTE — Progress Notes (Signed)
CARE MANAGEMENT NOTE 12/24/2013  Patient:  [name] [name], [name] [name]   Account Number:  [id number]  Date Initiated:  12/24/2013  Documentation initiated by:  Chanee Henrickson  Subjective/Objective Assessment:   pt with temp 102.4, hypotensive, sepsis, recent hx of biliary stent placement for pancreatic ca.     Action/Plan:   home when stable/lives at home with support for her children.   Anticipated DC Date:  12/27/2013   Anticipated DC Plan:  HOME/SELF CARE  In-house referral  NA      DC Planning Services  NA      [facility] Choice  NA   Choice offered to / List presented to:  NA   DME arranged  NA      DME agency  NA     [place] arranged  NA      [place] agency  NA   Status of service:  In process, will continue to follow Medicare Important Message given?  NA - LOS <3 / Initial given by admissions (If response is "NO", the following Medicare IM given date fields will be blank) Date Medicare IM given:   Date Additional Medicare IM given:    Discharge Disposition:    Per UR Regulation:  Reviewed for med. necessity/level of care/duration of stay  If discussed at [place] of Stay Meetings, dates discussed:    Comments:  [id number]/Xane Amsden Eldridge Dace, [place], [place] [phone] Chart Reviewed for discharge and hospital needs. Discharge needs at time of review:  None present will follow for needs. Review of patient progress due on [id number].

## 2013-12-24 NOTE — Progress Notes (Signed)
ANTIBIOTIC CONSULT NOTE - FOLLOW UP  Pharmacy Consult for Vancomycin and Zosyn Indication: Sepsis  Allergies  Allergen Reactions  . Betadine [Povidone Iodine] Itching and Rash    Patient Measurements: Height: 5\' 3"  (160 cm) (height stated by patient) Weight: 161 lb 2.5 oz (73.1 kg) IBW/kg (Calculated) : 52.4  Vital Signs: Temp: 97.9 F (36.6 C) (02/06 1200) Temp src: Oral (02/06 1200) BP: 162/71 mmHg (02/06 1137) Pulse Rate: 82 (02/06 0823) Intake/Output from previous day: 02/05 0701 - 02/06 0700 In: 4239.6 [I.V.:4039.6; IV Piggyback:200] Out: 1 [Stool:1] Intake/Output from this shift: Total I/O In: 1075 [I.V.:625; IV Piggyback:450] Out: 447 [Urine:446; Stool:1]  Labs:  Recent Labs  12/23/13 0350 12/23/13 1206 12/24/13 0950  WBC 11.2* 11.0* 15.1*  HGB 8.9* 9.9* 9.4*  PLT 123* 127* 101*  CREATININE 0.83 0.81 1.31*   Estimated Creatinine Clearance: 43.8 ml/min (by C-G formula based on Cr of 1.31).  80 ml/min/1.33m2 (normalized)  No results found for this basename: VANCOTROUGH, VANCOPEAK, VANCORANDOM, GENTTROUGH, GENTPEAK, GENTRANDOM, TOBRATROUGH, TOBRAPEAK, TOBRARND, AMIKACINPEAK, AMIKACINTROU, AMIKACIN,  in the last 72 hours   Microbiology: Recent Results (from the past 720 hour(s))  CULTURE, BLOOD (ROUTINE X 2)     Status: None   Collection Time    12/22/13  4:20 PM      Result Value Range Status   Specimen Description BLOOD LEFT ARM   Final   Special Requests BOTTLES DRAWN AEROBIC AND ANAEROBIC 3ML   Final   Culture  Setup Time     Final   Value: 12/22/2013 20:43     Performed at [facility]   Culture     Final   Value: [place] AND CHAINS     Note: Gram Stain Report Called to,Read Back By and Verified With: TIFFANY BANKS ON 12/24/2013 AT 12:10A BY WILEJ     Performed at [facility]   Report Status PENDING   Incomplete  CULTURE, BLOOD (ROUTINE X 2)     Status: None   Collection Time    12/22/13  4:30 PM      Result  Value Range Status   Specimen Description BLOOD LEFT FOREARM   Final   Special Requests BOTTLES DRAWN AEROBIC AND ANAEROBIC 3ML   Final   Culture  Setup Time     Final   Value: 12/22/2013 20:44     Performed at [facility]   Culture     Final   Value: GRAM POSITIVE COCCI IN PAIRS AND CHAINS     Note: Gram Stain Report Called to,Read Back By and Verified With: Darrin Nipper ON 12/23/2013 AT 8:33P BY WILEJ     Performed at [facility]   Report Status PENDING   Incomplete  URINE CULTURE     Status: None   Collection Time    12/22/13  7:04 PM      Result Value Range Status   Specimen Description URINE, CATHETERIZED   Final   Special Requests NONE   Final   Culture  Setup Time     Final   Value: 12/22/2013 21:38     Performed at [place]     Final   Value: NO GROWTH     Performed at [facility]   Culture     Final   Value: NO GROWTH     Performed at [facility]   Report Status 12/23/2013 FINAL   Final  CLOSTRIDIUM  DIFFICILE BY PCR     Status: None   Collection Time    12/23/13 12:19 AM      Result Value Range Status   C difficile by pcr NEGATIVE  NEGATIVE Final   Comment: Performed at [place]     Status: None   Collection Time    12/23/13 12:19 AM      Result Value Range Status   Specimen Description STOOL   Final   Special Requests NONE   Final   Culture     Final   Value: NO SUSPICIOUS COLONIES, CONTINUING TO HOLD     Performed at [facility]   Report Status PENDING   Incomplete  MRSA PCR SCREENING     Status: None   Collection Time    12/23/13  1:20 PM      Result Value Range Status   MRSA by PCR NEGATIVE  NEGATIVE Final   Comment:            The GeneXpert MRSA Assay (FDA     approved for NASAL specimens     only), is one component of a     comprehensive MRSA colonization     surveillance program. It is not     intended to diagnose MRSA     infection nor to guide or      monitor treatment for     MRSA infections.    Anti-infectives: 2/4 >> Vancomycin >> 2/4 >> Zosyn >>  Assessment: 58 y.o. female h/o pancreatic cancer undergoing chemo and radiation, admitted 2/4 with sepsis, source unclear.  Day #3 Vancomycin and Zosyn.  CrCl ~ 44 ml/min.  Increased SCr (0.8 > 1.3) likely reflects sepsis requiring IVF resuscitation yesterday.   Tmax  102.7 (Tc 97.9, no recent APAP)  WBC increased to 15.1  Blood cultures with GPC pairs and chains.  Goal of Therapy:  Vancomycin trough level 15-20 mcg/ml Appropriate abx dosing, eradication of infection.   Plan:   Vancomycin trough level tonight. D/C current vancomycin orders.    Continue Zosyn 3.375gm IV q8h (4hr extended infusions) Follow up renal function & cultures  Gretta Arab PharmD, BCPS Pager [phone] 12/24/2013 12:54 PM

## 2013-12-24 NOTE — Telephone Encounter (Signed)
Pt is currently admitted

## 2013-12-24 NOTE — Progress Notes (Signed)
Brief Pharmacy Note: Vancomycin Trough ordered with concern for increase in SCr from 0.8-> 1.3   2/6 Vancomycin level 18.9 mcg/ml after Vancomycin doses 1g, 750mg , 1g, 1g   Ordered BMET for am 2/7: if SCr reduced would resume Vancomycin at 750mg  q12  Minda Ditto PharmD Pager [phone] 12/24/2013, 8:15 PM

## 2013-12-25 DIAGNOSIS — I369 Nonrheumatic tricuspid valve disorder, unspecified: Secondary | ICD-10-CM

## 2013-12-25 DIAGNOSIS — R0609 Other forms of dyspnea: Secondary | ICD-10-CM

## 2013-12-25 DIAGNOSIS — R0989 Other specified symptoms and signs involving the circulatory and respiratory systems: Secondary | ICD-10-CM

## 2013-12-25 LAB — GLUCOSE, CAPILLARY
GLUCOSE-CAPILLARY: 174 mg/dL — AB (ref 70–99)
GLUCOSE-CAPILLARY: 112 mg/dL — AB (ref 70–99)
GLUCOSE-CAPILLARY: 187 mg/dL — AB (ref 70–99)
GLUCOSE-CAPILLARY: 254 mg/dL — AB (ref 70–99)
Glucose-Capillary: 142 mg/dL — ABNORMAL HIGH (ref 70–99)

## 2013-12-25 LAB — CBC WITH DIFFERENTIAL/PLATELET
Basophils Absolute: 0 10*3/uL (ref 0.0–0.1)
Basophils Relative: 0 % (ref 0–1)
Eosinophils Absolute: 0.2 10*3/uL (ref 0.0–0.7)
Eosinophils Relative: 2 % (ref 0–5)
HCT: 26.6 % — ABNORMAL LOW (ref 36.0–46.0)
Hemoglobin: 8.7 g/dL — ABNORMAL LOW (ref 12.0–15.0)
LYMPHS ABS: 0.6 10*3/uL — AB (ref 0.7–4.0)
LYMPHS PCT: 7 % — AB (ref 12–46)
MCH: 29 pg (ref 26.0–34.0)
MCHC: 32.7 g/dL (ref 30.0–36.0)
MCV: 88.7 fL (ref 78.0–100.0)
Monocytes Absolute: 0.4 10*3/uL (ref 0.1–1.0)
Monocytes Relative: 5 % (ref 3–12)
NEUTROS PCT: 86 % — AB (ref 43–77)
Neutro Abs: 7.1 10*3/uL (ref 1.7–7.7)
PLATELETS: 98 10*3/uL — AB (ref 150–400)
RBC: 3 MIL/uL — AB (ref 3.87–5.11)
RDW: 14.3 % (ref 11.5–15.5)
WBC: 8.3 10*3/uL (ref 4.0–10.5)

## 2013-12-25 LAB — BASIC METABOLIC PANEL
BUN: 20 mg/dL (ref 6–23)
CALCIUM: 8.4 mg/dL (ref 8.4–10.5)
CO2: 18 mEq/L — ABNORMAL LOW (ref 19–32)
Chloride: 109 mEq/L (ref 96–112)
Creatinine, Ser: 1.31 mg/dL — ABNORMAL HIGH (ref 0.50–1.10)
GFR calc Af Amer: 50 mL/min — ABNORMAL LOW (ref >90)
GFR, EST NON AFRICAN AMERICAN: 43 mL/min — AB (ref >90)
Glucose, Bld: 171 mg/dL — ABNORMAL HIGH (ref 70–99)
Potassium: 3.7 mEq/L (ref 3.7–5.3)
SODIUM: 138 meq/L (ref 137–147)

## 2013-12-25 MED ORDER — METOPROLOL TARTRATE 50 MG PO TABS
50.0000 mg | ORAL_TABLET | Freq: Two times a day (BID) | ORAL | Status: DC
  Administered 2013-12-25 – 2014-01-05 (×22): 50 mg via ORAL
  Filled 2013-12-25 (×23): qty 1

## 2013-12-25 MED ORDER — INSULIN ASPART 100 UNIT/ML ~~LOC~~ SOLN
0.0000 [IU] | Freq: Three times a day (TID) | SUBCUTANEOUS | Status: DC
  Administered 2013-12-26: 1 [IU] via SUBCUTANEOUS
  Administered 2013-12-26 (×2): 3 [IU] via SUBCUTANEOUS
  Administered 2013-12-27: 1 [IU] via SUBCUTANEOUS
  Administered 2013-12-27: 3 [IU] via SUBCUTANEOUS
  Administered 2013-12-27: 5 [IU] via SUBCUTANEOUS
  Administered 2013-12-28 (×2): 3 [IU] via SUBCUTANEOUS
  Administered 2013-12-28: 2 [IU] via SUBCUTANEOUS
  Administered 2013-12-29: 1 [IU] via SUBCUTANEOUS
  Administered 2013-12-30: 2 [IU] via SUBCUTANEOUS
  Administered 2013-12-30: 3 [IU] via SUBCUTANEOUS
  Administered 2013-12-31: 2 [IU] via SUBCUTANEOUS
  Administered 2013-12-31 (×2): 3 [IU] via SUBCUTANEOUS
  Administered 2014-01-01: 1 [IU] via SUBCUTANEOUS
  Administered 2014-01-01: 2 [IU] via SUBCUTANEOUS
  Administered 2014-01-02: 1 [IU] via SUBCUTANEOUS
  Administered 2014-01-02: 2 [IU] via SUBCUTANEOUS
  Administered 2014-01-03 (×2): 1 [IU] via SUBCUTANEOUS
  Administered 2014-01-04: 3 [IU] via SUBCUTANEOUS
  Administered 2014-01-04 (×2): 1 [IU] via SUBCUTANEOUS
  Administered 2014-01-05 (×2): 5 [IU] via SUBCUTANEOUS

## 2013-12-25 MED ORDER — ALPRAZOLAM 0.5 MG PO TABS
0.5000 mg | ORAL_TABLET | Freq: Three times a day (TID) | ORAL | Status: DC | PRN
  Administered 2013-12-25 – 2014-01-04 (×5): 0.5 mg via ORAL
  Filled 2013-12-25 (×5): qty 1

## 2013-12-25 MED ORDER — SODIUM CHLORIDE 0.9 % IV SOLN
1000.0000 mL | INTRAVENOUS | Status: DC
  Administered 2013-12-26 – 2014-01-04 (×11): 1000 mL via INTRAVENOUS

## 2013-12-25 MED ORDER — HYDRALAZINE HCL 20 MG/ML IJ SOLN
10.0000 mg | INTRAMUSCULAR | Status: DC | PRN
  Administered 2013-12-25: 10 mg via INTRAVENOUS
  Filled 2013-12-25: qty 0.5
  Filled 2013-12-25: qty 1

## 2013-12-25 MED ORDER — VANCOMYCIN HCL IN DEXTROSE 1-5 GM/200ML-% IV SOLN
1000.0000 mg | INTRAVENOUS | Status: DC
  Administered 2013-12-25 – 2013-12-26 (×2): 1000 mg via INTRAVENOUS
  Filled 2013-12-25 (×2): qty 200

## 2013-12-25 MED ORDER — VANCOMYCIN HCL IN DEXTROSE 750-5 MG/150ML-% IV SOLN
750.0000 mg | Freq: Two times a day (BID) | INTRAVENOUS | Status: DC
  Filled 2013-12-25: qty 150

## 2013-12-25 NOTE — Progress Notes (Signed)
ANTIBIOTIC CONSULT NOTE - FOLLOW UP  Pharmacy Consult for vancomycin Indication: Sepsis  Allergies  Allergen Reactions  . Betadine [Povidone Iodine] Itching and Rash    Patient Measurements: Height: 5\' 3"  (160 cm) (height stated by patient) Weight: 166 lb 14.2 oz (75.7 kg) IBW/kg (Calculated) : 52.4 Adjusted Body Weight:   Vital Signs: Temp: 97.9 F (36.6 C) (02/07 0354) Temp src: Oral (02/07 0354) BP: 147/63 mmHg (02/07 0354) Pulse Rate: 77 (02/07 0354) Intake/Output from previous day: 02/06 0701 - 02/07 0700 In: 3815 [P.O.:240; I.V.:2875; IV Piggyback:700] Out: 1297 [Urine:1296; Stool:1] Intake/Output from this shift: Total I/O In: 1815 [P.O.:240; I.V.:1375; IV Piggyback:200] Out: 450 [Urine:450]  Labs:  Recent Labs  12/23/13 1206 12/24/13 0950 12/25/13 0342  WBC 11.0* 15.1* 8.3  HGB 9.9* 9.4* 8.7*  PLT 127* 101* 98*  CREATININE 0.81 1.31* 1.31*   Estimated Creatinine Clearance: 44.5 ml/min (by C-G formula based on Cr of 1.31).  Recent Labs  12/24/13 1724  [place] 18.9     Microbiology: Recent Results (from the past 720 hour(s))  CULTURE, BLOOD (ROUTINE X 2)     Status: None   Collection Time    12/22/13  4:20 PM      Result Value Range Status   Specimen Description BLOOD LEFT ARM   Final   Special Requests BOTTLES DRAWN AEROBIC AND ANAEROBIC 3ML   Final   Culture  Setup Time     Final   Value: 12/22/2013 20:43     Performed at [facility]   Culture     Final   Value: [place] IN PAIRS AND CHAINS     Note: Gram Stain Report Called to,Read Back By and Verified With: TIFFANY BANKS ON 12/24/2013 AT 12:10A BY WILEJ     Performed at [facility]   Report Status PENDING   Incomplete  CULTURE, BLOOD (ROUTINE X 2)     Status: None   Collection Time    12/22/13  4:30 PM      Result Value Range Status   Specimen Description BLOOD LEFT FOREARM   Final   Special Requests BOTTLES DRAWN AEROBIC AND ANAEROBIC 3ML   Final   Culture  Setup Time     Final   Value: 12/22/2013 20:44     Performed at [facility]   Culture     Final   Value: GRAM POSITIVE COCCI IN PAIRS AND CHAINS     Note: Gram Stain Report Called to,Read Back By and Verified With: Darrin Nipper ON 12/23/2013 AT 8:33P BY WILEJ     Performed at [facility]   Report Status PENDING   Incomplete  URINE CULTURE     Status: None   Collection Time    12/22/13  7:04 PM      Result Value Range Status   Specimen Description URINE, CATHETERIZED   Final   Special Requests NONE   Final   Culture  Setup Time     Final   Value: 12/22/2013 21:38     Performed at [place]     Final   Value: NO GROWTH     Performed at [facility]   Culture     Final   Value: NO GROWTH     Performed at [facility]   Report Status 12/23/2013 FINAL   Final  CLOSTRIDIUM DIFFICILE BY PCR     Status: None   Collection Time  12/23/13 12:19 AM      Result Value Range Status   C difficile by pcr NEGATIVE  NEGATIVE Final   Comment: Performed at [place]     Status: None   Collection Time    12/23/13 12:19 AM      Result Value Range Status   Specimen Description STOOL   Final   Special Requests NONE   Final   Culture     Final   Value: NO SUSPICIOUS COLONIES, CONTINUING TO HOLD     Performed at [facility]   Report Status PENDING   Incomplete  MRSA PCR SCREENING     Status: None   Collection Time    12/23/13  1:20 PM      Result Value Range Status   MRSA by PCR NEGATIVE  NEGATIVE Final   Comment:            The GeneXpert MRSA Assay (FDA     approved for NASAL specimens     only), is one component of a     comprehensive MRSA colonization     surveillance program. It is not     intended to diagnose MRSA     infection nor to guide or     monitor treatment for     MRSA infections.    Anti-infectives   Start     Dose/Rate Route Frequency Ordered Stop   12/25/13 1000   vancomycin (VANCOCIN) IVPB 750 mg/150 ml premix     750 mg 150 mL/hr over 60 Minutes Intravenous Every 12 hours 12/25/13 0613     12/24/13 2315  vancomycin (VANCOCIN) IVPB 750 mg/150 ml premix     750 mg 150 mL/hr over 60 Minutes Intravenous  Once 12/24/13 2301 12/25/13 0011   12/23/13 1800  vancomycin (VANCOCIN) IVPB 1000 mg/200 mL premix  Status:  Discontinued     1,000 mg 200 mL/hr over 60 Minutes Intravenous Every 12 hours 12/23/13 1327 12/24/13 1250   12/23/13 0600  vancomycin (VANCOCIN) IVPB 750 mg/150 ml premix  Status:  Discontinued     750 mg 150 mL/hr over 60 Minutes Intravenous Every 12 hours 12/22/13 1844 12/23/13 1327   12/23/13 0000  piperacillin-tazobactam (ZOSYN) IVPB 3.375 g     3.375 g 12.5 mL/hr over 240 Minutes Intravenous Every 8 hours 12/22/13 1844     12/22/13 1715  vancomycin (VANCOCIN) IVPB 1000 mg/200 mL premix     1,000 mg 200 mL/hr over 60 Minutes Intravenous  Once 12/22/13 1626 12/22/13 1910   12/22/13 1645  piperacillin-tazobactam (ZOSYN) IVPB 3.375 g     3.375 g 100 mL/hr over 30 Minutes Intravenous  Once 12/22/13 1626 12/22/13 1814      Assessment: Patient with increase in SCr.  Vancomycin level <20 on 2/6, SCr is the same this AM.  Will reduce dose for now due to increased SCr  Goal of Therapy:  Vancomycin trough level 15-20 mcg/ml  Plan:  Measure antibiotic drug levels at steady state Follow up culture results Restart vancomycin with 750mg  iv q12hr  Nani Skillern Crowford 12/25/2013,6:14 AM

## 2013-12-25 NOTE — Progress Notes (Signed)
Patient BP delayed transferred to 4th floor. Patient sbp at 1930 was 198. Rechecked at 2030 sbp 168. Prior to transfer patient BP was 155/66. Patient states she feeling fine at that time. Family at bedside and proceeded with transfer patient to 36.

## 2013-12-25 NOTE — Plan of Care (Signed)
Problem: Phase I Progression Outcomes Goal: Initial discharge plan identified Outcome: Progressing Patient transferred to telemetry department - patient progressing towards discharge appropriately

## 2013-12-25 NOTE — Progress Notes (Signed)
ANTIBIOTIC CONSULT NOTE - FOLLOW UP  Pharmacy Consult for Vancomycin and Zosyn Indication: Sepsis  Allergies  Allergen Reactions  . Betadine [Povidone Iodine] Itching and Rash    Patient Measurements: Height: 5\' 3"  (160 cm) (height stated by patient) Weight: 166 lb 14.2 oz (75.7 kg) IBW/kg (Calculated) : 52.4  Vital Signs: Temp: 98.5 F (36.9 C) (02/07 0800) Temp src: Oral (02/07 0800) BP: 147/63 mmHg (02/07 0354) Pulse Rate: 77 (02/07 0354) Intake/Output from previous day: 02/06 0701 - 02/07 0700 In: 3815 [P.O.:240; I.V.:2875; IV Piggyback:700] Out: 1297 [Urine:1296; Stool:1]  Labs:  Recent Labs  12/23/13 1206 12/24/13 0950 12/25/13 0342  WBC 11.0* 15.1* 8.3  HGB 9.9* 9.4* 8.7*  PLT 127* 101* 98*  CREATININE 0.81 1.31* 1.31*   Estimated Creatinine Clearance: 44.5 ml/min (by C-G formula based on Cr of 1.31).     Recent Labs  12/24/13 1724  [place] 18.9     Microbiology: 2/4 blood x2: GPC pairs and chains 2/4 urine: NGF 2/5 MRSA PCR: negative 2/5 stool cx: no susp colonies, cont to hold 2/5 Cdiff: neg  Anti-infectives: 2/4 >> Vancomycin >> 2/4 >> Zosyn >>  Assessment: 58 y.o. female h/o pancreatic cancer undergoing chemo and radiation, admitted 2/4 with sepsis, source unclear.  Day #4 Vancomycin and Zosyn.  CrCl ~ 44 ml/min.  Increased SCr (0.8 > 1.3) yesterday is unchanged today.     WBC improved, 8.3  Blood cultures with GPC pairs and chains.  2/6 Vancomycin trough level is therapeutic at 18.9, but mainly reflects the time when SCr ~ 0.8 and not current SCr 1.3 (2/6-2/7).  Goal of Therapy:  Vancomycin trough level 15-20 mcg/ml Appropriate abx dosing, eradication of infection.   Plan:   Vancomycin 1g IV q24h  Continue Zosyn 3.375gm IV q8h (4hr extended infusions) Follow up renal function & cultures  Gretta Arab PharmD, BCPS Pager [phone] 12/25/2013 8:11 AM

## 2013-12-25 NOTE — Progress Notes (Signed)
  Echocardiogram 2D Echocardiogram has been performed.  [name] [name] 12/25/2013, 11:05 AM

## 2013-12-25 NOTE — Progress Notes (Signed)
Gave report to [facility] [place], RN patient is being trasferred to [id number] [id number]. VS- 155/66, 836hr, 99 ra, 19 rr, afebrile 98.1 .

## 2013-12-25 NOTE — Progress Notes (Signed)
[facility] PROGRESS NOTE  [name] [name] EXH:[id number] DOB: 1956/03/08 DOA: 12/22/2013 PCP: Philis Fendt, MD  Assessment/Plan: 1. Sepsis  1. Gram pos bacteremia 2. CT abd/pelvis without acute findings 3. Pt admits to frequently re-using insulin syringes/needles - source? 4. Pt is s/p biliary stent placement several months ago, doubt source given timing, also stent patent on CT 5. C.Diff neg  6. Pt with peak lactate of over 4.5, resolved with IVF and abx 7. Pt appears to be improving, currently afebrile 8. Will obtain 2D echo to r/o vegetations 2. DM - putting patient on lantus 15 (takes lantus 30 at home) to prevent hypoglycemia, and adding low dose Q4H SSI. Consider increasing insulin coverage when she resumes PO intake when feeling better.  Code Status: Full Family Communication: Pt in room, discussed with daughter over phone. Daughter and pt both in agreement with current plan of care. Disposition Plan: Pending  Antibiotics:  Vancomycin 12/23/13>>>  Zosyn 12/23/13>>>  HPI/Subjective: Reports continuing to feel better. Complains of continued chronic back pain.  Objective: Filed Vitals:   12/24/13 2350 12/25/13 0000 12/25/13 0354 12/25/13 0600  BP: 149/71  147/63   Pulse: 83  77   Temp: 98.6 F (37 C) 98.6 F (37 C) 97.9 F (36.6 C)   TempSrc:  Oral Oral   Resp: 25  24   Height:      Weight:    75.7 kg (166 lb 14.2 oz)  SpO2: 97%  100%     Intake/Output Summary (Last 24 hours) at 12/25/13 0746 Last data filed at 12/25/13 0600  Gross per 24 hour  Intake   3415 ml  Output   1152 ml  Net   2263 ml   Filed Weights   12/22/13 2123 12/24/13 0528 12/25/13 0600  Weight: 70.6 kg (155 lb 10.3 oz) 73.1 kg (161 lb 2.5 oz) 75.7 kg (166 lb 14.2 oz)    Exam:   General:  Awake, in mild discomfort, appears anxious  Cardiovascular: tachycardic, s1, s2, no murmurs  Respiratory: normal resp effort, no wheezing or crackles  Abdomen: soft,  nondistended  Musculoskeletal: perfused, no clubbing   Data Reviewed: Basic Metabolic Panel:  Recent Labs Lab 12/22/13 1630 12/23/13 0350 12/23/13 1206 12/24/13 0950 12/25/13 0342  NA 139 136* 139 139 138  K 3.5* 3.3* 4.2 3.5* 3.7  CL 104 104 105 108 109  CO2 17* 19 17* 18* 18*  GLUCOSE 98 241* 165* 125* 171*  BUN 14 13 12 17 20   CREATININE 0.82 0.83 0.81 1.31* 1.31*  CALCIUM 9.2 8.5 8.9 8.7 8.4   Liver Function Tests:  Recent Labs Lab 12/22/13 1630 12/24/13 0950  AST 62* 90*  ALT 36* 64*  ALKPHOS 202* 176*  BILITOT 0.4 0.7  PROT 8.1 6.2  ALBUMIN 3.6 2.6*   No results found for this basename: LIPASE, AMYLASE,  in the last 168 hours No results found for this basename: AMMONIA,  in the last 168 hours CBC:  Recent Labs Lab 12/22/13 1630 12/23/13 0350 12/23/13 1206 12/24/13 0950 12/25/13 0342  WBC 9.6 11.2* 11.0* 15.1* 8.3  NEUTROABS 9.3*  --  10.3* 13.5* 7.1  HGB 10.0* 8.9* 9.9* 9.4* 8.7*  HCT 31.3* 27.7* 31.0* 28.1* 26.6*  MCV 88.7 89.9 89.9 88.1 88.7  PLT 161 123* 127* 101* 98*   Cardiac Enzymes: No results found for this basename: CKTOTAL, CKMB, CKMBINDEX, TROPONINI,  in the last 168 hours BNP (last 3 results) No results found for this basename: PROBNP,  in the  last 8760 hours CBG:  Recent Labs Lab 12/24/13 1203 12/24/13 1714 12/24/13 2015 12/24/13 2344 12/25/13 0346  GLUCAP 129* 164* 286* 245* 174*    Recent Results (from the past 240 hour(s))  CULTURE, BLOOD (ROUTINE X 2)     Status: None   Collection Time    12/22/13  4:20 PM      Result Value Range Status   Specimen Description BLOOD LEFT ARM   Final   Special Requests BOTTLES DRAWN AEROBIC AND ANAEROBIC 3ML   Final   Culture  Setup Time     Final   Value: 12/22/2013 20:43     Performed at [facility]   Culture     Final   Value: [place] IN PAIRS AND CHAINS     Note: Gram Stain Report Called to,Read Back By and Verified With: TIFFANY BANKS ON 12/24/2013 AT 12:10A  BY WILEJ     Performed at [facility]   Report Status PENDING   Incomplete  CULTURE, BLOOD (ROUTINE X 2)     Status: None   Collection Time    12/22/13  4:30 PM      Result Value Range Status   Specimen Description BLOOD LEFT FOREARM   Final   Special Requests BOTTLES DRAWN AEROBIC AND ANAEROBIC 3ML   Final   Culture  Setup Time     Final   Value: 12/22/2013 20:44     Performed at [facility]   Culture     Final   Value: GRAM POSITIVE COCCI IN PAIRS AND CHAINS     Note: Gram Stain Report Called to,Read Back By and Verified With: Darrin Nipper ON 12/23/2013 AT 8:33P BY WILEJ     Performed at [facility]   Report Status PENDING   Incomplete  URINE CULTURE     Status: None   Collection Time    12/22/13  7:04 PM      Result Value Range Status   Specimen Description URINE, CATHETERIZED   Final   Special Requests NONE   Final   Culture  Setup Time     Final   Value: 12/22/2013 21:38     Performed at [place]     Final   Value: NO GROWTH     Performed at [facility]   Culture     Final   Value: NO GROWTH     Performed at [facility]   Report Status 12/23/2013 FINAL   Final  CLOSTRIDIUM DIFFICILE BY PCR     Status: None   Collection Time    12/23/13 12:19 AM      Result Value Range Status   C difficile by pcr NEGATIVE  NEGATIVE Final   Comment: Performed at [place]     Status: None   Collection Time    12/23/13 12:19 AM      Result Value Range Status   Specimen Description STOOL   Final   Special Requests NONE   Final   Culture     Final   Value: NO SUSPICIOUS COLONIES, CONTINUING TO HOLD     Performed at [facility]   Report Status PENDING   Incomplete  MRSA PCR SCREENING     Status: None   Collection Time    12/23/13  1:20 PM      Result Value Range Status   MRSA by PCR NEGATIVE  NEGATIVE Final   Comment:            The GeneXpert MRSA Assay (FDA     approved  for NASAL specimens     only), is one component of a     comprehensive MRSA colonization     surveillance program. It is not     intended to diagnose MRSA     infection nor to guide or     monitor treatment for     MRSA infections.     Studies: Dg Abd Portable 1v  12/23/2013   CLINICAL DATA:  58 year old female with sepsis, diarrhea. Initial encounter. Pancreatic tumor.  EXAM: PORTABLE ABDOMEN - 1 VIEW  COMPARISON:  CT Abdomen and Pelvis 12/22/2013.  FINDINGS: Portable AP supine view at 1223 hrs. Metallic CBD stent re- identified. Stable, non obstructed bowel gas pattern. Mildly increased retained stool in the distal colon. No acute osseous abnormality identified. No definite pneumoperitoneum on this supine view.  IMPRESSION: 1. Stable, non obstructed bowel gas pattern. 2. Metallic CBD stent, related to pancreatic tumor therapy.   Electronically Signed   By: Lars Pinks M.D.   On: 12/23/2013 12:52    Scheduled Meds: . amLODipine  10 mg Oral Daily  . aspirin  81 mg Oral Daily  . feeding supplement (GLUCERNA SHAKE)  237 mL Oral BID WC  . gabapentin  300 mg Oral TID  . heparin  5,000 Units Subcutaneous Q8H  . insulin aspart  0-9 Units Subcutaneous Q4H  . insulin glargine  15 Units Subcutaneous QHS  . pantoprazole  40 mg Oral BID  . piperacillin-tazobactam (ZOSYN)  IV  3.375 g Intravenous Q8H  . potassium chloride  10 mEq Oral Daily  . simvastatin  10 mg Oral QHS  . sodium chloride  3 mL Intravenous Q12H  . sucralfate  1 g Oral BID  . vancomycin  750 mg Intravenous Q12H   Continuous Infusions: . sodium chloride 1,000 mL (12/25/13 0551)    Principal Problem:   Sepsis Active Problems:   DM (diabetes mellitus)   Abdominal pain, epigastric   Time spent: 51min   [facility], [place] [facility] Pager [phone]. If 7PM-7AM, please contact night-coverage at [email or web address], password [facility] 12/25/2013, 7:46 AM  LOS: 3 days

## 2013-12-26 LAB — CBC WITH DIFFERENTIAL/PLATELET
BASOS ABS: 0 10*3/uL (ref 0.0–0.1)
BASOS PCT: 0 % (ref 0–1)
EOS ABS: 0.1 10*3/uL (ref 0.0–0.7)
Eosinophils Relative: 2 % (ref 0–5)
HCT: 27.4 % — ABNORMAL LOW (ref 36.0–46.0)
Hemoglobin: 9.1 g/dL — ABNORMAL LOW (ref 12.0–15.0)
Lymphocytes Relative: 11 % — ABNORMAL LOW (ref 12–46)
Lymphs Abs: 0.6 10*3/uL — ABNORMAL LOW (ref 0.7–4.0)
MCH: 28.5 pg (ref 26.0–34.0)
MCHC: 33.2 g/dL (ref 30.0–36.0)
MCV: 85.9 fL (ref 78.0–100.0)
MONOS PCT: 10 % (ref 3–12)
Monocytes Absolute: 0.5 10*3/uL (ref 0.1–1.0)
NEUTROS ABS: 3.9 10*3/uL (ref 1.7–7.7)
Neutrophils Relative %: 77 % (ref 43–77)
Platelets: 114 10*3/uL — ABNORMAL LOW (ref 150–400)
RBC: 3.19 MIL/uL — ABNORMAL LOW (ref 3.87–5.11)
RDW: 14.2 % (ref 11.5–15.5)
WBC: 5.1 10*3/uL (ref 4.0–10.5)

## 2013-12-26 LAB — STOOL CULTURE

## 2013-12-26 LAB — BASIC METABOLIC PANEL
BUN: 14 mg/dL (ref 6–23)
CHLORIDE: 106 meq/L (ref 96–112)
CO2: 20 mEq/L (ref 19–32)
Calcium: 8.5 mg/dL (ref 8.4–10.5)
Creatinine, Ser: 1.08 mg/dL (ref 0.50–1.10)
GFR, EST AFRICAN AMERICAN: 63 mL/min — AB (ref >90)
GFR, EST NON AFRICAN AMERICAN: 55 mL/min — AB (ref >90)
Glucose, Bld: 179 mg/dL — ABNORMAL HIGH (ref 70–99)
Potassium: 3.2 mEq/L — ABNORMAL LOW (ref 3.7–5.3)
Sodium: 138 mEq/L (ref 137–147)

## 2013-12-26 LAB — GLUCOSE, CAPILLARY
GLUCOSE-CAPILLARY: 226 mg/dL — AB (ref 70–99)
Glucose-Capillary: 148 mg/dL — ABNORMAL HIGH (ref 70–99)
Glucose-Capillary: 206 mg/dL — ABNORMAL HIGH (ref 70–99)
Glucose-Capillary: 285 mg/dL — ABNORMAL HIGH (ref 70–99)

## 2013-12-26 LAB — RAPID STREP SCREEN (MED CTR MEBANE ONLY): Streptococcus, Group A Screen (Direct): NEGATIVE

## 2013-12-26 MED ORDER — VITAMINS A & D EX OINT
TOPICAL_OINTMENT | CUTANEOUS | Status: AC
  Administered 2013-12-26: 5
  Filled 2013-12-26: qty 5

## 2013-12-26 MED ORDER — SALINE SPRAY 0.65 % NA SOLN
1.0000 | NASAL | Status: DC | PRN
  Administered 2013-12-26 – 2014-01-02 (×2): 1 via NASAL
  Filled 2013-12-26: qty 44

## 2013-12-26 MED ORDER — POTASSIUM CHLORIDE CRYS ER 20 MEQ PO TBCR
40.0000 meq | EXTENDED_RELEASE_TABLET | Freq: Two times a day (BID) | ORAL | Status: AC
  Administered 2013-12-26 (×2): 40 meq via ORAL
  Filled 2013-12-26 (×2): qty 2

## 2013-12-26 NOTE — Progress Notes (Signed)
[facility] PROGRESS NOTE  Ameya Vowell ONG:[id number] DOB: 03/17/1956 DOA: 12/22/2013 PCP: Philis Fendt, MD  Assessment/Plan: 1. Sepsis with strep bacteremia 1. Improving with vanc and zosyn 2. Gram pos bacteremia, thus far growing Strep, awaiting sensitivities 3. CT abd/pelvis without acute findings 4. Pt admits to frequently re-using insulin syringes/needles - source? 5. Pt is s/p biliary stent placement several months ago, doubt source given timing, also stent patent on CT 6. Pt with peak lactate of over 4.5, resolved with IVF and abx 7. Pt appears to be improving, currently afebrile 8. 2D echo without vegetations - no murmurs on exam 9. Pt complains of sore throat that began several days ago associated with a "weird taste" in the mouth 10. Will check rapid strep 11. ?TEE 2. DM - putting patient on lantus 15 (takes lantus 30 at home) to prevent hypoglycemia, and adding low dose Q4H SSI. Consider increasing insulin coverage when she resumes PO intake when feeling better. 3. HTN 1. Was elevated overnight 2. Added metorolol 3. Stable currently 4. Pancreatic cancer with biliary occlusion 1. S/p biliary stent about 1-2 mos ago 2. Patent stent on CT abd 5. DVT prophylaxis 1. Heparin subQ  Code Status: Full Family Communication: Pt in room Disposition Plan: Pending  Antibiotics:  Vancomycin 12/23/13>>>  Zosyn 12/23/13>>>  HPI/Subjective: Reports continuing to feel better. No acute events noted  Objective: Filed Vitals:   12/25/13 2000 12/25/13 2226 12/26/13 0503 12/26/13 0943  BP:  149/76 144/69 139/55  Pulse:  79 73 78  Temp: 98.1 F (36.7 C) 97.9 F (36.6 C) 98.9 F (37.2 C)   TempSrc: Oral  Oral   Resp:  22 20   Height:  5\' 3"  (1.6 m)    Weight:  77.5 kg (170 lb 13.7 oz)    SpO2:  97% 100%     Intake/Output Summary (Last 24 hours) at 12/26/13 0946 Last data filed at 12/26/13 0500  Gross per 24 hour  Intake   2200 ml  Output    750 ml  Net   1450 ml    Filed Weights   12/24/13 0528 12/25/13 0600 12/25/13 2226  Weight: 73.1 kg (161 lb 2.5 oz) 75.7 kg (166 lb 14.2 oz) 77.5 kg (170 lb 13.7 oz)    Exam:   General:  Awake, in mild discomfort, appears anxious  Cardiovascular: tachycardic, s1, s2, no murmurs  Respiratory: normal resp effort, no wheezing or crackles  Abdomen: soft, nondistended  Musculoskeletal: perfused, no clubbing   Data Reviewed: Basic Metabolic Panel:  Recent Labs Lab 12/23/13 0350 12/23/13 1206 12/24/13 0950 12/25/13 0342 12/26/13 0557  NA 136* 139 139 138 138  K 3.3* 4.2 3.5* 3.7 3.2*  CL 104 105 108 109 106  CO2 19 17* 18* 18* 20  GLUCOSE 241* 165* 125* 171* 179*  BUN 13 12 17 20 14   CREATININE 0.83 0.81 1.31* 1.31* 1.08  CALCIUM 8.5 8.9 8.7 8.4 8.5   Liver Function Tests:  Recent Labs Lab 12/22/13 1630 12/24/13 0950  AST 62* 90*  ALT 36* 64*  ALKPHOS 202* 176*  BILITOT 0.4 0.7  PROT 8.1 6.2  ALBUMIN 3.6 2.6*   No results found for this basename: LIPASE, AMYLASE,  in the last 168 hours No results found for this basename: AMMONIA,  in the last 168 hours CBC:  Recent Labs Lab 12/22/13 1630 12/23/13 0350 12/23/13 1206 12/24/13 0950 12/25/13 0342 12/26/13 0557  WBC 9.6 11.2* 11.0* 15.1* 8.3 5.1  NEUTROABS 9.3*  --  10.3* 13.5* 7.1 3.9  HGB 10.0* 8.9* 9.9* 9.4* 8.7* 9.1*  HCT 31.3* 27.7* 31.0* 28.1* 26.6* 27.4*  MCV 88.7 89.9 89.9 88.1 88.7 85.9  PLT 161 123* 127* 101* 98* 114*   Cardiac Enzymes: No results found for this basename: CKTOTAL, CKMB, CKMBINDEX, TROPONINI,  in the last 168 hours BNP (last 3 results) No results found for this basename: PROBNP,  in the last 8760 hours CBG:  Recent Labs Lab 12/25/13 0739 12/25/13 1143 12/25/13 1617 12/25/13 2052 12/26/13 0721  GLUCAP 142* 112* 187* 254* 148*    Recent Results (from the past 240 hour(s))  CULTURE, BLOOD (ROUTINE X 2)     Status: None   Collection Time    12/22/13  4:20 PM      Result Value Range Status    Specimen Description BLOOD LEFT ARM   Final   Special Requests BOTTLES DRAWN AEROBIC AND ANAEROBIC   Final   Culture  Setup Time     Final   Value: 12/22/2013 20:43     Performed at [facility]   Culture     Final   Value: STREPTOCOCCUS SPECIES     Note: Gram Stain Report Called to,Read Back By and Verified With: TIFFANY BANKS ON 12/24/2013 AT 12:10A BY WILEJ     Performed at [facility]   Report Status PENDING   Incomplete  CULTURE, BLOOD (ROUTINE X 2)     Status: None   Collection Time    12/22/13  4:30 PM      Result Value Range Status   Specimen Description BLOOD LEFT FOREARM   Final   Special Requests BOTTLES DRAWN AEROBIC AND ANAEROBIC   Final   Culture  Setup Time     Final   Value: 12/22/2013 20:44     Performed at [facility]   Culture     Final   Value: STREPTOCOCCUS SPECIES     Note: Gram Stain Report Called to,Read Back By and Verified With: Max Sane ON 12/23/2013 AT 8:33P BY WILEJ     Performed at [facility]   Report Status PENDING   Incomplete  URINE CULTURE     Status: None   Collection Time    12/22/13  7:04 PM      Result Value Range Status   Specimen Description URINE, CATHETERIZED   Final   Special Requests NONE   Final   Culture  Setup Time     Final   Value: 12/22/2013 21:38     Performed at [facility]     Final   Value: NO GROWTH     Performed at [facility]   Culture     Final   Value: NO GROWTH     Performed at [facility]   Report Status 12/23/2013 FINAL   Final  CLOSTRIDIUM DIFFICILE BY PCR     Status: None   Collection Time    12/23/13 12:19 AM      Result Value Range Status   C difficile by pcr NEGATIVE  NEGATIVE Final   Comment: Performed at [facility]  STOOL CULTURE     Status: None   Collection Time    12/23/13 12:19 AM      Result Value Range Status   Specimen Description STOOL   Final   Special Requests NONE   Final   Culture      Final  Value: NO SUSPICIOUS COLONIES, CONTINUING TO HOLD     Performed at [facility]   Report Status PENDING   Incomplete  MRSA PCR SCREENING     Status: None   Collection Time    12/23/13  1:20 PM      Result Value Range Status   MRSA by PCR NEGATIVE  NEGATIVE Final   Comment:            The GeneXpert MRSA Assay (FDA     approved for NASAL specimens     only), is one component of a     comprehensive MRSA colonization     surveillance program. It is not     intended to diagnose MRSA     infection nor to guide or     monitor treatment for     MRSA infections.     Studies: No results found.  Scheduled Meds: . amLODipine  10 mg Oral Daily  . aspirin  81 mg Oral Daily  . feeding supplement (GLUCERNA SHAKE)  237 mL Oral BID WC  . gabapentin  300 mg Oral TID  . heparin  5,000 Units Subcutaneous Q8H  . insulin aspart  0-9 Units Subcutaneous TID WC  . insulin glargine  15 Units Subcutaneous QHS  . metoprolol tartrate  50 mg Oral BID  . pantoprazole  40 mg Oral BID  . piperacillin-tazobactam (ZOSYN)  IV  3.375 g Intravenous Q8H  . potassium chloride  10 mEq Oral Daily  . simvastatin  10 mg Oral QHS  . sodium chloride  3 mL Intravenous Q12H  . sucralfate  1 g Oral BID  . vancomycin  1,000 mg Intravenous Q24H   Continuous Infusions: . sodium chloride 1,000 mL (12/26/13 0801)    Principal Problem:   Sepsis Active Problems:   DM (diabetes mellitus)   Abdominal pain, epigastric   Time spent: 40min   Saralee Bolick, [place] [facility] Pager [phone]. If 7PM-7AM, please contact night-coverage at [email or web address], password [facility] 12/26/2013, 9:46 AM  LOS: 4 days

## 2013-12-26 NOTE — Progress Notes (Signed)
Pt is still refusing the Heparin Sq for DVT prevention

## 2013-12-27 ENCOUNTER — Telehealth: Payer: Self-pay | Admitting: *Deleted

## 2013-12-27 ENCOUNTER — Ambulatory Visit: Payer: Medicaid Other | Admitting: Oncology

## 2013-12-27 DIAGNOSIS — E785 Hyperlipidemia, unspecified: Secondary | ICD-10-CM

## 2013-12-27 LAB — CBC
HEMATOCRIT: 25.4 % — AB (ref 36.0–46.0)
HEMOGLOBIN: 8.3 g/dL — AB (ref 12.0–15.0)
MCH: 28.3 pg (ref 26.0–34.0)
MCHC: 32.7 g/dL (ref 30.0–36.0)
MCV: 86.7 fL (ref 78.0–100.0)
Platelets: 115 10*3/uL — ABNORMAL LOW (ref 150–400)
RBC: 2.93 MIL/uL — ABNORMAL LOW (ref 3.87–5.11)
RDW: 14.3 % (ref 11.5–15.5)
WBC: 4.7 10*3/uL (ref 4.0–10.5)

## 2013-12-27 LAB — BASIC METABOLIC PANEL
BUN: 11 mg/dL (ref 6–23)
CALCIUM: 8.2 mg/dL — AB (ref 8.4–10.5)
CO2: 20 meq/L (ref 19–32)
Chloride: 104 mEq/L (ref 96–112)
Creatinine, Ser: 1.03 mg/dL (ref 0.50–1.10)
GFR calc Af Amer: 67 mL/min — ABNORMAL LOW (ref >90)
GFR, EST NON AFRICAN AMERICAN: 58 mL/min — AB (ref >90)
GLUCOSE: 263 mg/dL — AB (ref 70–99)
POTASSIUM: 3.6 meq/L — AB (ref 3.7–5.3)
Sodium: 136 mEq/L — ABNORMAL LOW (ref 137–147)

## 2013-12-27 LAB — GLUCOSE, CAPILLARY
Glucose-Capillary: 143 mg/dL — ABNORMAL HIGH (ref 70–99)
Glucose-Capillary: 279 mg/dL — ABNORMAL HIGH (ref 70–99)
Glucose-Capillary: 206 mg/dL — ABNORMAL HIGH (ref 70–99)
Glucose-Capillary: 249 mg/dL — ABNORMAL HIGH (ref 70–99)

## 2013-12-27 MED ORDER — DEXTROSE 5 % IV SOLN
2.0000 g | INTRAVENOUS | Status: DC
  Administered 2013-12-27 – 2014-01-05 (×10): 2 g via INTRAVENOUS
  Filled 2013-12-27 (×10): qty 2

## 2013-12-27 NOTE — Care Management Note (Addendum)
    Page 1 of 2   01/05/2014     11:07:21 AM   CARE MANAGEMENT NOTE 01/05/2014  Patient:  [name] [name], [name] [name]   Account Number:  [id number]  Date Initiated:  12/24/2013  Documentation initiated by:  DAVIS,RHONDA  Subjective/Objective Assessment:   pt with temp 102.4, hypotensive, sepsis, recent hx of biliary stent placement for pancreatic ca.     Action/Plan:   home when stable/lives at home with support for her children.   Anticipated DC Date:  01/05/2014   Anticipated DC Plan:  [place]  In-house referral  NA      DC Planning Services  CM consult      PAC Choice  NA   Choice offered to / List presented to:  C-1 Patient   DME arranged  NA      DME agency  NA     [facility] arranged  HH-1 RN  IV Antibiotics  HH-4 NURSE'S [place].   Status of service:  Completed, signed off Medicare Important Message given?  NA - LOS <3 / Initial given by admissions (If response is "NO", the following Medicare IM given date fields will be blank) Date Medicare IM given:   Date Additional Medicare IM given:    Discharge Disposition:  [place]  Per UR Regulation:  Reviewed for med. necessity/level of care/duration of stay  If discussed at [place] of Stay Meetings, dates discussed:   12/30/2013  01/04/2014    Comments:  01/05/14 Taran Hable RN,BSN NCM [phone] FOR D/C TODAY HOME W/HHRN-IV ABX,[facility] IV NURSE WILL COME TO DO FINAL INSTRUCTION WITH PATIENT.HH NURSE'S AIDE,& HH SOCIAL WORKER ALSO.NO FURTHER D/C NEEDS.  01/04/14 Leniya Breit RN,BSN NCM [phone] POST U/S RESULTS NOTED.FOR CONTINUED MEDICAL TREATMENT.[place] NOTIFIED.  CT ABD RESULTS NOTED, FOR U/S ABD,AWAIT FINDINGS.PATIENT, DTRS,[facility] IV RN REP, & CASE MGR WERE IN RM TOGETHER WHILE HOME IV ABX INSTRUCTION GIVEN.DTRS VOICED UNDERSTANDING.AWAITING FOR HHRN-IV ABX,DRUG W/FREQ/DURATION ORDER,& PICC LINE FLUSH CARE ORDERS.AWAIT FINAL  HHRN/AIDE,SW ORDER.  01/03/14 Abygale Karpf RN,BSN NCM [phone] PATIENT NOW AGREEABLE TO [facility] HHRN-LONG TERM IV ABX.SPOKE TO PATIENT [place] REP KRISTEN SO THAT PATIENT UNDERSTOOD ALL INFORMATION ABOUT HOME IV ABX,& HER COMMITTMENT TO LEARN.PATIENT VOICED UNDERSTANDING.MD ALSO MET WITH PATIENT WITH CASE MGR TO CONFIRM PATIENT'S UNDERSTANDING OF ALL TREATMENT PLANS.PATIENT VOICED UNDERSTANDING.[facility] AWAITING FINAL HH RN IV ABX ORDERS.  12/31/13 Monterrio Gerst RN,BSN NCM [phone] PER [facility] KRISTEN PATIENT DOES NOT WANT TO DO ANY HOME IV ABX.WOULD RECOMMEND ORAL ABX.PICC D/C.PATIENT WANTS A HOSPITAL BED,MATTRESS,& A CANE.EXPLAINED THAT THESE ITEMS WOULD BE ORDERED BY MD IF MD FELT THERE WAS A MEDICAL NEED.PATIENT VOICED UNDERSTANDING.WOULD RECOMMEND PT CONS.MD NOTIFIED.  12/30/13 Jowan Skillin RN,BSN NCM [phone] PICC RUE IN PLACE.RECEIVED HHRN ORDERS FOR LONG TERM IV ABX.[facility] CHOSEN.TC [facility] REP KRISTEN AWARE OF HHRN-IV ABX ORDER.EXPLAINED THAT PATIENT VERY ANXIOUS ABOUT LEARNING ABOUT HOME IV ABX.PATIENT HAS DTR WHO WILL ALSO LEARN.  12/27/13 Jahree Dermody RN,BSN NCM [phone] TRANSFER FROM SDU.PATIENT ASKING FOR NURSE'S AIDE,& CANE.EXPLAINED THAT ATTENDING WOULD EVALUATE & MAKE RECOMMENDATIONS,THEN ORDER REQUIRED SERVICES IF MEDICALLY NEEDED.PATIENT VOICED UNDERSTANDING.  [id number] Rosana Hoes, RN, BSN, [place] [phone] Chart Reviewed for discharge and hospital needs. Discharge needs at time of review:  None present will follow for needs. Review of patient progress due on [id number].

## 2013-12-27 NOTE — Telephone Encounter (Signed)
Daughter called to cancel her appointment today-is in hospital [facility]room [phone]. Dr. Benay Spice notified.

## 2013-12-27 NOTE — Progress Notes (Signed)
[facility] PROGRESS NOTE  [name] [name] BHA:[id number] DOB: 1956-03-10 DOA: 12/22/2013 PCP: Philis Fendt, MD  Assessment/Plan: 1. Sepsis with strep bacteremia 1. Improving with vanc and zosyn 2. Gram pos bacteremia, thus far growing Strep, awaiting sensitivities 3. CT abd/pelvis without acute findings 4. Pt admits to frequently re-using insulin syringes/needles - source? 5. Pt is s/p biliary stent placement several months ago, doubt source given timing, also stent patent on CT 6. Pt with peak lactate of over 4.5, resolved with IVF and abx 7. Pt appears to be improving, currently afebrile 8. 2D echo without vegetations - no murmurs on exam 9. Pt complained of sore throat, rapid strep neg 10. Discussed with ID - recs for repeat blood culture and if pos, then would pursue with TEE 2. DM - putting patient on lantus 15 (takes lantus 30 at home) to prevent hypoglycemia, and adding low dose Q4H SSI. Consider increasing insulin coverage when she resumes PO intake when feeling better. 3. HTN 1. Was elevated overnight 2. Added metorolol 3. Stable currently 4. Pancreatic cancer with biliary occlusion 1. S/p biliary stent about 1-2 mos ago 2. Patent stent on CT abd 5. DVT prophylaxis 1. Heparin subQ  Code Status: Full Family Communication: Pt in room Disposition Plan: Pending  Antibiotics:  Vancomycin 12/23/13>>>2/9  Zosyn 12/23/13>>>2/9  HPI/Subjective: Reports continuing to feel better. No acute events noted  Objective: Filed Vitals:   12/26/13 1334 12/26/13 2053 12/27/13 0557 12/27/13 0940  BP: 150/80 166/82 133/58 162/81  Pulse: 71 80 69 76  Temp: 99.1 F (37.3 C) 98.6 F (37 C) 98.3 F (36.8 C)   TempSrc: Oral Oral Oral   Resp: 20 20 22    Height:      Weight:      SpO2: 95% 94% 97%     Intake/Output Summary (Last 24 hours) at 12/27/13 1048 Last data filed at 12/27/13 0700  Gross per 24 hour  Intake 2020.83 ml  Output      0 ml  Net 2020.83 ml   Filed Weights    12/24/13 0528 12/25/13 0600 12/25/13 2226  Weight: 73.1 kg (161 lb 2.5 oz) 75.7 kg (166 lb 14.2 oz) 77.5 kg (170 lb 13.7 oz)    Exam:   General:  Awake, in mild discomfort, appears anxious  Cardiovascular: tachycardic, s1, s2, no murmurs  Respiratory: normal resp effort, no wheezing or crackles  Abdomen: soft, nondistended  Musculoskeletal: perfused, no clubbing   Data Reviewed: Basic Metabolic Panel:  Recent Labs Lab 12/23/13 1206 12/24/13 0950 12/25/13 0342 12/26/13 0557 12/27/13 0350  NA 139 139 138 138 136*  K 4.2 3.5* 3.7 3.2* 3.6*  CL 105 108 109 106 104  CO2 17* 18* 18* 20 20  GLUCOSE 165* 125* 171* 179* 263*  BUN 12 17 20 14 11   CREATININE 0.81 1.31* 1.31* 1.08 1.03  CALCIUM 8.9 8.7 8.4 8.5 8.2*   Liver Function Tests:  Recent Labs Lab 12/22/13 1630 12/24/13 0950  AST 62* 90*  ALT 36* 64*  ALKPHOS 202* 176*  BILITOT 0.4 0.7  PROT 8.1 6.2  ALBUMIN 3.6 2.6*   No results found for this basename: LIPASE, AMYLASE,  in the last 168 hours No results found for this basename: AMMONIA,  in the last 168 hours CBC:  Recent Labs Lab 12/22/13 1630  12/23/13 1206 12/24/13 0950 12/25/13 0342 12/26/13 0557 12/27/13 0350  WBC 9.6  < > 11.0* 15.1* 8.3 5.1 4.7  NEUTROABS 9.3*  --  10.3* 13.5* 7.1 3.9  --  HGB 10.0*  < > 9.9* 9.4* 8.7* 9.1* 8.3*  HCT 31.3*  < > 31.0* 28.1* 26.6* 27.4* 25.4*  MCV 88.7  < > 89.9 88.1 88.7 85.9 86.7  PLT 161  < > 127* 101* 98* 114* 115*  < > = values in this interval not displayed. Cardiac Enzymes: No results found for this basename: CKTOTAL, CKMB, CKMBINDEX, TROPONINI,  in the last 168 hours BNP (last 3 results) No results found for this basename: PROBNP,  in the last 8760 hours CBG:  Recent Labs Lab 12/26/13 0721 12/26/13 1133 12/26/13 1719 12/26/13 2050 12/27/13 0722  GLUCAP 148* 226* 206* 285* 143*    Recent Results (from the past 240 hour(s))  CULTURE, BLOOD (ROUTINE X 2)     Status: None   Collection  Time    12/22/13  4:20 PM      Result Value Range Status   Specimen Description BLOOD LEFT ARM   Final   Special Requests BOTTLES DRAWN AEROBIC AND ANAEROBIC 3ML   Final   Culture  Setup Time     Final   Value: 12/22/2013 20:43     Performed at [facility]   Culture     Final   Value: STREPTOCOCCUS SPECIES     Note: Gram Stain Report Called to,Read Back By and Verified With: TIFFANY BANKS ON 12/24/2013 AT 12:10A BY WILEJ     Performed at [facility]   Report Status PENDING   Incomplete  CULTURE, BLOOD (ROUTINE X 2)     Status: None   Collection Time    12/22/13  4:30 PM      Result Value Range Status   Specimen Description BLOOD LEFT FOREARM   Final   Special Requests BOTTLES DRAWN AEROBIC AND ANAEROBIC 3ML   Final   Culture  Setup Time     Final   Value: 12/22/2013 20:44     Performed at [facility]   Culture     Final   Value: STREPTOCOCCUS SPECIES     Note: Gram Stain Report Called to,Read Back By and Verified With: Darrin Nipper ON 12/23/2013 AT 8:33P BY WILEJ     Performed at [facility]   Report Status PENDING   Incomplete  URINE CULTURE     Status: None   Collection Time    12/22/13  7:04 PM      Result Value Range Status   Specimen Description URINE, CATHETERIZED   Final   Special Requests NONE   Final   Culture  Setup Time     Final   Value: 12/22/2013 21:38     Performed at [place]     Final   Value: NO GROWTH     Performed at [facility]   Culture     Final   Value: NO GROWTH     Performed at [facility]   Report Status 12/23/2013 FINAL   Final  CLOSTRIDIUM DIFFICILE BY PCR     Status: None   Collection Time    12/23/13 12:19 AM      Result Value Range Status   C difficile by pcr NEGATIVE  NEGATIVE Final   Comment: Performed at [facility]     Status: None   Collection Time    12/23/13 12:19 AM      Result Value Range Status   Specimen Description  STOOL   Final   Special  Requests NONE   Final   Culture     Final   Value: NO SALMONELLA, SHIGELLA, CAMPYLOBACTER, YERSINIA, OR E.COLI 0157:H7 ISOLATED     Performed at [facility]   Report Status 12/26/2013 FINAL   Final  MRSA PCR SCREENING     Status: None   Collection Time    12/23/13  1:20 PM      Result Value Range Status   MRSA by PCR NEGATIVE  NEGATIVE Final   Comment:            The GeneXpert MRSA Assay (FDA     approved for NASAL specimens     only), is one component of a     comprehensive MRSA colonization     surveillance program. It is not     intended to diagnose MRSA     infection nor to guide or     monitor treatment for     MRSA infections.  RAPID STREP SCREEN     Status: None   Collection Time    12/26/13 11:22 AM      Result Value Range Status   Streptococcus, Group A Screen (Direct) NEGATIVE  NEGATIVE Final   Comment: (NOTE)     A Rapid Antigen test may result negative if the antigen level in the     sample is below the detection level of this test. The FDA has not     cleared this test as a stand-alone test therefore the rapid antigen     negative result has reflexed to a Group A Strep culture.     Studies: No results found.  Scheduled Meds: . amLODipine  10 mg Oral Daily  . aspirin  81 mg Oral Daily  . feeding supplement (GLUCERNA SHAKE)  237 mL Oral BID WC  . gabapentin  300 mg Oral TID  . heparin  5,000 Units Subcutaneous Q8H  . insulin aspart  0-9 Units Subcutaneous TID WC  . insulin glargine  15 Units Subcutaneous QHS  . metoprolol tartrate  50 mg Oral BID  . pantoprazole  40 mg Oral BID  . potassium chloride  10 mEq Oral Daily  . simvastatin  10 mg Oral QHS  . sodium chloride  3 mL Intravenous Q12H  . sucralfate  1 g Oral BID   Continuous Infusions: . sodium chloride 1,000 mL (12/27/13 0847)    Principal Problem:   Sepsis Active Problems:   DM (diabetes mellitus)   HTN (hypertension)   Biliary stricture   Pancreatic  cancer   Abdominal pain, epigastric   Time spent: 42min   Aida Lemaire, [facility] Pager [phone]. If 7PM-7AM, please contact night-coverage at [email or web address], password [facility] 12/27/2013, 10:48 AM  LOS: 5 days

## 2013-12-27 NOTE — Progress Notes (Signed)
ANTIBIOTIC CONSULT NOTE - FOLLOW UP  Pharmacy Consult for Ceftriaxone Indication: Streptococcus Sepsis  Allergies  Allergen Reactions  . Betadine [Povidone Iodine] Itching and Rash    Patient Measurements: Height: 5\' 3"  (160 cm) Weight: 170 lb 13.7 oz (77.5 kg) IBW/kg (Calculated) : 52.4  Vital Signs: Temp: 98.3 F (36.8 C) (02/09 0557) Temp src: Oral (02/09 0557) BP: 162/81 mmHg (02/09 0940) Pulse Rate: 76 (02/09 0940) Intake/Output from previous day: 02/08 0701 - 02/09 0700 In: 2070.8 [P.O.:560; I.V.:1160.8; IV Piggyback:350] Out: -   Labs:  Recent Labs  12/25/13 0342 12/26/13 0557 12/27/13 0350  WBC 8.3 5.1 4.7  HGB 8.7* 9.1* 8.3*  PLT 98* 114* 115*  CREATININE 1.31* 1.08 1.03   Estimated Creatinine Clearance: 57.2 ml/min (by C-G formula based on Cr of 1.03).     Recent Labs  12/24/13 1724  Center 18.9     Microbiology: 2/4 blood x2: Streptococcus species 2/4 urine: NGF 2/5 MRSA PCR: negative 2/5 stool cx: NGF 2/5 Cdiff: neg 2/8 Rapid strep screen: neg 2/8 Group A strep cx:  2/9 blood x2 (repeat):  Anti-infectives: 2/4 >> Vancomycin >> 2/9 2/4 >> Zosyn >> 2/9 2/9 >> Ceftriaxone >>  Assessment: 58 y.o. female h/o pancreatic cancer undergoing chemo and radiation, admitted 2/4 with sepsis, source unclear.  Day #6 Vancomycin and Zosyn.  AKI improved, SCr down 1.03 today, CrCl 57 ml/min  WBC improved, 4.7  Strep bacteremia (2/4), repeat blood cultures pending  TTE without vegetations, plan TEE if repeat blood cx positive  Per ID recommendations, narrow abx to ceftriaxone today  Goal of Therapy:  Appropriate abx dosing, eradication of infection.   Plan:  Ceftriaxone 2g IV q24h - no further adjustments anticipated F/u repeat blood cultures, duration of therapy  Peggyann Juba, PharmD, BCPS Pager: [phone]  12/27/2013 10:51 AM

## 2013-12-27 NOTE — Progress Notes (Signed)
Inpatient Diabetes Program Recommendations  AACE/ADA: New Consensus Statement on Inpatient Glycemic Control (2013)  Target Ranges:  Prepandial:   less than 140 mg/dL      Peak postprandial:   less than 180 mg/dL (1-2 hours)      Critically ill patients:  140 - 180 mg/dL   Reason for Assessment: Hyperglycemia  Diabetes history: Insulin requiring diabetes Outpatient Diabetes medications: Lantus 30 units at HS and Novolog 10 units tid with meals Current orders for Inpatient glycemic control: Lantus 15 units at HS and Novolog sensitive correction tid  Results for KAHLEE, METIVIER (MRN [id number]) as of 12/27/2013 13:55  Ref. Range 12/26/2013 07:21 12/26/2013 11:33 12/26/2013 17:19 12/26/2013 20:50 12/27/2013 07:22 12/27/2013 12:09  Glucose-Capillary Latest Range: 70-99 mg/dL 148 (H) 226 (H) 206 (H) 285 (H) 143 (H) 279 (H)   Note:  CBG pattern implies that patient would benefit from addition of some meal coverage novolog.  On half of home Lantus dose.  Request MD consider resuming about half of home meal coverage, (about 3 or 4 units of Novolog tid with meals) to be given in addition to correction scale provided patient eats at least 50% and CBG is at least 80 mg/dl.  Thank you.  Aneira Cavitt S. Marcelline Mates, RN, CNS, CDE Inpatient Diabetes Program, team pager [phone]

## 2013-12-28 LAB — CULTURE, BLOOD (ROUTINE X 2)

## 2013-12-28 LAB — GLUCOSE, CAPILLARY
GLUCOSE-CAPILLARY: 190 mg/dL — AB (ref 70–99)
GLUCOSE-CAPILLARY: 195 mg/dL — AB (ref 70–99)
Glucose-Capillary: 203 mg/dL — ABNORMAL HIGH (ref 70–99)
Glucose-Capillary: 208 mg/dL — ABNORMAL HIGH (ref 70–99)

## 2013-12-28 LAB — CULTURE, GROUP A STREP

## 2013-12-28 MED ORDER — INSULIN ASPART 100 UNIT/ML ~~LOC~~ SOLN
5.0000 [IU] | Freq: Three times a day (TID) | SUBCUTANEOUS | Status: DC
Start: 2013-12-28 — End: 2014-01-05
  Administered 2013-12-28 – 2014-01-05 (×16): 5 [IU] via SUBCUTANEOUS

## 2013-12-28 MED ORDER — INSULIN GLARGINE 100 UNIT/ML ~~LOC~~ SOLN
20.0000 [IU] | Freq: Every day | SUBCUTANEOUS | Status: DC
  Administered 2013-12-28 – 2014-01-04 (×8): 20 [IU] via SUBCUTANEOUS
  Filled 2013-12-28 (×10): qty 0.2

## 2013-12-28 NOTE — Progress Notes (Addendum)
[facility] PROGRESS NOTE  [name] [name] [id number] DOB: 1956-05-10 DOA: 12/22/2013 PCP: Philis Fendt, MD  Off Service Summary [phone] with a hx of diabetes and pancreatic CA s/p biliary stent about 1-2 mos ago presents with Strep bacteremia with sepsis/shock. Thus far CT abd without source of acute infection, 2D echo w/o veg. Cardiology consulted for TEE.  Assessment/Plan: 1. Sepsis with strep bacteremia 1. Improving with vanc and zosyn 2. Gram pos bacteremia, thus far growing Strep, awaiting sensitivities 3. CT abd/pelvis without acute findings 4. Pt admits to frequently re-using insulin syringes/needles - source? 5. Pt is s/p biliary stent placement several months ago, doubt source given timing, also stent patent on CT 6. Pt with peak lactate of over 4.5, resolved with IVF and abx 7. Pt appears to be improving, currently afebrile 8. 2D echo without vegetations - no murmurs on exam 9. Pt complained of sore throat, rapid strep neg 10. Discussed with ID - recs for repeat blood culture pending 11. Will consult Cardiology for TEE to r/o endocarditis 2. DM - putting patient on lantus 15 (takes lantus 30 at home) to prevent hypoglycemia, and adding low dose Q4H SSI. Consider increasing insulin coverage when she resumes PO intake when feeling better. 3. HTN 1. Was elevated overnight 2. Added metorolol 3. Stable currently 4. Pancreatic cancer with biliary occlusion 1. S/p biliary stent about 1-2 mos ago 2. Patent stent on CT abd 5. DVT prophylaxis 1. Heparin subQ 6. LE swelling 1. Suspect venous stasis vs secondary to amlodipine 2. Elevate LE for now  Code Status: Full Family Communication: Pt in room Disposition Plan: Pending  Antibiotics:  Vancomycin 12/23/13>>>2/9  Zosyn 12/23/13>>>2/9  HPI/Subjective: Reports feeling well.  Objective: Filed Vitals:   12/27/13 2306 12/28/13 0524 12/28/13 1012 12/28/13 1013  BP:  143/71 145/76   Pulse:  68  66  Temp: 99.2 F (37.3  C) 98.4 F (36.9 C)    TempSrc: Oral Oral    Resp:  24    Height:      Weight:      SpO2:  94%      Intake/Output Summary (Last 24 hours) at 12/28/13 1020 Last data filed at 12/28/13 1019  Gross per 24 hour  Intake 2185.83 ml  Output    300 ml  Net 1885.83 ml   Filed Weights   12/24/13 0528 12/25/13 0600 12/25/13 2226  Weight: 73.1 kg (161 lb 2.5 oz) 75.7 kg (166 lb 14.2 oz) 77.5 kg (170 lb 13.7 oz)    Exam:   General:  Awake, in mild discomfort, appears anxious  Cardiovascular: tachycardic, s1, s2, no murmurs  Respiratory: normal resp effort, no wheezing or crackles  Abdomen: soft, nondistended  Musculoskeletal: perfused, no clubbing   Data Reviewed: Basic Metabolic Panel:  Recent Labs Lab 12/23/13 1206 12/24/13 0950 12/25/13 0342 12/26/13 0557 12/27/13 0350  NA 139 139 138 138 136*  K 4.2 3.5* 3.7 3.2* 3.6*  CL 105 108 109 106 104  CO2 17* 18* 18* 20 20  GLUCOSE 165* 125* 171* 179* 263*  BUN 12 17 20 14 11   CREATININE 0.81 1.31* 1.31* 1.08 1.03  CALCIUM 8.9 8.7 8.4 8.5 8.2*   Liver Function Tests:  Recent Labs Lab 12/22/13 1630 12/24/13 0950  AST 62* 90*  ALT 36* 64*  ALKPHOS 202* 176*  BILITOT 0.4 0.7  PROT 8.1 6.2  ALBUMIN 3.6 2.6*   No results found for this basename: LIPASE, AMYLASE,  in the last 168 hours No results found  for this basename: AMMONIA,  in the last 168 hours CBC:  Recent Labs Lab 12/22/13 1630  12/23/13 1206 12/24/13 0950 12/25/13 0342 12/26/13 0557 12/27/13 0350  WBC 9.6  < > 11.0* 15.1* 8.3 5.1 4.7  NEUTROABS 9.3*  --  10.3* 13.5* 7.1 3.9  --   HGB 10.0*  < > 9.9* 9.4* 8.7* 9.1* 8.3*  HCT 31.3*  < > 31.0* 28.1* 26.6* 27.4* 25.4*  MCV 88.7  < > 89.9 88.1 88.7 85.9 86.7  PLT 161  < > 127* 101* 98* 114* 115*  < > = values in this interval not displayed. Cardiac Enzymes: No results found for this basename: CKTOTAL, CKMB, CKMBINDEX, TROPONINI,  in the last 168 hours BNP (last 3 results) No results found for  this basename: PROBNP,  in the last 8760 hours CBG:  Recent Labs Lab 12/27/13 0722 12/27/13 1209 12/27/13 1701 12/27/13 2153 12/28/13 0737  GLUCAP 143* 279* 206* 249* 190*    Recent Results (from the past 240 hour(s))  CULTURE, BLOOD (ROUTINE X 2)     Status: None   Collection Time    12/22/13  4:20 PM      Result Value Range Status   Specimen Description BLOOD LEFT ARM   Final   Special Requests BOTTLES DRAWN AEROBIC AND ANAEROBIC 3ML   Final   Culture  Setup Time     Final   Value: 12/22/2013 20:43     Performed at [facility]   Culture     Final   Value: STREPTOCOCCUS SPECIES     Note: Gram Stain Report Called to,Read Back By and Verified With: TIFFANY BANKS ON 12/24/2013 AT 12:10A BY WILEJ     Performed at [facility]   Report Status PENDING   Incomplete  CULTURE, BLOOD (ROUTINE X 2)     Status: None   Collection Time    12/22/13  4:30 PM      Result Value Range Status   Specimen Description BLOOD LEFT FOREARM   Final   Special Requests BOTTLES DRAWN AEROBIC AND ANAEROBIC 3ML   Final   Culture  Setup Time     Final   Value: 12/22/2013 20:44     Performed at [facility]   Culture     Final   Value: STREPTOCOCCUS SPECIES     Note: Gram Stain Report Called to,Read Back By and Verified With: Darrin Nipper ON 12/23/2013 AT 8:33P BY WILEJ     Performed at [facility]   Report Status PENDING   Incomplete  URINE CULTURE     Status: None   Collection Time    12/22/13  7:04 PM      Result Value Range Status   Specimen Description URINE, CATHETERIZED   Final   Special Requests NONE   Final   Culture  Setup Time     Final   Value: 12/22/2013 21:38     Performed at [place]     Final   Value: NO GROWTH     Performed at [facility]   Culture     Final   Value: NO GROWTH     Performed at [facility]   Report Status 12/23/2013 FINAL   Final  CLOSTRIDIUM DIFFICILE BY PCR     Status: None    Collection Time    12/23/13 12:19 AM      Result Value Range Status   C difficile  by pcr NEGATIVE  NEGATIVE Final   Comment: Performed at [place]     Status: None   Collection Time    12/23/13 12:19 AM      Result Value Range Status   Specimen Description STOOL   Final   Special Requests NONE   Final   Culture     Final   Value: NO SALMONELLA, SHIGELLA, CAMPYLOBACTER, YERSINIA, OR E.COLI 0157:H7 ISOLATED     Performed at [facility]   Report Status 12/26/2013 FINAL   Final  MRSA PCR SCREENING     Status: None   Collection Time    12/23/13  1:20 PM      Result Value Range Status   MRSA by PCR NEGATIVE  NEGATIVE Final   Comment:            The GeneXpert MRSA Assay (FDA     approved for NASAL specimens     only), is one component of a     comprehensive MRSA colonization     surveillance program. It is not     intended to diagnose MRSA     infection nor to guide or     monitor treatment for     MRSA infections.  RAPID STREP SCREEN     Status: None   Collection Time    12/26/13 11:22 AM      Result Value Range Status   Streptococcus, Group A Screen (Direct) NEGATIVE  NEGATIVE Final   Comment: (NOTE)     A Rapid Antigen test may result negative if the antigen level in the     sample is below the detection level of this test. The FDA has not     cleared this test as a stand-alone test therefore the rapid antigen     negative result has reflexed to a Group A Strep culture.  CULTURE, GROUP A STREP     Status: None   Collection Time    12/26/13 11:22 AM      Result Value Range Status   Specimen Description THROAT   Final   Special Requests NONE   Final   Culture     Final   Value: Culture reincubated for better growth     Performed at [facility]   Report Status PENDING   Incomplete  CULTURE, BLOOD (ROUTINE X 2)     Status: None   Collection Time    12/27/13 11:20 AM      Result Value Range Status   Specimen Description BLOOD  RIGHT ARM   Final   Special Requests BOTTLES DRAWN AEROBIC AND ANAEROBIC 10 CC   Final   Culture  Setup Time     Final   Value: 12/27/2013 14:30     Performed at [facility]   Culture     Final   Value:        BLOOD CULTURE RECEIVED NO GROWTH TO DATE CULTURE WILL BE HELD FOR 5 DAYS BEFORE ISSUING A FINAL NEGATIVE REPORT     Performed at [facility]   Report Status PENDING   Incomplete  CULTURE, BLOOD (ROUTINE X 2)     Status: None   Collection Time    12/27/13 11:28 AM      Result Value Range Status   Specimen Description BLOOD RIGHT HAND   Final   Special Requests BOTTLES DRAWN AEROBIC AND ANAEROBIC 5 CC EA   Final   Culture  Setup Time     Final   Value: 12/27/2013 14:30     Performed at [facility]   Culture     Final   Value:        BLOOD CULTURE RECEIVED NO GROWTH TO DATE CULTURE WILL BE HELD FOR 5 DAYS BEFORE ISSUING A FINAL NEGATIVE REPORT     Performed at [facility]   Report Status PENDING   Incomplete     Studies: No results found.  Scheduled Meds: . amLODipine  10 mg Oral Daily  . aspirin  81 mg Oral Daily  . cefTRIAXone (ROCEPHIN)  IV  2 g Intravenous Q24H  . feeding supplement (GLUCERNA SHAKE)  237 mL Oral BID WC  . gabapentin  300 mg Oral TID  . heparin  5,000 Units Subcutaneous Q8H  . insulin aspart  0-9 Units Subcutaneous TID WC  . insulin glargine  15 Units Subcutaneous QHS  . metoprolol tartrate  50 mg Oral BID  . pantoprazole  40 mg Oral BID  . potassium chloride  10 mEq Oral Daily  . simvastatin  10 mg Oral QHS  . sodium chloride  3 mL Intravenous Q12H  . sucralfate  1 g Oral BID   Continuous Infusions: . sodium chloride 1,000 mL (12/28/13 0340)    Principal Problem:   Sepsis Active Problems:   DM (diabetes mellitus)   HTN (hypertension)   Biliary stricture   Pancreatic cancer   Abdominal pain, epigastric   Time spent: 30min   [facility], [facility] Pager [phone]. If 7PM-7AM, please  contact night-coverage at [email or web address], password [facility] 12/28/2013, 10:20 AM  LOS: 6 days

## 2013-12-29 ENCOUNTER — Encounter (HOSPITAL_COMMUNITY)
Admission: EM | Disposition: A | Discharge status: Home Health | Payer: Medicaid Other | Source: Home / Self Care | Attending: Internal Medicine

## 2013-12-29 DIAGNOSIS — I359 Nonrheumatic aortic valve disorder, unspecified: Secondary | ICD-10-CM

## 2013-12-29 HISTORY — PX: TEE WITHOUT CARDIOVERSION: SHX5443

## 2013-12-29 LAB — GLUCOSE, CAPILLARY
GLUCOSE-CAPILLARY: 129 mg/dL — AB (ref 70–99)
GLUCOSE-CAPILLARY: 148 mg/dL — AB (ref 70–99)
GLUCOSE-CAPILLARY: 285 mg/dL — AB (ref 70–99)
Glucose-Capillary: 94 mg/dL (ref 70–99)

## 2013-12-29 SURGERY — ECHOCARDIOGRAM, TRANSESOPHAGEAL
Anesthesia: Moderate Sedation

## 2013-12-29 MED ORDER — BUTAMBEN-TETRACAINE-BENZOCAINE 2-2-14 % EX AERO
INHALATION_SPRAY | CUTANEOUS | Status: DC | PRN
  Administered 2013-12-29: 2 via TOPICAL

## 2013-12-29 MED ORDER — LISINOPRIL 10 MG PO TABS
10.0000 mg | ORAL_TABLET | Freq: Every day | ORAL | Status: DC
  Administered 2013-12-29 – 2014-01-05 (×8): 10 mg via ORAL
  Filled 2013-12-29 (×8): qty 1

## 2013-12-29 MED ORDER — SODIUM CHLORIDE 0.9 % IJ SOLN: 3.0000 mL | Freq: Two times a day (BID) | INTRAMUSCULAR | Status: DC

## 2013-12-29 MED ORDER — FENTANYL CITRATE 0.05 MG/ML IJ SOLN
INTRAMUSCULAR | Status: DC | PRN
  Administered 2013-12-29 (×3): 25 ug via INTRAVENOUS

## 2013-12-29 MED ORDER — SODIUM CHLORIDE 0.9 % IV SOLN: 250.0000 mL | INTRAVENOUS | Status: DC

## 2013-12-29 MED ORDER — MIDAZOLAM HCL 5 MG/ML IJ SOLN
INTRAMUSCULAR | Status: AC
Start: 2013-12-29 — End: 2013-12-29
  Filled 2013-12-29: qty 2

## 2013-12-29 MED ORDER — FENTANYL CITRATE 0.05 MG/ML IJ SOLN
INTRAMUSCULAR | Status: AC
  Filled 2013-12-29: qty 2

## 2013-12-29 MED ORDER — SODIUM CHLORIDE 0.9 % IJ SOLN
3.0000 mL | INTRAMUSCULAR | Status: DC | PRN
Start: 2013-12-29 — End: 2014-01-05

## 2013-12-29 MED ORDER — MIDAZOLAM HCL 10 MG/2ML IJ SOLN
INTRAMUSCULAR | Status: DC | PRN
  Administered 2013-12-29 (×2): 1 mg via INTRAVENOUS
  Administered 2013-12-29 (×2): 2 mg via INTRAVENOUS

## 2013-12-29 NOTE — Progress Notes (Signed)
  Echocardiogram Echocardiogram Transesophageal has been performed.  [name] [name] 12/29/2013, 2:42 PM

## 2013-12-29 NOTE — H&P (View-Only) (Signed)
[facility] PROGRESS NOTE  [name] [name] [id number] DOB: 1956-03-11 DOA: 12/22/2013 PCP: Philis Fendt, MD  Off Service Summary [phone] with a hx of diabetes and pancreatic CA s/p biliary stent about 1-2 mos ago presents with Strep bacteremia with sepsis/shock. Thus far CT abd without source of acute infection, 2D echo w/o veg. Cardiology consulted for TEE.  Assessment/Plan: 1. Sepsis with strep bacteremia 1. Improving with vanc and zosyn 2. Gram pos bacteremia, thus far growing Strep, awaiting sensitivities 3. CT abd/pelvis without acute findings 4. Pt admits to frequently re-using insulin syringes/needles - source? 5. Pt is s/p biliary stent placement several months ago, doubt source given timing, also stent patent on CT 6. Pt with peak lactate of over 4.5, resolved with IVF and abx 7. Pt appears to be improving, currently afebrile 8. 2D echo without vegetations - no murmurs on exam 9. Pt complained of sore throat, rapid strep neg 10. Discussed with ID - recs for repeat blood culture pending 11. Will consult Cardiology for TEE to r/o endocarditis 2. DM - putting patient on lantus 15 (takes lantus 30 at home) to prevent hypoglycemia, and adding low dose Q4H SSI. Consider increasing insulin coverage when she resumes PO intake when feeling better. 3. HTN 1. Was elevated overnight 2. Added metorolol 3. Stable currently 4. Pancreatic cancer with biliary occlusion 1. S/p biliary stent about 1-2 mos ago 2. Patent stent on CT abd 5. DVT prophylaxis 1. Heparin subQ 6. LE swelling 1. Suspect venous stasis vs secondary to amlodipine 2. Elevate LE for now  Code Status: Full Family Communication: Pt in room Disposition Plan: Pending  Antibiotics:  Vancomycin 12/23/13>>>2/9  Zosyn 12/23/13>>>2/9  HPI/Subjective: Reports feeling well.  Objective: Filed Vitals:   12/27/13 2306 12/28/13 0524 12/28/13 1012 12/28/13 1013  BP:  143/71 145/76   Pulse:  68  66  Temp: 99.2 F (37.3  C) 98.4 F (36.9 C)    TempSrc: Oral Oral    Resp:  24    Height:      Weight:      SpO2:  94%      Intake/Output Summary (Last 24 hours) at 12/28/13 1020 Last data filed at 12/28/13 1019  Gross per 24 hour  Intake 2185.83 ml  Output    300 ml  Net 1885.83 ml   Filed Weights   12/24/13 0528 12/25/13 0600 12/25/13 2226  Weight: 73.1 kg (161 lb 2.5 oz) 75.7 kg (166 lb 14.2 oz) 77.5 kg (170 lb 13.7 oz)    Exam:   General:  Awake, in mild discomfort, appears anxious  Cardiovascular: tachycardic, s1, s2, no murmurs  Respiratory: normal resp effort, no wheezing or crackles  Abdomen: soft, nondistended  Musculoskeletal: perfused, no clubbing   Data Reviewed: Basic Metabolic Panel:  Recent Labs Lab 12/23/13 1206 12/24/13 0950 12/25/13 0342 12/26/13 0557 12/27/13 0350  NA 139 139 138 138 136*  K 4.2 3.5* 3.7 3.2* 3.6*  CL 105 108 109 106 104  CO2 17* 18* 18* 20 20  GLUCOSE 165* 125* 171* 179* 263*  BUN 12 17 20 14 11   CREATININE 0.81 1.31* 1.31* 1.08 1.03  CALCIUM 8.9 8.7 8.4 8.5 8.2*   Liver Function Tests:  Recent Labs Lab 12/22/13 1630 12/24/13 0950  AST 62* 90*  ALT 36* 64*  ALKPHOS 202* 176*  BILITOT 0.4 0.7  PROT 8.1 6.2  ALBUMIN 3.6 2.6*   No results found for this basename: LIPASE, AMYLASE,  in the last 168 hours No results found  for this basename: AMMONIA,  in the last 168 hours CBC:  Recent Labs Lab 12/22/13 1630  12/23/13 1206 12/24/13 0950 12/25/13 0342 12/26/13 0557 12/27/13 0350  WBC 9.6  < > 11.0* 15.1* 8.3 5.1 4.7  NEUTROABS 9.3*  --  10.3* 13.5* 7.1 3.9  --   HGB 10.0*  < > 9.9* 9.4* 8.7* 9.1* 8.3*  HCT 31.3*  < > 31.0* 28.1* 26.6* 27.4* 25.4*  MCV 88.7  < > 89.9 88.1 88.7 85.9 86.7  PLT 161  < > 127* 101* 98* 114* 115*  < > = values in this interval not displayed. Cardiac Enzymes: No results found for this basename: CKTOTAL, CKMB, CKMBINDEX, TROPONINI,  in the last 168 hours BNP (last 3 results) No results found for  this basename: PROBNP,  in the last 8760 hours CBG:  Recent Labs Lab 12/27/13 0722 12/27/13 1209 12/27/13 1701 12/27/13 2153 12/28/13 0737  GLUCAP 143* 279* 206* 249* 190*    Recent Results (from the past 240 hour(s))  CULTURE, BLOOD (ROUTINE X 2)     Status: None   Collection Time    12/22/13  4:20 PM      Result Value Range Status   Specimen Description BLOOD LEFT ARM   Final   Special Requests BOTTLES DRAWN AEROBIC AND ANAEROBIC 3ML   Final   Culture  Setup Time     Final   Value: 12/22/2013 20:43     Performed at [facility]   Culture     Final   Value: STREPTOCOCCUS SPECIES     Note: Gram Stain Report Called to,Read Back By and Verified With: TIFFANY BANKS ON 12/24/2013 AT 12:10A BY WILEJ     Performed at [facility]   Report Status PENDING   Incomplete  CULTURE, BLOOD (ROUTINE X 2)     Status: None   Collection Time    12/22/13  4:30 PM      Result Value Range Status   Specimen Description BLOOD LEFT FOREARM   Final   Special Requests BOTTLES DRAWN AEROBIC AND ANAEROBIC 3ML   Final   Culture  Setup Time     Final   Value: 12/22/2013 20:44     Performed at [facility]   Culture     Final   Value: STREPTOCOCCUS SPECIES     Note: Gram Stain Report Called to,Read Back By and Verified With: Darrin Nipper ON 12/23/2013 AT 8:33P BY WILEJ     Performed at [facility]   Report Status PENDING   Incomplete  URINE CULTURE     Status: None   Collection Time    12/22/13  7:04 PM      Result Value Range Status   Specimen Description URINE, CATHETERIZED   Final   Special Requests NONE   Final   Culture  Setup Time     Final   Value: 12/22/2013 21:38     Performed at [place]     Final   Value: NO GROWTH     Performed at [facility]   Culture     Final   Value: NO GROWTH     Performed at [facility]   Report Status 12/23/2013 FINAL   Final  CLOSTRIDIUM DIFFICILE BY PCR     Status: None    Collection Time    12/23/13 12:19 AM      Result Value Range Status   C difficile  by pcr NEGATIVE  NEGATIVE Final   Comment: Performed at [place]     Status: None   Collection Time    12/23/13 12:19 AM      Result Value Range Status   Specimen Description STOOL   Final   Special Requests NONE   Final   Culture     Final   Value: NO SALMONELLA, SHIGELLA, CAMPYLOBACTER, YERSINIA, OR E.COLI 0157:H7 ISOLATED     Performed at [facility]   Report Status 12/26/2013 FINAL   Final  MRSA PCR SCREENING     Status: None   Collection Time    12/23/13  1:20 PM      Result Value Range Status   MRSA by PCR NEGATIVE  NEGATIVE Final   Comment:            The GeneXpert MRSA Assay (FDA     approved for NASAL specimens     only), is one component of a     comprehensive MRSA colonization     surveillance program. It is not     intended to diagnose MRSA     infection nor to guide or     monitor treatment for     MRSA infections.  RAPID STREP SCREEN     Status: None   Collection Time    12/26/13 11:22 AM      Result Value Range Status   Streptococcus, Group A Screen (Direct) NEGATIVE  NEGATIVE Final   Comment: (NOTE)     A Rapid Antigen test may result negative if the antigen level in the     sample is below the detection level of this test. The FDA has not     cleared this test as a stand-alone test therefore the rapid antigen     negative result has reflexed to a Group A Strep culture.  CULTURE, GROUP A STREP     Status: None   Collection Time    12/26/13 11:22 AM      Result Value Range Status   Specimen Description THROAT   Final   Special Requests NONE   Final   Culture     Final   Value: Culture reincubated for better growth     Performed at [facility]   Report Status PENDING   Incomplete  CULTURE, BLOOD (ROUTINE X 2)     Status: None   Collection Time    12/27/13 11:20 AM      Result Value Range Status   Specimen Description BLOOD  RIGHT ARM   Final   Special Requests BOTTLES DRAWN AEROBIC AND ANAEROBIC 10 CC   Final   Culture  Setup Time     Final   Value: 12/27/2013 14:30     Performed at [facility]   Culture     Final   Value:        BLOOD CULTURE RECEIVED NO GROWTH TO DATE CULTURE WILL BE HELD FOR 5 DAYS BEFORE ISSUING A FINAL NEGATIVE REPORT     Performed at [facility]   Report Status PENDING   Incomplete  CULTURE, BLOOD (ROUTINE X 2)     Status: None   Collection Time    12/27/13 11:28 AM      Result Value Range Status   Specimen Description BLOOD RIGHT HAND   Final   Special Requests BOTTLES DRAWN AEROBIC AND ANAEROBIC 5 CC EA   Final   Culture  Setup Time     Final   Value: 12/27/2013 14:30     Performed at [facility]   Culture     Final   Value:        BLOOD CULTURE RECEIVED NO GROWTH TO DATE CULTURE WILL BE HELD FOR 5 DAYS BEFORE ISSUING A FINAL NEGATIVE REPORT     Performed at [facility]   Report Status PENDING   Incomplete     Studies: No results found.  Scheduled Meds: . amLODipine  10 mg Oral Daily  . aspirin  81 mg Oral Daily  . cefTRIAXone (ROCEPHIN)  IV  2 g Intravenous Q24H  . feeding supplement (GLUCERNA SHAKE)  237 mL Oral BID WC  . gabapentin  300 mg Oral TID  . heparin  5,000 Units Subcutaneous Q8H  . insulin aspart  0-9 Units Subcutaneous TID WC  . insulin glargine  15 Units Subcutaneous QHS  . metoprolol tartrate  50 mg Oral BID  . pantoprazole  40 mg Oral BID  . potassium chloride  10 mEq Oral Daily  . simvastatin  10 mg Oral QHS  . sodium chloride  3 mL Intravenous Q12H  . sucralfate  1 g Oral BID   Continuous Infusions: . sodium chloride 1,000 mL (12/28/13 0340)    Principal Problem:   Sepsis Active Problems:   DM (diabetes mellitus)   HTN (hypertension)   Biliary stricture   Pancreatic cancer   Abdominal pain, epigastric   Time spent: 30min   [facility], [facility] Pager [phone]. If 7PM-7AM, please  contact night-coverage at [email or web address], password [facility] 12/28/2013, 10:20 AM  LOS: 6 days

## 2013-12-29 NOTE — Progress Notes (Signed)
[facility] back from from [facility], post TEE via [facility]. Alert and oriented, denies any pain/distress, blood pressure elevated, MD aware,  started on lisinopril. Will continue to assess patient.

## 2013-12-29 NOTE — Progress Notes (Signed)
[facility] PROGRESS NOTE  [name] [name] [id number] DOB: 04-May-1956 DOA: 12/22/2013 PCP: Philis Fendt, MD  Off Service Summary [phone] with a hx of diabetes and pancreatic CA s/p biliary stent about 1-2 mos ago presents with Strep bacteremia with sepsis/shock. Thus far CT abd without source of acute infection, 2D echo w/o veg. Cardiology consulted for TEE.  Assessment/Plan: 1. Sepsis with strep bacteremia 1. Remains on ceftriaxone 2 g IV every 24 hours 2. Blood cultures drawn on 12/22/2013 growing viridans Streptococcus, growth insufficient to obtain susceptibility testing per microbiology. Repeat blood cultures drawn on 12/27/2013 negative x2 sets. 3. CT abd/pelvis without acute findings 4. Pt admits to frequently re-using insulin syringes/needles - source? 5. Pt is s/p biliary stent placement several months ago, doubt source given timing, also stent patent on CT 6. Transesophageal echocardiogram performed today, no evidence of vegetation 2. DM - putting patient on lantus 15 (takes lantus 30 at home) to prevent hypoglycemia, and adding low dose Q4H SSI. Consider increasing insulin coverage when she resumes PO intake when feeling better. 3. HTN 1. Blood pressures remain elevated, with systolic blood pressures in the 160s to 170s today. Metoprolol 50 mg by mouth twice a day was added during this hospitalization. Will add lisinopril 10 mg by mouth daily 4. Pancreatic cancer with biliary occlusion 1. S/p biliary stent about 1-2 mos ago 2. Patent stent on CT abd 5. DVT prophylaxis 1. Heparin subQ 6. LE swelling 1. Suspect venous stasis vs secondary to amlodipine 2. Elevate LE for now  Code Status: Full Family Communication: Pt in room Disposition Plan: Pending  Antibiotics:  Vancomycin 12/23/13>>>2/9  Zosyn 12/23/13>>>2/9  HPI/Subjective: Patient's second set of blood cultures remaining negative. She underwent transesophageal echocardiogram today with no complications. TEE did not  show evidence of evidence of vegetation  Objective: Filed Vitals:   12/29/13 1440 12/29/13 1450 12/29/13 1500 12/29/13 1547  BP: 166/141 170/74 169/85 167/73  Pulse: 63 68 68 66  Temp:    98.6 F (37 C)  TempSrc:    Oral  Resp: 21 18 16 20   Height:      Weight:      SpO2: 100% 100% 94% 97%    Intake/Output Summary (Last 24 hours) at 12/29/13 1623 Last data filed at 12/29/13 1100  Gross per 24 hour  Intake   2390 ml  Output    901 ml  Net   1489 ml   Filed Weights   12/24/13 0528 12/25/13 0600 12/25/13 2226  Weight: 73.1 kg (161 lb 2.5 oz) 75.7 kg (166 lb 14.2 oz) 77.5 kg (170 lb 13.7 oz)    Exam:   General:  Awake, in mild discomfort, appears anxious  Cardiovascular: tachycardic, s1, s2, no murmurs  Respiratory: normal resp effort, no wheezing or crackles  Abdomen: soft, nondistended  Musculoskeletal: perfused, no clubbing   Data Reviewed: Basic Metabolic Panel:  Recent Labs Lab 12/23/13 1206 12/24/13 0950 12/25/13 0342 12/26/13 0557 12/27/13 0350  NA 139 139 138 138 136*  K 4.2 3.5* 3.7 3.2* 3.6*  CL 105 108 109 106 104  CO2 17* 18* 18* 20 20  GLUCOSE 165* 125* 171* 179* 263*  BUN 12 17 20 14 11   CREATININE 0.81 1.31* 1.31* 1.08 1.03  CALCIUM 8.9 8.7 8.4 8.5 8.2*   Liver Function Tests:  Recent Labs Lab 12/22/13 1630 12/24/13 0950  AST 62* 90*  ALT 36* 64*  ALKPHOS 202* 176*  BILITOT 0.4 0.7  PROT 8.1 6.2  ALBUMIN 3.6 2.6*  No results found for this basename: LIPASE, AMYLASE,  in the last 168 hours No results found for this basename: AMMONIA,  in the last 168 hours CBC:  Recent Labs Lab 12/22/13 1630  12/23/13 1206 12/24/13 0950 12/25/13 0342 12/26/13 0557 12/27/13 0350  WBC 9.6  < > 11.0* 15.1* 8.3 5.1 4.7  NEUTROABS 9.3*  --  10.3* 13.5* 7.1 3.9  --   HGB 10.0*  < > 9.9* 9.4* 8.7* 9.1* 8.3*  HCT 31.3*  < > 31.0* 28.1* 26.6* 27.4* 25.4*  MCV 88.7  < > 89.9 88.1 88.7 85.9 86.7  PLT 161  < > 127* 101* 98* 114* 115*  < > =  values in this interval not displayed. Cardiac Enzymes: No results found for this basename: CKTOTAL, CKMB, CKMBINDEX, TROPONINI,  in the last 168 hours BNP (last 3 results) No results found for this basename: PROBNP,  in the last 8760 hours CBG:  Recent Labs Lab 12/28/13 1140 12/28/13 1727 12/28/13 2034 12/29/13 0728 12/29/13 1217  GLUCAP 203* 208* 195* 94 129*    Recent Results (from the past 240 hour(s))  CULTURE, BLOOD (ROUTINE X 2)     Status: None   Collection Time    12/22/13  4:20 PM      Result Value Ref Range Status   Specimen Description BLOOD LEFT ARM   Final   Special Requests BOTTLES DRAWN AEROBIC AND ANAEROBIC 3ML   Final   Culture  Setup Time     Final   Value: 12/22/2013 20:43     Performed at [facility]   Culture     Final   Value: VIRIDANS STREPTOCOCCUS     Note: GROWTH INSUFFICIENT FOR SENSITIVITIES     Note: Gram Stain Report Called to,Read Back By and Verified With: TIFFANY BANKS ON 12/24/2013 AT 12:10A BY WILEJ     Performed at [facility]   Report Status 12/28/2013 FINAL   Final  CULTURE, BLOOD (ROUTINE X 2)     Status: None   Collection Time    12/22/13  4:30 PM      Result Value Ref Range Status   Specimen Description BLOOD LEFT FOREARM   Final   Special Requests BOTTLES DRAWN AEROBIC AND ANAEROBIC 3ML   Final   Culture  Setup Time     Final   Value: 12/22/2013 20:44     Performed at [facility]   Culture     Final   Value: VIRIDANS STREPTOCOCCUS     Note: GROWTH INSUFFICIENT FOR SENSITIVITIES     Note: Gram Stain Report Called to,Read Back By and Verified With: Darrin Nipper ON 12/23/2013 AT 8:33P BY WILEJ     Performed at [facility]   Report Status 12/28/2013 FINAL   Final  URINE CULTURE     Status: None   Collection Time    12/22/13  7:04 PM      Result Value Ref Range Status   Specimen Description URINE, CATHETERIZED   Final   Special Requests NONE   Final   Culture  Setup Time     Final   Value:  12/22/2013 21:38     Performed at [place]     Final   Value: NO GROWTH     Performed at [facility]   Culture     Final   Value: NO GROWTH     Performed at [facility]   Report  Status 12/23/2013 FINAL   Final  CLOSTRIDIUM DIFFICILE BY PCR     Status: None   Collection Time    12/23/13 12:19 AM      Result Value Ref Range Status   C difficile by pcr NEGATIVE  NEGATIVE Final   Comment: Performed at [place]     Status: None   Collection Time    12/23/13 12:19 AM      Result Value Ref Range Status   Specimen Description STOOL   Final   Special Requests NONE   Final   Culture     Final   Value: NO SALMONELLA, SHIGELLA, CAMPYLOBACTER, YERSINIA, OR E.COLI 0157:H7 ISOLATED     Performed at [facility]   Report Status 12/26/2013 FINAL   Final  MRSA PCR SCREENING     Status: None   Collection Time    12/23/13  1:20 PM      Result Value Ref Range Status   MRSA by PCR NEGATIVE  NEGATIVE Final   Comment:            The GeneXpert MRSA Assay (FDA     approved for NASAL specimens     only), is one component of a     comprehensive MRSA colonization     surveillance program. It is not     intended to diagnose MRSA     infection nor to guide or     monitor treatment for     MRSA infections.  RAPID STREP SCREEN     Status: None   Collection Time    12/26/13 11:22 AM      Result Value Ref Range Status   Streptococcus, Group A Screen (Direct) NEGATIVE  NEGATIVE Final   Comment: (NOTE)     A Rapid Antigen test may result negative if the antigen level in the     sample is below the detection level of this test. The FDA has not     cleared this test as a stand-alone test therefore the rapid antigen     negative result has reflexed to a Group A Strep culture.  CULTURE, GROUP A STREP     Status: None   Collection Time    12/26/13 11:22 AM      Result Value Ref Range Status   Specimen Description THROAT    Final   Special Requests NONE   Final   Culture     Final   Value: No Beta Hemolytic Streptococci Isolated     Performed at [facility]   Report Status 12/28/2013 FINAL   Final  CULTURE, BLOOD (ROUTINE X 2)     Status: None   Collection Time    12/27/13 11:20 AM      Result Value Ref Range Status   Specimen Description BLOOD RIGHT ARM   Final   Special Requests BOTTLES DRAWN AEROBIC AND ANAEROBIC 10 CC   Final   Culture  Setup Time     Final   Value: 12/27/2013 14:30     Performed at [facility]   Culture     Final   Value:        BLOOD CULTURE RECEIVED NO GROWTH TO DATE CULTURE WILL BE HELD FOR 5 DAYS BEFORE ISSUING A FINAL NEGATIVE REPORT     Performed at [facility]   Report Status PENDING   Incomplete  CULTURE, BLOOD (ROUTINE X 2)     Status: None  Collection Time    12/27/13 11:28 AM      Result Value Ref Range Status   Specimen Description BLOOD RIGHT HAND   Final   Special Requests BOTTLES DRAWN AEROBIC AND ANAEROBIC 5 CC EA   Final   Culture  Setup Time     Final   Value: 12/27/2013 14:30     Performed at [facility]   Culture     Final   Value:        BLOOD CULTURE RECEIVED NO GROWTH TO DATE CULTURE WILL BE HELD FOR 5 DAYS BEFORE ISSUING A FINAL NEGATIVE REPORT     Performed at [facility]   Report Status PENDING   Incomplete     Studies: No results found.  Scheduled Meds: . amLODipine  10 mg Oral Daily  . aspirin  81 mg Oral Daily  . cefTRIAXone (ROCEPHIN)  IV  2 g Intravenous Q24H  . feeding supplement (GLUCERNA SHAKE)  237 mL Oral BID WC  . gabapentin  300 mg Oral TID  . heparin  5,000 Units Subcutaneous 3 times per day  . insulin aspart  0-9 Units Subcutaneous TID WC  . insulin aspart  5 Units Subcutaneous TID WC  . insulin glargine  20 Units Subcutaneous QHS  . metoprolol tartrate  50 mg Oral BID  . pantoprazole  40 mg Oral BID  . potassium chloride  10 mEq Oral Daily  . simvastatin  10 mg Oral QHS  .  sodium chloride  3 mL Intravenous Q12H  . sodium chloride  3 mL Intravenous Q12H  . sucralfate  1 g Oral BID   Continuous Infusions: . sodium chloride 1,000 mL (12/29/13 0409)  . sodium chloride      Principal Problem:   Sepsis Active Problems:   DM (diabetes mellitus)   HTN (hypertension)   Biliary stricture   Pancreatic cancer   Abdominal pain, epigastric   Time spent: 54min   Kelvin Cellar  [facility] Pager [phone]. If 7PM-7AM, please contact night-coverage at [email or web address], password [email or web address] 12/29/2013, 4:23 PM  LOS: 7 days

## 2013-12-29 NOTE — CV Procedure (Signed)
     Transesophageal Echocardiogram Note  [name] [name] [id number] Nov 05, 1956  Procedure: Transesophageal Echocardiogram Indications: Strep bacteremia  Procedure Details Consent: Obtained Time Out: Verified patient identification, verified procedure, site/side was marked, verified correct patient position, special equipment/implants available, Radiology Safety Procedures followed,  medications/allergies/relevent history reviewed, required imaging and test results available.  Performed  Medications: Fentanyl: 75 mcg Versed: 6 mg  Left Ventrical:  Left ventricle: The cavity size was normal. There is moderate concentric LVH. Systolic function was vigorous. The estimated ejection fraction was in the range of 65% to 70%. Wall motion was normal; there were no regional wall motion abnormalities.   Mitral Valve: Trace regurgitation. No vegetation.   Aortic Valve: Trileaflet; mildly thickened leaflets. Mild to moderate regurgitation. No vegetation.   Tricuspid Valve: Moderate regurgitation. No vegetation.   Pulmonic Valve: No regurgitation. No vegetation.   Left Atrium/ Left atrial appendage: The atrium was normal in size. No LA or LAA.   Atrial septum: No PFO or ASD.   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   Complications: No apparent complications Patient did tolerate procedure well.  [name] [name], [name] Evens, MD, [facility] 12/29/2013, 2:09 PM

## 2013-12-29 NOTE — Interval H&P Note (Signed)
History and Physical Interval Note:  12/29/2013 2:08 PM  [name] [name]  has presented today for surgery, with the diagnosis of sepsis  The various methods of treatment have been discussed with the patient and family. After consideration of risks, benefits and other options for treatment, the patient has consented to  Procedure(s): TRANSESOPHAGEAL ECHOCARDIOGRAM (TEE) (N/A) as a surgical intervention .  The patient's history has been reviewed, patient examined, no change in status, stable for surgery.  I have reviewed the patient's chart and labs.  Questions were answered to the patient's satisfaction.     Cassey Hurrell, H   

## 2013-12-29 NOTE — Interval H&P Note (Signed)
History and Physical Interval Note:  12/29/2013 2:08 PM  [name] [name]  has presented today for surgery, with the diagnosis of sepsis  The various methods of treatment have been discussed with the patient and family. After consideration of risks, benefits and other options for treatment, the patient has consented to  Procedure(s): TRANSESOPHAGEAL ECHOCARDIOGRAM (TEE) (N/A) as a surgical intervention .  The patient's history has been reviewed, patient examined, no change in status, stable for surgery.  I have reviewed the patient's chart and labs.  Questions were answered to the patient's satisfaction.     [name], H

## 2013-12-30 ENCOUNTER — Encounter (HOSPITAL_COMMUNITY): Payer: Self-pay | Admitting: Cardiology

## 2013-12-30 LAB — BASIC METABOLIC PANEL
BUN: 8 mg/dL (ref 6–23)
CHLORIDE: 102 meq/L (ref 96–112)
CO2: 23 meq/L (ref 19–32)
Calcium: 8.3 mg/dL — ABNORMAL LOW (ref 8.4–10.5)
Creatinine, Ser: 0.76 mg/dL (ref 0.50–1.10)
GFR calc non Af Amer: 90 mL/min — ABNORMAL LOW (ref >90)
Glucose, Bld: 217 mg/dL — ABNORMAL HIGH (ref 70–99)
POTASSIUM: 3.2 meq/L — AB (ref 3.7–5.3)
Sodium: 137 mEq/L (ref 137–147)

## 2013-12-30 LAB — GLUCOSE, CAPILLARY
GLUCOSE-CAPILLARY: 170 mg/dL — AB (ref 70–99)
GLUCOSE-CAPILLARY: 208 mg/dL — AB (ref 70–99)
Glucose-Capillary: 70 mg/dL (ref 70–99)
Glucose-Capillary: 234 mg/dL — ABNORMAL HIGH (ref 70–99)

## 2013-12-30 LAB — CBC
HCT: 27.1 % — ABNORMAL LOW (ref 36.0–46.0)
Hemoglobin: 8.6 g/dL — ABNORMAL LOW (ref 12.0–15.0)
MCH: 28 pg (ref 26.0–34.0)
MCHC: 31.7 g/dL (ref 30.0–36.0)
MCV: 88.3 fL (ref 78.0–100.0)
Platelets: 203 10*3/uL (ref 150–400)
RBC: 3.07 MIL/uL — ABNORMAL LOW (ref 3.87–5.11)
RDW: 15 % (ref 11.5–15.5)
WBC: 4.9 10*3/uL (ref 4.0–10.5)

## 2013-12-30 MED ORDER — GLUCERNA SHAKE PO LIQD
237.0000 mL | Freq: Two times a day (BID) | ORAL | Status: DC | PRN
  Filled 2013-12-30: qty 237

## 2013-12-30 MED ORDER — SODIUM CHLORIDE 0.9 % IJ SOLN: 10.0000 mL | INTRAMUSCULAR | Status: DC | PRN

## 2013-12-30 NOTE — Progress Notes (Signed)
[facility] PROGRESS NOTE  [name] [name] DGU:[id number] DOB: [birth year] DOA: 12/22/2013 PCP: Philis Fendt, MD  Off Service Summary [phone] with a hx of diabetes and pancreatic CA s/p biliary stent about 1-2 mos ago presents with Strep bacteremia with sepsis/shock. Thus far CT abd without source of acute infection, 2D echo w/o veg. Cardiology consulted for TEE.  Assessment/Plan: 1. Sepsis with strep bacteremia 1. Remains on ceftriaxone 2 g IV every 24 hours 2. Blood cultures drawn on 12/22/2013 growing viridans Streptococcus, growth insufficient to obtain susceptibility testing per microbiology. Repeat blood cultures drawn on 12/27/2013 negative x2 sets. 3. CT abd/pelvis without acute findings 4. Transesophageal echocardiogram performed today, no evidence of vegetation 5. I discussed case with Dr. Graylon Good of infectious disease, we discussed cultures, lab results including a negative TEE. She recommended treating with ceftriaxone for a total of 2 weeks. Her second set of blood cultures remain negative, plan for PICC line placement. 2. DM - Her blood sugars are elevated, presently on 20 units. Will increase her Lantus to 25 units Gerty q ha. Continue sliding scale coverage.  3. HTN 1. Blood pressures better controlled as SBP's today 's to 140's. Lisinopril 10mg  PO q daily added on 12/29/13.  4. Pancreatic cancer with biliary occlusion 1. S/p biliary stent about 1-2 mos ago 2. Patent stent on CT abd 5. DVT prophylaxis 1. Heparin subQ 6. LE swelling 1. Suspect venous stasis vs secondary to amlodipine 2. Elevate LE for now  Code Status: Full Disposition Plan: Will place PICC line today, anticipate discharge in the next 24 hours  Antibiotics:  Vancomycin 12/23/13>>>2/9  Zosyn 12/23/13>>>2/9  HPI/Subjective: Patient's second set of blood cultures remaining negative. She underwent transesophageal echocardiogram today with no complications. TEE did not show evidence of evidence of  vegetation  Patient reporting feeling better, tolerating by mouth intake, denies fevers or chills.  Objective: Filed Vitals:   12/29/13 2018 12/30/13 0523 12/30/13 0915 12/30/13 0945  BP: 152/66 128/62 144/65 144/65  Pulse: 79 65 66   Temp: 98.6 F (37 C) 98 F (36.7 C) 98.3 F (36.8 C)   TempSrc: Oral Oral Oral   Resp: 18 16 16    Height:      Weight:      SpO2: 100% 95% 100%     Intake/Output Summary (Last 24 hours) at 12/30/13 1147 Last data filed at 12/30/13 0800  Gross per 24 hour  Intake 1913.33 ml  Output    650 ml  Net 1263.33 ml   Filed Weights   12/24/13 0528 12/25/13 0600 12/25/13 2226  Weight: 73.1 kg (161 lb 2.5 oz) 75.7 kg (166 lb 14.2 oz) 77.5 kg (170 lb 13.7 oz)    Exam:   General:  Awake, in mild discomfort, appears anxious  Cardiovascular: tachycardic, s1, s2, no murmurs  Respiratory: normal resp effort, no wheezing or crackles  Abdomen: soft, nondistended  Musculoskeletal: perfused, no clubbing   Data Reviewed: Basic Metabolic Panel:  Recent Labs Lab 12/24/13 0950 12/25/13 0342 12/26/13 0557 12/27/13 0350 12/30/13 0509  NA 139 138 138 136* 137  K 3.5* 3.7 3.2* 3.6* 3.2*  CL 108 109 106 104 102  CO2 18* 18* 20 20 23   GLUCOSE 125* 171* 179* 263* 217*  BUN 17 20 14 11 8   CREATININE 1.31* 1.31* 1.08 1.03 0.76  CALCIUM 8.7 8.4 8.5 8.2* 8.3*   Liver Function Tests:  Recent Labs Lab 12/24/13 0950  AST 90*  ALT 64*  ALKPHOS 176*  BILITOT 0.7  PROT 6.2  ALBUMIN 2.6*   No results found for this basename: LIPASE, AMYLASE,  in the last 168 hours No results found for this basename: AMMONIA,  in the last 168 hours CBC:  Recent Labs Lab 12/23/13 1206 12/24/13 0950 12/25/13 0342 12/26/13 0557 12/27/13 0350 12/30/13 0509  WBC 11.0* 15.1* 8.3 5.1 4.7 4.9  NEUTROABS 10.3* 13.5* 7.1 3.9  --   --   HGB 9.9* 9.4* 8.7* 9.1* 8.3* 8.6*  HCT 31.0* 28.1* 26.6* 27.4* 25.4* 27.1*  MCV 89.9 88.1 88.7 85.9 86.7 88.3  PLT 127* 101* 98*  114* 115* 203   Cardiac Enzymes: No results found for this basename: CKTOTAL, CKMB, CKMBINDEX, TROPONINI,  in the last 168 hours BNP (last 3 results) No results found for this basename: PROBNP,  in the last 8760 hours CBG:  Recent Labs Lab 12/29/13 0728 12/29/13 1217 12/29/13 1643 12/29/13 2127 12/30/13 0703  GLUCAP 94 129* 148* 285* 170*    Recent Results (from the past 240 hour(s))  CULTURE, BLOOD (ROUTINE X 2)     Status: None   Collection Time    12/22/13  4:20 PM      Result Value Ref Range Status   Specimen Description BLOOD LEFT ARM   Final   Special Requests BOTTLES DRAWN AEROBIC AND ANAEROBIC 3ML   Final   Culture  Setup Time     Final   Value: 12/22/2013 20:43     Performed at [facility]   Culture     Final   Value: VIRIDANS STREPTOCOCCUS     Note: GROWTH INSUFFICIENT FOR SENSITIVITIES     Note: Gram Stain Report Called to,Read Back By and Verified With: TIFFANY BANKS ON 12/24/2013 AT 12:10A BY WILEJ     Performed at [facility]   Report Status 12/28/2013 FINAL   Final  CULTURE, BLOOD (ROUTINE X 2)     Status: None   Collection Time    12/22/13  4:30 PM      Result Value Ref Range Status   Specimen Description BLOOD LEFT FOREARM   Final   Special Requests BOTTLES DRAWN AEROBIC AND ANAEROBIC 3ML   Final   Culture  Setup Time     Final   Value: 12/22/2013 20:44     Performed at [facility]   Culture     Final   Value: VIRIDANS STREPTOCOCCUS     Note: GROWTH INSUFFICIENT FOR SENSITIVITIES     Note: Gram Stain Report Called to,Read Back By and Verified With: Darrin Nipper ON 12/23/2013 AT 8:33P BY WILEJ     Performed at [facility]   Report Status 12/28/2013 FINAL   Final  URINE CULTURE     Status: None   Collection Time    12/22/13  7:04 PM      Result Value Ref Range Status   Specimen Description URINE, CATHETERIZED   Final   Special Requests NONE   Final   Culture  Setup Time     Final   Value: 12/22/2013 21:38      Performed at [place]     Final   Value: NO GROWTH     Performed at [facility]   Culture     Final   Value: NO GROWTH     Performed at [facility]   Report Status 12/23/2013 FINAL   Final  CLOSTRIDIUM DIFFICILE BY PCR     Status: None   Collection Time  12/23/13 12:19 AM      Result Value Ref Range Status   C difficile by pcr NEGATIVE  NEGATIVE Final   Comment: Performed at [place]     Status: None   Collection Time    12/23/13 12:19 AM      Result Value Ref Range Status   Specimen Description STOOL   Final   Special Requests NONE   Final   Culture     Final   Value: NO SALMONELLA, SHIGELLA, CAMPYLOBACTER, YERSINIA, OR E.COLI 0157:H7 ISOLATED     Performed at [facility]   Report Status 12/26/2013 FINAL   Final  MRSA PCR SCREENING     Status: None   Collection Time    12/23/13  1:20 PM      Result Value Ref Range Status   MRSA by PCR NEGATIVE  NEGATIVE Final   Comment:            The GeneXpert MRSA Assay (FDA     approved for NASAL specimens     only), is one component of a     comprehensive MRSA colonization     surveillance program. It is not     intended to diagnose MRSA     infection nor to guide or     monitor treatment for     MRSA infections.  RAPID STREP SCREEN     Status: None   Collection Time    12/26/13 11:22 AM      Result Value Ref Range Status   Streptococcus, Group A Screen (Direct) NEGATIVE  NEGATIVE Final   Comment: (NOTE)     A Rapid Antigen test may result negative if the antigen level in the     sample is below the detection level of this test. The FDA has not     cleared this test as a stand-alone test therefore the rapid antigen     negative result has reflexed to a Group A Strep culture.  CULTURE, GROUP A STREP     Status: None   Collection Time    12/26/13 11:22 AM      Result Value Ref Range Status   Specimen Description THROAT   Final   Special Requests  NONE   Final   Culture     Final   Value: No Beta Hemolytic Streptococci Isolated     Performed at [facility]   Report Status 12/28/2013 FINAL   Final  CULTURE, BLOOD (ROUTINE X 2)     Status: None   Collection Time    12/27/13 11:20 AM      Result Value Ref Range Status   Specimen Description BLOOD RIGHT ARM   Final   Special Requests BOTTLES DRAWN AEROBIC AND ANAEROBIC 10 CC   Final   Culture  Setup Time     Final   Value: 12/27/2013 14:30     Performed at [facility]   Culture     Final   Value:        BLOOD CULTURE RECEIVED NO GROWTH TO DATE CULTURE WILL BE HELD FOR 5 DAYS BEFORE ISSUING A FINAL NEGATIVE REPORT     Performed at [facility]   Report Status PENDING   Incomplete  CULTURE, BLOOD (ROUTINE X 2)     Status: None   Collection Time    12/27/13 11:28 AM      Result Value Ref Range Status   Specimen Description BLOOD  RIGHT HAND   Final   Special Requests BOTTLES DRAWN AEROBIC AND ANAEROBIC 5 CC EA   Final   Culture  Setup Time     Final   Value: 12/27/2013 14:30     Performed at [facility]   Culture     Final   Value:        BLOOD CULTURE RECEIVED NO GROWTH TO DATE CULTURE WILL BE HELD FOR 5 DAYS BEFORE ISSUING A FINAL NEGATIVE REPORT     Performed at [facility]   Report Status PENDING   Incomplete     Studies: No results found.  Scheduled Meds: . amLODipine  10 mg Oral Daily  . aspirin  81 mg Oral Daily  . cefTRIAXone (ROCEPHIN)  IV  2 g Intravenous Q24H  . feeding supplement (GLUCERNA SHAKE)  237 mL Oral BID WC  . gabapentin  300 mg Oral TID  . heparin  5,000 Units Subcutaneous 3 times per day  . insulin aspart  0-9 Units Subcutaneous TID WC  . insulin aspart  5 Units Subcutaneous TID WC  . insulin glargine  20 Units Subcutaneous QHS  . lisinopril  10 mg Oral Daily  . metoprolol tartrate  50 mg Oral BID  . pantoprazole  40 mg Oral BID  . potassium chloride  10 mEq Oral Daily  . simvastatin  10 mg Oral QHS   . sodium chloride  3 mL Intravenous Q12H  . sodium chloride  3 mL Intravenous Q12H  . sucralfate  1 g Oral BID   Continuous Infusions: . sodium chloride 1,000 mL (12/30/13 0201)  . sodium chloride      Principal Problem:   Sepsis Active Problems:   DM (diabetes mellitus)   HTN (hypertension)   Biliary stricture   Pancreatic cancer   Abdominal pain, epigastric   Time spent: 86min   Kelvin Cellar  [facility] Pager [phone]. If 7PM-7AM, please contact night-coverage at [email or web address], password [facility] 12/30/2013, 11:47 AM  LOS: 8 days

## 2013-12-30 NOTE — Progress Notes (Signed)
Inpatient Diabetes Program Recommendations  AACE/ADA: New Consensus Statement on Inpatient Glycemic Control (2013)  Target Ranges:  Prepandial:   less than 140 mg/dL      Peak postprandial:   less than 180 mg/dL (1-2 hours)      Critically ill patients:  140 - 180 mg/dL   Results for AILA, TERRA (MRN [id number]) as of 12/30/2013 09:07  Ref. Range 12/29/2013 07:28 12/29/2013 12:17 12/29/2013 16:43 12/29/2013 21:27 12/30/2013 07:03  Glucose-Capillary Latest Range: 70-99 mg/dL 94 129 (H) 148 (H) 285 (H) 170 (H)    Inpatient Diabetes Program Recommendations Correction (SSI): Please consider ordering Novolog bedtime correction scale.  Thanks, Barnie Alderman, RN, MSN, CCRN Diabetes Coordinator Inpatient Diabetes Program [phone] (Team Pager) [phone] ([facility] office) [phone] [facility] office)

## 2013-12-30 NOTE — Progress Notes (Signed)
[facility]  Patient Status:   New pt for [facility] this admission  [facility] is providing the following services: HHRN and home infusion pharmacy for home IV ABX. [facility] hospital team will follow pt and support DC home when deemed appropriate by MD team.   If patient discharges after hours, please call [phone].   Larry Sierras 12/30/2013, 6:14 PM

## 2013-12-30 NOTE — Progress Notes (Signed)
NUTRITION FOLLOW UP  Intervention:   Will modify Glucerna supplement to PRN as pt with 100% PO intake Will continue to monitor  Nutrition Dx:   Inadequate oral intake related to AMS as evidenced by PO intake 0%-improving    Goal:   Pt to meet >/= 90% of their estimated nutrition needs    Monitor:   Total protein/energy intake, labs, weight, glucose profile  Assessment:   -Attempted to gain further nutrition/food hx from patient. On first attempt, pt was lethargic and did not respond to questions  -On second attempt, pt was largely concerned with adjusting herself in bed and back pain. Did not want to respond to questions regarding weight and/or appetite changes  -Has had 0% PO intake per discussion with RN and Nurse Techs. Plan to transfer to ICU per RN  -Pt has been evaluated by inpatient RD in 07/2013 and outpatient oncology dietitian in 06/2013. Reported usual body weight of 149-151 lbs. Drinks Boost occasionally. Was eating well with some nausea. Will continue to monitor.  -Will order Glucerna shakes d/t hx of DM2  -Weight trend indicates pt weight stays within 149-155 per previous medical records  -Admitted with sepsis and 104.47F fevers  2/12: -Pt with improved appetite,PO intake 100% -Was undergoing sterile procedure during time of follow up -Per discussion with Nurse Tech, pt eating well. Had not seen pt receive Glucerna shakes.  -Pt with elevated blood glucose. Diabetes Coordinator recommended Novolog bedtime correction scale. Will also modify Glucerna to PRN to assist with regulating blood glucose -Low K   Height: Ht Readings from Last 1 Encounters:  12/25/13 5\' 3"  (1.6 m)    Weight Status:   Wt Readings from Last 1 Encounters:  12/25/13 170 lb 13.7 oz (77.5 kg)    Re-estimated needs:  Kcal: 1500-1700  Protein: 105-115 gram  Fluid: >/= 2100 ml/daily   Skin: WDL  Diet Order: Carb Control   Intake/Output Summary (Last 24 hours) at 12/30/13 1613 Last data  filed at 12/30/13 1230  Gross per 24 hour  Intake 2038.33 ml  Output      0 ml  Net 2038.33 ml    Last BM: 2/11   Labs:   Recent Labs Lab 12/26/13 0557 12/27/13 0350 12/30/13 0509  NA 138 136* 137  K 3.2* 3.6* 3.2*  CL 106 104 102  CO2 20 20 23   BUN 14 11 8   CREATININE 1.08 1.03 0.76  CALCIUM 8.5 8.2* 8.3*  GLUCOSE 179* 263* 217*    CBG (last 3)   Recent Labs  12/29/13 2127 12/30/13 0703 12/30/13 1153  GLUCAP 285* 170* 208*    Scheduled Meds: . amLODipine  10 mg Oral Daily  . aspirin  81 mg Oral Daily  . cefTRIAXone (ROCEPHIN)  IV  2 g Intravenous Q24H  . feeding supplement (GLUCERNA SHAKE)  237 mL Oral BID WC  . gabapentin  300 mg Oral TID  . heparin  5,000 Units Subcutaneous 3 times per day  . insulin aspart  0-9 Units Subcutaneous TID WC  . insulin aspart  5 Units Subcutaneous TID WC  . insulin glargine  20 Units Subcutaneous QHS  . lisinopril  10 mg Oral Daily  . metoprolol tartrate  50 mg Oral BID  . pantoprazole  40 mg Oral BID  . potassium chloride  10 mEq Oral Daily  . simvastatin  10 mg Oral QHS  . sodium chloride  3 mL Intravenous Q12H  . sodium chloride  3 mL Intravenous Q12H  .  sucralfate  1 g Oral BID    Continuous Infusions: . sodium chloride 1,000 mL (12/30/13 1545)  . sodium chloride      Atlee Abide MS RD LDN Clinical Dietitian [phone]

## 2013-12-30 NOTE — Progress Notes (Signed)
Peripherally Inserted Central Catheter/Midline Placement  The IV Nurse has discussed with the patient and/or persons authorized to consent for the patient, the purpose of this procedure and the potential benefits and risks involved with this procedure.  The benefits include less needle sticks, lab draws from the catheter and patient may be discharged home with the catheter.  Risks include, but not limited to, infection, bleeding, blood clot (thrombus formation), and puncture of an artery; nerve damage and irregular heat beat.  Alternatives to this procedure were also discussed.  PICC/Midline Placement Documentation        [name] [name] 12/30/2013, 3:12 PM

## 2013-12-31 ENCOUNTER — Inpatient Hospital Stay (HOSPITAL_COMMUNITY): Payer: Medicaid Other

## 2013-12-31 LAB — URINALYSIS, ROUTINE W REFLEX MICROSCOPIC
BILIRUBIN URINE: NEGATIVE
GLUCOSE, UA: 500 mg/dL — AB
Ketones, ur: NEGATIVE mg/dL
Leukocytes, UA: NEGATIVE
Nitrite: NEGATIVE
Protein, ur: 30 mg/dL — AB
Specific Gravity, Urine: 1.021 (ref 1.005–1.030)
Urobilinogen, UA: 0.2 mg/dL (ref 0.0–1.0)
pH: 6 (ref 5.0–8.0)

## 2013-12-31 LAB — CBC
HCT: 24.7 % — ABNORMAL LOW (ref 36.0–46.0)
Hemoglobin: 7.9 g/dL — ABNORMAL LOW (ref 12.0–15.0)
MCH: 28.2 pg (ref 26.0–34.0)
MCHC: 32 g/dL (ref 30.0–36.0)
MCV: 88.2 fL (ref 78.0–100.0)
PLATELETS: 119 10*3/uL — AB (ref 150–400)
RBC: 2.8 MIL/uL — ABNORMAL LOW (ref 3.87–5.11)
RDW: 15.4 % (ref 11.5–15.5)
WBC: 16.4 10*3/uL — AB (ref 4.0–10.5)

## 2013-12-31 LAB — DIFFERENTIAL
BASOS ABS: 0 10*3/uL (ref 0.0–0.1)
Basophils Relative: 0 % (ref 0–1)
Eosinophils Absolute: 0 10*3/uL (ref 0.0–0.7)
Eosinophils Relative: 0 % (ref 0–5)
Lymphocytes Relative: 2 % — ABNORMAL LOW (ref 12–46)
Lymphs Abs: 0.3 10*3/uL — ABNORMAL LOW (ref 0.7–4.0)
MONOS PCT: 5 % (ref 3–12)
Monocytes Absolute: 0.9 10*3/uL (ref 0.1–1.0)
NEUTROS ABS: 15.2 10*3/uL — AB (ref 1.7–7.7)
NEUTROS PCT: 93 % — AB (ref 43–77)

## 2013-12-31 LAB — GLUCOSE, CAPILLARY
GLUCOSE-CAPILLARY: 166 mg/dL — AB (ref 70–99)
GLUCOSE-CAPILLARY: 227 mg/dL — AB (ref 70–99)
Glucose-Capillary: 220 mg/dL — ABNORMAL HIGH (ref 70–99)
Glucose-Capillary: 236 mg/dL — ABNORMAL HIGH (ref 70–99)

## 2013-12-31 LAB — URINE MICROSCOPIC-ADD ON

## 2013-12-31 LAB — BASIC METABOLIC PANEL
BUN: 12 mg/dL (ref 6–23)
CO2: 20 mEq/L (ref 19–32)
CREATININE: 0.87 mg/dL (ref 0.50–1.10)
Calcium: 8.3 mg/dL — ABNORMAL LOW (ref 8.4–10.5)
Chloride: 103 mEq/L (ref 96–112)
GFR calc non Af Amer: 71 mL/min — ABNORMAL LOW (ref >90)
GFR, EST AFRICAN AMERICAN: 82 mL/min — AB (ref >90)
Glucose, Bld: 147 mg/dL — ABNORMAL HIGH (ref 70–99)
Potassium: 3.6 mEq/L — ABNORMAL LOW (ref 3.7–5.3)
Sodium: 137 mEq/L (ref 137–147)

## 2013-12-31 NOTE — Progress Notes (Signed)
[facility] PROGRESS NOTE  [name] [name] [id number] DOB: 13-Mar-1956 DOA: 12/22/2013 PCP: Philis Fendt, MD   Assessment/Plan: 1. Sepsis with strep bacteremia Overnight she spiked a temperature of 102.9, with labs now showing leukocytosis with WBC of 16,400. Will obtain a CXR and U/A. Spoke with Dr Baxter Flattery of ID who will evaluate patient this morning. Remains on ceftriaxone 2 g IV every 24 hours 1. Blood cultures drawn on 12/22/2013 growing viridans Streptococcus, growth insufficient to obtain susceptibility testing per microbiology. Repeat blood cultures drawn on 12/27/2013 negative x2 sets. With temperature spike overnight, blood cultures have been repeated.  2. Transesophageal echocardiogram performed today, no evidence of vegetation 3. I discussed case with Dr. Graylon Good of infectious disease on 12/30/13, we discussed cultures, lab results including a negative TEE. She recommended treating with ceftriaxone for a total of 2 weeks as Midline was placed on 12/30/13.  2. DM - Her blood sugars are elevated, presently on 20 units. Will increase her Lantus to 25 units [place] q ha. Continue sliding scale coverage.  3. HTN 1. Blood pressures better controlled as SBP's today 's to 140's. Lisinopril 10mg  PO q daily added on 12/29/13.  4. Pancreatic cancer with biliary occlusion 1. S/p biliary stent about 1-2 mos ago 2. Patent stent on CT abd 5. DVT prophylaxis 1. Heparin subQ 6. LE swelling 1. Suspect venous stasis vs secondary to amlodipine 2. Elevate LE for now  Code Status: Full Disposition Plan: Midline placed on 12/30/13, patient spiking a temp overnight for which blood cultures, CXR and UA have been ordered. I spoke with Dr Baxter Flattery of ID who will see her in consultation today.   Antibiotics:  Vancomycin 12/23/13>>>2/9  Zosyn 12/23/13>>>2/9  Rocephin 2 g IV q 24 hours.   HPI/Subjective: Patient's second set of blood cultures remaining negative. She underwent transesophageal echocardiogram  today with no complications. TEE did not show evidence of evidence of vegetation  Patient spiking a temp of 102.9 overnight, reporting chills. She states not feeling as well as she did yesterday, I spoke with ID who will see her in consult today.   Objective: Filed Vitals:   12/30/13 2200 12/30/13 2305 12/31/13 0116 12/31/13 0503  BP:    114/58  Pulse:    71  Temp: 101.6 F (38.7 C) 98.4 F (36.9 C) 98.6 F (37 C) 98.6 F (37 C)  TempSrc: Oral  Oral Oral  Resp:    16  Height:      Weight:      SpO2:    94%    Intake/Output Summary (Last 24 hours) at 12/31/13 0921 Last data filed at 12/31/13 0825  Gross per 24 hour  Intake   1030 ml  Output    600 ml  Net    430 ml   Filed Weights   12/24/13 0528 12/25/13 0600 12/25/13 2226  Weight: 73.1 kg (161 lb 2.5 oz) 75.7 kg (166 lb 14.2 oz) 77.5 kg (170 lb 13.7 oz)    Exam:   General:  Awake, in mild discomfort, appears anxious  Cardiovascular: tachycardic, s1, s2, no murmurs  Respiratory: normal resp effort, no wheezing or crackles  Abdomen: soft, nondistended  Musculoskeletal: perfused, no clubbing   Data Reviewed: Basic Metabolic Panel:  Recent Labs Lab 12/25/13 0342 12/26/13 0557 12/27/13 0350 12/30/13 0509 12/31/13 0557  NA 138 138 136* 137 137  K 3.7 3.2* 3.6* 3.2* 3.6*  CL 109 106 104 102 103  CO2 18* 20 20 23 20   GLUCOSE 171* 179* 263*  217* 147*  BUN 20 14 11 8 12   CREATININE 1.31* 1.08 1.03 0.76 0.87  CALCIUM 8.4 8.5 8.2* 8.3* 8.3*   Liver Function Tests:  Recent Labs Lab 12/24/13 0950  AST 90*  ALT 64*  ALKPHOS 176*  BILITOT 0.7  PROT 6.2  ALBUMIN 2.6*   No results found for this basename: LIPASE, AMYLASE,  in the last 168 hours No results found for this basename: AMMONIA,  in the last 168 hours CBC:  Recent Labs Lab 12/24/13 0950 12/25/13 0342 12/26/13 0557 12/27/13 0350 12/30/13 0509 12/31/13 0557  WBC 15.1* 8.3 5.1 4.7 4.9 16.4*  NEUTROABS 13.5* 7.1 3.9  --   --   --   HGB  9.4* 8.7* 9.1* 8.3* 8.6* 7.9*  HCT 28.1* 26.6* 27.4* 25.4* 27.1* 24.7*  MCV 88.1 88.7 85.9 86.7 88.3 88.2  PLT 101* 98* 114* 115* 203 119*   Cardiac Enzymes: No results found for this basename: CKTOTAL, CKMB, CKMBINDEX, TROPONINI,  in the last 168 hours BNP (last 3 results) No results found for this basename: PROBNP,  in the last 8760 hours CBG:  Recent Labs Lab 12/29/13 2127 12/30/13 0703 12/30/13 1153 12/30/13 1646 12/30/13 2126  GLUCAP 285* 170* 208* 70 234*    Recent Results (from the past 240 hour(s))  CULTURE, BLOOD (ROUTINE X 2)     Status: None   Collection Time    12/22/13  4:20 PM      Result Value Ref Range Status   Specimen Description BLOOD LEFT ARM   Final   Special Requests BOTTLES DRAWN AEROBIC AND ANAEROBIC 3ML   Final   Culture  Setup Time     Final   Value: 12/22/2013 20:43     Performed at [facility]   Culture     Final   Value: VIRIDANS STREPTOCOCCUS     Note: GROWTH INSUFFICIENT FOR SENSITIVITIES     Note: Gram Stain Report Called to,Read Back By and Verified With: TIFFANY BANKS ON 12/24/2013 AT 12:10A BY WILEJ     Performed at [facility]   Report Status 12/28/2013 FINAL   Final  CULTURE, BLOOD (ROUTINE X 2)     Status: None   Collection Time    12/22/13  4:30 PM      Result Value Ref Range Status   Specimen Description BLOOD LEFT FOREARM   Final   Special Requests BOTTLES DRAWN AEROBIC AND ANAEROBIC 3ML   Final   Culture  Setup Time     Final   Value: 12/22/2013 20:44     Performed at [facility]   Culture     Final   Value: VIRIDANS STREPTOCOCCUS     Note: GROWTH INSUFFICIENT FOR SENSITIVITIES     Note: Gram Stain Report Called to,Read Back By and Verified With: Darrin Nipper ON 12/23/2013 AT 8:33P BY WILEJ     Performed at [facility]   Report Status 12/28/2013 FINAL   Final  URINE CULTURE     Status: None   Collection Time    12/22/13  7:04 PM      Result Value Ref Range Status   Specimen  Description URINE, CATHETERIZED   Final   Special Requests NONE   Final   Culture  Setup Time     Final   Value: 12/22/2013 21:38     Performed at [place]     Final   Value: NO GROWTH  Performed at [facility]     Final   Value: NO GROWTH     Performed at [facility]   Report Status 12/23/2013 FINAL   Final  CLOSTRIDIUM DIFFICILE BY PCR     Status: None   Collection Time    12/23/13 12:19 AM      Result Value Ref Range Status   C difficile by pcr NEGATIVE  NEGATIVE Final   Comment: Performed at [place]     Status: None   Collection Time    12/23/13 12:19 AM      Result Value Ref Range Status   Specimen Description STOOL   Final   Special Requests NONE   Final   Culture     Final   Value: NO SALMONELLA, SHIGELLA, CAMPYLOBACTER, YERSINIA, OR E.COLI 0157:H7 ISOLATED     Performed at [facility]   Report Status 12/26/2013 FINAL   Final  MRSA PCR SCREENING     Status: None   Collection Time    12/23/13  1:20 PM      Result Value Ref Range Status   MRSA by PCR NEGATIVE  NEGATIVE Final   Comment:            The GeneXpert MRSA Assay (FDA     approved for NASAL specimens     only), is one component of a     comprehensive MRSA colonization     surveillance program. It is not     intended to diagnose MRSA     infection nor to guide or     monitor treatment for     MRSA infections.  RAPID STREP SCREEN     Status: None   Collection Time    12/26/13 11:22 AM      Result Value Ref Range Status   Streptococcus, Group A Screen (Direct) NEGATIVE  NEGATIVE Final   Comment: (NOTE)     A Rapid Antigen test may result negative if the antigen level in the     sample is below the detection level of this test. The FDA has not     cleared this test as a stand-alone test therefore the rapid antigen     negative result has reflexed to a Group A Strep culture.  CULTURE, GROUP A STREP     Status:  None   Collection Time    12/26/13 11:22 AM      Result Value Ref Range Status   Specimen Description THROAT   Final   Special Requests NONE   Final   Culture     Final   Value: No Beta Hemolytic Streptococci Isolated     Performed at [facility]   Report Status 12/28/2013 FINAL   Final  CULTURE, BLOOD (ROUTINE X 2)     Status: None   Collection Time    12/27/13 11:20 AM      Result Value Ref Range Status   Specimen Description BLOOD RIGHT ARM   Final   Special Requests BOTTLES DRAWN AEROBIC AND ANAEROBIC 10 CC   Final   Culture  Setup Time     Final   Value: 12/27/2013 14:30     Performed at [facility]   Culture     Final   Value:        BLOOD CULTURE RECEIVED NO GROWTH TO DATE CULTURE WILL BE HELD FOR 5 DAYS BEFORE ISSUING A FINAL NEGATIVE REPORT  Performed at [facility]   Report Status PENDING   Incomplete  CULTURE, BLOOD (ROUTINE X 2)     Status: None   Collection Time    12/27/13 11:28 AM      Result Value Ref Range Status   Specimen Description BLOOD RIGHT HAND   Final   Special Requests BOTTLES DRAWN AEROBIC AND ANAEROBIC 5 CC EA   Final   Culture  Setup Time     Final   Value: 12/27/2013 14:30     Performed at [facility]   Culture     Final   Value:        BLOOD CULTURE RECEIVED NO GROWTH TO DATE CULTURE WILL BE HELD FOR 5 DAYS BEFORE ISSUING A FINAL NEGATIVE REPORT     Performed at [facility]   Report Status PENDING   Incomplete     Studies: No results found.  Scheduled Meds: . amLODipine  10 mg Oral Daily  . aspirin  81 mg Oral Daily  . cefTRIAXone (ROCEPHIN)  IV  2 g Intravenous Q24H  . gabapentin  300 mg Oral TID  . heparin  5,000 Units Subcutaneous 3 times per day  . insulin aspart  0-9 Units Subcutaneous TID WC  . insulin aspart  5 Units Subcutaneous TID WC  . insulin glargine  20 Units Subcutaneous QHS  . lisinopril  10 mg Oral Daily  . metoprolol tartrate  50 mg Oral BID  . pantoprazole  40 mg Oral  BID  . potassium chloride  10 mEq Oral Daily  . simvastatin  10 mg Oral QHS  . sodium chloride  3 mL Intravenous Q12H  . sodium chloride  3 mL Intravenous Q12H  . sucralfate  1 g Oral BID   Continuous Infusions: . sodium chloride 1,000 mL (12/30/13 1545)  . sodium chloride      Principal Problem:   Sepsis Active Problems:   DM (diabetes mellitus)   HTN (hypertension)   Biliary stricture   Pancreatic cancer   Abdominal pain, epigastric   Time spent: 45min   Kelvin Cellar  [facility] Pager [phone]. If 7PM-7AM, please contact night-coverage at [email or web address], password [facility] 12/31/2013, 9:21 AM  LOS: 9 days

## 2013-12-31 NOTE — Progress Notes (Signed)
Assess Rt arm Midline.  Large amount of liquid drainage noted under dressing.  Midline leaks while being flushed.    If pt needs line for home antibiotics recommend replacing midline.  Floor RN to notify MD. 

## 2013-12-31 NOTE — Consult Note (Signed)
[facility]  Total days of antibiotics 10        Day 5 ceftriaxone        (6 days of piptazo/ and 5 days of vanco)       Reason for Consult: fever, strep viridans bacteremia    Referring Physician: zamora  Principal Problem:   Sepsis Active Problems:   DM (diabetes mellitus)   HTN (hypertension)   Biliary stricture   Pancreatic cancer   Abdominal pain, epigastric    HPI: Hildagard Sobecki is a 58 y.o. 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 She has had abdomen/pelvis 11/01/2013 during a visit to emergency Department for evaluation of abdominal pain. The pancreatic mass was stable. Stable peripancreatic lymph nodes. There was no evidence of metastatic disease. 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   She was admitted on 2/4 for fevers of 104.50F adn altered mental status x 1-2 days, clay colored stools x 2 wks, and , plus one day of diarrhea. On admit, she was started on vancomycin and piptazo. She became afebrile after the 2nd day of hospitalization. Infectious work up showed viridans strep ( 2/2 sets) but insufficent growth. 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 She underwent TEE which was negative for vegetation but did see AV, MV, and TV regurgitation. she was changed to ceftriaxone to treat bacteremiabut then had high fever of 102.5 and increase in WBC from 4.9 to 16.4 overnight. ID asked to help with further management. Repeat blood cx drawn on 2/13 as well cxr ,which was not c/w HCAP.   Past Medical History  Diagnosis Date  . DM (diabetes mellitus), type 2       requires insulin  . Hypertension   . Depression   . Anxiety   . Hyperlipidemia   . Pancreatitis   . GERD (gastroesophageal reflux disease)   . Pancreatic cancer 05/2013    adenocarcinoma on ERCP/FNA  . Hearing impaired     lost the last of her hearing aids and cn not get another one.  hearing is better via right ear.   Marland Kitchen History of radiation therapy 07/20/13-08/09/13    Pancreas 37.5Gy  . DDD (degenerative disc disease), lumbar   . Chronic back pain     Allergies:  Allergies  Allergen Reactions  . Betadine [Povidone Iodine] Itching and Rash    MEDICATIONS: . amLODipine  10 mg Oral Daily  . aspirin  81 mg Oral Daily  . cefTRIAXone (ROCEPHIN)  IV  2 g Intravenous Q24H  . gabapentin  300 mg Oral TID  . heparin  5,000 Units Subcutaneous 3 times per day  . insulin aspart  0-9 Units Subcutaneous TID WC  . insulin aspart  5 Units Subcutaneous TID WC  . insulin glargine  20 Units Subcutaneous QHS  . lisinopril  10 mg Oral Daily  . metoprolol tartrate  50 mg Oral BID  . pantoprazole  40 mg Oral BID  . potassium chloride  10 mEq Oral Daily  . simvastatin  10 mg Oral QHS  . sodium chloride  3 mL Intravenous Q12H  . sodium chloride  3 mL Intravenous Q12H  .  sucralfate  1 g Oral BID    History  Substance Use Topics  . Smoking status: Former Smoker -- 0.50 packs/day for 42 years    Quit date: 05/28/2013  . Smokeless tobacco: Never Used  . Alcohol Use: No    Family History  Problem Relation Age of Onset  . Diabetes type II Mother   . Hypertension Mother   . Heart attack Father   . HIV/AIDS Brother    Review of Systems  Constitutional: Negative for fever, chills, diaphoresis, activity change, appetite change, fatigue and unexpected weight change.  HENT: Negative for congestion, sore throat, rhinorrhea, sneezing, trouble swallowing and sinus pressure.  Eyes: Negative for photophobia and visual disturbance.  Respiratory: Negative for cough, chest tightness, shortness of  breath, wheezing and stridor.  Cardiovascular: Negative for chest pain, palpitations and leg swelling.  Gastrointestinal: Negative for nausea, vomiting, abdominal pain, diarrhea, constipation, blood in stool, abdominal distention and anal bleeding.  Genitourinary: Negative for dysuria, hematuria, flank pain and difficulty urinating.  Musculoskeletal: Negative for myalgias, back pain, joint swelling, arthralgias and gait problem.  Skin: Negative for color change, pallor, rash and wound.  Neurological: Negative for dizziness, tremors, weakness and light-headedness.  Hematological: Negative for adenopathy. Does not bruise/bleed easily.  Psychiatric/Behavioral: Negative for behavioral problems, confusion, sleep disturbance, dysphoric mood, decreased concentration and agitation.     OBJECTIVE: Temp:  [97.9 F (36.6 C)-102.5 F (39.2 C)] 97.9 F (36.6 C) (02/13 1342) Pulse Rate:  [69-92] 69 (02/13 1342) Resp:  [16-20] 20 (02/13 1342) BP: (114-135)/(48-60) 135/60 mmHg (02/13 1342) SpO2:  [94 %-100 %] 100 % (02/13 1342) Physical Exam  Constitutional:  oriented to person, place, and time. appears well-developed and well-nourished. No distress.  HENT:  Mouth/Throat: Oropharynx is clear and moist. No oropharyngeal exudate.  Cardiovascular: Normal rate, regular rhythm and normal heart sounds. Exam reveals no gallop and no friction rub.  No murmur heard.  Pulmonary/Chest: Effort normal and breath sounds normal. No respiratory distress.  has no wheezes.  Abdominal: Soft. Bowel sounds are normal.  exhibits no distension. There is no tenderness.  Lymphadenopathy: no cervical adenopathy.  Neurological: alert and oriented to person, place, and time.  Skin: Skin is warm and dry. No rash noted. No erythema. Nail defect. Psychiatric: a normal mood and affect.  behavior is normal.   LABS: Results for orders placed during the hospital encounter of 12/22/13 (from the past 48 hour(s))  GLUCOSE, CAPILLARY      Status: Abnormal   Collection Time    12/29/13  4:43 PM      Result Value Ref Range   Glucose-Capillary 148 (*) 70 - 99 mg/dL  GLUCOSE, CAPILLARY     Status: Abnormal   Collection Time    12/29/13  9:27 PM      Result Value Ref Range   Glucose-Capillary 285 (*) 70 - 99 mg/dL   Comment 1 Notify RN     Comment 2 Documented in Chart    CBC     Status: Abnormal   Collection Time    12/30/13  5:09 AM      Result Value Ref Range   WBC 4.9  4.0 - 10.5 K/uL   RBC 3.07 (*) 3.87 - 5.11 MIL/uL   Hemoglobin 8.6 (*) 12.0 - 15.0 g/dL   HCT 27.1 (*) 36.0 - 46.0 %   MCV 88.3  78.0 - 100.0 fL   MCH 28.0  26.0 - 34.0 pg   MCHC 31.7  30.0 - 36.0 g/dL  RDW 15.0  11.5 - 15.5 %   Platelets 203  150 - 400 K/uL  BASIC METABOLIC PANEL     Status: Abnormal   Collection Time    12/30/13  5:09 AM      Result Value Ref Range   Sodium 137  137 - 147 mEq/L   Potassium 3.2 (*) 3.7 - 5.3 mEq/L   Chloride 102  96 - 112 mEq/L   CO2 23  19 - 32 mEq/L   Glucose, Bld 217 (*) 70 - 99 mg/dL   BUN 8  6 - 23 mg/dL   Creatinine, Ser 0.76  0.50 - 1.10 mg/dL   Calcium 8.3 (*) 8.4 - 10.5 mg/dL   GFR calc non Af Amer 90 (*) >90 mL/min   GFR calc Af Amer >90  >90 mL/min   Comment: (NOTE)     The eGFR has been calculated using the CKD EPI equation.     This calculation has not been validated in all clinical situations.     eGFR's persistently <90 mL/min signify possible Chronic Kidney     Disease.  GLUCOSE, CAPILLARY     Status: Abnormal   Collection Time    12/30/13  7:03 AM      Result Value Ref Range   Glucose-Capillary 170 (*) 70 - 99 mg/dL  GLUCOSE, CAPILLARY     Status: Abnormal   Collection Time    12/30/13 11:53 AM      Result Value Ref Range   Glucose-Capillary 208 (*) 70 - 99 mg/dL  GLUCOSE, CAPILLARY     Status: None   Collection Time    12/30/13  4:46 PM      Result Value Ref Range   Glucose-Capillary 70  70 - 99 mg/dL  GLUCOSE, CAPILLARY     Status: Abnormal   Collection Time    12/30/13   9:26 PM      Result Value Ref Range   Glucose-Capillary 234 (*) 70 - 99 mg/dL   Comment 1 Notify RN     Comment 2 Documented in Chart    CBC     Status: Abnormal   Collection Time    12/31/13  5:57 AM      Result Value Ref Range   WBC 16.4 (*) 4.0 - 10.5 K/uL   RBC 2.80 (*) 3.87 - 5.11 MIL/uL   Hemoglobin 7.9 (*) 12.0 - 15.0 g/dL   HCT 24.7 (*) 36.0 - 46.0 %   MCV 88.2  78.0 - 100.0 fL   MCH 28.2  26.0 - 34.0 pg   MCHC 32.0  30.0 - 36.0 g/dL   RDW 15.4  11.5 - 15.5 %   Platelets 119 (*) 150 - 400 K/uL   Comment: REPEATED TO VERIFY     DELTA CHECK NOTED     CONSISTENT WITH PREVIOUS RESULT  BASIC METABOLIC PANEL     Status: Abnormal   Collection Time    12/31/13  5:57 AM      Result Value Ref Range   Sodium 137  137 - 147 mEq/L   Potassium 3.6 (*) 3.7 - 5.3 mEq/L   Chloride 103  96 - 112 mEq/L   CO2 20  19 - 32 mEq/L   Glucose, Bld 147 (*) 70 - 99 mg/dL   BUN 12  6 - 23 mg/dL   Creatinine, Ser 0.87  0.50 - 1.10 mg/dL   Calcium 8.3 (*) 8.4 - 10.5 mg/dL   GFR calc non Af Wyvonnia Lora  71 (*) >90 mL/min   GFR calc Af Amer 82 (*) >90 mL/min   Comment: (NOTE)     The eGFR has been calculated using the CKD EPI equation.     This calculation has not been validated in all clinical situations.     eGFR's persistently <90 mL/min signify possible Chronic Kidney     Disease.  GLUCOSE, CAPILLARY     Status: Abnormal   Collection Time    12/31/13  7:08 AM      Result Value Ref Range   Glucose-Capillary 166 (*) 70 - 99 mg/dL   Comment 1 Notify RN     Comment 2 Documented in Chart    URINALYSIS, ROUTINE W REFLEX MICROSCOPIC     Status: Abnormal   Collection Time    12/31/13 10:00 AM      Result Value Ref Range   Color, Urine YELLOW  YELLOW   APPearance CLOUDY (*) CLEAR   Specific Gravity, Urine 1.021  1.005 - 1.030   pH 6.0  5.0 - 8.0   Glucose, UA 500 (*) NEGATIVE mg/dL   Hgb urine dipstick MODERATE (*) NEGATIVE   Bilirubin Urine NEGATIVE  NEGATIVE   Ketones, ur NEGATIVE  NEGATIVE  mg/dL   Protein, ur 30 (*) NEGATIVE mg/dL   Urobilinogen, UA 0.2  0.0 - 1.0 mg/dL   Nitrite NEGATIVE  NEGATIVE   Leukocytes, UA NEGATIVE  NEGATIVE  URINE MICROSCOPIC-ADD ON     Status: Abnormal   Collection Time    12/31/13 10:00 AM      Result Value Ref Range   Squamous Epithelial / LPF FEW (*) RARE   WBC, UA 0-2  <3 WBC/hpf   RBC / HPF 3-6  <3 RBC/hpf  GLUCOSE, CAPILLARY     Status: Abnormal   Collection Time    12/31/13 11:56 AM      Result Value Ref Range   Glucose-Capillary 220 (*) 70 - 99 mg/dL   Comment 1 Notify RN     Comment 2 Documented in Chart      MICRO: 2/13 blood cx : pending 2/9 blood cx: NGTD 2/4 blood cx: strep viridans  IMAGING: Dg Chest 2 View  12/31/2013   CLINICAL DATA:  Current history of pancreatic cancer, now presenting with cough, chills, fever and shortness of breath.  EXAM: CHEST  2 VIEW  COMPARISON:  DG CHEST 1V PORT dated 12/22/2013; DG ABD ACUTE W/CHEST dated 11/01/2013; NM PET IMAGE INITIAL (PI) SKULL BASE TO THIGH dated 06/09/2013;  FINDINGS: Cardiac silhouette mildly enlarged but stable. Thoracic aorta mildly tortuous and atherosclerotic, unchanged. Hilar and mediastinal contours otherwise unremarkable. Linear scarring in the lingula, unchanged. Lungs otherwise clear. No localized airspace consolidation. No pleural effusions. No pneumothorax. Normal pulmonary vascularity. Degenerative changes throughout the thoracic spine. Right arm venous catheter tip projects over the expected location of the axillary vein.  IMPRESSION: 1. Stable linear scarring in the lingula. No acute cardiopulmonary disease. 2. Stable mild cardiomegaly without pulmonary edema. 3. Right arm venous catheter tip projects over the expected location of the right axillary vein.   Electronically Signed   By: Evangeline Dakin M.D.   On: 12/31/2013 10:57   Assessment/Plan: 58yo F with pancreatic ca who presents with fevers, ams found to have strep viridans bacteremia on admit 2/4 but then had  repeat isolated high fevers and leukocytosis on 2/13, day #10 of antibiotics  - CT shows possible thrombus ,and may need to consider treating for infective thrombus and prolonged antibiotic  therapy to 4-6 wks. TEE ruled on endocarditis. Would defer to heme if she needs to be anticoagulated  - continue her on ceftriaxone 2gm IV daily (for presumed intermediate susceptible strep viridans)and if she has repeat fevers, or clinically unstable would broaden back  to vancomycin and pip-tazo. - await infectious work up from blood cx and urine cx to guide changes in the antibiotics. She is clinically stable at this time and don't feel the need to change antibiotics yet. - consider check cdifficile if she has loose stools   Dr Megan Salon to provide further recs over the weekend  [facility] [phone]

## 2014-01-01 DIAGNOSIS — A491 Streptococcal infection, unspecified site: Secondary | ICD-10-CM

## 2014-01-01 LAB — CBC
HCT: 25.7 % — ABNORMAL LOW (ref 36.0–46.0)
HEMOGLOBIN: 8.2 g/dL — AB (ref 12.0–15.0)
MCH: 28.1 pg (ref 26.0–34.0)
MCHC: 31.9 g/dL (ref 30.0–36.0)
MCV: 88 fL (ref 78.0–100.0)
Platelets: 221 10*3/uL (ref 150–400)
RBC: 2.92 MIL/uL — AB (ref 3.87–5.11)
RDW: 15.4 % (ref 11.5–15.5)
WBC: 6.4 10*3/uL (ref 4.0–10.5)

## 2014-01-01 LAB — GLUCOSE, CAPILLARY
GLUCOSE-CAPILLARY: 120 mg/dL — AB (ref 70–99)
GLUCOSE-CAPILLARY: 190 mg/dL — AB (ref 70–99)
Glucose-Capillary: 141 mg/dL — ABNORMAL HIGH (ref 70–99)

## 2014-01-01 LAB — BASIC METABOLIC PANEL
BUN: 17 mg/dL (ref 6–23)
CO2: 22 meq/L (ref 19–32)
Calcium: 9 mg/dL (ref 8.4–10.5)
Chloride: 102 mEq/L (ref 96–112)
Creatinine, Ser: 0.76 mg/dL (ref 0.50–1.10)
GFR calc Af Amer: >90 mL/min (ref >90)
GFR, EST NON AFRICAN AMERICAN: 90 mL/min — AB (ref >90)
GLUCOSE: 149 mg/dL — AB (ref 70–99)
POTASSIUM: 3.4 meq/L — AB (ref 3.7–5.3)
SODIUM: 137 meq/L (ref 137–147)

## 2014-01-01 MED ORDER — POTASSIUM CHLORIDE CRYS ER 20 MEQ PO TBCR
40.0000 meq | EXTENDED_RELEASE_TABLET | Freq: Once | ORAL | Status: AC
  Administered 2014-01-01: 40 meq via ORAL
  Filled 2014-01-01 (×2): qty 2

## 2014-01-01 NOTE — Progress Notes (Signed)
[facility] PROGRESS NOTE  [name] [name] [id number] DOB: 08/30/1956 DOA: 12/22/2013 PCP: Philis Fendt, MD   Assessment/Plan: 1. Sepsis with strep bacteremia She has not spiked any further temperatures since the evening of 12/30/13. Repeat cultures from 12/31/13 are pending and blood cultures from 12/27/13 showing no growth.   1. Transesophageal echocardiogram performed today, no evidence of vegetation 2. I discussed case with Dr. Graylon Good of infectious disease on 12/30/13, we discussed cultures, lab results including a negative TEE. She recommended treating with ceftriaxone for a total of 2 weeks as Midline was placed on 12/30/13 then discontinued because of leaking.  2. DM - Her blood sugars are elevated, presently on 20 units. Will increase her Lantus to 25 units [place] q ha. Continue sliding scale coverage.  3. HTN 1. Blood pressures better controlled as SBP's today 's to 140's. Lisinopril 10mg  PO q daily added on 12/29/13.  4. Pancreatic cancer with biliary occlusion 1. S/p biliary stent about 1-2 mos ago 2. Patent stent on CT abd 5. DVT prophylaxis 1. Heparin subQ 6. LE swelling 1. Suspect venous stasis vs secondary to amlodipine 2. Elevate LE for now  Code Status: Full Disposition Plan: Repeat blood cultures are pending.    Antibiotics:  Vancomycin 12/23/13>>>2/9  Zosyn 12/23/13>>>2/9  Rocephin 2 g IV q 24 hours.   HPI/Subjective: Patient's second set of blood cultures remaining negative. She underwent transesophageal echocardiogram today with no complications. TEE did not show evidence of evidence of vegetation  She has not spiked further temperatures since yesterday. She reports feeling a little better today  Objective: Filed Vitals:   12/31/13 0503 12/31/13 1342 12/31/13 2145 01/01/14 0513  BP: 114/58 135/60 131/61 111/55  Pulse: 71 69 73 55  Temp: 98.6 F (37 C) 97.9 F (36.6 C) 98.5 F (36.9 C) 98.5 F (36.9 C)  TempSrc: Oral Oral Oral Oral  Resp: 16 20 18 16    Height:      Weight:      SpO2: 94% 100% 94% 97%    Intake/Output Summary (Last 24 hours) at 01/01/14 1312 Last data filed at 01/01/14 0900  Gross per 24 hour  Intake    790 ml  Output    975 ml  Net   -185 ml   Filed Weights   12/24/13 0528 12/25/13 0600 12/25/13 2226  Weight: 73.1 kg (161 lb 2.5 oz) 75.7 kg (166 lb 14.2 oz) 77.5 kg (170 lb 13.7 oz)    Exam:   General:  Awake, in mild discomfort, appears anxious  Cardiovascular: tachycardic, s1, s2, no murmurs  Respiratory: normal resp effort, no wheezing or crackles  Abdomen: soft, nondistended  Musculoskeletal: perfused, no clubbing   Data Reviewed: Basic Metabolic Panel:  Recent Labs Lab 12/26/13 0557 12/27/13 0350 12/30/13 0509 12/31/13 0557 01/01/14 0539  NA 138 136* 137 137 137  K 3.2* 3.6* 3.2* 3.6* 3.4*  CL 106 104 102 103 102  CO2 20 20 23 20 22   GLUCOSE 179* 263* 217* 147* 149*  BUN 14 11 8 12 17   CREATININE 1.08 1.03 0.76 0.87 0.76  CALCIUM 8.5 8.2* 8.3* 8.3* 9.0   Liver Function Tests: No results found for this basename: AST, ALT, ALKPHOS, BILITOT, PROT, ALBUMIN,  in the last 168 hours No results found for this basename: LIPASE, AMYLASE,  in the last 168 hours No results found for this basename: AMMONIA,  in the last 168 hours CBC:  Recent Labs Lab 12/26/13 0557 12/27/13 0350 12/30/13 0509 12/31/13 0557 01/01/14 0539  WBC 5.1 4.7 4.9 16.4* 6.4  NEUTROABS 3.9  --   --  15.2*  --   HGB 9.1* 8.3* 8.6* 7.9* 8.2*  HCT 27.4* 25.4* 27.1* 24.7* 25.7*  MCV 85.9 86.7 88.3 88.2 88.0  PLT 114* 115* 203 119* 221   Cardiac Enzymes: No results found for this basename: CKTOTAL, CKMB, CKMBINDEX, TROPONINI,  in the last 168 hours BNP (last 3 results) No results found for this basename: PROBNP,  in the last 8760 hours CBG:  Recent Labs Lab 12/31/13 1156 12/31/13 1722 12/31/13 2140 01/01/14 0755 01/01/14 1200  GLUCAP 220* 236* 227* 141* 120*    Recent Results (from the past 240 hour(s))   CULTURE, BLOOD (ROUTINE X 2)     Status: None   Collection Time    12/22/13  4:20 PM      Result Value Ref Range Status   Specimen Description BLOOD LEFT ARM   Final   Special Requests BOTTLES DRAWN AEROBIC AND ANAEROBIC 3ML   Final   Culture  Setup Time     Final   Value: 12/22/2013 20:43     Performed at [facility]   Culture     Final   Value: VIRIDANS STREPTOCOCCUS     Note: GROWTH INSUFFICIENT FOR SENSITIVITIES     Note: Gram Stain Report Called to,Read Back By and Verified With: TIFFANY BANKS ON 12/24/2013 AT 12:10A BY WILEJ     Performed at [facility]   Report Status 12/28/2013 FINAL   Final  CULTURE, BLOOD (ROUTINE X 2)     Status: None   Collection Time    12/22/13  4:30 PM      Result Value Ref Range Status   Specimen Description BLOOD LEFT FOREARM   Final   Special Requests BOTTLES DRAWN AEROBIC AND ANAEROBIC 3ML   Final   Culture  Setup Time     Final   Value: 12/22/2013 20:44     Performed at [facility]   Culture     Final   Value: VIRIDANS STREPTOCOCCUS     Note: GROWTH INSUFFICIENT FOR SENSITIVITIES     Note: Gram Stain Report Called to,Read Back By and Verified With: Darrin Nipper ON 12/23/2013 AT 8:33P BY WILEJ     Performed at [facility]   Report Status 12/28/2013 FINAL   Final  URINE CULTURE     Status: None   Collection Time    12/22/13  7:04 PM      Result Value Ref Range Status   Specimen Description URINE, CATHETERIZED   Final   Special Requests NONE   Final   Culture  Setup Time     Final   Value: 12/22/2013 21:38     Performed at [place]     Final   Value: NO GROWTH     Performed at [facility]   Culture     Final   Value: NO GROWTH     Performed at [facility]   Report Status 12/23/2013 FINAL   Final  CLOSTRIDIUM DIFFICILE BY PCR     Status: None   Collection Time    12/23/13 12:19 AM      Result Value Ref Range Status   C difficile by pcr NEGATIVE   NEGATIVE Final   Comment: Performed at [place]     Status: None   Collection Time    12/23/13 12:19 AM  Result Value Ref Range Status   Specimen Description STOOL   Final   Special Requests NONE   Final   Culture     Final   Value: NO SALMONELLA, SHIGELLA, CAMPYLOBACTER, YERSINIA, OR E.COLI [id number] ISOLATED     Performed at [facility]   Report Status 12/26/2013 FINAL   Final  MRSA PCR SCREENING     Status: None   Collection Time    12/23/13  1:20 PM      Result Value Ref Range Status   MRSA by PCR NEGATIVE  NEGATIVE Final   Comment:            The GeneXpert MRSA Assay (FDA     approved for NASAL specimens     only), is one component of a     comprehensive MRSA colonization     surveillance program. It is not     intended to diagnose MRSA     infection nor to guide or     monitor treatment for     MRSA infections.  RAPID STREP SCREEN     Status: None   Collection Time    12/26/13 11:22 AM      Result Value Ref Range Status   Streptococcus, Group A Screen (Direct) NEGATIVE  NEGATIVE Final   Comment: (NOTE)     A Rapid Antigen test may result negative if the antigen level in the     sample is below the detection level of this test. The FDA has not     cleared this test as a stand-alone test therefore the rapid antigen     negative result has reflexed to a Group A Strep culture.  CULTURE, GROUP A STREP     Status: None   Collection Time    12/26/13 11:22 AM      Result Value Ref Range Status   Specimen Description THROAT   Final   Special Requests NONE   Final   Culture     Final   Value: No Beta Hemolytic Streptococci Isolated     Performed at [facility]   Report Status 12/28/2013 FINAL   Final  CULTURE, BLOOD (ROUTINE X 2)     Status: None   Collection Time    12/27/13 11:20 AM      Result Value Ref Range Status   Specimen Description BLOOD RIGHT ARM   Final   Special Requests BOTTLES DRAWN AEROBIC AND ANAEROBIC 10  CC   Final   Culture  Setup Time     Final   Value: 12/27/2013 14:30     Performed at [facility]   Culture     Final   Value:        BLOOD CULTURE RECEIVED NO GROWTH TO DATE CULTURE WILL BE HELD FOR 5 DAYS BEFORE ISSUING A FINAL NEGATIVE REPORT     Performed at [facility]   Report Status PENDING   Incomplete  CULTURE, BLOOD (ROUTINE X 2)     Status: None   Collection Time    12/27/13 11:28 AM      Result Value Ref Range Status   Specimen Description BLOOD RIGHT HAND   Final   Special Requests BOTTLES DRAWN AEROBIC AND ANAEROBIC 5 CC EA   Final   Culture  Setup Time     Final   Value: 12/27/2013 14:30     Performed at [facility]   Culture     Final  Value:        BLOOD CULTURE RECEIVED NO GROWTH TO DATE CULTURE WILL BE HELD FOR 5 DAYS BEFORE ISSUING A FINAL NEGATIVE REPORT     Performed at [facility]   Report Status PENDING   Incomplete     Studies: Dg Chest 2 View  12/31/2013   CLINICAL DATA:  Current history of pancreatic cancer, now presenting with cough, chills, fever and shortness of breath.  EXAM: CHEST  2 VIEW  COMPARISON:  DG CHEST 1V PORT dated 12/22/2013; DG ABD ACUTE W/CHEST dated 11/01/2013; NM PET IMAGE INITIAL (PI) SKULL BASE TO THIGH dated 06/09/2013;  FINDINGS: Cardiac silhouette mildly enlarged but stable. Thoracic aorta mildly tortuous and atherosclerotic, unchanged. Hilar and mediastinal contours otherwise unremarkable. Linear scarring in the lingula, unchanged. Lungs otherwise clear. No localized airspace consolidation. No pleural effusions. No pneumothorax. Normal pulmonary vascularity. Degenerative changes throughout the thoracic spine. Right arm venous catheter tip projects over the expected location of the axillary vein.  IMPRESSION: 1. Stable linear scarring in the lingula. No acute cardiopulmonary disease. 2. Stable mild cardiomegaly without pulmonary edema. 3. Right arm venous catheter tip projects over the expected location of  the right axillary vein.   Electronically Signed   By: Evangeline Dakin M.D.   On: 12/31/2013 10:57    Scheduled Meds: . amLODipine  10 mg Oral Daily  . aspirin  81 mg Oral Daily  . cefTRIAXone (ROCEPHIN)  IV  2 g Intravenous Q24H  . gabapentin  300 mg Oral TID  . heparin  5,000 Units Subcutaneous 3 times per day  . insulin aspart  0-9 Units Subcutaneous TID WC  . insulin aspart  5 Units Subcutaneous TID WC  . insulin glargine  20 Units Subcutaneous QHS  . lisinopril  10 mg Oral Daily  . metoprolol tartrate  50 mg Oral BID  . pantoprazole  40 mg Oral BID  . potassium chloride  10 mEq Oral Daily  . simvastatin  10 mg Oral QHS  . sodium chloride  3 mL Intravenous Q12H  . sodium chloride  3 mL Intravenous Q12H  . sucralfate  1 g Oral BID   Continuous Infusions: . sodium chloride 1,000 mL (12/31/13 2230)  . sodium chloride      Principal Problem:   Sepsis Active Problems:   DM (diabetes mellitus)   HTN (hypertension)   Biliary stricture   Pancreatic cancer   Abdominal pain, epigastric   Time spent: 81min   Kelvin Cellar  [facility] Pager [phone]. If 7PM-7AM, please contact night-coverage at [email or web address], password [facility] 01/01/2014, 1:12 PM  LOS: 10 days

## 2014-01-01 NOTE — Progress Notes (Signed)
Patient ID: [name] [name], female   DOB: 1956-07-03, 58 y.o.   MRN: [id number]         [facility] for Infectious Disease    Date of Admission:  12/22/2013    Total days of antibiotics 11        Day 6 ceftriaxone         Principal Problem:   Sepsis Active Problems:   DM (diabetes mellitus)   HTN (hypertension)   Biliary stricture   Pancreatic cancer   Abdominal pain, epigastric   . amLODipine  10 mg Oral Daily  . aspirin  81 mg Oral Daily  . cefTRIAXone (ROCEPHIN)  IV  2 g Intravenous Q24H  . gabapentin  300 mg Oral TID  . heparin  5,000 Units Subcutaneous 3 times per day  . insulin aspart  0-9 Units Subcutaneous TID WC  . insulin aspart  5 Units Subcutaneous TID WC  . insulin glargine  20 Units Subcutaneous QHS  . lisinopril  10 mg Oral Daily  . metoprolol tartrate  50 mg Oral BID  . pantoprazole  40 mg Oral BID  . potassium chloride  10 mEq Oral Daily  . simvastatin  10 mg Oral QHS  . sodium chloride  3 mL Intravenous Q12H  . sodium chloride  3 mL Intravenous Q12H  . sucralfate  1 g Oral BID   Objective: Temp:  [97.9 F (36.6 C)-98.5 F (36.9 C)] 98.5 F (36.9 C) (02/14 0513) Pulse Rate:  [55-73] 55 (02/14 0513) Resp:  [16-20] 16 (02/14 0513) BP: (111-135)/(55-61) 111/55 mmHg (02/14 0513) SpO2:  [94 %-100 %] 97 % (02/14 0513)  Lab Results Lab Results  Component Value Date   WBC 6.4 01/01/2014   HGB 8.2* 01/01/2014   HCT 25.7* 01/01/2014   MCV 88.0 01/01/2014   PLT 221 01/01/2014    Lab Results  Component Value Date   CREATININE 0.76 01/01/2014   BUN 17 01/01/2014   NA 137 01/01/2014   K 3.4* 01/01/2014   CL 102 01/01/2014   CO2 22 01/01/2014    Lab Results  Component Value Date   ALT 64* 12/24/2013   AST 90* 12/24/2013   ALKPHOS 176* 12/24/2013   BILITOT 0.7 12/24/2013      Urinalysis    Component Value Date/Time   COLORURINE YELLOW 12/31/2013 1000   APPEARANCEUR CLOUDY* 12/31/2013 1000   LABSPEC 1.021 12/31/2013 1000   PHURINE 6.0 12/31/2013 1000   GLUCOSEU  500* 12/31/2013 1000   HGBUR MODERATE* 12/31/2013 1000   BILIRUBINUR NEGATIVE 12/31/2013 1000   KETONESUR NEGATIVE 12/31/2013 1000   PROTEINUR 30* 12/31/2013 1000   UROBILINOGEN 0.2 12/31/2013 1000   NITRITE NEGATIVE 12/31/2013 1000   LEUKOCYTESUR NEGATIVE 12/31/2013 1000   0-2 WBC   Microbiology: Recent Results (from the past 240 hour(s))  CULTURE, BLOOD (ROUTINE X 2)     Status: None   Collection Time    12/22/13  4:20 PM      Result Value Ref Range Status   Specimen Description BLOOD LEFT ARM   Final   Special Requests BOTTLES DRAWN AEROBIC AND ANAEROBIC 3ML   Final   Culture  Setup Time     Final   Value: 12/22/2013 20:43     Performed at [facility]   Culture     Final   Value: VIRIDANS STREPTOCOCCUS     Note: GROWTH INSUFFICIENT FOR SENSITIVITIES     Note: Gram Stain Report Called to,Read Back By and Verified With:  TIFFANY BANKS ON 12/24/2013 AT 12:10A BY Dennard Nip     Performed at [facility]   Report Status 12/28/2013 FINAL   Final  CULTURE, BLOOD (ROUTINE X 2)     Status: None   Collection Time    12/22/13  4:30 PM      Result Value Ref Range Status   Specimen Description BLOOD LEFT FOREARM   Final   Special Requests BOTTLES DRAWN AEROBIC AND ANAEROBIC 3ML   Final   Culture  Setup Time     Final   Value: 12/22/2013 20:44     Performed at [facility]   Culture     Final   Value: VIRIDANS STREPTOCOCCUS     Note: GROWTH INSUFFICIENT FOR SENSITIVITIES     Note: Gram Stain Report Called to,Read Back By and Verified With: Darrin Nipper ON 12/23/2013 AT 8:33P BY Dennard Nip     Performed at [facility]   Report Status 12/28/2013 FINAL   Final  URINE CULTURE     Status: None   Collection Time    12/22/13  7:04 PM      Result Value Ref Range Status   Specimen Description URINE, CATHETERIZED   Final   Special Requests NONE   Final   Culture  Setup Time     Final   Value: 12/22/2013 21:38     Performed at [place]     Final    Value: NO GROWTH     Performed at [facility]   Culture     Final   Value: NO GROWTH     Performed at [facility]   Report Status 12/23/2013 FINAL   Final  CLOSTRIDIUM DIFFICILE BY PCR     Status: None   Collection Time    12/23/13 12:19 AM      Result Value Ref Range Status   C difficile by pcr NEGATIVE  NEGATIVE Final   Comment: Performed at [place]     Status: None   Collection Time    12/23/13 12:19 AM      Result Value Ref Range Status   Specimen Description STOOL   Final   Special Requests NONE   Final   Culture     Final   Value: NO SALMONELLA, SHIGELLA, CAMPYLOBACTER, YERSINIA, OR E.COLI [id number]:[id number] ISOLATED     Performed at [facility]   Report Status 12/26/2013 FINAL   Final  MRSA PCR SCREENING     Status: None   Collection Time    12/23/13  1:20 PM      Result Value Ref Range Status   MRSA by PCR NEGATIVE  NEGATIVE Final   Comment:            The GeneXpert MRSA Assay (FDA     approved for NASAL specimens     only), is one component of a     comprehensive MRSA colonization     surveillance program. It is not     intended to diagnose MRSA     infection nor to guide or     monitor treatment for     MRSA infections.  RAPID STREP SCREEN     Status: None   Collection Time    12/26/13 11:22 AM      Result Value Ref Range Status   Streptococcus, Group A Screen (Direct) NEGATIVE  NEGATIVE Final   Comment: (NOTE)  A Rapid Antigen test may result negative if the antigen level in the     sample is below the detection level of this test. The FDA has not     cleared this test as a stand-alone test therefore the rapid antigen     negative result has reflexed to a Group A Strep culture.  CULTURE, GROUP A STREP     Status: None   Collection Time    12/26/13 11:22 AM      Result Value Ref Range Status   Specimen Description THROAT   Final   Special Requests NONE   Final   Culture     Final   Value: No Beta  Hemolytic Streptococci Isolated     Performed at [facility]   Report Status 12/28/2013 FINAL   Final  CULTURE, BLOOD (ROUTINE X 2)     Status: None   Collection Time    12/27/13 11:20 AM      Result Value Ref Range Status   Specimen Description BLOOD RIGHT ARM   Final   Special Requests BOTTLES DRAWN AEROBIC AND ANAEROBIC 10 CC   Final   Culture  Setup Time     Final   Value: 12/27/2013 14:30     Performed at [facility]   Culture     Final   Value:        BLOOD CULTURE RECEIVED NO GROWTH TO DATE CULTURE WILL BE HELD FOR 5 DAYS BEFORE ISSUING A FINAL NEGATIVE REPORT     Performed at [facility]   Report Status PENDING   Incomplete  CULTURE, BLOOD (ROUTINE X 2)     Status: None   Collection Time    12/27/13 11:28 AM      Result Value Ref Range Status   Specimen Description BLOOD RIGHT HAND   Final   Special Requests BOTTLES DRAWN AEROBIC AND ANAEROBIC 5 CC EA   Final   Culture  Setup Time     Final   Value: 12/27/2013 14:30     Performed at [facility]   Culture     Final   Value:        BLOOD CULTURE RECEIVED NO GROWTH TO DATE CULTURE WILL BE HELD FOR 5 DAYS BEFORE ISSUING A FINAL NEGATIVE REPORT     Performed at [facility]   Report Status PENDING   Incomplete    Studies/Results: Dg Chest 2 View  12/31/2013   CLINICAL DATA:  Current history of pancreatic cancer, now presenting with cough, chills, fever and shortness of breath.  EXAM: CHEST  2 VIEW  COMPARISON:  DG CHEST 1V PORT dated 12/22/2013; DG ABD ACUTE W/CHEST dated 11/01/2013; NM PET IMAGE INITIAL (PI) SKULL BASE TO THIGH dated 06/09/2013;  FINDINGS: Cardiac silhouette mildly enlarged but stable. Thoracic aorta mildly tortuous and atherosclerotic, unchanged. Hilar and mediastinal contours otherwise unremarkable. Linear scarring in the lingula, unchanged. Lungs otherwise clear. No localized airspace consolidation. No pleural effusions. No pneumothorax. Normal pulmonary vascularity.  Degenerative changes throughout the thoracic spine. Right arm venous catheter tip projects over the expected location of the axillary vein.  IMPRESSION: 1. Stable linear scarring in the lingula. No acute cardiopulmonary disease. 2. Stable mild cardiomegaly without pulmonary edema. 3. Right arm venous catheter tip projects over the expected location of the right axillary vein.   Electronically Signed   By: Evangeline Dakin M.D.   On: 12/31/2013 10:57    Assessment: She has had no further  fever and no evidence of new infection other than in the shoulder and strep bacteremia.  Plan: 1. Continue ceftriaxone pending final cultures  Michel Bickers, MD [facility] for Infectious [place] [phone] pager   [phone] cell 01/01/2014, 11:12 AM

## 2014-01-02 LAB — GLUCOSE, CAPILLARY
GLUCOSE-CAPILLARY: 108 mg/dL — AB (ref 70–99)
GLUCOSE-CAPILLARY: 190 mg/dL — AB (ref 70–99)
GLUCOSE-CAPILLARY: 254 mg/dL — AB (ref 70–99)
Glucose-Capillary: 146 mg/dL — ABNORMAL HIGH (ref 70–99)

## 2014-01-02 LAB — CULTURE, BLOOD (ROUTINE X 2)
CULTURE: NO GROWTH
Culture: NO GROWTH

## 2014-01-02 NOTE — Progress Notes (Signed)
[facility] PROGRESS NOTE  [name] [name] XLK:[id number] DOB: 1955/12/24 DOA: 12/22/2013 PCP: Philis Fendt, MD   Assessment/Plan: 1. Sepsis with strep bacteremia She has not spiked any further temperatures since the evening of 12/30/13. Repeat cultures from 12/31/13 and blood cultures from 12/27/13 showing no growth thus far, as she has not spiked further temperatures.   1. Transesophageal echocardiogram showed no evidence of vegetation 2. I discussed case with Dr. Graylon Good of infectious disease on 12/30/13, we discussed cultures, lab results including a negative TEE. She recommended treating with ceftriaxone for a total of 2 weeks as Midline was placed on 12/30/13 then discontinued because of leaking.  2. DM - Her blood sugars are elevated, presently on 20 units. Will increase her Lantus to 25 units [place] q ha. Continue sliding scale coverage.  3. HTN 1. Blood pressures better controlled as SBP's today 's to 140's. Lisinopril 10mg  PO q daily added on 12/29/13.  4. Pancreatic cancer with biliary occlusion 1. S/p biliary stent about 1-2 mos ago 2. Patent stent on CT abd 5. DVT prophylaxis 1. Heparin subQ 6. LE swelling 1. Suspect venous stasis vs secondary to amlodipine 2. Elevate LE for now  Code Status: Full Disposition Plan: Repeat blood cultures are pending.    Antibiotics:  Vancomycin 12/23/13>>>2/9  Zosyn 12/23/13>>>2/9  Rocephin 2 g IV q 24 hours.   HPI/Subjective: Patient's second set of blood cultures remaining negative. She underwent transesophageal echocardiogram today with no complications. TEE did not show evidence of evidence of vegetation  Patient was ambulated down the hallway, remains afebrile, tolerating PO intake  Objective: Filed Vitals:   01/01/14 1349 01/01/14 2040 01/02/14 0604 01/02/14 1416  BP: 147/66 159/69 143/93 132/70  Pulse: 59 78 72 70  Temp: 97.4 F (36.3 C) 98.9 F (37.2 C) 98.7 F (37.1 C) 98.2 F (36.8 C)  TempSrc: Oral Oral Oral Oral  Resp: 18  18 18 16   Height:      Weight:      SpO2: 100% 100% 99% 97%    Intake/Output Summary (Last 24 hours) at 01/02/14 1708 Last data filed at 01/02/14 1600  Gross per 24 hour  Intake   1560 ml  Output   1550 ml  Net     10 ml   Filed Weights   12/24/13 0528 12/25/13 0600 12/25/13 2226  Weight: 73.1 kg (161 lb 2.5 oz) 75.7 kg (166 lb 14.2 oz) 77.5 kg (170 lb 13.7 oz)    Exam:   General:  Awake, in mild discomfort, appears anxious  Cardiovascular: tachycardic, s1, s2, no murmurs  Respiratory: normal resp effort, no wheezing or crackles  Abdomen: soft, nondistended  Musculoskeletal: perfused, no clubbing   Data Reviewed: Basic Metabolic Panel:  Recent Labs Lab 12/27/13 0350 12/30/13 0509 12/31/13 0557 01/01/14 0539  NA 136* 137 137 137  K 3.6* 3.2* 3.6* 3.4*  CL 104 102 103 102  CO2 20 23 20 22   GLUCOSE 263* 217* 147* 149*  BUN 11 8 12 17   CREATININE 1.03 0.76 0.87 0.76  CALCIUM 8.2* 8.3* 8.3* 9.0   Liver Function Tests: No results found for this basename: AST, ALT, ALKPHOS, BILITOT, PROT, ALBUMIN,  in the last 168 hours No results found for this basename: LIPASE, AMYLASE,  in the last 168 hours No results found for this basename: AMMONIA,  in the last 168 hours CBC:  Recent Labs Lab 12/27/13 0350 12/30/13 0509 12/31/13 0557 01/01/14 0539  WBC 4.7 4.9 16.4* 6.4  NEUTROABS  --   --  15.2*  --   HGB 8.3* 8.6* 7.9* 8.2*  HCT 25.4* 27.1* 24.7* 25.7*  MCV 86.7 88.3 88.2 88.0  PLT 115* 203 119* 221   Cardiac Enzymes: No results found for this basename: CKTOTAL, CKMB, CKMBINDEX, TROPONINI,  in the last 168 hours BNP (last 3 results) No results found for this basename: PROBNP,  in the last 8760 hours CBG:  Recent Labs Lab 01/01/14 1200 01/01/14 2037 01/02/14 0730 01/02/14 1134 01/02/14 1639  GLUCAP 120* 190* 108* 146* 190*    Recent Results (from the past 240 hour(s))  RAPID STREP SCREEN     Status: None   Collection Time    12/26/13 11:22 AM       Result Value Ref Range Status   Streptococcus, Group A Screen (Direct) NEGATIVE  NEGATIVE Final   Comment: (NOTE)     A Rapid Antigen test may result negative if the antigen level in the     sample is below the detection level of this test. The FDA has not     cleared this test as a stand-alone test therefore the rapid antigen     negative result has reflexed to a Group A Strep culture.  CULTURE, GROUP A STREP     Status: None   Collection Time    12/26/13 11:22 AM      Result Value Ref Range Status   Specimen Description THROAT   Final   Special Requests NONE   Final   Culture     Final   Value: No Beta Hemolytic Streptococci Isolated     Performed at [facility]   Report Status 12/28/2013 FINAL   Final  CULTURE, BLOOD (ROUTINE X 2)     Status: None   Collection Time    12/27/13 11:20 AM      Result Value Ref Range Status   Specimen Description BLOOD RIGHT ARM   Final   Special Requests BOTTLES DRAWN AEROBIC AND ANAEROBIC 10 CC   Final   Culture  Setup Time     Final   Value: 12/27/2013 14:30     Performed at [facility]   Culture     Final   Value:        BLOOD CULTURE RECEIVED NO GROWTH TO DATE CULTURE WILL BE HELD FOR 5 DAYS BEFORE ISSUING A FINAL NEGATIVE REPORT     Performed at [facility]   Report Status PENDING   Incomplete  CULTURE, BLOOD (ROUTINE X 2)     Status: None   Collection Time    12/27/13 11:28 AM      Result Value Ref Range Status   Specimen Description BLOOD RIGHT HAND   Final   Special Requests BOTTLES DRAWN AEROBIC AND ANAEROBIC 5 CC EA   Final   Culture  Setup Time     Final   Value: 12/27/2013 14:30     Performed at [facility]   Culture     Final   Value:        BLOOD CULTURE RECEIVED NO GROWTH TO DATE CULTURE WILL BE HELD FOR 5 DAYS BEFORE ISSUING A FINAL NEGATIVE REPORT     Performed at [facility]   Report Status PENDING   Incomplete  CULTURE, BLOOD (ROUTINE X 2)     Status: None   Collection Time     12/31/13  9:34 AM      Result Value Ref Range Status   Specimen Description BLOOD LEFT  ARM   Final   Special Requests BLOOD 5CC   Final   Culture  Setup Time     Final   Value: 12/31/2013 13:29     Performed at [facility]   Culture     Final   Value:        BLOOD CULTURE RECEIVED NO GROWTH TO DATE CULTURE WILL BE HELD FOR 5 DAYS BEFORE ISSUING A FINAL NEGATIVE REPORT     Performed at [facility]   Report Status PENDING   Incomplete  CULTURE, BLOOD (ROUTINE X 2)     Status: None   Collection Time    12/31/13  9:38 AM      Result Value Ref Range Status   Specimen Description BLOOD LEFT HAND   Final   Special Requests BOTTLES DRAWN AEROBIC AND ANAEROBIC 5CC   Final   Culture  Setup Time     Final   Value: 12/31/2013 13:28     Performed at [facility]   Culture     Final   Value:        BLOOD CULTURE RECEIVED NO GROWTH TO DATE CULTURE WILL BE HELD FOR 5 DAYS BEFORE ISSUING A FINAL NEGATIVE REPORT     Performed at [facility]   Report Status PENDING   Incomplete     Studies: No results found.  Scheduled Meds: . amLODipine  10 mg Oral Daily  . aspirin  81 mg Oral Daily  . cefTRIAXone (ROCEPHIN)  IV  2 g Intravenous Q24H  . gabapentin  300 mg Oral TID  . heparin  5,000 Units Subcutaneous 3 times per day  . insulin aspart  0-9 Units Subcutaneous TID WC  . insulin aspart  5 Units Subcutaneous TID WC  . insulin glargine  20 Units Subcutaneous QHS  . lisinopril  10 mg Oral Daily  . metoprolol tartrate  50 mg Oral BID  . pantoprazole  40 mg Oral BID  . potassium chloride  10 mEq Oral Daily  . simvastatin  10 mg Oral QHS  . sodium chloride  3 mL Intravenous Q12H  . sodium chloride  3 mL Intravenous Q12H  . sucralfate  1 g Oral BID   Continuous Infusions: . sodium chloride 1,000 mL (01/01/14 2020)  . sodium chloride      Principal Problem:   Sepsis Active Problems:   DM (diabetes mellitus)   HTN (hypertension)   Biliary stricture    Pancreatic cancer   Abdominal pain, epigastric   Time spent: 35 min   Doyt Castellana  [facility] Pager [phone]. If 7PM-7AM, please contact night-coverage at [email or web address], password [facility] 01/02/2014, 5:08 PM  LOS: 11 days

## 2014-01-02 NOTE — Progress Notes (Signed)
Pharmacy: Brief Abx Note  D#7 Rocephin for strep bacteremia.   Renal function stable. No dose adjustments needed.   2/4 >> Vanc >> 2/9 2/4 >> Zosyn >> 2/9 2/9 >> Ceftriaxone >>   Pharmacy will sign off on notes and f/u final cultures from repeat blood cultures on 2/13  Shakita Keir, Gaye Alken PharmD Pager #: [phone] 3:16 PM 01/02/2014

## 2014-01-03 ENCOUNTER — Ambulatory Visit (HOSPITAL_COMMUNITY): Payer: Medicaid Other

## 2014-01-03 ENCOUNTER — Encounter (HOSPITAL_COMMUNITY): Payer: Self-pay | Admitting: Radiology

## 2014-01-03 LAB — GLUCOSE, CAPILLARY
Glucose-Capillary: 121 mg/dL — ABNORMAL HIGH (ref 70–99)
Glucose-Capillary: 125 mg/dL — ABNORMAL HIGH (ref 70–99)
Glucose-Capillary: 97 mg/dL (ref 70–99)
Glucose-Capillary: 276 mg/dL — ABNORMAL HIGH (ref 70–99)

## 2014-01-03 LAB — CBC
HCT: 26 % — ABNORMAL LOW (ref 36.0–46.0)
HEMOGLOBIN: 8.4 g/dL — AB (ref 12.0–15.0)
MCH: 28.1 pg (ref 26.0–34.0)
MCHC: 32.3 g/dL (ref 30.0–36.0)
MCV: 87 fL (ref 78.0–100.0)
PLATELETS: 260 10*3/uL (ref 150–400)
RBC: 2.99 MIL/uL — ABNORMAL LOW (ref 3.87–5.11)
RDW: 15.2 % (ref 11.5–15.5)
WBC: 6.2 10*3/uL (ref 4.0–10.5)

## 2014-01-03 LAB — BASIC METABOLIC PANEL
BUN: 15 mg/dL (ref 6–23)
CO2: 23 mEq/L (ref 19–32)
Calcium: 8.8 mg/dL (ref 8.4–10.5)
Chloride: 105 mEq/L (ref 96–112)
Creatinine, Ser: 0.77 mg/dL (ref 0.50–1.10)
GFR, EST NON AFRICAN AMERICAN: 89 mL/min — AB (ref >90)
GLUCOSE: 74 mg/dL (ref 70–99)
POTASSIUM: 3.5 meq/L — AB (ref 3.7–5.3)
SODIUM: 140 meq/L (ref 137–147)

## 2014-01-03 MED ORDER — LORAZEPAM 2 MG/ML IJ SOLN
1.0000 mg | Freq: Once | INTRAMUSCULAR | Status: AC
  Administered 2014-01-03: 1 mg via INTRAVENOUS
  Filled 2014-01-03: qty 1

## 2014-01-03 MED ORDER — IOHEXOL 300 MG/ML  SOLN
100.0000 mL | Freq: Once | INTRAMUSCULAR | Status: AC | PRN
  Administered 2014-01-03: 100 mL via INTRAVENOUS

## 2014-01-03 MED ORDER — SODIUM CHLORIDE 0.9 % IJ SOLN
10.0000 mL | INTRAMUSCULAR | Status: DC | PRN
  Administered 2014-01-05: 10 mL

## 2014-01-03 MED ORDER — IOHEXOL 300 MG/ML  SOLN
25.0000 mL | INTRAMUSCULAR | Status: AC
  Administered 2014-01-03: 50 mL via ORAL
  Administered 2014-01-03: 25 mL via ORAL

## 2014-01-03 NOTE — Progress Notes (Signed)
Peripherally Inserted Central Catheter/Midline Placement  The IV Nurse has discussed with the patient and/or persons authorized to consent for the patient, the purpose of this procedure and the potential benefits and risks involved with this procedure.  The benefits include less needle sticks, lab draws from the catheter and patient may be discharged home with the catheter.  Risks include, but not limited to, infection, bleeding, blood clot (thrombus formation), and puncture of an artery; nerve damage and irregular heat beat.  Alternatives to this procedure were also discussed.  PICC/Midline Placement Documentation        [name] [name] 01/03/2014, 1:42 PM

## 2014-01-03 NOTE — Progress Notes (Signed)
Patient ID: [name] [name], female   DOB: 1955/12/09, 58 y.o.   MRN: [id number]         [facility] for Infectious Disease    Date of Admission:  12/22/2013    Total days of antibiotics 13        Day 8 ceftriaxone         Principal Problem:   Sepsis Active Problems:   DM (diabetes mellitus)   HTN (hypertension)   Biliary stricture   Pancreatic cancer   Abdominal pain, epigastric   . amLODipine  10 mg Oral Daily  . aspirin  81 mg Oral Daily  . cefTRIAXone (ROCEPHIN)  IV  2 g Intravenous Q24H  . gabapentin  300 mg Oral TID  . heparin  5,000 Units Subcutaneous 3 times per day  . insulin aspart  0-9 Units Subcutaneous TID WC  . insulin aspart  5 Units Subcutaneous TID WC  . insulin glargine  20 Units Subcutaneous QHS  . iohexol  25 mL Oral Q1 Hr x 2  . lisinopril  10 mg Oral Daily  . metoprolol tartrate  50 mg Oral BID  . pantoprazole  40 mg Oral BID  . potassium chloride  10 mEq Oral Daily  . simvastatin  10 mg Oral QHS  . sodium chloride  3 mL Intravenous Q12H  . sodium chloride  3 mL Intravenous Q12H  . sucralfate  1 g Oral BID  Subjective: afebrile, picc line placed  Objective: Temp:  [98.2 F (36.8 C)-98.3 F (36.8 C)] 98.3 F (36.8 C) (02/16 0530) Pulse Rate:  [67-78] 67 (02/16 0530) Resp:  [16] 16 (02/16 0530) BP: (132-147)/(67-70) 135/67 mmHg (02/16 0530) SpO2:  [97 %-100 %] 99 % (02/16 0530) Physical Exam  Constitutional:  oriented to person, place, and time. appears well-developed and well-nourished. No distress.  HENT:  Mouth/Throat: Oropharynx is clear and moist. No oropharyngeal exudate.  Cardiovascular: Normal rate, regular rhythm and normal heart sounds. Exam reveals no gallop and no friction rub.  No murmur heard.  Pulmonary/Chest: Effort normal and breath sounds normal. No respiratory distress.  has no wheezes.  Abdominal: Soft. Bowel sounds are normal.  exhibits no distension. There is no tenderness.  Lymphadenopathy: no cervical adenopathy.    Neurological: alert and oriented to person, place, and time.  Skin: Skin is warm and dry. No rash noted. No erythema.    Lab Results Lab Results  Component Value Date   WBC 6.2 01/03/2014   HGB 8.4* 01/03/2014   HCT 26.0* 01/03/2014   MCV 87.0 01/03/2014   PLT 260 01/03/2014    Lab Results  Component Value Date   CREATININE 0.77 01/03/2014   BUN 15 01/03/2014   NA 140 01/03/2014   K 3.5* 01/03/2014   CL 105 01/03/2014   CO2 23 01/03/2014    Lab Results  Component Value Date   ALT 64* 12/24/2013   AST 90* 12/24/2013   ALKPHOS 176* 12/24/2013   BILITOT 0.7 12/24/2013      Urinalysis    Component Value Date/Time   COLORURINE YELLOW 12/31/2013 1000   APPEARANCEUR CLOUDY* 12/31/2013 1000   LABSPEC 1.021 12/31/2013 1000   PHURINE 6.0 12/31/2013 1000   GLUCOSEU 500* 12/31/2013 1000   HGBUR MODERATE* 12/31/2013 1000   BILIRUBINUR NEGATIVE 12/31/2013 1000   KETONESUR NEGATIVE 12/31/2013 1000   PROTEINUR 30* 12/31/2013 1000   UROBILINOGEN 0.2 12/31/2013 1000   NITRITE NEGATIVE 12/31/2013 1000   LEUKOCYTESUR NEGATIVE 12/31/2013 1000   0-2 WBC  Microbiology: Recent Results (from the past 240 hour(s))  RAPID STREP SCREEN     Status: None   Collection Time    12/26/13 11:22 AM      Result Value Ref Range Status   Streptococcus, Group A Screen (Direct) NEGATIVE  NEGATIVE Final   Comment: (NOTE)     A Rapid Antigen test may result negative if the antigen level in the     sample is below the detection level of this test. The FDA has not     cleared this test as a stand-alone test therefore the rapid antigen     negative result has reflexed to a Group A Strep culture.  CULTURE, GROUP A STREP     Status: None   Collection Time    12/26/13 11:22 AM      Result Value Ref Range Status   Specimen Description THROAT   Final   Special Requests NONE   Final   Culture     Final   Value: No Beta Hemolytic Streptococci Isolated     Performed at [facility]   Report Status 12/28/2013 FINAL    Final  CULTURE, BLOOD (ROUTINE X 2)     Status: None   Collection Time    12/27/13 11:20 AM      Result Value Ref Range Status   Specimen Description BLOOD RIGHT ARM   Final   Special Requests BOTTLES DRAWN AEROBIC AND ANAEROBIC 10 CC   Final   Culture  Setup Time     Final   Value: 12/27/2013 14:30     Performed at [facility]   Culture     Final   Value: NO GROWTH 5 DAYS     Performed at [facility]   Report Status 01/02/2014 FINAL   Final  CULTURE, BLOOD (ROUTINE X 2)     Status: None   Collection Time    12/27/13 11:28 AM      Result Value Ref Range Status   Specimen Description BLOOD RIGHT HAND   Final   Special Requests BOTTLES DRAWN AEROBIC AND ANAEROBIC 5 CC EA   Final   Culture  Setup Time     Final   Value: 12/27/2013 14:30     Performed at [facility]   Culture     Final   Value: NO GROWTH 5 DAYS     Performed at [facility]   Report Status 01/02/2014 FINAL   Final  CULTURE, BLOOD (ROUTINE X 2)     Status: None   Collection Time    12/31/13  9:34 AM      Result Value Ref Range Status   Specimen Description BLOOD LEFT ARM   Final   Special Requests BLOOD 5CC   Final   Culture  Setup Time     Final   Value: 12/31/2013 13:29     Performed at [facility]   Culture     Final   Value:        BLOOD CULTURE RECEIVED NO GROWTH TO DATE CULTURE WILL BE HELD FOR 5 DAYS BEFORE ISSUING A FINAL NEGATIVE REPORT     Performed at [facility]   Report Status PENDING   Incomplete  CULTURE, BLOOD (ROUTINE X 2)     Status: None   Collection Time    12/31/13  9:38 AM      Result Value Ref Range Status   Specimen Description BLOOD LEFT HAND  Final   Special Requests BOTTLES DRAWN AEROBIC AND ANAEROBIC 5CC   Final   Culture  Setup Time     Final   Value: 12/31/2013 13:28     Performed at [facility]   Culture     Final   Value:        BLOOD CULTURE RECEIVED NO GROWTH TO DATE CULTURE WILL BE HELD FOR 5 DAYS BEFORE  ISSUING A FINAL NEGATIVE REPORT     Performed at [facility]   Report Status PENDING   Incomplete    Studies/Results: No results found.  Assessment:  strep virdans bacteremia. Negative TEE for endocarditis. Documented clearance of bacteremia on repeat blood cx from 12/27/12 but has episodic fevers.  Plan: 1. Recommend to treat for period of 4 wks with ceftriaxone 2gm IV dail for presumed strep viridans infective thrombus 2. Currently is on day 10 of 28 days. Needs additional 18 days of antibiotics  3. Will see her back in ID clinic in 2-3 wks  Carlyle Basques, [place] for [place] [phone] pager   [phone] cell 01/03/2014, 10:30 AM

## 2014-01-03 NOTE — Progress Notes (Signed)
[facility] PROGRESS NOTE  [name] [name] DGU:[id number] DOB: January 11, 1956 DOA: 12/22/2013 PCP: Philis Fendt, MD   Assessment/Plan: 1. Sepsis with strep bacteremia She has not spiked any further temperatures since the evening of 12/30/13. Repeat cultures from 12/31/13 and blood cultures from 12/27/13 showing no growth thus far, as she has not spiked further temperatures.   1. Transesophageal echocardiogram showed no evidence of vegetation 2. I discussed case with Dr. Graylon Good of infectious disease on 12/30/13, we discussed cultures, lab results including a negative TEE. She recommended treating with ceftriaxone for a total of 2 weeks as Midline was placed on 12/30/13 then discontinued because of leaking.  2. DM - Her blood sugars are elevated, presently on 20 units. Will increase her Lantus to 25 units Phelps q ha. Continue sliding scale coverage.  3. HTN 1. Blood pressures better controlled as SBP's today 's to 140's. Lisinopril 10mg  PO q daily added on 12/29/13.  4. Pancreatic cancer with biliary occlusion 1. S/p biliary stent about 1-2 mos ago 2. Patent stent on CT abd 5. DVT prophylaxis 1. Heparin subQ 6. LE swelling 1. Suspect venous stasis vs secondary to amlodipine 2. Elevate LE for now  Code Status: Full Disposition Plan: Repeat blood cultures are pending.    Antibiotics:  Vancomycin 12/23/13>>>2/9  Zosyn 12/23/13>>>2/9  Rocephin 2 g IV q 24 hours.   HPI/Subjective: Patient's second set of blood cultures remaining negative. She underwent transesophageal echocardiogram today with no complications. TEE did not show evidence of evidence of vegetation  Patient reporting severe abdominal pain today, she is tearful during my encounter.   Objective: Filed Vitals:   01/02/14 1416 01/02/14 2100 01/03/14 0530 01/03/14 1422  BP: 132/70 147/70 135/67 132/59  Pulse: 70 78 67 55  Temp: 98.2 F (36.8 C) 98.2 F (36.8 C) 98.3 F (36.8 C) 98.4 F (36.9 C)  TempSrc: Oral Oral Oral Oral  Resp:  16 16 16 14   Height:      Weight:      SpO2: 97% 100% 99% 93%    Intake/Output Summary (Last 24 hours) at 01/03/14 1430 Last data filed at 01/03/14 1300  Gross per 24 hour  Intake   2125 ml  Output   1401 ml  Net    724 ml   Filed Weights   12/24/13 0528 12/25/13 0600 12/25/13 2226  Weight: 73.1 kg (161 lb 2.5 oz) 75.7 kg (166 lb 14.2 oz) 77.5 kg (170 lb 13.7 oz)    Exam:   General:  Awake, in mild discomfort, appears anxious  Cardiovascular: tachycardic, s1, s2, no murmurs  Respiratory: normal resp effort, no wheezing or crackles  Abdomen: soft, nondistended  Musculoskeletal: perfused, no clubbing   Data Reviewed: Basic Metabolic Panel:  Recent Labs Lab 12/30/13 0509 12/31/13 0557 01/01/14 0539 01/03/14 0438  NA 137 137 137 140  K 3.2* 3.6* 3.4* 3.5*  CL 102 103 102 105  CO2 23 20 22 23   GLUCOSE 217* 147* 149* 74  BUN 8 12 17 15   CREATININE 0.76 0.87 0.76 0.77  CALCIUM 8.3* 8.3* 9.0 8.8   Liver Function Tests: No results found for this basename: AST, ALT, ALKPHOS, BILITOT, PROT, ALBUMIN,  in the last 168 hours No results found for this basename: LIPASE, AMYLASE,  in the last 168 hours No results found for this basename: AMMONIA,  in the last 168 hours CBC:  Recent Labs Lab 12/30/13 0509 12/31/13 0557 01/01/14 0539 01/03/14 0438  WBC 4.9 16.4* 6.4 6.2  NEUTROABS  --  15.2*  --   --   HGB 8.6* 7.9* 8.2* 8.4*  HCT 27.1* 24.7* 25.7* 26.0*  MCV 88.3 88.2 88.0 87.0  PLT 203 119* 221 260   Cardiac Enzymes: No results found for this basename: CKTOTAL, CKMB, CKMBINDEX, TROPONINI,  in the last 168 hours BNP (last 3 results) No results found for this basename: PROBNP,  in the last 8760 hours CBG:  Recent Labs Lab 01/02/14 1134 01/02/14 1639 01/02/14 2057 01/03/14 0710 01/03/14 1129  GLUCAP 146* 190* 254* 121* 125*    Recent Results (from the past 240 hour(s))  RAPID STREP SCREEN     Status: None   Collection Time    12/26/13 11:22 AM       Result Value Ref Range Status   Streptococcus, Group A Screen (Direct) NEGATIVE  NEGATIVE Final   Comment: (NOTE)     A Rapid Antigen test may result negative if the antigen level in the     sample is below the detection level of this test. The FDA has not     cleared this test as a stand-alone test therefore the rapid antigen     negative result has reflexed to a Group A Strep culture.  CULTURE, GROUP A STREP     Status: None   Collection Time    12/26/13 11:22 AM      Result Value Ref Range Status   Specimen Description THROAT   Final   Special Requests NONE   Final   Culture     Final   Value: No Beta Hemolytic Streptococci Isolated     Performed at [facility]   Report Status 12/28/2013 FINAL   Final  CULTURE, BLOOD (ROUTINE X 2)     Status: None   Collection Time    12/27/13 11:20 AM      Result Value Ref Range Status   Specimen Description BLOOD RIGHT ARM   Final   Special Requests BOTTLES DRAWN AEROBIC AND ANAEROBIC 10 CC   Final   Culture  Setup Time     Final   Value: 12/27/2013 14:30     Performed at [facility]   Culture     Final   Value: NO GROWTH 5 DAYS     Performed at [facility]   Report Status 01/02/2014 FINAL   Final  CULTURE, BLOOD (ROUTINE X 2)     Status: None   Collection Time    12/27/13 11:28 AM      Result Value Ref Range Status   Specimen Description BLOOD RIGHT HAND   Final   Special Requests BOTTLES DRAWN AEROBIC AND ANAEROBIC 5 CC EA   Final   Culture  Setup Time     Final   Value: 12/27/2013 14:30     Performed at [facility]   Culture     Final   Value: NO GROWTH 5 DAYS     Performed at [facility]   Report Status 01/02/2014 FINAL   Final  CULTURE, BLOOD (ROUTINE X 2)     Status: None   Collection Time    12/31/13  9:34 AM      Result Value Ref Range Status   Specimen Description BLOOD LEFT ARM   Final   Special Requests BLOOD 5CC   Final   Culture  Setup Time     Final   Value: 12/31/2013  13:29     Performed at [facility]  Final   Value:        BLOOD CULTURE RECEIVED NO GROWTH TO DATE CULTURE WILL BE HELD FOR 5 DAYS BEFORE ISSUING A FINAL NEGATIVE REPORT     Performed at [facility]   Report Status PENDING   Incomplete  CULTURE, BLOOD (ROUTINE X 2)     Status: None   Collection Time    12/31/13  9:38 AM      Result Value Ref Range Status   Specimen Description BLOOD LEFT HAND   Final   Special Requests BOTTLES DRAWN AEROBIC AND ANAEROBIC 5CC   Final   Culture  Setup Time     Final   Value: 12/31/2013 13:28     Performed at [facility]   Culture     Final   Value:        BLOOD CULTURE RECEIVED NO GROWTH TO DATE CULTURE WILL BE HELD FOR 5 DAYS BEFORE ISSUING A FINAL NEGATIVE REPORT     Performed at [facility]   Report Status PENDING   Incomplete     Studies: No results found.  Scheduled Meds: . amLODipine  10 mg Oral Daily  . aspirin  81 mg Oral Daily  . cefTRIAXone (ROCEPHIN)  IV  2 g Intravenous Q24H  . gabapentin  300 mg Oral TID  . heparin  5,000 Units Subcutaneous 3 times per day  . insulin aspart  0-9 Units Subcutaneous TID WC  . insulin aspart  5 Units Subcutaneous TID WC  . insulin glargine  20 Units Subcutaneous QHS  . lisinopril  10 mg Oral Daily  . metoprolol tartrate  50 mg Oral BID  . pantoprazole  40 mg Oral BID  . potassium chloride  10 mEq Oral Daily  . simvastatin  10 mg Oral QHS  . sodium chloride  3 mL Intravenous Q12H  . sodium chloride  3 mL Intravenous Q12H  . sucralfate  1 g Oral BID   Continuous Infusions: . sodium chloride 1,000 mL (01/02/14 1829)  . sodium chloride      Principal Problem:   Sepsis Active Problems:   DM (diabetes mellitus)   HTN (hypertension)   Biliary stricture   Pancreatic cancer   Abdominal pain, epigastric   Time spent: 35 min   Gwendoline Judy  [facility] Pager [phone]. If 7PM-7AM, please contact night-coverage at [email or web address],  password [facility] 01/03/2014, 2:30 PM  LOS: 12 days

## 2014-01-04 ENCOUNTER — Other Ambulatory Visit (HOSPITAL_COMMUNITY): Payer: Medicaid Other

## 2014-01-04 ENCOUNTER — Inpatient Hospital Stay (HOSPITAL_COMMUNITY): Payer: Medicaid Other

## 2014-01-04 DIAGNOSIS — K838 Other specified diseases of biliary tract: Secondary | ICD-10-CM

## 2014-01-04 DIAGNOSIS — M549 Dorsalgia, unspecified: Secondary | ICD-10-CM

## 2014-01-04 DIAGNOSIS — F3289 Other specified depressive episodes: Secondary | ICD-10-CM

## 2014-01-04 DIAGNOSIS — R229 Localized swelling, mass and lump, unspecified: Secondary | ICD-10-CM

## 2014-01-04 DIAGNOSIS — F329 Major depressive disorder, single episode, unspecified: Secondary | ICD-10-CM

## 2014-01-04 DIAGNOSIS — R109 Unspecified abdominal pain: Secondary | ICD-10-CM

## 2014-01-04 DIAGNOSIS — D638 Anemia in other chronic diseases classified elsewhere: Secondary | ICD-10-CM

## 2014-01-04 DIAGNOSIS — G893 Neoplasm related pain (acute) (chronic): Secondary | ICD-10-CM

## 2014-01-04 LAB — GLUCOSE, CAPILLARY
GLUCOSE-CAPILLARY: 191 mg/dL — AB (ref 70–99)
GLUCOSE-CAPILLARY: 127 mg/dL — AB (ref 70–99)
GLUCOSE-CAPILLARY: 147 mg/dL — AB (ref 70–99)
Glucose-Capillary: 250 mg/dL — ABNORMAL HIGH (ref 70–99)

## 2014-01-04 NOTE — Progress Notes (Signed)
CARE MANAGEMENT NOTE 01/04/2014  Patient:  [name] [name], [name] [name]   Account Number:  [id number]  Date Initiated:  12/24/2013  Documentation initiated by:  DAVIS,RHONDA  Subjective/Objective Assessment:   pt with temp 102.4, hypotensive, sepsis, recent hx of biliary stent placement for pancreatic ca.     Action/Plan:   home when stable/lives at home with support for her children.   Anticipated DC Date:  01/04/2014   Anticipated DC Plan:  [place]  In-house referral  NA      DC Planning Services  CM consult      [facility] Choice  NA   Choice offered to / List presented to:  C-1 Patient   DME arranged  NA      DME agency  NA     [place] arranged  HH-1 RN  IV Antibiotics      [place].   Status of service:  In process, will continue to follow Medicare Important Message given?  NA - LOS <3 / Initial given by admissions (If response is "NO", the following Medicare IM given date fields will be blank) Date Medicare IM given:   Date Additional Medicare IM given:    Discharge Disposition:    Per UR Regulation:  Reviewed for med. necessity/level of care/duration of stay  If discussed at [place] of Stay Meetings, dates discussed:   12/30/2013  01/04/2014    Comments:  01/04/14 Leverett Camplin RN,BSN NCM [phone] CT ABD RESULTS NOTED, FOR U/S ABD,AWAIT FINDINGS.PATIENT, DTRS,[facility] IV RN REP, & CASE MGR WERE IN RM TOGETHER WHILE HOME IV ABX INSTRUCTION GIVEN.DTRS VOICED UNDERSTANDING.AWAITING FOR HHRN-IV ABX,DRUG W/FREQ/DURATION ORDER,& PICC LINE FLUSH CARE ORDERS.AWAIT FINAL HHRN/AIDE,SW ORDER.  01/03/14 Marley Charlot RN,BSN NCM [phone] PATIENT NOW AGREEABLE TO [facility] HHRN-LONG TERM IV ABX.SPOKE TO PATIENT [place] REP KRISTEN SO THAT PATIENT UNDERSTOOD ALL INFORMATION ABOUT HOME IV ABX,& HER COMMITTMENT TO LEARN.PATIENT VOICED UNDERSTANDING.MD ALSO MET WITH PATIENT WITH CASE MGR TO CONFIRM PATIENT'S UNDERSTANDING OF ALL TREATMENT PLANS.PATIENT VOICED  UNDERSTANDING.[facility] AWAITING FINAL HH RN IV ABX ORDERS.  12/31/13 Alisandra Son RN,BSN NCM [phone] PER [facility] KRISTEN PATIENT DOES NOT WANT TO DO ANY HOME IV ABX.WOULD RECOMMEND ORAL ABX.PICC D/C.PATIENT WANTS A HOSPITAL BED,MATTRESS,& A CANE.EXPLAINED THAT THESE ITEMS WOULD BE ORDERED BY MD IF MD FELT THERE WAS A MEDICAL NEED.PATIENT VOICED UNDERSTANDING.WOULD RECOMMEND PT CONS.MD NOTIFIED.  12/30/13 Sho Salguero RN,BSN NCM [phone] PICC RUE IN PLACE.RECEIVED HHRN ORDERS FOR LONG TERM IV ABX.[facility] CHOSEN.TC [facility] REP KRISTEN AWARE OF HHRN-IV ABX ORDER.EXPLAINED THAT PATIENT VERY ANXIOUS ABOUT LEARNING ABOUT HOME IV ABX.PATIENT HAS DTR WHO WILL ALSO LEARN.  12/27/13 Ayomide Purdy RN,BSN NCM [phone] TRANSFER FROM SDU.PATIENT ASKING FOR NURSE'S AIDE,& CANE.EXPLAINED THAT ATTENDING WOULD EVALUATE & MAKE RECOMMENDATIONS,THEN ORDER REQUIRED SERVICES IF MEDICALLY NEEDED.PATIENT VOICED UNDERSTANDING.  [id number] Rosana Hoes, RN, BSN, [place] [phone] Chart Reviewed for discharge and hospital needs. Discharge needs at time of review:  None present will follow for needs. Review of patient progress due on [id number].

## 2014-01-04 NOTE — Consult Note (Signed)
Reason for Consult:  Bacteremia with concern for gallbladder abscess Referring Physician: Kelvin Cellar, MD   [name] [name] is an 58 y.o. female.  HPI: 58 y/o female with hx of tobacco and ETOH use with recurrent pancreatitis.  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 Initiation of radiation and Xeloda 07/20/2013 with completion 08/09/2013. In July 2014,  Dr. Ardis Hughs placed a plastic biliary stent. She has had Chemotherapy with Xeloda and radiation therapy.  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  In September, 2014 she had the plastic biliary stent removed and a metal stent was placed.    She presented to the ED with fever of 104.9 on 12/22/13.  She was found to have a Strep virdans bacteremia.  TEE was negative for endocarditis, Mild to moderate MR, Mild MR, NO PR, the PA pressure was 47.  The EF was 65-70%.  Pt has shown good improvement on antibiotics and repeat CT scan was done yesterday, 01/03/14.  It shows functioning biliary stent, with poorly defined pancreatic head mass that is stable.  Gallbladder wall thickening and pericholecystic fluid vs Phrygian cap.   An [place] was recommended because of the fluid/Phrygian cap finding.  This showed a collection adjacent to the fundus of the gallbladder about 2.0 CM in size.  Radiology could not definitively characterize this and there is concern that this could be an abscess.     We were ask to see in consult.   Pt was suppose to see DR. Byerly according to Dr. Benay Spice, to see if she is a candidate for resection; but that has not occurred. She has apparently been lost to follow up, until she came back with the fever.  Past Medical History  Diagnosis Date  Adenocarcinoma of the pancreas presenting with an obstructing pancreas head mass, clinical  stage II versus III (T3-T4, N1); potential abutment of the celiac axis noted on the MRI abdomen 05/22/2013. Initiation of radiation and Xeloda 07/20/2013 with completion 08/09/2013. Obstructive jaundice secondary to #1. Status post placement of a bile duct stent on 05/24/2013. Pain secondary to pancreas cancer.  Metal Stent placement 07/2013. Controlled with hydromorphone.    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        Past Surgical History  Procedure Laterality Date  . Appendectomy    . Ercp N/A 05/24/2013    Procedure: ENDOSCOPIC RETROGRADE CHOLANGIOPANCREATOGRAPHY (ERCP);  Surgeon: Milus Banister, MD;  Location: [facility];  Service: Endoscopy;  Laterality: N/A;  MAC vs. general per anesthesia   . Tubal ligation    . Endoscopic retrograde cholangiopancreatography (ercp) with propofol N/A 10/21/2013    Procedure: ENDOSCOPIC RETROGRADE CHOLANGIOPANCREATOGRAPHY (ERCP) WITH PROPOFOL;  Surgeon: Milus Banister, MD;  Location: [facility] ENDOSCOPY;  Service: Endoscopy;  Laterality: N/A;  Stent removal   . Biliary stent placement N/A 10/21/2013    Procedure: BILIARY STENT PLACEMENT;  Surgeon: Milus Banister, MD;  Location: [facility] ENDOSCOPY;  Service:  Endoscopy;  Laterality: N/A;  . Tee without cardioversion N/A 12/29/2013    Procedure: TRANSESOPHAGEAL ECHOCARDIOGRAM (TEE);  Surgeon: Dorothy Spark, MD;  Location: [facility] ENDOSCOPY;  Service: Cardiovascular;  Laterality: N/A;    Family History  Problem Relation Age of Onset  . Diabetes type II Mother   . Hypertension Mother   . Heart attack Father   . HIV/AIDS Brother     Social History:  reports that she quit  smoking about 7 months ago. She has never used smokeless tobacco. She reports that she does not drink alcohol or use illicit drugs. Tobacco 43 years .5-2PPD ETOH:  Positive Drugs:  None  Allergies:  Allergies  Allergen Reactions  . Betadine [Povidone Iodine] Itching and Rash    Medications:  Prior to Admission:  Prescriptions prior to admission  Medication Sig Dispense Refill  . amLODipine (NORVASC) 10 MG tablet Take 10 mg by mouth daily.      Marland Kitchen aspirin 81 MG tablet Take 81 mg by mouth daily.      Marland Kitchen gabapentin (NEURONTIN) 300 MG capsule Take 300 mg by mouth 3 (three) times daily.      Marland Kitchen HYDROmorphone (DILAUDID) 4 MG tablet Take 2-4 mg by mouth every 4 (four) hours as needed for moderate pain or severe pain.      Marland Kitchen ibuprofen (ADVIL) 200 MG tablet Take 800 mg by mouth every 6 (six) hours as needed for mild pain or moderate pain.       Marland Kitchen insulin aspart (NOVOLOG) 100 UNIT/ML injection Inject 10 Units into the skin 3 (three) times daily with meals.      . insulin glargine (LANTUS) 100 UNIT/ML injection Inject 30 Units into the skin at bedtime.       Marland Kitchen lisinopril (PRINIVIL,ZESTRIL) 40 MG tablet 1 tablet by mouth daily  30 tablet  6  . OxyCODONE (OXYCONTIN) 10 mg T12A 12 hr tablet Take 10 mg by mouth 2 (two) times daily as needed (pain).       Marland Kitchen oxyCODONE-acetaminophen (PERCOCET/ROXICET) 5-325 MG per tablet 1 or 2 tabs PO q6h prn pain  25 tablet  0  . pantoprazole (PROTONIX) 40 MG tablet Take 40 mg by mouth 2 (two) times daily.      . polyethylene glycol (MIRALAX / GLYCOLAX) packet Take 17 g by mouth daily as needed for mild constipation or moderate constipation (constipation).       . potassium chloride (K-DUR,KLOR-CON) 10 MEQ tablet Take 10 mEq by mouth daily.      . simvastatin (ZOCOR) 10 MG tablet Take 10 mg by mouth at bedtime.      . sucralfate (CARAFATE) 1 GM/10ML suspension Take 10 mLs (1 g total) by mouth 2 (two) times daily.  420 mL  11  . simethicone (MYLICON) 826 MG chewable tablet  Chew 125 mg by mouth every 6 (six) hours as needed for flatulence.       Scheduled: . amLODipine  10 mg Oral Daily  . aspirin  81 mg Oral Daily  . cefTRIAXone (ROCEPHIN)  IV  2 g Intravenous Q24H  . gabapentin  300 mg Oral TID  . heparin  5,000 Units Subcutaneous 3 times per day  . insulin aspart  0-9 Units Subcutaneous TID WC  . insulin aspart  5 Units Subcutaneous TID WC  . insulin glargine  20 Units Subcutaneous QHS  . lisinopril  10 mg Oral Daily  . metoprolol tartrate  50 mg Oral BID  . pantoprazole  40 mg Oral  BID  . potassium chloride  10 mEq Oral Daily  . simvastatin  10 mg Oral QHS  . sodium chloride  3 mL Intravenous Q12H  . sodium chloride  3 mL Intravenous Q12H  . sucralfate  1 g Oral BID   Continuous: . sodium chloride 1,000 mL (01/04/14 1558)  . sodium chloride     VQX:[id number], ALPRAZolam, feeding supplement (GLUCERNA SHAKE), hydrALAZINE, HYDROmorphone, ibuprofen, oxyCODONE-acetaminophen, sodium chloride, sodium chloride, sodium chloride, sodium chloride Anti-infectives   Start     Dose/Rate Route Frequency Ordered Stop   12/27/13 1200  cefTRIAXone (ROCEPHIN) 2 g in dextrose 5 % 50 mL IVPB     2 g 100 mL/hr over 30 Minutes Intravenous Every 24 hours 12/27/13 1055     12/25/13 1800  vancomycin (VANCOCIN) IVPB 1000 mg/200 mL premix  Status:  Discontinued     1,000 mg 200 mL/hr over 60 Minutes Intravenous Every 24 hours 12/25/13 0855 12/27/13 1046   12/25/13 1000  vancomycin (VANCOCIN) IVPB 750 mg/150 ml premix  Status:  Discontinued     750 mg 150 mL/hr over 60 Minutes Intravenous Every 12 hours 12/25/13 0613 12/25/13 0756   12/24/13 2315  vancomycin (VANCOCIN) IVPB 750 mg/150 ml premix     750 mg 150 mL/hr over 60 Minutes Intravenous  Once 12/24/13 2301 12/25/13 0011   12/23/13 1800  vancomycin (VANCOCIN) IVPB 1000 mg/200 mL premix  Status:  Discontinued     1,000 mg 200 mL/hr over 60 Minutes Intravenous Every 12 hours 12/23/13 1327 12/24/13 1250    12/23/13 0600  vancomycin (VANCOCIN) IVPB 750 mg/150 ml premix  Status:  Discontinued     750 mg 150 mL/hr over 60 Minutes Intravenous Every 12 hours 12/22/13 1844 12/23/13 1327   12/23/13 0000  piperacillin-tazobactam (ZOSYN) IVPB 3.375 g  Status:  Discontinued     3.375 g 12.5 mL/hr over 240 Minutes Intravenous Every 8 hours 12/22/13 1844 12/27/13 1046   12/22/13 1715  vancomycin (VANCOCIN) IVPB 1000 mg/200 mL premix     1,000 mg 200 mL/hr over 60 Minutes Intravenous  Once 12/22/13 1626 12/22/13 1910   12/22/13 1645  piperacillin-tazobactam (ZOSYN) IVPB 3.375 g     3.375 g 100 mL/hr over 30 Minutes Intravenous  Once 12/22/13 1626 12/22/13 1814      Results for orders placed during the hospital encounter of 12/22/13 (from the past 48 hour(s))  GLUCOSE, CAPILLARY     Status: Abnormal   Collection Time    01/02/14  8:57 PM      Result Value Ref Range   Glucose-Capillary 254 (*) 70 - 99 mg/dL   Comment 1 Documented in Chart     Comment 2 Notify RN    CBC     Status: Abnormal   Collection Time    01/03/14  4:38 AM      Result Value Ref Range   WBC 6.2  4.0 - 10.5 K/uL   RBC 2.99 (*) 3.87 - 5.11 MIL/uL   Hemoglobin 8.4 (*) 12.0 - 15.0 g/dL   HCT 26.0 (*) 36.0 - 46.0 %   MCV 87.0  78.0 - 100.0 fL   MCH 28.1  26.0 - 34.0 pg   MCHC 32.3  30.0 - 36.0 g/dL   RDW 15.2  11.5 - 15.5 %   Platelets 260  150 - 400 K/uL  BASIC METABOLIC PANEL     Status: Abnormal   Collection Time    01/03/14  4:38 AM  Result Value Ref Range   Sodium 140  137 - 147 mEq/L   Potassium 3.5 (*) 3.7 - 5.3 mEq/L   Chloride 105  96 - 112 mEq/L   CO2 23  19 - 32 mEq/L   Glucose, Bld 74  70 - 99 mg/dL   BUN 15  6 - 23 mg/dL   Creatinine, Ser 0.77  0.50 - 1.10 mg/dL   Calcium 8.8  8.4 - 10.5 mg/dL   GFR calc non Af Amer 89 (*) >90 mL/min   GFR calc Af Amer >90  >90 mL/min   Comment: (NOTE)     The eGFR has been calculated using the CKD EPI equation.     This calculation has not been validated in all  clinical situations.     eGFR's persistently <90 mL/min signify possible Chronic Kidney     Disease.  GLUCOSE, CAPILLARY     Status: Abnormal   Collection Time    01/03/14  7:10 AM      Result Value Ref Range   Glucose-Capillary 121 (*) 70 - 99 mg/dL  GLUCOSE, CAPILLARY     Status: Abnormal   Collection Time    01/03/14 11:29 AM      Result Value Ref Range   Glucose-Capillary 125 (*) 70 - 99 mg/dL  GLUCOSE, CAPILLARY     Status: None   Collection Time    01/03/14  4:50 PM      Result Value Ref Range   Glucose-Capillary 97  70 - 99 mg/dL   Comment 1 Notify RN     Comment 2 Documented in Chart    GLUCOSE, CAPILLARY     Status: Abnormal   Collection Time    01/03/14  9:28 PM      Result Value Ref Range   Glucose-Capillary 276 (*) 70 - 99 mg/dL  GLUCOSE, CAPILLARY     Status: Abnormal   Collection Time    01/04/14  7:25 AM      Result Value Ref Range   Glucose-Capillary 250 (*) 70 - 99 mg/dL  GLUCOSE, CAPILLARY     Status: Abnormal   Collection Time    01/04/14 11:47 AM      Result Value Ref Range   Glucose-Capillary 127 (*) 70 - 99 mg/dL    Ct Abdomen Pelvis W Contrast  01/03/2014   CLINICAL DATA:  Sepsis, history of pancreatic malignancy with biliary stent placement, normal white blood cell count  EXAM: CT ABDOMEN AND PELVIS WITH CONTRAST  TECHNIQUE: Multidetector CT imaging of the abdomen and pelvis was performed using the standard protocol following bolus administration of intravenous contrast.  CONTRAST:  157m OMNIPAQUE IOHEXOL 300 MG/ML SOLN intravenously, the patient also received oral contrast material.  COMPARISON:  DG ABD PORTABLE 1V dated 12/23/2013; CT ABD/PELVIS W CM dated 12/22/2013  FINDINGS: A large caliber biliary stent is in place. There is gas within the biliary tree which is a stable finding. No hepatic masses are demonstrated. The known pancreatic mass remains poorly defined in the region of the pancreatic head but has not dramatically changed since the previous  study. There is mild atrophy of the pancreatic body and tail. The gallbladder wall appears mildly thickened. There may be either a Phrygian cap or a small amount of pericholecystic fluid. When compared to the previous study this reflects a change.  The spleen is not enlarged. There is a stable approximately 2 cm maximal dimension accessory spleen. There are no adrenal masses. The right kidney is  normal in appearance. There is an exophytic thick walled appearing 1.9 cm diameter hypodensity associated with the medial aspect of the midpole of the left kidney. It exhibits Hounsfield measurement of +10 immediately post contrast and +6 on delayed images. On delayed images contrast within the renal collecting systems is normal.  The lumbar vertebral bodies are preserved in height. There is disc space narrowing at L4-5. The bony pelvis exhibits no lytic or blastic lesion.  The lung bases exhibit minimal subsegmental atelectasis bilaterally. There is no pleural effusion. The cardiac chambers are enlarged where visualized.  The stomach is nondistended. The duodenum does not appear abnormally distended nor inflamed. The jejunum and ileum are partially contrast filled and exhibit no evidence of obstruction or enteritis. There is a moderate stool burden within the colon without evidence of obstruction. There are no findings to suggest colitis or diverticulitis. There is a small amount of fluid in the right aspect of the pelvis above the uterus. Coarse calcifications within the uterus are consistent with fibroids. The partially distended urinary bladder is normal in appearance. There is no inguinal hernia. There is a small umbilical hernia containing fat.  IMPRESSION: 1. The biliary stent appears to be functioning. Biliary tract gas is present and stable. The poorly defined pancreatic head mass appears stable. 2. There is questionable gallbladder wall thickening and pericholecystic fluid versus a Phrygian cap. No stones are  evident. Correlation with any symptoms in the right upper quadrant would be useful. Gallbladder ultrasound may be of value. 3. There is a stable exophytic thick walled cyst associated with the mid pole cortex of the left kidney. 4. There is no evidence of bowel obstruction or ileus. 5. There is a small amount of free fluid in the pelvis. Multiple calcified and noncalcified uterine fibroids are suspected.   Electronically Signed   By: Naveyah Iacovelli  [place]   On: 01/03/2014 19:20   US Abdomen Limited Ruq  01/04/2014   CLINICAL DATA:  Abnormal CT with questionable gallbladder wall thickening, assessment for acute cholecystitis, history of pancreatic malignancy and biliary stenting  EXAM: US ABDOMEN LIMITED - RIGHT UPPER QUADRANT  COMPARISON:  CT abdomen 01/03/2014  FINDINGS: Gallbladder:  Hypoechoic material fills the gallbladder lumen. This material has a minimally echogenic wall. Suspect this represents a large sludge ball with minimal surrounding bile. Associated gallbladder wall thickening up to 4 mm thick. No sonographic Murphy sign. No gallstones identified.  Common bile duct:  Diameter: CBD stent present, difficult to measure lumen.  Liver:  Hypoechoic collection seen adjacent to the fundus the gallbladder, approximately 2.0 cm in greatest size, hypoechoic versus remainder of liver. Suspect this represents a pericholecystic collection, cannot exclude abscess/infection. This corresponds with the small collection identified adjacent to the fundus of the gallbladder on prior CT which was felt represent a Phrygian cap, though this does not have the typical appearance of a Phrygian cap by ultrasound.  No right upper quadrant free fluid.  IMPRESSION: Gallbladder wall thickening with large sludge ball within gallbladder.  No definite gallstones identified.  Additionally, pericholecystic collection identified adjacent to the fundus with gallbladder proximally 2 cm in size, cannot exclude abscess.  Differential diagnosis for  gallbladder findings includes a calculus cholecystitis, chronic cholecystitis, potentially ascending cholangitis in a patient with an indwelling biliary stent, or less likely potentially post radiation therapy change of the gallbladder (patient post XRT in September 2014), though this would be unlikely to account for a potential pericholecystic collection question abscess. .   Electronically Signed  By: Lavonia Dana M.D.   On: 01/04/2014 15:34    Review of Systems  Constitutional: Positive for fever and chills.  HENT: Positive for hearing loss (she is very hard of hearing).   Eyes: Negative.   Respiratory: Negative.   Cardiovascular: Positive for leg swelling (some lower leg swelling daily).  Gastrointestinal: Positive for heartburn and constipation (occasional). Negative for nausea, vomiting, abdominal pain, diarrhea, blood in stool and melena.  Genitourinary: Negative.   Musculoskeletal: Negative.   Skin: Negative.   Neurological: Positive for speech change (chronic since her stroke some years ago). Negative for seizures.  Endo/Heme/Allergies: Bruises/bleeds easily.  Psychiatric/Behavioral:       Mostly angry about radiation and chemotherapy   Blood pressure 122/64, pulse 51, temperature 98.1 F (36.7 C), temperature source Oral, resp. rate 18, height _0  (1.6 m), weight 77.5 kg (170 lb 13.7 oz), SpO2 100.00%. Physical Exam  Constitutional: She is oriented to person, place, and time. She appears well-developed and well-nourished. No distress.  HENT:  Head: Normocephalic and atraumatic.  Nose: Nose normal.  Eyes: Conjunctivae and EOM are normal. Pupils are equal, round, and reactive to light. Right eye exhibits no discharge. Left eye exhibits no discharge.  Neck: Normal range of motion. Neck supple. No JVD present. No tracheal deviation present. No thyromegaly present.  Cardiovascular: Normal rate and regular rhythm.   Respiratory: Effort normal and breath sounds normal. No respiratory  distress. She has no wheezes. She has no rales. She exhibits no tenderness.  GI: Soft. Bowel sounds are normal. She exhibits no distension and no mass. There is tenderness (she has some chronic pain from her cancer and takes pain medicines, but currently she is pain free.). There is no rebound and no guarding.  Musculoskeletal: She exhibits edema (trace- +1 lower legs and feet.).  Lymphadenopathy:    She has no cervical adenopathy.  Neurological: She is alert and oriented to person, place, and time. No cranial nerve deficit.  Hearing is very poor  Skin: Skin is warm and dry. No rash noted. She is not diaphoretic. No erythema. No pallor.  Psychiatric: She has a normal mood and affect. Her behavior is normal. Judgment and thought content normal.  Still very unhappy with radiation and Chemotherapy    Assessment/Plan: 1.  Strep virdans bacteremia. Negative TEE for endocarditis. Documented clearance of bacteremia on repeat blood cx from 12/27/12 but has episodic fevers. Currently scheduled for 4 weeks of treatment with Ceftriaxone. 2.  Possible 2 cm abscess vs Phrygian cap based on CT scan 01/03/14, Gallbladder wall thickening with large sludge ball within  Gallbladder, and still concern for an abscess fundus of the Gallbladder. 3.  Adenocarcinoma of the pancreas presenting with an obstructing pancreas head mass, clinical stage II versus III (T3-T4, N1); potential abutment of the celiac axis. Obstructive jaundice with plastic bile duct stent placement 05/2013, and metal stent replacement 07/2013.  4.  S/p chemotherapy and radiation therapy 5.  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  Hx of chronic pancreatitis alcohol related  7.  DM (diabetes mellitus), type 2; Insulin dependant  8.  Hypertension  9.  Hyperlipidemia  10. Depression/anxiety  11. GERD (gastroesophageal reflux disease)  12. Hearing impaired  13. DDD (degenerative disc disease), lumbar  14.  Chronic back pain  15.  Hx of tobacco use  16.  Hx of  stroke, she still has balance issues and some speech difficulty.  Plan:  Dr. Lucia Gaskins has reviewed the films, and patient is completely asymptomatic.  Last temperature spike I found was 12/30/13. It is his recommendation that she be discharged on the IV antibiotics as discussed above.  She can follow up with Medicine and ID as scheduled.  I have obtained a new patient appointment with Dr Barry Dienes on March 6 at 10:30 AM.    Earnstine Regal 01/04/2014, 4:50 PM   Agree with above.  Alphonsa Overall, MD, [facility] Pager: [phone] Office phone:  [phone]

## 2014-01-04 NOTE — Progress Notes (Signed)
[facility] PROGRESS NOTE  Ward Ammirati [id number] DOB: February 10, 1956 DOA: 12/22/2013 PCP: Philis Fendt, MD  Interim Summary Patient is a 58 year old female with a past medical history of pancreatic cancer, status post radiation therapy and Xeloda in September of 2014, had been given a referral in January of 2015 to see Dr Barry Dienes for consideration of surgical resection. She was admitted to the medicine service on 12/22/2013 at which time she presented with fever, having an initial temperature of 104.9. She was started on broad-spectrum IV antibiotic therapy with vancomycin and Zosyn. Initial chest x-ray performed on admission did not show active cardiopulmonary disease. With her history of pancreatic cancer a CT scan of abdomen and pelvis was also done on admission to look for potential intra-abdominal source of infection. Radiology reported stable pancreatic mass with little change in appearance of patent metallic biliary stent. 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 Of note there is no evidence of metastatic disease. Blood cultures drawn on 12/22/2013 grew viridans streptococcus from both sets, with there being insufficient growth for obtaining susceptibility testing. A transthoracic echocardiogram was performed on 12/25/2013  showed an ejection fraction of 65-70% without wall motion abnormalities. Valvular vegetations were not noted on this study. Source of her fever was further worked up with a transesophageal echocardiogram that was performed on 12/29/2013. There is no evidence of endocarditis with vegetation being absent. Patient was seen and evaluated by Dr. Baxter Flattery of infectious disease. On 12/27/2013 vancomycin and Zosyn were discontinued and she was started on ceftriaxone 2 g IV every 24 hours. Repeat blood cultures drawn on 12/27/2013 were negative. On 12/30/2013 she reported chills and again spiked a temperature. Blood cultures drawn on 12/31/2013 remaining  negative to date. On 01/03/2014 she appeared to complain of worsening right upper quadrant pain. A CT scan of abdomen and pelvis ordered on this date showed questionable gallbladder wall thickening and pericholecystic fluid. This was further worked up with a right upper quadrant ultrasound performed on 01/04/2014  which showed gallbladder wall thickening with large sludge ball within the gallbladder. Also noted was pericholecystic collection identified adjacent to the fundus of gallbladder approximately 2 cm in size, radiology report stating abscess cannot be excluded. I have consulted general surgery. A PICC line was placed on 01/04/2014 for prolonged IV antibiotic therapy. Infectious disease recommending a total of 4 weeks of ceftriaxone 2 g IV every 24 hours. Case manager consulted for home health services to provide IV antibiotics.  Assessment/Plan:  1. Sepsis with strep bacteremia She has not spiked any further temperatures since the evening of 12/30/13. Repeat cultures from 12/31/13 and blood cultures from 12/27/13 showing no growth thus far, as she has not spiked further temperatures.   1. Transesophageal echocardiogram showed no evidence of vegetation 2. I discussed case with Dr. Graylon Good of infectious disease on 12/30/13, we discussed cultures, lab results including a negative TEE. She recommended treating with ceftriaxone for a total of 2 weeks as Midline was placed on 12/30/13 then discontinued because of leaking.   Question cholecystitis. Patient complaining of ongoing right upper quadrant pain for which CT scan was ordered showing findings suggesting gallbladder wall thickening. This was worked up further with a running quadrant ultrasound which showed gallbladder wall thickening  with large sludge ball within the gallbladder. There is also a question of pericholecystic fluid collection which could be secondary to abscess. I discussed case with general surgery for consultation.  2. DM - Her blood sugars are elevated, presently on 20 units. Will increase her Lantus to 25 units [place] q ha. Continue sliding scale coverage.  3. HTN 1. Blood pressures better controlled as SBP's today 's to 140's. Lisinopril 10mg  PO q daily added on 12/29/13.  4. Pancreatic cancer with biliary occlusion 1. S/p biliary stent about 1-2 mos ago 2. Patent stent on CT abd 5. DVT prophylaxis 1. Heparin subQ 6. LE swelling 1. Suspect venous stasis vs secondary to amlodipine 2. Elevate LE for now  Code Status: Full Disposition Plan: Continue IV antibiotics, general surgery consultation placed today.   Consultants: Infectious disease: Dr. Baxter Flattery General surgery: Dr Lucia Gaskins Medical oncology: Dr. Benay Spice   Antibiotics:  Vancomycin 12/23/13>>>2/9  Zosyn 12/23/13>>>2/9  Rocephin 2 g IV q 24 hours.   HPI/Subjective: Patient's second set of blood cultures remaining negative. She underwent transesophageal echocardiogram today with no complications. TEE did not show evidence of evidence of vegetation  Patient complaining of ongoing abdominal pain  Objective: Filed Vitals:   01/03/14 1422 01/03/14 2132 01/04/14 0513 01/04/14 1358  BP: 132/59 128/59 118/62 122/64  Pulse: 55 72 57 51  Temp: 98.4 F (36.9 C) 98.1 F (36.7 C) 98.1 F (36.7 C)   TempSrc: Oral Oral Oral Oral  Resp: 14 18 18 18   Height:      Weight:      SpO2: 93% 97% 95% 100%    Intake/Output Summary (Last 24 hours) at 01/04/14 1554 Last data filed at 01/04/14 1500  Gross per 24 hour  Intake   1175 ml  Output    201 ml  Net    974 ml   Filed Weights   12/24/13 0528 12/25/13 0600 12/25/13 2226  Weight: 73.1 kg (161 lb 2.5 oz) 75.7 kg (166 lb 14.2 oz) 77.5 kg (170 lb 13.7 oz)    Exam:   General:  Awake, in mild  discomfort, appears anxious  Cardiovascular: tachycardic, s1, s2, no murmurs  Respiratory: normal resp effort, no wheezing or crackles  Abdomen: soft, nondistended  Musculoskeletal: perfused, no clubbing   Data Reviewed: Basic Metabolic Panel:  Recent Labs Lab 12/30/13 0509 12/31/13 0557 01/01/14 0539 01/03/14 0438  NA 137 137 137 140  K 3.2* 3.6* 3.4* 3.5*  CL 102 103 102 105  CO2 23 20 22 23   GLUCOSE 217* 147* 149* 74  BUN 8 12 17 15   CREATININE 0.76 0.87 0.76 0.77  CALCIUM 8.3* 8.3* 9.0 8.8   Liver Function Tests: No results found for this basename: AST,  ALT, ALKPHOS, BILITOT, PROT, ALBUMIN,  in the last 168 hours No results found for this basename: LIPASE, AMYLASE,  in the last 168 hours No results found for this basename: AMMONIA,  in the last 168 hours CBC:  Recent Labs Lab 12/30/13 0509 12/31/13 0557 01/01/14 0539 01/03/14 0438  WBC 4.9 16.4* 6.4 6.2  NEUTROABS  --  15.2*  --   --   HGB 8.6* 7.9* 8.2* 8.4*  HCT 27.1* 24.7* 25.7* 26.0*  MCV 88.3 88.2 88.0 87.0  PLT 203 119* 221 260   Cardiac Enzymes: No results found for this basename: CKTOTAL, CKMB, CKMBINDEX, TROPONINI,  in the last 168 hours BNP (last 3 results) No results found for this basename: PROBNP,  in the last 8760 hours CBG:  Recent Labs Lab 01/03/14 1129 01/03/14 1650 01/03/14 2128 01/04/14 0725 01/04/14 1147  GLUCAP 125* 97 276* 250* 127*    Recent Results (from the past 240 hour(s))  RAPID STREP SCREEN     Status: None   Collection Time    12/26/13 11:22 AM      Result Value Ref Range Status   Streptococcus, Group A Screen (Direct) NEGATIVE  NEGATIVE Final   Comment: (NOTE)     A Rapid Antigen test may result negative if the antigen level in the     sample is below the detection level of this test. The FDA has not     cleared this test as a stand-alone test therefore the rapid antigen     negative result has reflexed to a Group A Strep culture.  CULTURE, GROUP A STREP      Status: None   Collection Time    12/26/13 11:22 AM      Result Value Ref Range Status   Specimen Description THROAT   Final   Special Requests NONE   Final   Culture     Final   Value: No Beta Hemolytic Streptococci Isolated     Performed at [facility]   Report Status 12/28/2013 FINAL   Final  CULTURE, BLOOD (ROUTINE X 2)     Status: None   Collection Time    12/27/13 11:20 AM      Result Value Ref Range Status   Specimen Description BLOOD RIGHT ARM   Final   Special Requests BOTTLES DRAWN AEROBIC AND ANAEROBIC 10 CC   Final   Culture  Setup Time     Final   Value: 12/27/2013 14:30     Performed at [facility]   Culture     Final   Value: NO GROWTH 5 DAYS     Performed at [facility]   Report Status 01/02/2014 FINAL   Final  CULTURE, BLOOD (ROUTINE X 2)     Status: None   Collection Time    12/27/13 11:28 AM      Result Value Ref Range Status   Specimen Description BLOOD RIGHT HAND   Final   Special Requests BOTTLES DRAWN AEROBIC AND ANAEROBIC 5 CC EA   Final   Culture  Setup Time     Final   Value: 12/27/2013 14:30     Performed at [facility]   Culture     Final   Value: NO GROWTH 5 DAYS     Performed at [facility]   Report Status 01/02/2014 FINAL   Final  CULTURE, BLOOD (ROUTINE X 2)     Status: None   Collection Time  12/31/13  9:34 AM      Result Value Ref Range Status   Specimen Description BLOOD LEFT ARM   Final   Special Requests BLOOD 5CC   Final   Culture  Setup Time     Final   Value: 12/31/2013 13:29     Performed at [facility]   Culture     Final   Value:        BLOOD CULTURE RECEIVED NO GROWTH TO DATE CULTURE WILL BE HELD FOR 5 DAYS BEFORE ISSUING A FINAL NEGATIVE REPORT     Performed at [facility]   Report Status PENDING   Incomplete  CULTURE, BLOOD (ROUTINE X 2)     Status: None   Collection Time    12/31/13  9:38 AM      Result Value Ref Range Status   Specimen Description BLOOD  LEFT HAND   Final   Special Requests BOTTLES DRAWN AEROBIC AND ANAEROBIC 5CC   Final   Culture  Setup Time     Final   Value: 12/31/2013 13:28     Performed at [facility]   Culture     Final   Value:        BLOOD CULTURE RECEIVED NO GROWTH TO DATE CULTURE WILL BE HELD FOR 5 DAYS BEFORE ISSUING A FINAL NEGATIVE REPORT     Performed at [facility]   Report Status PENDING   Incomplete     Studies: Ct Abdomen Pelvis W Contrast  01/03/2014   CLINICAL DATA:  Sepsis, history of pancreatic malignancy with biliary stent placement, normal white blood cell count  EXAM: CT ABDOMEN AND PELVIS WITH CONTRAST  TECHNIQUE: Multidetector CT imaging of the abdomen and pelvis was performed using the standard protocol following bolus administration of intravenous contrast.  CONTRAST:  168mL OMNIPAQUE IOHEXOL 300 MG/ML SOLN intravenously, the patient also received oral contrast material.  COMPARISON:  DG ABD PORTABLE 1V dated 12/23/2013; CT ABD/PELVIS W CM dated 12/22/2013  FINDINGS: A large caliber biliary stent is in place. There is gas within the biliary tree which is a stable finding. No hepatic masses are demonstrated. The known pancreatic mass remains poorly defined in the region of the pancreatic head but has not dramatically changed since the previous study. There is mild atrophy of the pancreatic body and tail. The gallbladder wall appears mildly thickened. There may be either a Phrygian cap or a small amount of pericholecystic fluid. When compared to the previous study this reflects a change.  The spleen is not enlarged. There is a stable approximately 2 cm maximal dimension accessory spleen. There are no adrenal masses. The right kidney is normal in appearance. There is an exophytic thick walled appearing 1.9 cm diameter hypodensity associated with the medial aspect of the midpole of the left kidney. It exhibits Hounsfield measurement of +10 immediately post contrast and +6 on delayed images. On  delayed images contrast within the renal collecting systems is normal.  The lumbar vertebral bodies are preserved in height. There is disc space narrowing at L4-5. The bony pelvis exhibits no lytic or blastic lesion.  The lung bases exhibit minimal subsegmental atelectasis bilaterally. There is no pleural effusion. The cardiac chambers are enlarged where visualized.  The stomach is nondistended. The duodenum does not appear abnormally distended nor inflamed. The jejunum and ileum are partially contrast filled and exhibit no evidence of obstruction or enteritis. There is a moderate stool burden within the colon without evidence of obstruction.  There are no findings to suggest colitis or diverticulitis. There is a small amount of fluid in the right aspect of the pelvis above the uterus. Coarse calcifications within the uterus are consistent with fibroids. The partially distended urinary bladder is normal in appearance. There is no inguinal hernia. There is a small umbilical hernia containing fat.  IMPRESSION: 1. The biliary stent appears to be functioning. Biliary tract gas is present and stable. The poorly defined pancreatic head mass appears stable. 2. There is questionable gallbladder wall thickening and pericholecystic fluid versus a Phrygian cap. No stones are evident. Correlation with any symptoms in the right upper quadrant would be useful. Gallbladder ultrasound may be of value. 3. There is a stable exophytic thick walled cyst associated with the mid pole cortex of the left kidney. 4. There is no evidence of bowel obstruction or ileus. 5. There is a small amount of free fluid in the pelvis. Multiple calcified and noncalcified uterine fibroids are suspected.   Electronically Signed   By: David  Martinique   On: 01/03/2014 19:20   US Abdomen Limited Ruq  01/04/2014   CLINICAL DATA:  Abnormal CT with questionable gallbladder wall thickening, assessment for acute cholecystitis, history of pancreatic malignancy and  biliary stenting  EXAM: US ABDOMEN LIMITED - RIGHT UPPER QUADRANT  COMPARISON:  CT abdomen 01/03/2014  FINDINGS: Gallbladder:  Hypoechoic material fills the gallbladder lumen. This material has a minimally echogenic wall. Suspect this represents a large sludge ball with minimal surrounding bile. Associated gallbladder wall thickening up to 4 mm thick. No sonographic Murphy sign. No gallstones identified.  Common bile duct:  Diameter: CBD stent present, difficult to measure lumen.  Liver:  Hypoechoic collection seen adjacent to the fundus the gallbladder, approximately 2.0 cm in greatest size, hypoechoic versus remainder of liver. Suspect this represents a pericholecystic collection, cannot exclude abscess/infection. This corresponds with the small collection identified adjacent to the fundus of the gallbladder on prior CT which was felt represent a Phrygian cap, though this does not have the typical appearance of a Phrygian cap by ultrasound.  No right upper quadrant free fluid.  IMPRESSION: Gallbladder wall thickening with large sludge ball within gallbladder.  No definite gallstones identified.  Additionally, pericholecystic collection identified adjacent to the fundus with gallbladder proximally 2 cm in size, cannot exclude abscess.  Differential diagnosis for gallbladder findings includes a calculus cholecystitis, chronic cholecystitis, potentially ascending cholangitis in a patient with an indwelling biliary stent, or less likely potentially post radiation therapy change of the gallbladder (patient post XRT in September 2014), though this would be unlikely to account for a potential pericholecystic collection question abscess. .   Electronically Signed   By: Lavonia Dana M.D.   On: 01/04/2014 15:34    Scheduled Meds: . amLODipine  10 mg Oral Daily  . aspirin  81 mg Oral Daily  . cefTRIAXone (ROCEPHIN)  IV  2 g Intravenous Q24H  . gabapentin  300 mg Oral TID  . heparin  5,000 Units Subcutaneous 3 times  per day  . insulin aspart  0-9 Units Subcutaneous TID WC  . insulin aspart  5 Units Subcutaneous TID WC  . insulin glargine  20 Units Subcutaneous QHS  . lisinopril  10 mg Oral Daily  . metoprolol tartrate  50 mg Oral BID  . pantoprazole  40 mg Oral BID  . potassium chloride  10 mEq Oral Daily  . simvastatin  10 mg Oral QHS  . sodium chloride  3 mL Intravenous Q12H  .  sodium chloride  3 mL Intravenous Q12H  . sucralfate  1 g Oral BID   Continuous Infusions: . sodium chloride 1,000 mL (01/03/14 1611)  . sodium chloride      Principal Problem:   Sepsis Active Problems:   DM (diabetes mellitus)   HTN (hypertension)   Biliary stricture   Pancreatic cancer   Abdominal pain, epigastric   Time spent: 35 min   Trusten Hume  [facility] Pager [phone]. If 7PM-7AM, please contact night-coverage at [email or web address], password [facility] 01/04/2014, 3:54 PM  LOS: 13 days

## 2014-01-04 NOTE — Progress Notes (Addendum)
IP PROGRESS NOTE  Subjective: [name] [name] is a 58 year old woman with pancreas cancer. She completed radiation and Xeloda 07/20/2013 through 08/09/2013. She was subsequently lost to follow-up. She was last seen in the office on 12/06/2013. A referral was made at that time to Dr. Barry Dienes to determine if she is a candidate for resection.   She was admitted to the hospital 12/22/2013 with fever and altered mental status. Blood cultures returned positive for strep viridans in 2 sets. She is being followed by ID. Source presumed to be infective thrombus. Four weeks of IV antibiotics planned.   CT abdomen/pelvis on 01/03/2013 showed biliary stent that appeared to be functioning. Pancreatic head mass appeared stable. There was questionable gallbladder wall thickening and pericholecystic fluid. Stable exophytic cyst left kidney. No evidence of bowel obstruction or ileus. Small amount of free fluid in the pelvis.   She reports abdominal pain is largely unchanged as compared to several months ago. She denies nausea/vomiting. Bowels moving. No further fevers.    Objective: Vital signs in last 24 hours: Temp:  [98.1 F (36.7 C)-98.4 F (36.9 C)] 98.1 F (36.7 C) (02/17 0513) Pulse Rate:  [55-72] 57 (02/17 0513) Resp:  [14-18] 18 (02/17 0513) BP: (118-132)/(59-62) 118/62 mmHg (02/17 0513) SpO2:  [93 %-97 %] 95 % (02/17 0513)  Intake/Output from previous day: 02/16 0701 - 02/17 0700 In: 1030 [P.O.:360; I.V.:620; IV Piggyback:50] Out: 501 [Urine:500; Stool:1] Intake/Output this shift:    Faint rales at left lung base. Lungs otherwise clear. Regular cardiac rhythm. Abdomen soft, distended. Bowel sounds acitve. Mild tenderness right abdomen. No hepatomegaly. No mass. No leg edema.   Lab Results:   Recent Labs  01/03/14 0438  WBC 6.2  HGB 8.4*  HCT 26.0*  PLT 260   BMET  Recent Labs  01/03/14 0438  NA 140  K 3.5*  CL 105  CO2 23  GLUCOSE 74  BUN 15  CREATININE 0.77  CALCIUM 8.8     Studies/Results: Ct Abdomen Pelvis W Contrast  01/03/2014   CLINICAL DATA:  Sepsis, history of pancreatic malignancy with biliary stent placement, normal white blood cell count  EXAM: CT ABDOMEN AND PELVIS WITH CONTRAST  TECHNIQUE: Multidetector CT imaging of the abdomen and pelvis was performed using the standard protocol following bolus administration of intravenous contrast.  CONTRAST:  148mL OMNIPAQUE IOHEXOL 300 MG/ML SOLN intravenously, the patient also received oral contrast material.  COMPARISON:  DG ABD PORTABLE 1V dated 12/23/2013; CT ABD/PELVIS W CM dated 12/22/2013  FINDINGS: A large caliber biliary stent is in place. There is gas within the biliary tree which is a stable finding. No hepatic masses are demonstrated. The known pancreatic mass remains poorly defined in the region of the pancreatic head but has not dramatically changed since the previous study. There is mild atrophy of the pancreatic body and tail. The gallbladder wall appears mildly thickened. There may be either a Phrygian cap or a small amount of pericholecystic fluid. When compared to the previous study this reflects a change.  The spleen is not enlarged. There is a stable approximately 2 cm maximal dimension accessory spleen. There are no adrenal masses. The right kidney is normal in appearance. There is an exophytic thick walled appearing 1.9 cm diameter hypodensity associated with the medial aspect of the midpole of the left kidney. It exhibits Hounsfield measurement of +10 immediately post contrast and +6 on delayed images. On delayed images contrast within the renal collecting systems is normal.  The lumbar vertebral bodies  are preserved in height. There is disc space narrowing at L4-5. The bony pelvis exhibits no lytic or blastic lesion.  The lung bases exhibit minimal subsegmental atelectasis bilaterally. There is no pleural effusion. The cardiac chambers are enlarged where visualized.  The stomach is nondistended. The  duodenum does not appear abnormally distended nor inflamed. The jejunum and ileum are partially contrast filled and exhibit no evidence of obstruction or enteritis. There is a moderate stool burden within the colon without evidence of obstruction. There are no findings to suggest colitis or diverticulitis. There is a small amount of fluid in the right aspect of the pelvis above the uterus. Coarse calcifications within the uterus are consistent with fibroids. The partially distended urinary bladder is normal in appearance. There is no inguinal hernia. There is a small umbilical hernia containing fat.  IMPRESSION: 1. The biliary stent appears to be functioning. Biliary tract gas is present and stable. The poorly defined pancreatic head mass appears stable. 2. There is questionable gallbladder wall thickening and pericholecystic fluid versus a Phrygian cap. No stones are evident. Correlation with any symptoms in the right upper quadrant would be useful. Gallbladder ultrasound may be of value. 3. There is a stable exophytic thick walled cyst associated with the mid pole cortex of the left kidney. 4. There is no evidence of bowel obstruction or ileus. 5. There is a small amount of free fluid in the pelvis. Multiple calcified and noncalcified uterine fibroids are suspected.   Electronically Signed   By: David  Martinique   On: 01/03/2014 19:20    Assessment/Plan: 1. 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 Initiation of radiation and Xeloda 07/20/2013 with completion 08/09/2013. CT scans abdomen/pelvis done 11/01/2013 for evaluation of increased abdominal pain showed stable infiltrating pancreatic mass. No progression identified. Stable peripancreatic lymph nodes. 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 No evidence of metastatic disease. 2. Obstructive jaundice  secondary to #1. Status post placement of a bile duct stent on 05/24/2013. Status post placement of a metal stent 10/21/2013. 3. Pain secondary to pancreas cancer.  4. Diabetes. 5. History of chronic pancreatitis. 6. History of alcohol use. 7. History of tobacco use. 8. Anxiety/depression. 9. Cutaneous nodule at the left flank. Question cyst, question metastasis. 10. Abnormal EKG, question Brugada syndrome. 11. Hospitalization 08/12/2013 through 08/15/2013 with nausea/vomiting and abdominal pain. Improved. 12. 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 Portal vein patent. New wall thickening of the mid to distal stomach and the hepatic flexure and proximal transverse colon. 13. 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 She discontinued Lovenox due to abdominal wall ecchymoses and hematomas. 14. Admission 12/22/2013 with strep viridans bacteremia. On ceftriaxone with 4 week course planned. Question related to biliary stent.  15. 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 Question if source is biliary stent. Continue antibiotics per ID.   Recommend reschedule appointment with Dr. Barry Dienes regarding possibility of resection of pancreas mass.   We will schedule outpatient follow-up at the [facility] in 1-2 weeks.     LOS: 13 days    Ned Card, ANP/GNP-BC 01/04/2014 9:03 AM [name] [name] was interviewed and examined. She  will complete the recommended course of antibiotics for the strep.viridans bacteremia. I suspect the persistent abdomen/back pain is related to the pancreas tumor. She reports oxycodone relieves the pain. She can continue oxycodone at  discharge.  [name] [name] will return for a followup visit as scheduled 01/21/2014. We will reschedule the surgical appointment with Dr. Barry Dienes.  Please call oncology as needed.

## 2014-01-04 NOTE — Progress Notes (Signed)
Inpatient Diabetes Program Recommendations  AACE/ADA: New Consensus Statement on Inpatient Glycemic Control (2013)  Target Ranges:  Prepandial:   less than 140 mg/dL      Peak postprandial:   less than 180 mg/dL (1-2 hours)      Critically ill patients:  140 - 180 mg/dL   Reason for Visit: Hyperglycemia  Results for [name] [name], [name] [name] (MRN [id number]) as of 01/04/2014 11:30  Ref. Range 01/03/2014 21:28 01/04/2014 07:25  Glucose-Capillary Latest Range: 70-99 mg/dL 276 (H) 250 (H)     Inpatient Diabetes Program Recommendations Correction (SSI): Please consider ordering Novolog bedtime correction scale. If FBS >200mg /dL, increase Lantus to 22 units QHS  Will continue to follow for glycemic control. Thank you. Lorenda Peck, RD, LDN, CDE Inpatient Diabetes Coordinator [phone]

## 2014-01-05 DIAGNOSIS — R6521 Severe sepsis with septic shock: Secondary | ICD-10-CM

## 2014-01-05 DIAGNOSIS — R6520 Severe sepsis without septic shock: Secondary | ICD-10-CM

## 2014-01-05 DIAGNOSIS — A419 Sepsis, unspecified organism: Secondary | ICD-10-CM

## 2014-01-05 LAB — GLUCOSE, CAPILLARY
Glucose-Capillary: 317 mg/dL — ABNORMAL HIGH (ref 70–99)
Glucose-Capillary: 264 mg/dL — ABNORMAL HIGH (ref 70–99)
Glucose-Capillary: 251 mg/dL — ABNORMAL HIGH (ref 70–99)

## 2014-01-05 MED ORDER — DEXTROSE 5 % IV SOLN: 2.0000 g | INTRAVENOUS | Status: DC

## 2014-01-05 MED ORDER — HEPARIN SOD (PORK) LOCK FLUSH 100 UNIT/ML IV SOLN
250.0000 [IU] | INTRAVENOUS | Status: AC | PRN
  Administered 2014-01-05: 250 [IU]

## 2014-01-05 MED ORDER — LISINOPRIL 10 MG PO TABS: 10.0000 mg | ORAL_TABLET | Freq: Every day | ORAL | Status: DC

## 2014-01-05 MED ORDER — METOPROLOL TARTRATE 50 MG PO TABS: 50.0000 mg | ORAL_TABLET | Freq: Two times a day (BID) | ORAL | Status: DC

## 2014-01-05 NOTE — Discharge Summary (Signed)
Physician Discharge Summary  [name] [id number] DOB: 1956/01/01 DOA: 12/22/2013  PCP: Philis Fendt, MD  Admit date: 12/22/2013 Discharge date: 01/05/2014  Time spent: 40 minutes  Recommendations for Outpatient Follow-up:  1. Follow up with Dr. Barry Dienes as scheduled 3/6/at 10:30am 2. Follow up with Dr. Benay Spice as scheduled 3. Follow up with Dr. Baxter Flattery in 1-2 weeks  Discharge Diagnoses:  Principal Problem:   Sepsis Active Problems:   DM (diabetes mellitus)   HTN (hypertension)   Biliary stricture   Pancreatic cancer   Abdominal pain, epigastric   Discharge Condition: Improved  Diet recommendation: Diabetic  Filed Weights   12/24/13 0528 12/25/13 0600 12/25/13 2226  Weight: 73.1 kg (161 lb 2.5 oz) 75.7 kg (166 lb 14.2 oz) 77.5 kg (170 lb 13.7 oz)    History of present illness:  [name] [name] is a 58 y.o. female h/o pancreatic cancer undergoing chemo and radiation, who is brought in by family for 1 day history of fever and AMS. Patients AMS improves in ED after her fever of 104.9 that she has on arrival is treated. Patient states she has some abdominal pain but does not complain of any other symptoms. She did have 1 large episode of "explosive diarrhea" after arrival according to RN.  Hospital Course:  Patient is a 58 year old female with a past medical history of pancreatic cancer, status post radiation therapy and Xeloda in September of 2014, had been given a referral in January of 2015 to see Dr Barry Dienes for consideration of surgical resection. She was admitted to the medicine service on 12/22/2013 at which time she presented with fever, having an initial temperature of 104.9. She was started on broad-spectrum IV antibiotic therapy with vancomycin and Zosyn. Initial chest x-ray performed on admission did not show active cardiopulmonary disease. With her history of pancreatic cancer a CT scan of abdomen and pelvis was also done on admission to look for potential intra-abdominal  source of infection. Radiology reported stable pancreatic mass with little change in appearance of patent metallic biliary stent. 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 Of note there is no evidence of metastatic disease. Blood cultures drawn on 12/22/2013 grew viridans streptococcus from both sets, with there being insufficient growth for obtaining susceptibility testing. A transthoracic echocardiogram was performed on 12/25/2013 showed an ejection fraction of 65-70% without wall motion abnormalities. Valvular vegetations were not noted on this study. Source of her fever was further worked up with a transesophageal echocardiogram that was performed on 12/29/2013. There is no evidence of endocarditis with vegetation being absent. Patient was seen and evaluated by Dr. Baxter Flattery of infectious disease. On 12/27/2013 vancomycin and Zosyn were discontinued and she was started on ceftriaxone 2 g IV every 24 hours. Repeat blood cultures drawn on 12/27/2013 were negative. On 12/30/2013 she reported chills and again spiked a temperature. Blood cultures drawn on 12/31/2013 remaining negative to date. On 01/03/2014 she appeared to complain of worsening right upper quadrant pain. A CT scan of abdomen and pelvis ordered on this date showed questionable gallbladder wall thickening and pericholecystic fluid. This was further worked up with a right upper quadrant ultrasound performed on 01/04/2014 which showed gallbladder wall thickening with large sludge ball within the gallbladder. Also noted was pericholecystic collection identified adjacent to the fundus of gallbladder approximately 2 cm in size, radiology report stating abscess cannot be excluded. I have consulted general surgery. A PICC line was placed on 01/04/2014 for prolonged IV antibiotic therapy. Infectious disease recommending a total  of 4 weeks of ceftriaxone 2 g IV every 24 hours. Case manager consulted for home health services to provide  IV antibiotics.   1. Sepsis with strep bacteremia She has not spiked any further temperatures since the evening of 12/30/13. Repeat cultures from 12/31/13 and blood cultures from 12/27/13 showing no growth thus far, as she has not spiked further temperatures.  1. Transesophageal echocardiogram showed no evidence of vegetation 2. Case was discussed with Dr. Graylon Good of infectious disease on 12/30/13, with recommendations for treating with ceftriaxone for a total of 2 weeks as Picclline was placed on 12/30/13 then discontinued because of leaking.   Question cholecystitis. Patient complaining of ongoing right upper quadrant pain for which CT scan was ordered showing findings suggesting gallbladder wall thickening. This was worked up further with a running quadrant ultrasound which showed gallbladder wall thickening with large sludge ball within the gallbladder. There is also a question of pericholecystic fluid collection which could be secondary to abscess. Surgery was consulted with recommendations to continue IV antibiotics with close outpatient follow up. No surgical intervention recommended.  2. DM - Labile. Continued on lantus  3. HTN  1. Blood pressures were eventually better controlled. Lisinopril 10mg  PO q daily added on 12/29/13.  4. Pancreatic cancer with biliary occlusion  1. S/p biliary stent about 1-2 mos ago 2. Patent stent on CT abd 5. DVT prophylaxis  1. Heparin subQ 6. LE swelling  1. Suspect venous stasis vs secondary to amlodipine 2. Elevate LE for now  Consultations: Infectious disease: Dr. Baxter Flattery  General surgery: Dr Lucia Gaskins  Medical oncology: Dr. Benay Spice   Discharge Exam: Filed Vitals:   01/04/14 0513 01/04/14 1358 01/04/14 2144 01/05/14 0523  BP: 118/62 122/64 135/64 137/64  Pulse: 57 51 72 62  Temp: 98.1 F (36.7 C)  98.4 F (36.9 C) 98.3 F (36.8 C)  TempSrc: Oral Oral Oral Oral  Resp: 18 18 18 17   Height:      Weight:      SpO2: 95% 100% 97% 99%    General:  awake, in nad Cardiovascular: regular, s1, s2 Respiratory: normal resp effort, no wheezing  Discharge Instructions       Future Appointments Provider Department Dept Phone   01/21/2014 9:45 AM Ladell Pier, MD [facility] [phone]   01/21/2014 10:30 AM Stark Klein, MD [facility], [place] [phone]       Medication List         ADVIL 200 MG tablet  Generic drug:  ibuprofen  Take 800 mg by mouth every 6 (six) hours as needed for mild pain or moderate pain.     amLODipine 10 MG tablet  Commonly known as:  NORVASC  Take 10 mg by mouth daily.     aspirin 81 MG tablet  Take 81 mg by mouth daily.     dextrose 5 % SOLN 50 mL with cefTRIAXone 2 G SOLR 2 g  Inject 2 g into the vein daily.     gabapentin 300 MG capsule  Commonly known as:  NEURONTIN  Take 300 mg by mouth 3 (three) times daily.     HYDROmorphone 4 MG tablet  Commonly known as:  DILAUDID  Take 2-4 mg by mouth every 4 (four) hours as needed for moderate pain or severe pain.     insulin aspart 100 UNIT/ML injection  Commonly known as:  novoLOG  Inject 10 Units into the skin 3 (three) times daily with meals.  insulin glargine 100 UNIT/ML injection  Commonly known as:  LANTUS  Inject 30 Units into the skin at bedtime.     lisinopril 40 MG tablet  Commonly known as:  PRINIVIL,ZESTRIL  1 tablet by mouth daily     lisinopril 10 MG tablet  Commonly known as:  PRINIVIL,ZESTRIL  Take 1 tablet (10 mg total) by mouth daily.     metoprolol 50 MG tablet  Commonly known as:  LOPRESSOR  Take 1 tablet (50 mg total) by mouth 2 (two) times daily.     OxyCODONE 10 mg T12a 12 hr tablet  Commonly known as:  OXYCONTIN  Take 10 mg by mouth 2 (two) times daily as needed (pain).     oxyCODONE-acetaminophen 5-325 MG per tablet  Commonly known as:  PERCOCET/ROXICET  1 or 2 tabs PO q6h prn pain     pantoprazole 40 MG tablet  Commonly known as:  PROTONIX  Take 40 mg by mouth  2 (two) times daily.     polyethylene glycol packet  Commonly known as:  MIRALAX / GLYCOLAX  Take 17 g by mouth daily as needed for mild constipation or moderate constipation (constipation).     potassium chloride 10 MEQ tablet  Commonly known as:  K-DUR,KLOR-CON  Take 10 mEq by mouth daily.     simethicone 125 MG chewable tablet  Commonly known as:  MYLICON  Chew 0000000 mg by mouth every 6 (six) hours as needed for flatulence.     simvastatin 10 MG tablet  Commonly known as:  ZOCOR  Take 10 mg by mouth at bedtime.     sucralfate 1 GM/10ML suspension  Commonly known as:  CARAFATE  Take 10 mLs (1 g total) by mouth 2 (two) times daily.       Allergies  Allergen Reactions  . Betadine [Povidone Iodine] Itching and Rash   Follow-up Information   Follow up with [facility], MD In 2 weeks. (March 6 at 10:30 AM, be at the office 30 minutes before that for chick in)    Specialty:  General Surgery   Contact information:   [address] [place] [address] [phone]       Follow up with Betsy Coder, MD. (follow up as scheduled)    Specialty:  Oncology   Contact information:   [place] [address] [phone]       Follow up with Carlyle Basques, MD. Schedule an appointment as soon as possible for a visit in 2 weeks.   Specialty:  Infectious Diseases   Contact information:   [address] [place] [address] [phone]        The results of significant diagnostics from this hospitalization (including imaging, microbiology, ancillary and laboratory) are listed below for reference.    Significant Diagnostic Studies: Dg Chest 2 View  12/31/2013   CLINICAL DATA:  Current history of pancreatic cancer, now presenting with cough, chills, fever and shortness of breath.  EXAM: CHEST  2 VIEW  COMPARISON:  DG CHEST 1V PORT dated 12/22/2013; DG ABD ACUTE W/CHEST dated 11/01/2013; NM PET IMAGE INITIAL (PI) SKULL BASE TO  THIGH dated 06/09/2013;  FINDINGS: Cardiac silhouette mildly enlarged but stable. Thoracic aorta mildly tortuous and atherosclerotic, unchanged. Hilar and mediastinal contours otherwise unremarkable. Linear scarring in the lingula, unchanged. Lungs otherwise clear. No localized airspace consolidation. No pleural effusions. No pneumothorax. Normal pulmonary vascularity. Degenerative changes throughout the thoracic spine. Right arm venous catheter tip  projects over the expected location of the axillary vein.  IMPRESSION: 1. Stable linear scarring in the lingula. No acute cardiopulmonary disease. 2. Stable mild cardiomegaly without pulmonary edema. 3. Right arm venous catheter tip projects over the expected location of the right axillary vein.   Electronically Signed   By: Evangeline Dakin M.D.   On: 12/31/2013 10:57   Ct Abdomen Pelvis W Contrast  01/03/2014   CLINICAL DATA:  Sepsis, history of pancreatic malignancy with biliary stent placement, normal white blood cell count  EXAM: CT ABDOMEN AND PELVIS WITH CONTRAST  TECHNIQUE: Multidetector CT imaging of the abdomen and pelvis was performed using the standard protocol following bolus administration of intravenous contrast.  CONTRAST:  136mL OMNIPAQUE IOHEXOL 300 MG/ML SOLN intravenously, the patient also received oral contrast material.  COMPARISON:  DG ABD PORTABLE 1V dated 12/23/2013; CT ABD/PELVIS W CM dated 12/22/2013  FINDINGS: A large caliber biliary stent is in place. There is gas within the biliary tree which is a stable finding. No hepatic masses are demonstrated. The known pancreatic mass remains poorly defined in the region of the pancreatic head but has not dramatically changed since the previous study. There is mild atrophy of the pancreatic body and tail. The gallbladder wall appears mildly thickened. There may be either a Phrygian cap or a small amount of pericholecystic fluid. When compared to the previous study this reflects a change.  The spleen is  not enlarged. There is a stable approximately 2 cm maximal dimension accessory spleen. There are no adrenal masses. The right kidney is normal in appearance. There is an exophytic thick walled appearing 1.9 cm diameter hypodensity associated with the medial aspect of the midpole of the left kidney. It exhibits Hounsfield measurement of +10 immediately post contrast and +6 on delayed images. On delayed images contrast within the renal collecting systems is normal.  The lumbar vertebral bodies are preserved in height. There is disc space narrowing at L4-5. The bony pelvis exhibits no lytic or blastic lesion.  The lung bases exhibit minimal subsegmental atelectasis bilaterally. There is no pleural effusion. The cardiac chambers are enlarged where visualized.  The stomach is nondistended. The duodenum does not appear abnormally distended nor inflamed. The jejunum and ileum are partially contrast filled and exhibit no evidence of obstruction or enteritis. There is a moderate stool burden within the colon without evidence of obstruction. There are no findings to suggest colitis or diverticulitis. There is a small amount of fluid in the right aspect of the pelvis above the uterus. Coarse calcifications within the uterus are consistent with fibroids. The partially distended urinary bladder is normal in appearance. There is no inguinal hernia. There is a small umbilical hernia containing fat.  IMPRESSION: 1. The biliary stent appears to be functioning. Biliary tract gas is present and stable. The poorly defined pancreatic head mass appears stable. 2. There is questionable gallbladder wall thickening and pericholecystic fluid versus a Phrygian cap. No stones are evident. Correlation with any symptoms in the right upper quadrant would be useful. Gallbladder ultrasound may be of value. 3. There is a stable exophytic thick walled cyst associated with the mid pole cortex of the left kidney. 4. There is no evidence of bowel  obstruction or ileus. 5. There is a small amount of free fluid in the pelvis. Multiple calcified and noncalcified uterine fibroids are suspected.   Electronically Signed   By: David  Martinique   On: 01/03/2014 19:20   Ct Abdomen Pelvis W Contrast  12/22/2013  CLINICAL DATA:  Pancreatic carcinoma, fever, nausea  EXAM: CT ABDOMEN AND PELVIS WITH CONTRAST  TECHNIQUE: Multidetector CT imaging of the abdomen and pelvis was performed using the standard protocol following bolus administration of intravenous contrast.  CONTRAST:  9mL OMNIPAQUE IOHEXOL 300 MG/ML SOLN, 145mL OMNIPAQUE IOHEXOL 300 MG/ML SOLN  COMPARISON:  11/01/2013  FINDINGS: Subpleural scarring or dependent atelectasis in the visualized lung bases. Patent metallic biliary stent as indicated by pneumobilia. There is mild central intrahepatic biliary ductal dilatation. Poorly marginated pancreatic head mass abuts the duodenal sweep. 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 Mild dilatation of the pancreatic duct in the body and tail. Mild retroperitoneal peripancreatic inflammatory/ edematous changes as before. No new liver or peritoneal lesion. Unremarkable spleen, adrenal glands, right kidney. 2 cm thick-walled exophytic cystic lesion from the interpolar region left kidney as before. No hydronephrosis. Aortoiliac plaque. Stomach, small bowel, and colon are nondilated. Trace pelvic ascites. Coarse uterine calcification probably degenerative fibroid. Urinary bladder incompletely distended. Degenerative disc and facet disease L4-5.  IMPRESSION: 1. 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 2. Trace pelvic ascites.  No definite distal metastatic disease.   Electronically Signed   By: Arne Cleveland M.D.   On: 12/22/2013 20:52   Dg Chest Port 1 View  12/22/2013   CLINICAL DATA:  Fever. Diabetes and hypertension. Radiation  therapy for pancreatic carcinoma.  EXAM: PORTABLE CHEST - 1 VIEW  COMPARISON:  11/01/2013  FINDINGS: The heart size and mediastinal contours are within normal limits. Both lungs are clear. The visualized skeletal structures are unremarkable.  IMPRESSION: No active disease.   Electronically Signed   By: Earle Gell M.D.   On: 12/22/2013 17:05   Dg Abd Portable 1v  12/23/2013   CLINICAL DATA:  58 year old female with sepsis, diarrhea. Initial encounter. Pancreatic tumor.  EXAM: PORTABLE ABDOMEN - 1 VIEW  COMPARISON:  CT Abdomen and Pelvis 12/22/2013.  FINDINGS: Portable AP supine view at 1223 hrs. Metallic CBD stent re- identified. Stable, non obstructed bowel gas pattern. Mildly increased retained stool in the distal colon. No acute osseous abnormality identified. No definite pneumoperitoneum on this supine view.  IMPRESSION: 1. Stable, non obstructed bowel gas pattern. 2. Metallic CBD stent, related to pancreatic tumor therapy.   Electronically Signed   By: Lars Pinks M.D.   On: 12/23/2013 12:52   US Abdomen Limited Ruq  01/04/2014   CLINICAL DATA:  Abnormal CT with questionable gallbladder wall thickening, assessment for acute cholecystitis, history of pancreatic malignancy and biliary stenting  EXAM: US ABDOMEN LIMITED - RIGHT UPPER QUADRANT  COMPARISON:  CT abdomen 01/03/2014  FINDINGS: Gallbladder:  Hypoechoic material fills the gallbladder lumen. This material has a minimally echogenic wall. Suspect this represents a large sludge ball with minimal surrounding bile. Associated gallbladder wall thickening up to 4 mm thick. No sonographic Murphy sign. No gallstones identified.  Common bile duct:  Diameter: CBD stent present, difficult to measure lumen.  Liver:  Hypoechoic collection seen adjacent to the fundus the gallbladder, approximately 2.0 cm in greatest size, hypoechoic versus remainder of liver. Suspect this represents a pericholecystic collection, cannot exclude abscess/infection. This corresponds with  the small collection identified adjacent to the fundus of the gallbladder on prior CT which was felt represent a Phrygian cap, though this does not have the typical appearance of a Phrygian cap by ultrasound.  No right upper quadrant free fluid.  IMPRESSION:  Gallbladder wall thickening with large sludge ball within gallbladder.  No definite gallstones identified.  Additionally, pericholecystic collection identified adjacent to the fundus with gallbladder proximally 2 cm in size, cannot exclude abscess.  Differential diagnosis for gallbladder findings includes a calculus cholecystitis, chronic cholecystitis, potentially ascending cholangitis in a patient with an indwelling biliary stent, or less likely potentially post radiation therapy change of the gallbladder (patient post XRT in September 2014), though this would be unlikely to account for a potential pericholecystic collection question abscess. .   Electronically Signed   By: Lavonia Dana M.D.   On: 01/04/2014 15:34    Microbiology: Recent Results (from the past 240 hour(s))  CULTURE, BLOOD (ROUTINE X 2)     Status: None   Collection Time    12/27/13 11:20 AM      Result Value Ref Range Status   Specimen Description BLOOD RIGHT ARM   Final   Special Requests BOTTLES DRAWN AEROBIC AND ANAEROBIC 10 CC   Final   Culture  Setup Time     Final   Value: 12/27/2013 14:30     Performed at [facility]   Culture     Final   Value: NO GROWTH 5 DAYS     Performed at [facility]   Report Status 01/02/2014 FINAL   Final  CULTURE, BLOOD (ROUTINE X 2)     Status: None   Collection Time    12/27/13 11:28 AM      Result Value Ref Range Status   Specimen Description BLOOD RIGHT HAND   Final   Special Requests BOTTLES DRAWN AEROBIC AND ANAEROBIC 5 CC EA   Final   Culture  Setup Time     Final   Value: 12/27/2013 14:30     Performed at [facility]   Culture     Final   Value: NO GROWTH 5 DAYS     Performed at [facility]   Report Status 01/02/2014 FINAL   Final  CULTURE, BLOOD (ROUTINE X 2)     Status: None   Collection Time    12/31/13  9:34 AM      Result Value Ref Range Status   Specimen Description BLOOD LEFT ARM   Final   Special Requests BLOOD 5CC   Final   Culture  Setup Time     Final   Value: 12/31/2013 13:29     Performed at [facility]   Culture     Final   Value:        BLOOD CULTURE RECEIVED NO GROWTH TO DATE CULTURE WILL BE HELD FOR 5 DAYS BEFORE ISSUING A FINAL NEGATIVE REPORT     Performed at [facility]   Report Status PENDING   Incomplete  CULTURE, BLOOD (ROUTINE X 2)     Status: None   Collection Time    12/31/13  9:38 AM      Result Value Ref Range Status   Specimen Description BLOOD LEFT HAND   Final   Special Requests BOTTLES DRAWN AEROBIC AND ANAEROBIC 5CC   Final   Culture  Setup Time     Final   Value: 12/31/2013 13:28     Performed at [facility]   Culture     Final   Value:        BLOOD CULTURE RECEIVED NO GROWTH TO DATE CULTURE WILL BE HELD FOR 5 DAYS BEFORE ISSUING A FINAL NEGATIVE REPORT  Performed at [facility]   Report Status PENDING   Incomplete     Labs: Basic Metabolic Panel:  Recent Labs Lab 12/30/13 0509 12/31/13 0557 01/01/14 0539 01/03/14 0438  NA 137 137 137 140  K 3.2* 3.6* 3.4* 3.5*  CL 102 103 102 105  CO2 23 20 22 23   GLUCOSE 217* 147* 149* 74  BUN 8 12 17 15   CREATININE 0.76 0.87 0.76 0.77  CALCIUM 8.3* 8.3* 9.0 8.8   Liver Function Tests: No results found for this basename: AST, ALT, ALKPHOS, BILITOT, PROT, ALBUMIN,  in the last 168 hours No results found for this basename: LIPASE, AMYLASE,  in the last 168 hours No results found for this basename: AMMONIA,  in the last 168 hours CBC:  Recent Labs Lab 12/30/13 0509 12/31/13 0557 01/01/14 0539 01/03/14 0438  WBC 4.9 16.4* 6.4 6.2  NEUTROABS  --  15.2*  --   --   HGB 8.6* 7.9* 8.2* 8.4*  HCT 27.1* 24.7* 25.7* 26.0*  MCV 88.3  88.2 88.0 87.0  PLT 203 119* 221 260   Cardiac Enzymes: No results found for this basename: CKTOTAL, CKMB, CKMBINDEX, TROPONINI,  in the last 168 hours BNP: BNP (last 3 results) No results found for this basename: PROBNP,  in the last 8760 hours CBG:  Recent Labs Lab 01/04/14 0725 01/04/14 1147 01/04/14 1659 01/04/14 2146 01/05/14 0732  GLUCAP 250* 127* 147* 317* 264*   Signed:  Laray Rivkin K  [facility] 01/05/2014, 11:32 AM

## 2014-01-05 NOTE — Progress Notes (Signed)
Social visit with the patient.  Have arranged for her to follow up with Dr. Barry Dienes as an outpatient for her Adenocarcinoma of the pancreas on 01/21/14 at 10:30am.  She will need to arrive 30 minutes prior to her appointment to fill out paperwork and check in.  Her N/V/abdominal pain is much improved.  She is tolerating a carb modified diet.    Abdomen:  Soft, ND, minimal tenderness in epigastrium   Coralie Keens, PA-C 01/05/2014 11:40am General Surgery [facility] [phone]  Agree with above.  Alphonsa Overall, MD, [facility] Pager: [phone] Office phone:  [phone]

## 2014-01-06 LAB — CULTURE, BLOOD (ROUTINE X 2)
CULTURE: NO GROWTH
Culture: NO GROWTH

## 2014-01-06 MED ORDER — AMLODIPINE BESYLATE 10 MG PO TABS: 10.0000 mg | ORAL_TABLET | Freq: Every day | ORAL | Status: DC

## 2014-01-06 MED ORDER — LISINOPRIL 10 MG PO TABS: 10.0000 mg | ORAL_TABLET | Freq: Every day | ORAL | Status: DC

## 2014-01-06 MED ORDER — OXYCODONE HCL ER 10 MG PO T12A: 10.0000 mg | EXTENDED_RELEASE_TABLET | Freq: Two times a day (BID) | ORAL | Status: DC | PRN

## 2014-01-06 MED ORDER — OXYCODONE-ACETAMINOPHEN 5-325 MG PO TABS: ORAL_TABLET | ORAL | Status: DC

## 2014-01-12 ENCOUNTER — Telehealth: Payer: Self-pay | Admitting: *Deleted

## 2014-01-12 NOTE — Telephone Encounter (Signed)
Call from pt's daughter requesting refill on Oxycodone to be called to pharmacy. Informed her this medication requires written Rx. Noted Rx was written 2/19 for Oxycodone and Oxycontin. Pt's daughter stated they were not given when the left the hospital. They were told they can pick them up at [facility]. She will see if they can get transportation to pick up Rx.

## 2014-01-13 ENCOUNTER — Telehealth: Payer: Self-pay | Admitting: *Deleted

## 2014-01-13 MED ORDER — OXYCODONE-ACETAMINOPHEN 5-325 MG PO TABS: 1.0000 | ORAL_TABLET | Freq: Four times a day (QID) | ORAL | Status: DC | PRN

## 2014-01-13 NOTE — Telephone Encounter (Signed)
Call from pt's daughter reporting they were unable to find narcotic Rx at [facility] hospital. Requesting new pain Rx. Order received. Rx left at front desk for pick up.

## 2014-01-16 DIAGNOSIS — IMO0002 Reserved for concepts with insufficient information to code with codable children: Secondary | ICD-10-CM

## 2014-01-16 HISTORY — DX: Reserved for concepts with insufficient information to code with codable children: IMO0002

## 2014-01-17 ENCOUNTER — Ambulatory Visit: Payer: Medicaid Other

## 2014-01-21 ENCOUNTER — Ambulatory Visit: Payer: Medicaid Other | Admitting: Oncology

## 2014-01-21 ENCOUNTER — Ambulatory Visit (INDEPENDENT_AMBULATORY_CARE_PROVIDER_SITE_OTHER): Payer: Medicaid Other | Admitting: General Surgery

## 2014-01-21 ENCOUNTER — Encounter (INDEPENDENT_AMBULATORY_CARE_PROVIDER_SITE_OTHER): Payer: Self-pay | Admitting: General Surgery

## 2014-01-21 VITALS — BP 140/78 | HR 78 | Temp 98.3°F | Resp 14 | Ht 63.0 in | Wt 153.0 lb

## 2014-01-21 DIAGNOSIS — K55069 Acute infarction of intestine, part and extent unspecified: Secondary | ICD-10-CM

## 2014-01-21 DIAGNOSIS — C259 Malignant neoplasm of pancreas, unspecified: Secondary | ICD-10-CM

## 2014-01-21 DIAGNOSIS — IMO0002 Reserved for concepts with insufficient information to code with codable children: Secondary | ICD-10-CM

## 2014-01-21 DIAGNOSIS — C250 Malignant neoplasm of head of pancreas: Secondary | ICD-10-CM

## 2014-01-21 DIAGNOSIS — K55059 Acute (reversible) ischemia of intestine, part and extent unspecified: Secondary | ICD-10-CM

## 2014-01-21 MED ORDER — OXYCODONE HCL ER 10 MG PO T12A: 10.0000 mg | EXTENDED_RELEASE_TABLET | Freq: Two times a day (BID) | ORAL | Status: DC | PRN

## 2014-01-21 MED ORDER — OXYCODONE-ACETAMINOPHEN 5-325 MG PO TABS: 1.0000 | ORAL_TABLET | Freq: Four times a day (QID) | ORAL | Status: DC | PRN

## 2014-01-21 NOTE — Patient Instructions (Signed)
I will send Dr. Benay Spice a copy of my clinic note.    Follow up as needed.

## 2014-01-21 NOTE — Assessment & Plan Note (Signed)
Significant collateralization of veins around SMV/Porta hepatis  Not a surgical candidate due to this.

## 2014-01-21 NOTE — Progress Notes (Signed)
Chief Complaint  Patient presents with  . [name] [name]    HISTORY: Pt is a 58 yo F seen at the request of Dr. Benay Spice for consultation regarding [name] head [name].  This was diagnosed last fall and she underwent chemoradiation.  She has not had growth of tumor or metastatic disease found, so she is referred for opinion regarding surgical resection.  She is eating "OK."  It is difficult to get a history from her because she is very hard of hearing, and she seems a bit sleepy.  She complains of back pain.  She denies nausea or vomiting.  She was recently admitted to hospital due to strep viridans sepsis.    Past Medical History  Diagnosis Date  . DM (diabetes mellitus), type 2     requires insulin  . Hypertension   . Depression   . Anxiety   . Hyperlipidemia   . Pancreatitis   . GERD (gastroesophageal reflux disease)   . 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 Hearing impaired     lost the last of her hearing aids and cn not get another one.  hearing is better via right ear.   Marland Kitchen History of radiation therapy 07/20/13-08/09/13    Pancreas 37.5Gy  . DDD (degenerative disc disease), lumbar   . Chronic back pain     Past Surgical History  Procedure Laterality Date  . Appendectomy    . Ercp N/A 05/24/2013    Procedure: ENDOSCOPIC RETROGRADE CHOLANGIOPANCREATOGRAPHY (ERCP);  Surgeon: Milus Banister, MD;  Location: [place];  Service: Endoscopy;  Laterality: N/A;  MAC vs. general per anesthesia   . Tubal ligation    . Endoscopic retrograde cholangiopancreatography (ercp) with propofol N/A 10/21/2013    Procedure: ENDOSCOPIC RETROGRADE CHOLANGIOPANCREATOGRAPHY (ERCP) WITH PROPOFOL;  Surgeon: Milus Banister, MD;  Location: [facility];  Service: Endoscopy;  Laterality: N/A;  Stent removal   . Biliary stent placement N/A 10/21/2013    Procedure: BILIARY STENT PLACEMENT;  Surgeon: Milus Banister, MD;  Location: [facility];  Service: Endoscopy;  Laterality: N/A;  .  Tee without cardioversion N/A 12/29/2013    Procedure: TRANSESOPHAGEAL ECHOCARDIOGRAM (TEE);  Surgeon: Dorothy Spark, MD;  Location: [facility] ENDOSCOPY;  Service: Cardiovascular;  Laterality: N/A;    Current Outpatient Prescriptions  Medication Sig Dispense Refill  . amLODipine (NORVASC) 10 MG tablet Take 1 tablet (10 mg total) by mouth daily.  30 tablet  0  . aspirin 81 MG tablet Take 81 mg by mouth daily.      Marland Kitchen gabapentin (NEURONTIN) 300 MG capsule Take 300 mg by mouth 3 (three) times daily.      Marland Kitchen ibuprofen (ADVIL) 200 MG tablet Take 800 mg by mouth every 6 (six) hours as needed for mild pain or moderate pain.       Marland Kitchen insulin aspart (NOVOLOG) 100 UNIT/ML injection Inject 10 Units into the skin 3 (three) times daily with meals.      . insulin glargine (LANTUS) 100 UNIT/ML injection Inject 30 Units into the skin at bedtime.       Marland Kitchen lisinopril (PRINIVIL,ZESTRIL) 10 MG tablet Take 1 tablet (10 mg total) by mouth daily.  30 tablet  0  . lisinopril (PRINIVIL,ZESTRIL) 40 MG tablet 1 tablet by mouth daily  30 tablet  6  . metoprolol (LOPRESSOR) 50 MG tablet Take 1 tablet (50 mg total) by mouth 2 (two) times daily.  60 tablet  0  . oxyCODONE-acetaminophen (PERCOCET/ROXICET) 5-325 MG per  tablet Take 1-2 tablets by mouth every 6 (six) hours as needed for severe pain.  30 tablet  0  . pantoprazole (PROTONIX) 40 MG tablet Take 40 mg by mouth 2 (two) times daily.      . polyethylene glycol (MIRALAX / GLYCOLAX) packet Take 17 g by mouth daily as needed for mild constipation or moderate constipation (constipation).       . potassium chloride (K-DUR,KLOR-CON) 10 MEQ tablet Take 10 mEq by mouth daily.      . simethicone (MYLICON) 384 MG chewable tablet Chew 125 mg by mouth every 6 (six) hours as needed for flatulence.      . simvastatin (ZOCOR) 10 MG tablet Take 10 mg by mouth at bedtime.      . sucralfate (CARAFATE) 1 GM/10ML suspension Take 10 mLs (1 g total) by mouth 2 (two) times daily.  420 mL  11  .  dextrose 5 % SOLN 50 mL with cefTRIAXone 2 G SOLR 2 g Inject 2 g into the vein daily.      Marland Kitchen HYDROmorphone (DILAUDID) 4 MG tablet Take 2-4 mg by mouth every 4 (four) hours as needed for moderate pain or severe pain.      . OxyCODONE (OXYCONTIN) 10 mg T12A 12 hr tablet Take 1 tablet (10 mg total) by mouth 2 (two) times daily as needed (pain).  60 tablet  0  . [DISCONTINUED] enoxaparin (LOVENOX) 80 MG/0.8ML injection Inject 0.7 mLs (70 mg total) into the skin every 12 (twelve) hours.  60 Syringe  1   No current facility-administered medications for this visit.     Allergies  Allergen Reactions  . Betadine [Povidone Iodine] Itching and Rash     Family History  Problem Relation Age of Onset  . Diabetes type II Mother   . Hypertension Mother   . Heart attack Father   . HIV/AIDS Brother      History   Social History  . Marital Status: Single    Spouse Name: N/A    Number of Children: 5  . Years of Education: N/A   Social History Main Topics  . Smoking status: Former Smoker -- 0.50 packs/day for 42 years    Quit date: 05/28/2013  . Smokeless tobacco: Never Used  . Alcohol Use: No  . Drug Use: No  . Sexual Activity: Yes    Birth Control/ Protection: Post-menopausal   Other Topics Concern  . None   Social History Narrative  . None     REVIEW OF SYSTEMS - PERTINENT POSITIVES ONLY: 12 point review of systems negative other than HPI and PMH except for hearing los, joint pain/back pain, weakness, easy bruising, rash.    EXAM: Filed Vitals:   01/21/14 1017  BP: 140/78  Pulse: 78  Temp: 98.3 F (36.8 C)  Resp: 14    Wt Readings from Last 3 Encounters:  01/21/14 153 lb (69.4 kg)  12/25/13 170 lb 13.7 oz (77.5 kg)  12/25/13 170 lb 13.7 oz (77.5 kg)     Gen:  No acute distress.  Well nourished and well groomed.   Neurological: Alert and oriented to person, place, and time. Coordination sl impaired.  Head: Normocephalic and atraumatic.  Eyes: Conjunctivae are  normal. Pupils are equal, round, and reactive to light. No scleral icterus. Proptosis.   Neck: Normal range of motion. Neck supple. No tracheal deviation or thyromegaly present.  Cardiovascular: Normal rate, regular rhythm,  and intact distal pulses.   Respiratory: Effort normal.  No respiratory  distress. No chest wall tenderness.  GI: Soft. The abdomen is soft and nontender.  There is no rebound and no guarding. mild distention Musculoskeletal: Normal range of motion. Extremities are nontender. Back is sore, but not tender.   Lymphadenopathy: No cervical, preauricular, postauricular or axillary adenopathy is present Skin: Skin is warm and dry. No rash noted. No diaphoresis. No erythema. No pallor. No clubbing, cyanosis, or edema.   Psychiatric: Normal mood and affect. Behavior is normal. Judgment and thought content normal.    LABORATORY RESULTS: Available labs are reviewed    Collection Time    01/01/14  5:39 AM      Result Value Ref Range   WBC 6.4  4.0 - 10.5 K/uL   RBC 2.92 (*) 3.87 - 5.11 MIL/uL   Hemoglobin 8.2 (*) 12.0 - 15.0 g/dL   HCT 25.7 (*) 36.0 - 46.0 %   MCV 88.0  78.0 - 100.0 fL   MCH 28.1  26.0 - 34.0 pg   MCHC 31.9  30.0 - 36.0 g/dL   RDW 15.4  11.5 - 15.5 %   Platelets 221  150 - 400 K/uL   Comment: DELTA CHECK NOTED  BASIC METABOLIC PANEL     Status: Abnormal   Collection Time    01/01/14  5:39 AM      Result Value Ref Range   Sodium 137  137 - 147 mEq/L   Potassium 3.4 (*) 3.7 - 5.3 mEq/L   Chloride 102  96 - 112 mEq/L   CO2 22  19 - 32 mEq/L   Glucose, Bld 149 (*) 70 - 99 mg/dL   BUN 17  6 - 23 mg/dL   Creatinine, Ser 0.76  0.50 - 1.10 mg/dL   Calcium 9.0  8.4 - 10.5 mg/dL   GFR calc non Af Amer 90 (*) >90 mL/min   GFR calc Af Amer >90  >90 mL/min   Comment: (NOTE)     The eGFR has been calculated using the CKD EPI equation.     This calculation has not been validated in all clinical situations.     eGFR's persistently <90 mL/min signify possible  Chronic Kidney     Disease.                                                                                                                                                                              Comment 2 Notify RN    CBC     Status: Abnormal   Collection Time    01/03/14  4:38 AM      Result Value Ref Range   WBC 6.2  4.0 - 10.5 K/uL   RBC 2.99 (*) 3.87 -  5.11 MIL/uL   Hemoglobin 8.4 (*) 12.0 - 15.0 g/dL   HCT 26.0 (*) 36.0 - 46.0 %   MCV 87.0  78.0 - 100.0 fL   MCH 28.1  26.0 - 34.0 pg   MCHC 32.3  30.0 - 36.0 g/dL   RDW 15.2  11.5 - 15.5 %   Platelets 260  150 - 400 K/uL  BASIC METABOLIC PANEL     Status: Abnormal   Collection Time    01/03/14  4:38 AM      Result Value Ref Range   Sodium 140  137 - 147 mEq/L   Potassium 3.5 (*) 3.7 - 5.3 mEq/L   Chloride 105  96 - 112 mEq/L   CO2 23  19 - 32 mEq/L   Glucose, Bld 74  70 - 99 mg/dL   BUN 15  6 - 23 mg/dL   Creatinine, Ser 0.77  0.50 - 1.10 mg/dL   Calcium 8.8  8.4 - 10.5 mg/dL   GFR calc non Af Amer 89 (*) >90 mL/min   GFR calc Af Amer >90  >90 mL/min   Comment: (NOTE)     The eGFR has been calculated using the CKD EPI equation.     This calculation has not been validated in all clinical situations.     eGFR's persistently <90 mL/min signify possible Chronic Kidney     Disease.  GLUCOSE, CAPILLARY     Status: Abnormal   Collection Time    01/03/14  7:10 AM      Result Value Ref Range   Glucose-Capillary 121 (*) 70 - 99 mg/dL  GLUCOSE, CAPILLARY     Status: Abnormal   Collection Time    01/03/14 11:29 AM      Result Value Ref Range   Glucose-Capillary 125 (*) 70 - 99 mg/dL  GLUCOSE, CAPILLARY     Status: None   Collection Time    01/03/14  4:50 PM      Result Value Ref Range   Glucose-Capillary 97  70 - 99 mg/dL   Comment 1 Notify RN       RADIOLOGY RESULTS: See E-Chart or I-Site for most recent results.  Images and reports are reviewed.  Dg Chest 2  View  12/31/2013   CLINICAL DATA:  Current history of [name] [name], now presenting with cough, chills, fever and shortness of breath.  EXAM: CHEST  2 VIEW  COMPARISON:  DG CHEST 1V PORT dated 12/22/2013; DG ABD ACUTE W/CHEST dated 11/01/2013; NM PET IMAGE INITIAL (PI) SKULL BASE TO THIGH dated 06/09/2013;  FINDINGS: Cardiac silhouette mildly enlarged but stable. Thoracic aorta mildly tortuous and atherosclerotic, unchanged. Hilar and mediastinal contours otherwise unremarkable. Linear scarring in the lingula, unchanged. Lungs otherwise clear. No localized airspace consolidation. No pleural effusions. No pneumothorax. Normal pulmonary vascularity. Degenerative changes throughout the thoracic spine. Right arm venous catheter tip projects over the expected location of the axillary vein.  IMPRESSION: 1. Stable linear scarring in the lingula. No acute cardiopulmonary disease. 2. Stable mild cardiomegaly without pulmonary edema. 3. Right arm venous catheter tip projects over the expected location of the right axillary vein.   Electronically Signed   By: Evangeline Dakin M.D.   On: 12/31/2013 10:57   Ct Abdomen Pelvis W Contrast  01/03/2014   CLINICAL DATA:  Sepsis, history of [name] malignancy with biliary stent placement, normal white blood cell count  EXAM: CT ABDOMEN AND PELVIS WITH CONTRAST  TECHNIQUE: Multidetector CT imaging of the abdomen and pelvis  was performed using the standard protocol following bolus administration of intravenous contrast.  CONTRAST:  157m OMNIPAQUE IOHEXOL 300 MG/ML SOLN intravenously, the patient also received oral contrast material.  COMPARISON:  DG ABD PORTABLE 1V dated 12/23/2013; CT ABD/PELVIS W CM dated 12/22/2013  FINDINGS: A large caliber biliary stent is in place. There is gas within the biliary tree which is a stable finding. No hepatic masses are demonstrated. The known [name] mass remains poorly defined in the region of the [name] head but has not dramatically  changed since the previous study. There is mild atrophy of the [name] body and tail. The gallbladder wall appears mildly thickened. There may be either a Phrygian cap or a small amount of pericholecystic fluid. When compared to the previous study this reflects a change.  The spleen is not enlarged. There is a stable approximately 2 cm maximal dimension accessory spleen. There are no adrenal masses. The right kidney is normal in appearance. There is an exophytic thick walled appearing 1.9 cm diameter hypodensity associated with the medial aspect of the midpole of the left kidney. It exhibits Hounsfield measurement of +10 immediately post contrast and +6 on delayed images. On delayed images contrast within the renal collecting systems is normal.  The lumbar vertebral bodies are preserved in height. There is disc space narrowing at L4-5. The bony pelvis exhibits no lytic or blastic lesion.  The lung bases exhibit minimal subsegmental atelectasis bilaterally. There is no pleural effusion. The cardiac chambers are enlarged where visualized.  The stomach is nondistended. The duodenum does not appear abnormally distended nor inflamed. The jejunum and ileum are partially contrast filled and exhibit no evidence of obstruction or enteritis. There is a moderate stool burden within the colon without evidence of obstruction. There are no findings to suggest colitis or diverticulitis. There is a small amount of fluid in the right aspect of the pelvis above the uterus. Coarse calcifications within the uterus are consistent with fibroids. The partially distended urinary bladder is normal in appearance. There is no inguinal hernia. There is a small umbilical hernia containing fat.  IMPRESSION: 1. The biliary stent appears to be functioning. Biliary tract gas is present and stable. The poorly defined [name] head mass appears stable. 2. There is questionable gallbladder wall thickening and pericholecystic fluid versus a  Phrygian cap. No stones are evident. Correlation with any symptoms in the right upper quadrant would be useful. Gallbladder ultrasound may be of value. 3. There is a stable exophytic thick walled cyst associated with the mid pole cortex of the left kidney. 4. There is no evidence of bowel obstruction or ileus. 5. There is a small amount of free fluid in the pelvis. Multiple calcified and noncalcified uterine fibroids are suspected.   Electronically Signed   By: David  JMartinique  On: 01/03/2014 19:20   Ct Abdomen Pelvis W Contrast  12/22/2013   CLINICAL DATA:  [name] carcinoma, fever, nausea  EXAM: CT ABDOMEN AND PELVIS WITH CONTRAST  TECHNIQUE: Multidetector CT imaging of the abdomen and pelvis was performed using the standard protocol following bolus administration of intravenous contrast.  CONTRAST:  253mOMNIPAQUE IOHEXOL 300 MG/ML SOLN, 10066mMNIPAQUE IOHEXOL 300 MG/ML SOLN  COMPARISON:  11/01/2013  FINDINGS: Subpleural scarring or dependent atelectasis in the visualized lung bases. Patent metallic biliary stent as indicated by pneumobilia. There is mild central intrahepatic biliary ductal dilatation. Poorly marginated [name] head mass abuts the duodenal sweep. 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 Mild dilatation of  the [name] duct in the body and tail. Mild retroperitoneal peripancreatic inflammatory/ edematous changes as before. No new liver or peritoneal lesion. Unremarkable spleen, adrenal glands, right kidney. 2 cm thick-walled exophytic cystic lesion from the interpolar region left kidney as before. No hydronephrosis. Aortoiliac plaque. Stomach, small bowel, and colon are nondilated. Trace pelvic ascites. Coarse uterine calcification probably degenerative fibroid. Urinary bladder incompletely distended. Degenerative disc and facet disease L4-5.  IMPRESSION: 1. Stable [name] mass with the Little change in appearance of patent metallic biliary stent,  persistent venous occlusion of the portal splenic confluence. 2. Trace pelvic ascites.  No definite distal metastatic disease.   Electronically Signed   By: Arne Cleveland M.D.   On: 12/22/2013 20:52   Dg Chest Port 1 View  12/22/2013   CLINICAL DATA:  Fever. Diabetes and hypertension. Radiation therapy for [name] carcinoma.  EXAM: PORTABLE CHEST - 1 VIEW  COMPARISON:  11/01/2013  FINDINGS: The heart size and mediastinal contours are within normal limits. Both lungs are clear. The visualized skeletal structures are unremarkable.  IMPRESSION: No active disease.   Electronically Signed   By: Earle Gell M.D.   On: 12/22/2013 17:05   Dg Abd Portable 1v  12/23/2013   CLINICAL DATA:  58 year old female with sepsis, diarrhea. Initial encounter. [name] tumor.  EXAM: PORTABLE ABDOMEN - 1 VIEW  COMPARISON:  CT Abdomen and Pelvis 12/22/2013.  FINDINGS: Portable AP supine view at 1223 hrs. Metallic CBD stent re- identified. Stable, non obstructed bowel gas pattern. Mildly increased retained stool in the distal colon. No acute osseous abnormality identified. No definite pneumoperitoneum on this supine view.  IMPRESSION: 1. Stable, non obstructed bowel gas pattern. 2. Metallic CBD stent, related to [name] tumor therapy.   Electronically Signed   By: Lars Pinks M.D.   On: 12/23/2013 12:52   US Abdomen Limited Ruq  01/04/2014   CLINICAL DATA:  Abnormal CT with questionable gallbladder wall thickening, assessment for acute cholecystitis, history of [name] malignancy and biliary stenting  EXAM: US ABDOMEN LIMITED - RIGHT UPPER QUADRANT  COMPARISON:  CT abdomen 01/03/2014  FINDINGS: Gallbladder:  Hypoechoic material fills the gallbladder lumen. This material has a minimally echogenic wall. Suspect this represents a large sludge ball with minimal surrounding bile. Associated gallbladder wall thickening up to 4 mm thick. No sonographic Murphy sign. No gallstones identified.  Common bile duct:  Diameter: CBD stent  present, difficult to measure lumen.  Liver:  Hypoechoic collection seen adjacent to the fundus the gallbladder, approximately 2.0 cm in greatest size, hypoechoic versus remainder of liver. Suspect this represents a pericholecystic collection, cannot exclude abscess/infection. This corresponds with the small collection identified adjacent to the fundus of the gallbladder on prior CT which was felt represent a Phrygian cap, though this does not have the typical appearance of a Phrygian cap by ultrasound.  No right upper quadrant free fluid.  IMPRESSION: Gallbladder wall thickening with large sludge ball within gallbladder.  No definite gallstones identified.  Additionally, pericholecystic collection identified adjacent to the fundus with gallbladder proximally 2 cm in size, cannot exclude abscess.  Differential diagnosis for gallbladder findings includes a calculus cholecystitis, chronic cholecystitis, potentially ascending cholangitis in a patient with an indwelling biliary stent, or less likely potentially post radiation therapy change of the gallbladder (patient post XRT in September 2014), though this would be unlikely to account for a potential pericholecystic collection question abscess. .   Electronically Signed   By: Lavonia Dana M.D.   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   Malignant neoplasm of head of pancreas Not candidate for resection.    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  Recommend continued palliative treatment. May be candidate for celiac block.  Refilled narcotics.  Follow up as needed if palliative treatments required.  Would not recommend any surgical palliation at this point.  No evidence of gastric outlet obstruction.       Milus Height MD Surgical On[facility]      Visit Diagnoses: 1. [name] [name]   2. Superior mesenteric vein thrombosis   3. Malignant neoplasm of head of pancreas     Primary Care Physician: Philis Fendt, MD

## 2014-01-21 NOTE — Assessment & Plan Note (Signed)
Not candidate for resection.    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  Recommend continued palliative treatment. May be candidate for celiac block.  Refilled narcotics.  Follow up as needed if palliative treatments required.  Would not recommend any surgical palliation at this point.  No evidence of gastric outlet obstruction.

## 2014-01-24 ENCOUNTER — Telehealth: Payer: Self-pay | Admitting: *Deleted

## 2014-01-24 ENCOUNTER — Other Ambulatory Visit (HOSPITAL_COMMUNITY): Payer: Medicaid Other

## 2014-01-24 ENCOUNTER — Encounter: Payer: Self-pay | Admitting: Internal Medicine

## 2014-01-24 ENCOUNTER — Encounter: Payer: Self-pay | Admitting: *Deleted

## 2014-01-24 ENCOUNTER — Ambulatory Visit (INDEPENDENT_AMBULATORY_CARE_PROVIDER_SITE_OTHER): Payer: Medicaid Other | Admitting: Internal Medicine

## 2014-01-24 VITALS — BP 171/88 | HR 73 | Temp 97.5°F | Wt 151.0 lb

## 2014-01-24 DIAGNOSIS — A491 Streptococcal infection, unspecified site: Secondary | ICD-10-CM

## 2014-01-24 DIAGNOSIS — R7881 Bacteremia: Secondary | ICD-10-CM

## 2014-01-24 DIAGNOSIS — B955 Unspecified streptococcus as the cause of diseases classified elsewhere: Secondary | ICD-10-CM

## 2014-01-24 NOTE — Telephone Encounter (Signed)
Voice mail from family member requesting appointment. Forwarded message to scheduler-POF already in system to call patient.

## 2014-01-24 NOTE — Telephone Encounter (Signed)
Per Dr Baxter Flattery request called [facility] and advised them to D/C the PICC and pull it as of today 01/24/14. The patient has not had medication since 01/22/14 and will not need to be restarted. Nurse advised the will call the patient and set up a time to go out tomorrow and pull the PICC.

## 2014-01-24 NOTE — Progress Notes (Unsigned)
Note from Dr. Benay Spice that patient needs to see midlevel on 3/23 in morning. POF to scheduler.

## 2014-01-24 NOTE — Progress Notes (Signed)
Subjective:    Patient ID: [name] [name], female    DOB: 01-28-1956, 58 y.o.   MRN: [id number]  HPI [name] [name] is a 58 y.o. 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 She has had abdomen/pelvis 11/01/2013 during a visit to emergency Department for evaluation of abdominal pain. The pancreatic mass was stable. Stable peripancreatic lymph nodes. There was no evidence of metastatic disease. 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   She was admitted on 2/4 for fevers of 104.54F adn altered mental status x 1-2 days, clay colored stools x 2 wks, and , plus one day of diarrhea. On admit, she was started on vancomycin and piptazo. . Infectious work up showed viridans strep ( 2/2 sets). 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 She underwent TEE which was negative for vegetation but did see AV, MV, and TV regurgitation. she was changed to ceftriaxone 2gm IV treated for 28 days due to concern for septic thrombus.   Current Outpatient Prescriptions on File Prior to Visit  Medication Sig Dispense Refill  . amLODipine (NORVASC) 10 MG tablet Take 1 tablet (10 mg total) by mouth daily.  30 tablet  0  . aspirin 81 MG tablet Take 81 mg by mouth daily.      Marland Kitchen gabapentin (NEURONTIN) 300 MG capsule Take 300 mg by mouth 3 (three) times daily.      Marland Kitchen HYDROmorphone (DILAUDID) 4 MG tablet Take 2-4 mg by mouth every 4 (four) hours as needed for moderate pain or severe pain.      Marland Kitchen ibuprofen (ADVIL) 200 MG tablet Take 800 mg by mouth every 6 (six) hours as needed for mild pain or moderate pain.       Marland Kitchen insulin aspart (NOVOLOG) 100  UNIT/ML injection Inject 10 Units into the skin 3 (three) times daily with meals.      . insulin glargine (LANTUS) 100 UNIT/ML injection Inject 30 Units into the skin at bedtime.       Marland Kitchen lisinopril (PRINIVIL,ZESTRIL) 10 MG tablet Take 1 tablet (10 mg total) by mouth daily.  30 tablet  0  . lisinopril (PRINIVIL,ZESTRIL) 40 MG tablet 1 tablet by mouth daily  30 tablet  6  . metoprolol (LOPRESSOR) 50 MG tablet Take 1 tablet (50 mg total) by mouth 2 (two) times daily.  60 tablet  0  . OxyCODONE (OXYCONTIN) 10 mg T12A 12 hr tablet Take 1 tablet (10 mg total) by mouth 2 (two) times daily as needed (pain).  60 tablet  0  . oxyCODONE-acetaminophen (PERCOCET/ROXICET) 5-325 MG per tablet Take 1-2 tablets by mouth every 6 (six) hours as needed for severe pain.  30 tablet  0  . pantoprazole (PROTONIX) 40 MG tablet Take 40 mg by mouth 2 (two) times daily.      . polyethylene glycol (MIRALAX / GLYCOLAX) packet Take 17 g by mouth daily as needed for mild constipation or moderate constipation (constipation).       . potassium chloride (K-DUR,KLOR-CON) 10 MEQ tablet Take 10 mEq by mouth daily.      . simethicone (MYLICON) 660 MG chewable tablet Chew 125 mg by mouth every 6 (six) hours as needed  for flatulence.      . simvastatin (ZOCOR) 10 MG tablet Take 10 mg by mouth at bedtime.      . sucralfate (CARAFATE) 1 GM/10ML suspension Take 10 mLs (1 g total) by mouth 2 (two) times daily.  420 mL  11  . dextrose 5 % SOLN 50 mL with cefTRIAXone 2 G SOLR 2 g Inject 2 g into the vein daily.      . [DISCONTINUED] enoxaparin (LOVENOX) 80 MG/0.8ML injection Inject 0.7 mLs (70 mg total) into the skin every 12 (twelve) hours.  60 Syringe  1   No current facility-administered medications on file prior to visit.   Active Ambulatory Problems    Diagnosis Date Noted  . DM (diabetes mellitus) 05/22/2013  . HTN (hypertension) 05/22/2013  . Dyslipidemia 05/22/2013  . Malignant neoplasm of head of pancreas 06/01/2013  . 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 DOE (dyspnea on exertion) 10/10/2013  . Atypical chest pain 10/12/2013  . Biliary stricture 10/21/2013  . Pancreatic cancer 10/21/2013  . Abdominal pain, epigastric 10/22/2013  . Sepsis 12/22/2013   Resolved Ambulatory Problems    Diagnosis Date Noted  . Obstructive jaundice 05/22/2013  . Chronic pancreatitis 05/22/2013  . Pancreatic mass 05/24/2013  . Pancreatic cancer 05/25/2013  . Nausea 08/12/2013  . Vomiting 08/12/2013  . Abdominal pain 08/12/2013  . Hypokalemia 08/12/2013  . Diabetic neuropathy 08/12/2013  . Abdominal pain 08/12/2013  . Nausea and vomiting 08/12/2013  . Nonspecific (abnormal) findings on radiological and other examination of gastrointestinal tract 08/13/2013   Past Medical History  Diagnosis Date  . DM (diabetes mellitus), type 2   . Hypertension   . Depression   . Anxiety   . Hyperlipidemia   . Pancreatitis   . GERD (gastroesophageal reflux disease)   . Hearing impaired   . History of radiation therapy 07/20/13-08/09/13  . DDD (degenerative disc disease), lumbar   . Chronic back pain       Review of Systems 10 point ROS negative except poor appetite, occassional chills and nightsweats, no diarrhea, fever    Objective:   Physical Exam BP 171/88  Pulse 73  Temp(Src) 97.5 F (36.4 C) (Oral)  Wt 151 lb (68.493 kg) Physical Exam  Constitutional:  oriented to person, place, and time. appears fatigue, and chronically ill. No distress.  HENT:  Mouth/Throat: Oropharynx is clear and moist. No oropharyngeal exudate.  Abdominal: Soft. Bowel sounds are normal.  exhibits no distension. There is no tenderness.  Lymphadenopathy: no cervical adenopathy.  Skin: Skin is warm and dry. No rash noted. No erythema.      Assessment & Plan:  Strep viridans bacteremia = she has finished 28 day course of therapy for complicated bacteremia. wil arrange for home health to pull line  Pancreatic cancer = patient followed by  dr. Benay Spice. Not surgical candidate, being evaluated for ongoing chemo.  rtc prn

## 2014-01-25 ENCOUNTER — Ambulatory Visit: Payer: Medicaid Other | Admitting: Physical Therapy

## 2014-01-25 ENCOUNTER — Telehealth: Payer: Self-pay | Admitting: Oncology

## 2014-01-25 NOTE — Telephone Encounter (Signed)
Talked to pt and gave her appt with ML on 3/24

## 2014-01-28 ENCOUNTER — Ambulatory Visit: Payer: Medicaid Other | Attending: Internal Medicine

## 2014-01-28 DIAGNOSIS — IMO0001 Reserved for inherently not codable concepts without codable children: Secondary | ICD-10-CM | POA: Insufficient documentation

## 2014-01-28 DIAGNOSIS — R262 Difficulty in walking, not elsewhere classified: Secondary | ICD-10-CM | POA: Insufficient documentation

## 2014-01-28 DIAGNOSIS — I69998 Other sequelae following unspecified cerebrovascular disease: Secondary | ICD-10-CM | POA: Insufficient documentation

## 2014-01-28 DIAGNOSIS — R279 Unspecified lack of coordination: Secondary | ICD-10-CM | POA: Insufficient documentation

## 2014-02-07 ENCOUNTER — Telehealth: Payer: Self-pay | Admitting: Gastroenterology

## 2014-02-07 ENCOUNTER — Ambulatory Visit (HOSPITAL_BASED_OUTPATIENT_CLINIC_OR_DEPARTMENT_OTHER): Payer: Medicaid Other | Admitting: Nurse Practitioner

## 2014-02-07 ENCOUNTER — Telehealth: Payer: Self-pay | Admitting: Oncology

## 2014-02-07 VITALS — BP 179/70 | HR 69 | Temp 98.2°F | Resp 18 | Ht 63.0 in | Wt 149.9 lb

## 2014-02-07 DIAGNOSIS — M549 Dorsalgia, unspecified: Secondary | ICD-10-CM

## 2014-02-07 DIAGNOSIS — C259 Malignant neoplasm of pancreas, unspecified: Secondary | ICD-10-CM

## 2014-02-07 DIAGNOSIS — R109 Unspecified abdominal pain: Secondary | ICD-10-CM

## 2014-02-07 DIAGNOSIS — G893 Neoplasm related pain (acute) (chronic): Secondary | ICD-10-CM

## 2014-02-07 DIAGNOSIS — R9431 Abnormal electrocardiogram [ECG] [EKG]: Secondary | ICD-10-CM

## 2014-02-07 DIAGNOSIS — R229 Localized swelling, mass and lump, unspecified: Secondary | ICD-10-CM

## 2014-02-07 DIAGNOSIS — F341 Dysthymic disorder: Secondary | ICD-10-CM

## 2014-02-07 DIAGNOSIS — C250 Malignant neoplasm of head of pancreas: Secondary | ICD-10-CM

## 2014-02-07 MED ORDER — OXYCODONE-ACETAMINOPHEN 5-325 MG PO TABS: 1.0000 | ORAL_TABLET | Freq: Four times a day (QID) | ORAL | Status: DC | PRN

## 2014-02-07 NOTE — Telephone Encounter (Signed)
EUS for celiac plexus neurolysis this Thursday, + linear scope only, ++MAC sedation to follow my other AM case.

## 2014-02-07 NOTE — Progress Notes (Addendum)
OFFICE PROGRESS NOTE  Interval history:  Ms. Wagoner returns for followup of pancreas cancer. She was hospitalized 12/22/2013 through 01/05/2014 with strep viridans bacteremia. She completed a four-week course of ceftriaxone.  She reports increased abdominal and back pain. She is taking Percocet. The pain is "worse some days than others". Legs intermittently feel weak. No bowel or bladder dysfunction. She had nausea, vomiting and diarrhea last week. She continues to have periodic loose stools. She denies fever.   Objective: Filed Vitals:   02/07/14 1212  BP: 179/70  Pulse: 69  Temp: 98.2 F (36.8 C)  Resp: 18   No thrush. Lungs are clear. Regular cardiac rhythm. Abdomen is soft with generalized tenderness. No hepatomegaly. No mass. No leg edema. Motor strength 5 over 5.   Lab Results: Lab Results  Component Value Date   WBC 6.2 01/03/2014   HGB 8.4* 01/03/2014   HCT 26.0* 01/03/2014   MCV 87.0 01/03/2014   PLT 260 01/03/2014   NEUTROABS 15.2* 12/31/2013    Chemistry:    Chemistry      Component Value Date/Time   NA 140 01/03/2014 0438   NA 139 08/17/2013 1451   K 3.5* 01/03/2014 0438   K 3.7 08/17/2013 1451   CL 105 01/03/2014 0438   CO2 23 01/03/2014 0438   CO2 25 08/17/2013 1451   BUN 15 01/03/2014 0438   BUN 11.9 08/17/2013 1451   CREATININE 0.77 01/03/2014 0438   CREATININE 0.8 08/17/2013 1451      Component Value Date/Time   CALCIUM 8.8 01/03/2014 0438   CALCIUM 9.6 08/17/2013 1451   ALKPHOS 176* 12/24/2013 0950   ALKPHOS 172* 08/03/2013 1543   AST 90* 12/24/2013 0950   AST 16 08/03/2013 1543   ALT 64* 12/24/2013 0950   ALT 35 08/03/2013 1543   BILITOT 0.7 12/24/2013 0950   BILITOT 0.56 08/03/2013 1543       Studies/Results: No results found.  Medications: I have reviewed the patient's current medications.  Assessment/Plan: 1. Adenocarcinoma of the pancreas presenting with an obstructing pancreas head mass, clinical stage II versus III (T3-T4, N1); potential abutment of the  celiac axis noted on the MRI abdomen 05/22/2013.   Initiation of radiation and Xeloda 07/20/2013 with completion 08/09/2013.   CT scans abdomen/pelvis done 11/01/2013 for evaluation of increased abdominal pain showed stable infiltrating pancreatic mass. No progression identified. Stable peripancreatic lymph nodes. 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 No evidence of metastatic disease.   Pancreatic head mass stable on CT 01/03/2014. 2. Obstructive jaundice secondary to #1. Status post placement of a bile duct stent on 05/24/2013. Status post placement of a metal stent 10/21/2013. 3. Pain secondary to pancreas cancer.  4. Diabetes. 5. History of chronic pancreatitis. 6. History of alcohol use. 7. History of tobacco use. 8. Anxiety/depression. 9. Cutaneous nodule at the left flank. Question cyst, question metastasis. 10. Abnormal EKG, question Brugada syndrome. 11. Hospitalization 08/12/2013 through 08/15/2013 with nausea/vomiting and abdominal pain. Improved. 12. 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 Portal vein patent. New wall thickening of the mid to distal stomach and the hepatic flexure and proximal transverse colon. 13. 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 She discontinued Lovenox due to abdominal wall ecchymoses and hematomas. 14. Admission 12/22/2013 with strep viridans bacteremia status post 4 week course of ceftriaxone.  Dispositon-she has worsening abdominal and back pain. She will continue Percocet as needed. Dr. Benay Spice recommends a restaging PET scan as well as a referral to Dr. Ardis Hughs for consideration of a celiac nerve block.  She will return for a followup visit on 02/16/2014 to review the PET scan result. She will contact the  office in the interim with any problems.  Patient seen with Dr. Benay Spice. 25 minutes were spent face-to-face at today's visit with the majority of that time involved in counseling/coordination of care.   Ned Card ANP/GNP-BC   This was a shared visit with Ned Card.  She is not a candidate for resection of the pancreas mass. The pain is most likely related to the locally advanced pancreas tumor. We will refer her for a celiac block. We will consider salvage systemic chemotherapy if the pain does not improve with the nerve block. She will be referred for a restaging PET scan to look for evidence of distant metastatic disease.  Julieanne Manson, M.D.

## 2014-02-07 NOTE — Telephone Encounter (Signed)
GV ADN PRINTED APPT SCHED AND AVS FOR aPRIL....GV ADN PRINTED APPT SCHED AND AVS FORPT FOR aPRIL

## 2014-02-07 NOTE — Telephone Encounter (Signed)
Dr Jacobs please review  

## 2014-02-07 NOTE — Telephone Encounter (Signed)
GV ADN PRINTED APPT SCHED AND AVS FORPT FOR aPRIL

## 2014-02-08 ENCOUNTER — Other Ambulatory Visit: Payer: Self-pay

## 2014-02-08 DIAGNOSIS — C259 Malignant neoplasm of pancreas, unspecified: Secondary | ICD-10-CM

## 2014-02-08 DIAGNOSIS — R109 Unspecified abdominal pain: Secondary | ICD-10-CM | POA: Insufficient documentation

## 2014-02-08 DIAGNOSIS — G8929 Other chronic pain: Secondary | ICD-10-CM

## 2014-02-08 DIAGNOSIS — R1013 Epigastric pain: Secondary | ICD-10-CM

## 2014-02-08 NOTE — Telephone Encounter (Signed)
EUS scheduled, pt instructed and medications reviewed.  Patient instructions mailed to home.  Patient to call with any questions or concerns. Pt is not taking anti coag currently Zachery Dakins is aware

## 2014-02-09 ENCOUNTER — Encounter (HOSPITAL_COMMUNITY): Payer: Self-pay | Admitting: Pharmacy Technician

## 2014-02-09 ENCOUNTER — Encounter (HOSPITAL_COMMUNITY): Payer: Self-pay | Admitting: *Deleted

## 2014-02-10 ENCOUNTER — Encounter (HOSPITAL_COMMUNITY): Payer: Self-pay

## 2014-02-10 ENCOUNTER — Ambulatory Visit (HOSPITAL_COMMUNITY)
Admission: RE | Admit: 2014-02-10 | Discharge: 2014-02-10 | Disposition: A | Discharge status: Home / Self Care | Payer: Medicaid Other | Source: Ambulatory Visit | Attending: Gastroenterology | Admitting: Gastroenterology

## 2014-02-10 ENCOUNTER — Encounter (HOSPITAL_COMMUNITY)
Admission: RE | Disposition: A | Discharge status: Home / Self Care | Payer: Medicaid Other | Source: Ambulatory Visit | Attending: Gastroenterology

## 2014-02-10 ENCOUNTER — Ambulatory Visit (HOSPITAL_COMMUNITY): Payer: Medicaid Other | Admitting: Anesthesiology

## 2014-02-10 ENCOUNTER — Encounter (HOSPITAL_COMMUNITY): Payer: Medicaid Other | Admitting: Anesthesiology

## 2014-02-10 DIAGNOSIS — M549 Dorsalgia, unspecified: Secondary | ICD-10-CM | POA: Insufficient documentation

## 2014-02-10 DIAGNOSIS — C259 Malignant neoplasm of pancreas, unspecified: Secondary | ICD-10-CM | POA: Insufficient documentation

## 2014-02-10 DIAGNOSIS — Z86718 Personal history of other venous thrombosis and embolism: Secondary | ICD-10-CM | POA: Insufficient documentation

## 2014-02-10 DIAGNOSIS — F411 Generalized anxiety disorder: Secondary | ICD-10-CM | POA: Insufficient documentation

## 2014-02-10 DIAGNOSIS — R9431 Abnormal electrocardiogram [ECG] [EKG]: Secondary | ICD-10-CM | POA: Insufficient documentation

## 2014-02-10 DIAGNOSIS — F3289 Other specified depressive episodes: Secondary | ICD-10-CM | POA: Insufficient documentation

## 2014-02-10 DIAGNOSIS — G8929 Other chronic pain: Secondary | ICD-10-CM

## 2014-02-10 DIAGNOSIS — R109 Unspecified abdominal pain: Secondary | ICD-10-CM | POA: Insufficient documentation

## 2014-02-10 DIAGNOSIS — Z87891 Personal history of nicotine dependence: Secondary | ICD-10-CM | POA: Insufficient documentation

## 2014-02-10 DIAGNOSIS — K838 Other specified diseases of biliary tract: Secondary | ICD-10-CM | POA: Insufficient documentation

## 2014-02-10 DIAGNOSIS — E119 Type 2 diabetes mellitus without complications: Secondary | ICD-10-CM | POA: Insufficient documentation

## 2014-02-10 DIAGNOSIS — R229 Localized swelling, mass and lump, unspecified: Secondary | ICD-10-CM | POA: Insufficient documentation

## 2014-02-10 DIAGNOSIS — R1013 Epigastric pain: Secondary | ICD-10-CM

## 2014-02-10 DIAGNOSIS — F329 Major depressive disorder, single episode, unspecified: Secondary | ICD-10-CM | POA: Insufficient documentation

## 2014-02-10 HISTORY — PX: EUS: SHX5427

## 2014-02-10 HISTORY — DX: Cerebral infarction, unspecified: I63.9

## 2014-02-10 LAB — GLUCOSE, CAPILLARY: GLUCOSE-CAPILLARY: 147 mg/dL — AB (ref 70–99)

## 2014-02-10 SURGERY — UPPER ENDOSCOPIC ULTRASOUND (EUS) LINEAR
Anesthesia: Monitor Anesthesia Care

## 2014-02-10 MED ORDER — PROPOFOL 10 MG/ML IV EMUL
INTRAVENOUS | Status: DC | PRN
  Administered 2014-02-10: 40 mg via INTRAVENOUS
  Administered 2014-02-10 (×2): 20 mg via INTRAVENOUS

## 2014-02-10 MED ORDER — BUPIVACAINE HCL (PF) 0.25 % IJ SOLN
INTRAMUSCULAR | Status: AC
  Filled 2014-02-10: qty 30

## 2014-02-10 MED ORDER — LIDOCAINE HCL (CARDIAC) 20 MG/ML IV SOLN
INTRAVENOUS | Status: AC
  Filled 2014-02-10: qty 5

## 2014-02-10 MED ORDER — PROPOFOL 10 MG/ML IV BOLUS
INTRAVENOUS | Status: AC
  Filled 2014-02-10: qty 20

## 2014-02-10 MED ORDER — BUPIVACAINE HCL 0.25 % IJ SOLN
20.0000 mL | Freq: Once | INTRAMUSCULAR | Status: DC
  Filled 2014-02-10: qty 20

## 2014-02-10 MED ORDER — LACTATED RINGERS IV SOLN
INTRAVENOUS | Status: DC | PRN
  Administered 2014-02-10: 11:00:00 via INTRAVENOUS

## 2014-02-10 MED ORDER — BUPIVACAINE HCL 0.25 % IJ SOLN
INTRAMUSCULAR | Status: DC | PRN
  Administered 2014-02-10: 20 mL

## 2014-02-10 MED ORDER — PROPOFOL INFUSION 10 MG/ML OPTIME
INTRAVENOUS | Status: DC | PRN
  Administered 2014-02-10: 100 ug/kg/min via INTRAVENOUS

## 2014-02-10 MED ORDER — KETAMINE HCL 10 MG/ML IJ SOLN
INTRAMUSCULAR | Status: DC | PRN
  Administered 2014-02-10 (×2): 10 mg via INTRAVENOUS

## 2014-02-10 MED ORDER — ALCOHOL 98 % IV SOLN
20.0000 mL | Freq: Once | INTRAVENOUS | Status: AC
  Administered 2014-02-10: 20 mL
  Filled 2014-02-10: qty 20

## 2014-02-10 MED ORDER — SODIUM CHLORIDE 0.9 % IV SOLN: INTRAVENOUS | Status: DC

## 2014-02-10 NOTE — Transfer of Care (Signed)
Immediate Anesthesia Transfer of Care Note  Patient: [name] [name]  Procedure(s) Performed: Procedure(s) with comments: UPPER ENDOSCOPIC ULTRASOUND (EUS) LINEAR (N/A) - celiac plexus neurolysis  Patient Location: PACU  Anesthesia Type:MAC  Level of Consciousness: awake, alert  and oriented  Airway & Oxygen Therapy: Patient Spontanous Breathing and Patient connected to nasal cannula oxygen  Post-op Assessment: Report given to PACU RN and Post -op Vital signs reviewed and stable  Post vital signs: Reviewed and stable  Complications: No apparent anesthesia complications

## 2014-02-10 NOTE — Discharge Instructions (Signed)

## 2014-02-10 NOTE — Anesthesia Preprocedure Evaluation (Addendum)
Anesthesia Evaluation  Patient identified by MRN, date of birth, ID band Patient awake    Reviewed: Allergy & Precautions, H&P , NPO status , Patient's Chart, lab work & pertinent test results  Airway Mallampati: II TM Distance: >3 FB Neck ROM: Full    Dental no notable dental hx. (+) Loose,    Pulmonary neg pulmonary ROS, shortness of breath and with exertion, former smoker,  breath sounds clear to auscultation  Pulmonary exam normal       Cardiovascular hypertension, Pt. on medications negative cardio ROS  Rhythm:Regular Rate:Normal  Brugada bsyndrome  Overall Impression:  Normal study.    LV Ejection Fraction: 62%.  LV Wall Motion:  NL LV Function; NL Wall Motion      Neuro/Psych negative neurological ROS  negative psych ROS   GI/Hepatic negative GI ROS, Neg liver ROS, GERD-  ,Pancreatic CA   Endo/Other  negative endocrine ROSdiabetes, Type 1, Insulin Dependent  Renal/GU negative Renal ROS  negative genitourinary   Musculoskeletal negative musculoskeletal ROS (+)   Abdominal   Peds negative pediatric ROS (+)  Hematology negative hematology ROS (+)   Anesthesia Other Findings   Reproductive/Obstetrics negative OB ROS                         Anesthesia Physical Anesthesia Plan  ASA: III  Anesthesia Plan: MAC   Post-op Pain Management:    Induction: Intravenous  Airway Management Planned: Nasal Cannula  Additional Equipment:   Intra-op Plan:   Post-operative Plan:   Informed Consent: I have reviewed the patients History and Physical, chart, labs and discussed the procedure including the risks, benefits and alternatives for the proposed anesthesia with the patient or authorized representative who has indicated his/her understanding and acceptance.   Dental advisory given  Plan Discussed with: CRNA  Anesthesia Plan Comments:         Anesthesia Quick Evaluation                                   Anesthesia Evaluation  Patient identified by MRN, date of birth, ID band Patient awake    Reviewed: Allergy & Precautions, H&P , NPO status , Patient's Chart, lab work & pertinent test results  Airway Mallampati: II TM Distance: >3 FB Neck ROM: Full    Dental no notable dental hx.    Pulmonary shortness of breath and with exertion, former smoker,  breath sounds clear to auscultation  Pulmonary exam normal       Cardiovascular hypertension, Pt. on medications Rhythm:Regular Rate:Normal  Brugada bsyndrome  Overall Impression:  Normal study.    LV Ejection Fraction: 62%.  LV Wall Motion:  NL LV Function; NL Wall Motion      Neuro/Psych negative neurological ROS  negative psych ROS   GI/Hepatic negative GI ROS, Neg liver ROS,   Endo/Other  diabetes, Insulin Dependent  Renal/GU negative Renal ROS  negative genitourinary   Musculoskeletal negative musculoskeletal ROS (+)   Abdominal   Peds negative pediatric ROS (+)  Hematology negative hematology ROS (+)   Anesthesia Other Findings   Reproductive/Obstetrics negative OB ROS                          Anesthesia Physical Anesthesia Plan  ASA: III  Anesthesia Plan: MAC   Post-op Pain Management:    Induction: Intravenous  Airway Management  Planned: Nasal Cannula  Additional Equipment:   Intra-op Plan:   Post-operative Plan:   Informed Consent: I have reviewed the patients History and Physical, chart, labs and discussed the procedure including the risks, benefits and alternatives for the proposed anesthesia with the patient or authorized representative who has indicated his/her understanding and acceptance.   Dental advisory given  Plan Discussed with: CRNA and Surgeon  Anesthesia Plan Comments:         Anesthesia Quick Evaluation

## 2014-02-10 NOTE — Op Note (Signed)
[facility] [place], [id number]   ENDOSCOPIC ULTRASOUND PROCEDURE REPORT  PATIENT: [name] [name]  MR#: [id number] BIRTHDATE: 21-Nov-1955  GENDER: Female ENDOSCOPIST: Milus Banister, MD PROCEDURE DATE:  02/10/2014 PROCEDURE:   Upper EUS and Celiac plexus block ASA CLASS:      Class III INDICATIONS:   1.  locally advanced pancreatic cancer, signficant abdominal and back pain. MEDICATIONS: MAC sedation, administered by CRNA  DESCRIPTION OF PROCEDURE:   After the risks benefits and alternatives of the procedure were  explained, informed consent was obtained. The patient was then placed in the left, lateral, decubitus postion and IV sedation was administered. Throughout the procedure, the patients blood pressure, pulse and oxygen saturations were monitored continuously.  Under direct visualization, the PENTAX EUS SCOPE  endoscope was introduced through the mouth  and advanced to the stomach body .  Water was used as necessary to provide an acoustic interface.  Upon completion of the imaging, water was removed and the patient was sent to the recovery room in satisfactory condition.   Endoscopic findings (limited exam with linear echoendoscope); 1. Normal esophagus, stomach  EUS findings: 1. The aorto-celiac anatomy was clearly identified.  Using a 67 guage EUS FNA needle, the right side of celiac artery take off was injected with 5cc sterile saline, followed by 10cc bupicicaine, followed by 10cc of 98% alcohol in usual fashion.  The same procedure was then performed to the left side of the celiac artery takeoff.  Impression: Celiac plexus neurolysis was performed for pancreatic cancer related abdominal and back pain.  She will likely notice that she requires less (none?) narcotic pain medicines for her pain.   _______________________________ eSigned:  Milus Banister, MD 02/10/2014 12:50 PM   cc: Julieanne Manson, MD; Leighton Ruff, MD

## 2014-02-10 NOTE — Anesthesia Postprocedure Evaluation (Signed)
Anesthesia Post Note  Patient: [name] [name]  Procedure(s) Performed: Procedure(s) (LRB): UPPER ENDOSCOPIC ULTRASOUND (EUS) LINEAR (N/A)  Anesthesia type: MAC  Patient location: PACU  Post pain: Pain level controlled  Post assessment: Post-op Vital signs reviewed  Last Vitals:  Filed Vitals:   02/10/14 1257  BP: 155/80  Pulse:   Temp:   Resp: 18    Post vital signs: Reviewed  Level of consciousness: sedated  Complications: No apparent anesthesia complications

## 2014-02-10 NOTE — H&P (View-Only) (Signed)
OFFICE PROGRESS NOTE  Interval history:  Ms. Moxon returns for followup of pancreas cancer. She was hospitalized 12/22/2013 through 01/05/2014 with strep viridans bacteremia. She completed a four-week course of ceftriaxone.  She reports increased abdominal and back pain. She is taking Percocet. The pain is "worse some days than others". Legs intermittently feel weak. No bowel or bladder dysfunction. She had nausea, vomiting and diarrhea last week. She continues to have periodic loose stools. She denies fever.   Objective: Filed Vitals:   02/07/14 1212  BP: 179/70  Pulse: 69  Temp: 98.2 F (36.8 C)  Resp: 18   No thrush. Lungs are clear. Regular cardiac rhythm. Abdomen is soft with generalized tenderness. No hepatomegaly. No mass. No leg edema. Motor strength 5 over 5.   Lab Results: Lab Results  Component Value Date   WBC 6.2 01/03/2014   HGB 8.4* 01/03/2014   HCT 26.0* 01/03/2014   MCV 87.0 01/03/2014   PLT 260 01/03/2014   NEUTROABS 15.2* 12/31/2013    Chemistry:    Chemistry      Component Value Date/Time   NA 140 01/03/2014 0438   NA 139 08/17/2013 1451   K 3.5* 01/03/2014 0438   K 3.7 08/17/2013 1451   CL 105 01/03/2014 0438   CO2 23 01/03/2014 0438   CO2 25 08/17/2013 1451   BUN 15 01/03/2014 0438   BUN 11.9 08/17/2013 1451   CREATININE 0.77 01/03/2014 0438   CREATININE 0.8 08/17/2013 1451      Component Value Date/Time   CALCIUM 8.8 01/03/2014 0438   CALCIUM 9.6 08/17/2013 1451   ALKPHOS 176* 12/24/2013 0950   ALKPHOS 172* 08/03/2013 1543   AST 90* 12/24/2013 0950   AST 16 08/03/2013 1543   ALT 64* 12/24/2013 0950   ALT 35 08/03/2013 1543   BILITOT 0.7 12/24/2013 0950   BILITOT 0.56 08/03/2013 1543       Studies/Results: No results found.  Medications: I have reviewed the patient's current medications.  Assessment/Plan: 1. Adenocarcinoma of the pancreas presenting with an obstructing pancreas head mass, clinical stage II versus III (T3-T4, N1); potential abutment of the  celiac axis noted on the MRI abdomen 05/22/2013.   Initiation of radiation and Xeloda 07/20/2013 with completion 08/09/2013.   CT scans abdomen/pelvis done 11/01/2013 for evaluation of increased abdominal pain showed stable infiltrating pancreatic mass. No progression identified. Stable peripancreatic lymph nodes. 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 No evidence of metastatic disease.   Pancreatic head mass stable on CT 01/03/2014. 2. Obstructive jaundice secondary to #1. Status post placement of a bile duct stent on 05/24/2013. Status post placement of a metal stent 10/21/2013. 3. Pain secondary to pancreas cancer.  4. Diabetes. 5. History of chronic pancreatitis. 6. History of alcohol use. 7. History of tobacco use. 8. Anxiety/depression. 9. Cutaneous nodule at the left flank. Question cyst, question metastasis. 10. Abnormal EKG, question Brugada syndrome. 11. Hospitalization 08/12/2013 through 08/15/2013 with nausea/vomiting and abdominal pain. Improved. 12. 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 Portal vein patent. New wall thickening of the mid to distal stomach and the hepatic flexure and proximal transverse colon. 13. 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 She discontinued Lovenox due to abdominal wall ecchymoses and hematomas. 14. Admission 12/22/2013 with strep viridans bacteremia status post 4 week course of ceftriaxone.  Dispositon-she has worsening abdominal and back pain. She will continue Percocet as needed. Dr. Benay Spice recommends a restaging PET scan as well as a referral to Dr. Ardis Hughs for consideration of a celiac nerve block.  She will return for a followup visit on 02/16/2014 to review the PET scan result. She will contact the  office in the interim with any problems.  Patient seen with Dr. Benay Spice. 25 minutes were spent face-to-face at today's visit with the majority of that time involved in counseling/coordination of care.   Ned Card ANP/GNP-BC   This was a shared visit with Ned Card.  She is not a candidate for resection of the pancreas mass. The pain is most likely related to the locally advanced pancreas tumor. We will refer her for a celiac block. We will consider salvage systemic chemotherapy if the pain does not improve with the nerve block. She will be referred for a restaging PET scan to look for evidence of distant metastatic disease.  Julieanne Manson, M.D.

## 2014-02-10 NOTE — Interval H&P Note (Signed)
History and Physical Interval Note:  02/10/2014 11:46 AM  [name] [name]  has presented today for surgery, with the diagnosis of Pancreatic cancer [157.9] Abdominal pain [789.00]  The various methods of treatment have been discussed with the patient and family. After consideration of risks, benefits and other options for treatment, the patient has consented to  Procedure(s) with comments: UPPER ENDOSCOPIC ULTRASOUND (EUS) LINEAR (N/A) - celiac plexus neurolysis as a surgical intervention .  The patient's history has been reviewed, patient examined, no change in status, stable for surgery.  I have reviewed the patient's chart and labs.  Questions were answered to the patient's satisfaction.     Milus Banister

## 2014-02-11 ENCOUNTER — Encounter (HOSPITAL_COMMUNITY): Payer: Self-pay | Admitting: Gastroenterology

## 2014-02-15 ENCOUNTER — Ambulatory Visit (HOSPITAL_COMMUNITY): Payer: Medicaid Other | Attending: Cardiology | Admitting: Radiology

## 2014-02-15 DIAGNOSIS — Z87891 Personal history of nicotine dependence: Secondary | ICD-10-CM | POA: Diagnosis not present

## 2014-02-15 DIAGNOSIS — E785 Hyperlipidemia, unspecified: Secondary | ICD-10-CM | POA: Insufficient documentation

## 2014-02-15 DIAGNOSIS — C259 Malignant neoplasm of pancreas, unspecified: Secondary | ICD-10-CM | POA: Insufficient documentation

## 2014-02-15 DIAGNOSIS — I319 Disease of pericardium, unspecified: Secondary | ICD-10-CM | POA: Diagnosis not present

## 2014-02-15 DIAGNOSIS — E119 Type 2 diabetes mellitus without complications: Secondary | ICD-10-CM | POA: Insufficient documentation

## 2014-02-15 DIAGNOSIS — I10 Essential (primary) hypertension: Secondary | ICD-10-CM | POA: Diagnosis not present

## 2014-02-15 DIAGNOSIS — J90 Pleural effusion, not elsewhere classified: Secondary | ICD-10-CM | POA: Diagnosis not present

## 2014-02-15 NOTE — Progress Notes (Signed)
Limited Echocardiogram Performed.

## 2014-02-16 ENCOUNTER — Ambulatory Visit (HOSPITAL_BASED_OUTPATIENT_CLINIC_OR_DEPARTMENT_OTHER): Payer: Medicaid Other | Admitting: Oncology

## 2014-02-16 ENCOUNTER — Telehealth: Payer: Self-pay | Admitting: Oncology

## 2014-02-16 ENCOUNTER — Ambulatory Visit (HOSPITAL_BASED_OUTPATIENT_CLINIC_OR_DEPARTMENT_OTHER): Payer: Medicaid Other | Admitting: Nurse Practitioner

## 2014-02-16 ENCOUNTER — Encounter: Payer: Self-pay | Admitting: *Deleted

## 2014-02-16 VITALS — BP 106/66 | HR 75 | Temp 98.3°F | Resp 18 | Ht 63.0 in | Wt 150.4 lb

## 2014-02-16 DIAGNOSIS — I81 Portal vein thrombosis: Secondary | ICD-10-CM

## 2014-02-16 DIAGNOSIS — I82890 Acute embolism and thrombosis of other specified veins: Secondary | ICD-10-CM

## 2014-02-16 DIAGNOSIS — C250 Malignant neoplasm of head of pancreas: Secondary | ICD-10-CM

## 2014-02-16 DIAGNOSIS — R9431 Abnormal electrocardiogram [ECG] [EKG]: Secondary | ICD-10-CM

## 2014-02-16 DIAGNOSIS — C259 Malignant neoplasm of pancreas, unspecified: Secondary | ICD-10-CM

## 2014-02-16 DIAGNOSIS — H9201 Otalgia, right ear: Secondary | ICD-10-CM

## 2014-02-16 DIAGNOSIS — G893 Neoplasm related pain (acute) (chronic): Secondary | ICD-10-CM

## 2014-02-16 DIAGNOSIS — F341 Dysthymic disorder: Secondary | ICD-10-CM

## 2014-02-16 DIAGNOSIS — R229 Localized swelling, mass and lump, unspecified: Secondary | ICD-10-CM

## 2014-02-16 DIAGNOSIS — R197 Diarrhea, unspecified: Secondary | ICD-10-CM

## 2014-02-16 MED ORDER — PANCRELIPASE (LIP-PROT-AMYL) 12000-38000 UNITS PO CPEP
1.0000 | ORAL_CAPSULE | Freq: Three times a day (TID) | ORAL | Status: DC
Start: 2014-02-16 — End: 2014-09-06

## 2014-02-16 NOTE — Telephone Encounter (Signed)
gv and printed aptp sched and avs for pt for April...pt needs to call [facility] on medicaid card and have them refer her to ENT...pt ok and aware

## 2014-02-16 NOTE — Progress Notes (Addendum)
[facility] OFFICE PROGRESS NOTE   Diagnosis:  Pancreas cancer.  INTERVAL HISTORY:   [name] [name] returns as scheduled. She underwent a celiac plexus nerve block by Dr. Ardis Hughs on 02/10/2014. She thinks her pain is some better. She is no longer taking Percocet because it makes her tired and she has a lot of appointments to go to. Advil will typically relieve the pain. She denies nausea/vomiting. She continues to have diarrhea. The stools have a fatty appearance. She tends to have bowel movements soon after eating. She complains of pain in the right ear and has had some bleeding. She also reports hearing loss.  Objective:  Vital signs in last 24 hours:  Blood pressure 106/66, pulse 75, temperature 98.3 F (36.8 C), temperature source Oral, resp. rate 18, height 5\' 3"  (1.6 m), weight 150 lb 6.4 oz (68.221 kg), SpO2 100.00%.    HEENT: No thrush or ulcerations. Resp: Lungs clear. Cardio: Regular cardiac rhythm. GI: Abdomen is soft. Tender at the epigastric region. No hepatomegaly. Vascular: No leg edema.  Lab Results:  Lab Results  Component Value Date   WBC 6.2 01/03/2014   HGB 8.4* 01/03/2014   HCT 26.0* 01/03/2014   MCV 87.0 01/03/2014   PLT 260 01/03/2014   NEUTROABS 15.2* 12/31/2013      Imaging:  No results found.  Medications: I have reviewed the patient's current medications.  Assessment/Plan: 1. 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  Initiation of radiation and Xeloda 07/20/2013 with completion 08/09/2013.  CT scans abdomen/pelvis done 11/01/2013 for evaluation of increased abdominal pain showed stable infiltrating pancreatic mass. No progression identified. Stable peripancreatic lymph nodes. 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 No evidence of metastatic disease.  Pancreatic head mass stable on CT  01/03/2014. 2. Obstructive jaundice secondary to #1. Status post placement of a bile duct stent on 05/24/2013. Status post placement of a metal stent 10/21/2013. 3. Pain secondary to pancreas cancer.  4. Diabetes. 5. History of chronic pancreatitis. 6. History of alcohol use. 7. History of tobacco use. 8. Anxiety/depression. 9. Cutaneous nodule at the left flank. Question cyst, question metastasis. 10. Abnormal EKG, question Brugada syndrome. 11. Hospitalization 08/12/2013 through 08/15/2013 with nausea/vomiting and abdominal pain. Improved. 12. 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 Portal vein patent. New wall thickening of the mid to distal stomach and the hepatic flexure and proximal transverse colon. 13. 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 She discontinued Lovenox due to abdominal wall ecchymoses and hematomas. 14. Admission 12/22/2013 with strep viridans bacteremia status post 4 week course of ceftriaxone.  15. Status post celiac plexus nerve block 02/10/2014. She is taking Advil for pain. 16. 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 She will begin Pancrease 12,000 units one tablet 3 times a day prior to meals.   Disposition: She appears stable. The abdominal/back pain is some better following the nerve block. She is no longer taking narcotics.  She will begin Pancrease as above for diarrhea.  We will see her back in 3-4 weeks for reevaluation. She understands to contact the office in the interim with any problems.  Patient seen with Dr. Benay Spice.    [name] [name] ANP/GNP-BC  02/16/2014  2:47 PM This was a shared visit with [name] [name]. Her insurance has refused a staging PET scan. It is very likely the pain is  related to the locally advanced pancreas cancer. She will continue ibuprofen and oxycodone as needed for pain.it appears the celiac block has helped partially. We added pancreatic enzyme replacement.   She will return for an office visit in 3-4 weeks. The plan is to proceed with gemcitabine/Abraxane chemotherapy for increased pain.  [name] [name], M.D.

## 2014-02-17 ENCOUNTER — Ambulatory Visit (HOSPITAL_COMMUNITY): Payer: Medicaid Other

## 2014-02-17 LAB — CANCER ANTIGEN 19-9: CA 19-9: 284.2 U/mL — ABNORMAL HIGH (ref <35.0)

## 2014-02-18 ENCOUNTER — Encounter: Payer: Self-pay | Admitting: *Deleted

## 2014-02-18 NOTE — Progress Notes (Signed)
[facility] Work  Clinical Social Work was referred by radiation for assessment of psychosocial needs due to living situation concerns.  Clinical Social Worker attempted to contact patient via phone and left message to return CSW call in order to offer support and further assess for needs.    Loren Racer, LCSW Clinical Social Worker [facility] Wednesday, Thursday and Friday Phone: [phone] Fax: [phone]

## 2014-02-22 NOTE — Progress Notes (Deleted)
Patient ID: [name] [name], female   DOB: [birth year], 58 y.o.   MRN: [id number]

## 2014-02-25 ENCOUNTER — Encounter (HOSPITAL_COMMUNITY): Payer: Medicaid Other

## 2014-03-01 ENCOUNTER — Telehealth: Payer: Self-pay | Admitting: *Deleted

## 2014-03-01 NOTE — Telephone Encounter (Signed)
Called to inquire on status of MD signature on forms from [employer] that were supposed to be sent to office after visit on 02/16/14. Made her aware there is no record of Dr. Benay Spice seeing or signing these forms. Requested she have them re-faxed to [phone]. After they are sent back, a copy will be made for her records to pick up.

## 2014-03-03 ENCOUNTER — Telehealth: Payer: Self-pay | Admitting: *Deleted

## 2014-03-03 NOTE — Telephone Encounter (Signed)
Call from pt's daughter to follow up on form from the housing authority. Requests we fax it when complete.

## 2014-03-04 ENCOUNTER — Telehealth: Payer: Self-pay | Admitting: *Deleted

## 2014-03-04 NOTE — Telephone Encounter (Signed)
Call from pt's daughter requesting housing authority form to be re-faxed. Called office, confirmed fax # [phone]. Forms re-faxed.

## 2014-03-04 NOTE — Telephone Encounter (Signed)
Late entry for 03/03/14 1700: Forms faxed to housing authority. Called pt's daughter today to make her aware.

## 2014-03-07 ENCOUNTER — Inpatient Hospital Stay (HOSPITAL_COMMUNITY): Payer: Medicaid Other

## 2014-03-07 ENCOUNTER — Emergency Department (HOSPITAL_COMMUNITY): Payer: Medicaid Other

## 2014-03-07 ENCOUNTER — Inpatient Hospital Stay (HOSPITAL_COMMUNITY)
Admission: EM | Admit: 2014-03-07 | Discharge: 2014-03-09 | DRG: 947 | Disposition: A | Discharge status: Home / Self Care | Payer: Medicaid Other | Attending: Internal Medicine | Admitting: Internal Medicine

## 2014-03-07 ENCOUNTER — Encounter (HOSPITAL_COMMUNITY): Payer: Self-pay | Admitting: Emergency Medicine

## 2014-03-07 DIAGNOSIS — C259 Malignant neoplasm of pancreas, unspecified: Secondary | ICD-10-CM

## 2014-03-07 DIAGNOSIS — R7989 Other specified abnormal findings of blood chemistry: Secondary | ICD-10-CM

## 2014-03-07 DIAGNOSIS — R945 Abnormal results of liver function studies: Secondary | ICD-10-CM

## 2014-03-07 DIAGNOSIS — K831 Obstruction of bile duct: Secondary | ICD-10-CM

## 2014-03-07 DIAGNOSIS — I10 Essential (primary) hypertension: Secondary | ICD-10-CM

## 2014-03-07 DIAGNOSIS — H9190 Unspecified hearing loss, unspecified ear: Secondary | ICD-10-CM | POA: Diagnosis present

## 2014-03-07 DIAGNOSIS — F411 Generalized anxiety disorder: Secondary | ICD-10-CM | POA: Diagnosis present

## 2014-03-07 DIAGNOSIS — Z833 Family history of diabetes mellitus: Secondary | ICD-10-CM

## 2014-03-07 DIAGNOSIS — F329 Major depressive disorder, single episode, unspecified: Secondary | ICD-10-CM | POA: Diagnosis present

## 2014-03-07 DIAGNOSIS — Z9851 Tubal ligation status: Secondary | ICD-10-CM

## 2014-03-07 DIAGNOSIS — R7401 Elevation of levels of liver transaminase levels: Secondary | ICD-10-CM

## 2014-03-07 DIAGNOSIS — E1169 Type 2 diabetes mellitus with other specified complication: Secondary | ICD-10-CM | POA: Diagnosis present

## 2014-03-07 DIAGNOSIS — Z87891 Personal history of nicotine dependence: Secondary | ICD-10-CM

## 2014-03-07 DIAGNOSIS — Z794 Long term (current) use of insulin: Secondary | ICD-10-CM

## 2014-03-07 DIAGNOSIS — R630 Anorexia: Secondary | ICD-10-CM | POA: Diagnosis present

## 2014-03-07 DIAGNOSIS — R7402 Elevation of levels of lactic acid dehydrogenase (LDH): Secondary | ICD-10-CM

## 2014-03-07 DIAGNOSIS — E785 Hyperlipidemia, unspecified: Secondary | ICD-10-CM

## 2014-03-07 DIAGNOSIS — Z9089 Acquired absence of other organs: Secondary | ICD-10-CM

## 2014-03-07 DIAGNOSIS — Z79899 Other long term (current) drug therapy: Secondary | ICD-10-CM

## 2014-03-07 DIAGNOSIS — R740 Nonspecific elevation of levels of transaminase and lactic acid dehydrogenase [LDH]: Secondary | ICD-10-CM

## 2014-03-07 DIAGNOSIS — F3289 Other specified depressive episodes: Secondary | ICD-10-CM | POA: Diagnosis present

## 2014-03-07 DIAGNOSIS — E119 Type 2 diabetes mellitus without complications: Secondary | ICD-10-CM

## 2014-03-07 DIAGNOSIS — Z8673 Personal history of transient ischemic attack (TIA), and cerebral infarction without residual deficits: Secondary | ICD-10-CM

## 2014-03-07 DIAGNOSIS — K219 Gastro-esophageal reflux disease without esophagitis: Secondary | ICD-10-CM | POA: Diagnosis present

## 2014-03-07 DIAGNOSIS — K920 Hematemesis: Secondary | ICD-10-CM

## 2014-03-07 DIAGNOSIS — R6881 Early satiety: Secondary | ICD-10-CM | POA: Diagnosis present

## 2014-03-07 DIAGNOSIS — R0789 Other chest pain: Secondary | ICD-10-CM

## 2014-03-07 DIAGNOSIS — C250 Malignant neoplasm of head of pancreas: Secondary | ICD-10-CM

## 2014-03-07 DIAGNOSIS — R109 Unspecified abdominal pain: Secondary | ICD-10-CM

## 2014-03-07 DIAGNOSIS — G893 Neoplasm related pain (acute) (chronic): Principal | ICD-10-CM | POA: Diagnosis present

## 2014-03-07 DIAGNOSIS — Z8249 Family history of ischemic heart disease and other diseases of the circulatory system: Secondary | ICD-10-CM

## 2014-03-07 DIAGNOSIS — D649 Anemia, unspecified: Secondary | ICD-10-CM | POA: Diagnosis present

## 2014-03-07 DIAGNOSIS — F1020 Alcohol dependence, uncomplicated: Secondary | ICD-10-CM | POA: Diagnosis present

## 2014-03-07 HISTORY — DX: Reserved for concepts with insufficient information to code with codable children: IMO0002

## 2014-03-07 HISTORY — DX: Anemia, unspecified: D64.9

## 2014-03-07 LAB — CBC WITH DIFFERENTIAL/PLATELET
Basophils Absolute: 0 10*3/uL (ref 0.0–0.1)
Basophils Relative: 0 % (ref 0–1)
Eosinophils Absolute: 0.2 10*3/uL (ref 0.0–0.7)
Eosinophils Relative: 4 % (ref 0–5)
HEMATOCRIT: 34 % — AB (ref 36.0–46.0)
Hemoglobin: 10.8 g/dL — ABNORMAL LOW (ref 12.0–15.0)
LYMPHS PCT: 10 % — AB (ref 12–46)
Lymphs Abs: 0.6 10*3/uL — ABNORMAL LOW (ref 0.7–4.0)
MCH: 26.2 pg (ref 26.0–34.0)
MCHC: 31.8 g/dL (ref 30.0–36.0)
MCV: 82.3 fL (ref 78.0–100.0)
MONO ABS: 0.6 10*3/uL (ref 0.1–1.0)
MONOS PCT: 11 % (ref 3–12)
Neutro Abs: 4.3 10*3/uL (ref 1.7–7.7)
Neutrophils Relative %: 75 % (ref 43–77)
Platelets: 184 10*3/uL (ref 150–400)
RBC: 4.13 MIL/uL (ref 3.87–5.11)
RDW: 16.8 % — ABNORMAL HIGH (ref 11.5–15.5)
WBC: 5.7 10*3/uL (ref 4.0–10.5)

## 2014-03-07 LAB — URINE MICROSCOPIC-ADD ON

## 2014-03-07 LAB — COMPREHENSIVE METABOLIC PANEL
ALT: 174 U/L — ABNORMAL HIGH (ref 0–35)
AST: 200 U/L — AB (ref 0–37)
Albumin: 3.5 g/dL (ref 3.5–5.2)
Alkaline Phosphatase: 1135 U/L — ABNORMAL HIGH (ref 39–117)
BILIRUBIN TOTAL: 1.2 mg/dL (ref 0.3–1.2)
BUN: 16 mg/dL (ref 6–23)
CO2: 22 meq/L (ref 19–32)
CREATININE: 0.68 mg/dL (ref 0.50–1.10)
Calcium: 9.8 mg/dL (ref 8.4–10.5)
Chloride: 101 mEq/L (ref 96–112)
Glucose, Bld: 172 mg/dL — ABNORMAL HIGH (ref 70–99)
Potassium: 4 mEq/L (ref 3.7–5.3)
Sodium: 139 mEq/L (ref 137–147)
Total Protein: 8.8 g/dL — ABNORMAL HIGH (ref 6.0–8.3)

## 2014-03-07 LAB — GLUCOSE, CAPILLARY
GLUCOSE-CAPILLARY: 171 mg/dL — AB (ref 70–99)
GLUCOSE-CAPILLARY: 74 mg/dL (ref 70–99)
GLUCOSE-CAPILLARY: 183 mg/dL — AB (ref 70–99)
Glucose-Capillary: 63 mg/dL — ABNORMAL LOW (ref 70–99)

## 2014-03-07 LAB — URINALYSIS, ROUTINE W REFLEX MICROSCOPIC
Glucose, UA: NEGATIVE mg/dL
HGB URINE DIPSTICK: NEGATIVE
Ketones, ur: 15 mg/dL — AB
Nitrite: NEGATIVE
PROTEIN: 30 mg/dL — AB
Specific Gravity, Urine: 1.025 (ref 1.005–1.030)
UROBILINOGEN UA: 2 mg/dL — AB (ref 0.0–1.0)
pH: 6 (ref 5.0–8.0)

## 2014-03-07 LAB — HEMOGLOBIN A1C
HEMOGLOBIN A1C: 7 % — AB (ref <5.7)
Mean Plasma Glucose: 154 mg/dL — ABNORMAL HIGH (ref <117)

## 2014-03-07 LAB — LIPASE, BLOOD: LIPASE: 9 U/L — AB (ref 11–59)

## 2014-03-07 MED ORDER — METOPROLOL TARTRATE 25 MG PO TABS
25.0000 mg | ORAL_TABLET | Freq: Two times a day (BID) | ORAL | Status: DC
  Administered 2014-03-07 – 2014-03-09 (×4): 25 mg via ORAL
  Filled 2014-03-07 (×6): qty 1

## 2014-03-07 MED ORDER — PANTOPRAZOLE SODIUM 40 MG PO TBEC
40.0000 mg | DELAYED_RELEASE_TABLET | Freq: Every day | ORAL | Status: DC
  Administered 2014-03-07 – 2014-03-09 (×3): 40 mg via ORAL
  Filled 2014-03-07 (×3): qty 1

## 2014-03-07 MED ORDER — ONDANSETRON HCL 4 MG/2ML IJ SOLN
4.0000 mg | Freq: Once | INTRAMUSCULAR | Status: AC
Start: 2014-03-07 — End: 2014-03-07
  Administered 2014-03-07: 4 mg via INTRAVENOUS
  Filled 2014-03-07: qty 2

## 2014-03-07 MED ORDER — METOCLOPRAMIDE HCL 5 MG PO TABS
5.0000 mg | ORAL_TABLET | Freq: Four times a day (QID) | ORAL | Status: DC
  Administered 2014-03-07 – 2014-03-09 (×6): 5 mg via ORAL
  Filled 2014-03-07 (×12): qty 1

## 2014-03-07 MED ORDER — PANCRELIPASE (LIP-PROT-AMYL) 12000-38000 UNITS PO CPEP
1.0000 | ORAL_CAPSULE | Freq: Three times a day (TID) | ORAL | Status: DC
  Administered 2014-03-07 – 2014-03-09 (×4): 1 via ORAL
  Filled 2014-03-07 (×10): qty 1

## 2014-03-07 MED ORDER — ONDANSETRON HCL 4 MG/2ML IJ SOLN: 4.0000 mg | Freq: Three times a day (TID) | INTRAMUSCULAR | Status: AC | PRN

## 2014-03-07 MED ORDER — HYDROMORPHONE HCL PF 1 MG/ML IJ SOLN
1.0000 mg | Freq: Once | INTRAMUSCULAR | Status: AC
  Administered 2014-03-07: 1 mg via INTRAVENOUS
  Filled 2014-03-07: qty 1

## 2014-03-07 MED ORDER — INSULIN ASPART 100 UNIT/ML ~~LOC~~ SOLN
10.0000 [IU] | Freq: Three times a day (TID) | SUBCUTANEOUS | Status: DC
  Administered 2014-03-07: 10 [IU] via SUBCUTANEOUS
  Administered 2014-03-08: 19:00:00 via SUBCUTANEOUS
  Administered 2014-03-09: 10 [IU] via SUBCUTANEOUS

## 2014-03-07 MED ORDER — ONDANSETRON HCL 4 MG/2ML IJ SOLN
4.0000 mg | Freq: Once | INTRAMUSCULAR | Status: AC
  Administered 2014-03-07: 4 mg via INTRAVENOUS
  Filled 2014-03-07: qty 2

## 2014-03-07 MED ORDER — IOHEXOL 300 MG/ML  SOLN
100.0000 mL | Freq: Once | INTRAMUSCULAR | Status: AC | PRN
  Administered 2014-03-07: 100 mL via INTRAVENOUS

## 2014-03-07 MED ORDER — GABAPENTIN 300 MG PO CAPS
300.0000 mg | ORAL_CAPSULE | Freq: Three times a day (TID) | ORAL | Status: DC
  Administered 2014-03-07 – 2014-03-09 (×6): 300 mg via ORAL
  Filled 2014-03-07 (×9): qty 1

## 2014-03-07 MED ORDER — LORATADINE 10 MG PO TABS
10.0000 mg | ORAL_TABLET | Freq: Every day | ORAL | Status: DC
  Administered 2014-03-07 – 2014-03-09 (×3): 10 mg via ORAL
  Filled 2014-03-07 (×3): qty 1

## 2014-03-07 MED ORDER — ASPIRIN 81 MG PO CHEW
81.0000 mg | CHEWABLE_TABLET | Freq: Every day | ORAL | Status: DC
  Administered 2014-03-07 – 2014-03-09 (×3): 81 mg via ORAL
  Filled 2014-03-07 (×3): qty 1

## 2014-03-07 MED ORDER — LISINOPRIL 40 MG PO TABS
40.0000 mg | ORAL_TABLET | Freq: Every day | ORAL | Status: DC
  Administered 2014-03-07 – 2014-03-09 (×3): 40 mg via ORAL
  Filled 2014-03-07 (×3): qty 1

## 2014-03-07 MED ORDER — SUCRALFATE 1 GM/10ML PO SUSP
1.0000 g | Freq: Two times a day (BID) | ORAL | Status: DC
  Administered 2014-03-07 – 2014-03-09 (×5): 1 g via ORAL
  Filled 2014-03-07 (×6): qty 10

## 2014-03-07 MED ORDER — MORPHINE SULFATE 4 MG/ML IJ SOLN
4.0000 mg | Freq: Once | INTRAMUSCULAR | Status: AC
  Administered 2014-03-07: 4 mg via INTRAVENOUS
  Filled 2014-03-07: qty 1

## 2014-03-07 MED ORDER — INSULIN ASPART 100 UNIT/ML ~~LOC~~ SOLN
0.0000 [IU] | Freq: Three times a day (TID) | SUBCUTANEOUS | Status: DC
  Administered 2014-03-07: 3 [IU] via SUBCUTANEOUS
  Administered 2014-03-08: 5 [IU] via SUBCUTANEOUS
  Administered 2014-03-08: 3 [IU] via SUBCUTANEOUS

## 2014-03-07 MED ORDER — ONDANSETRON HCL 4 MG PO TABS: 4.0000 mg | ORAL_TABLET | Freq: Four times a day (QID) | ORAL | Status: DC | PRN

## 2014-03-07 MED ORDER — SIMETHICONE 80 MG PO CHEW
80.0000 mg | CHEWABLE_TABLET | Freq: Four times a day (QID) | ORAL | Status: DC | PRN
  Filled 2014-03-07: qty 1

## 2014-03-07 MED ORDER — LORAZEPAM 2 MG/ML IJ SOLN: 1.0000 mg | Freq: Once | INTRAMUSCULAR | Status: DC

## 2014-03-07 MED ORDER — AMLODIPINE BESYLATE 10 MG PO TABS
10.0000 mg | ORAL_TABLET | Freq: Every day | ORAL | Status: DC
  Administered 2014-03-07 – 2014-03-09 (×3): 10 mg via ORAL
  Filled 2014-03-07 (×3): qty 1

## 2014-03-07 MED ORDER — MORPHINE SULFATE 2 MG/ML IJ SOLN: 2.0000 mg | INTRAMUSCULAR | Status: DC | PRN

## 2014-03-07 MED ORDER — IBUPROFEN 800 MG PO TABS
800.0000 mg | ORAL_TABLET | Freq: Four times a day (QID) | ORAL | Status: DC | PRN
  Administered 2014-03-08: 800 mg via ORAL
  Filled 2014-03-07 (×2): qty 1

## 2014-03-07 MED ORDER — HYDROMORPHONE HCL PF 1 MG/ML IJ SOLN: 1.0000 mg | INTRAMUSCULAR | Status: AC | PRN

## 2014-03-07 MED ORDER — POTASSIUM CHLORIDE CRYS ER 10 MEQ PO TBCR
10.0000 meq | EXTENDED_RELEASE_TABLET | Freq: Every day | ORAL | Status: DC
  Administered 2014-03-07 – 2014-03-09 (×3): 10 meq via ORAL
  Filled 2014-03-07 (×3): qty 1

## 2014-03-07 MED ORDER — SODIUM CHLORIDE 0.9 % IV BOLUS (SEPSIS)
1000.0000 mL | Freq: Once | INTRAVENOUS | Status: AC
  Administered 2014-03-07: 1000 mL via INTRAVENOUS

## 2014-03-07 MED ORDER — ONDANSETRON HCL 4 MG/2ML IJ SOLN: 4.0000 mg | Freq: Four times a day (QID) | INTRAMUSCULAR | Status: DC | PRN

## 2014-03-07 MED ORDER — SODIUM CHLORIDE 0.9 % IV SOLN
INTRAVENOUS | Status: AC
  Administered 2014-03-07: 1000 mL via INTRAVENOUS
  Administered 2014-03-07 – 2014-03-08 (×2): via INTRAVENOUS

## 2014-03-07 MED ORDER — INSULIN GLARGINE 100 UNIT/ML ~~LOC~~ SOLN
30.0000 [IU] | Freq: Every day | SUBCUTANEOUS | Status: DC
Start: 2014-03-07 — End: 2014-03-08
  Administered 2014-03-07: 30 [IU] via SUBCUTANEOUS
  Filled 2014-03-07 (×2): qty 0.3

## 2014-03-07 NOTE — ED Notes (Signed)
Attempted to irrigate patient's ear, patient unable to tolerate and refuses further irrigation.  Provider notified.

## 2014-03-07 NOTE — ED Provider Notes (Signed)
CSN: [id number]     Arrival date & time 03/07/14  0347 History   First MD Initiated Contact with Patient 03/07/14 [phone]     Chief Complaint  Patient presents with  . Abdominal Pain     (Consider location/radiation/quality/duration/timing/severity/associated sxs/prior Treatment) HPI  Patient with hx pancreatic cancer (adenocarcinoma stage II vs III) and hx obstructive jaundice with stent placement presents with chronic upper abdominal and mid back pain.  Pt states she has 8/10 burning pain all of the time, unchanged for weeks to months.  Has had celiac nerve block without improvement.  Has been given percocet in the past but states she can only take it at night because she cannot function with it.  Two nights ago she vomited blood three times, since resolved without any nausea or change in her pain.  Has also noted several weeks of right ear pain, daughter found blood coming from it a few weeks ago while attempting to clean it with a Qtip.  Denies change in bowel habits including bloody stool, denies urinary symptoms.  No fevers.  No recent illness.  No recent change in her medications.    Oncologist is Dr Benay Spice  Pt has previously received radiation and xeloda for cancer, recently referred to surgery (Dr Barry Dienes) but is not a surgical candidate.  Had biliary occlusion/obstructing jaundice in July 2014, plastic stent placed followed by metal stent placed Dec 2014.    Past Medical History  Diagnosis Date  . DM (diabetes mellitus), type 2     requires insulin  . Hypertension   . Depression   . Anxiety   . Hyperlipidemia   . Pancreatitis   . GERD (gastroesophageal reflux disease)   . Pancreatic cancer 05/2013    adenocarcinoma on ERCP/FNA  . Hearing impaired     lost the last of her hearing aids and cn not get another one.  hearing is better via right ear.   Marland Kitchen History of radiation therapy 07/20/13-08/09/13    Pancreas 37.5Gy  . DDD (degenerative disc disease), lumbar   . Chronic back pain    . Stroke    Past Surgical History  Procedure Laterality Date  . Appendectomy    . Ercp N/A 05/24/2013    Procedure: ENDOSCOPIC RETROGRADE CHOLANGIOPANCREATOGRAPHY (ERCP);  Surgeon: Milus Banister, MD;  Location: [facility];  Service: Endoscopy;  Laterality: N/A;  MAC vs. general per anesthesia   . Tubal ligation    . Endoscopic retrograde cholangiopancreatography (ercp) with propofol N/A 10/21/2013    Procedure: ENDOSCOPIC RETROGRADE CHOLANGIOPANCREATOGRAPHY (ERCP) WITH PROPOFOL;  Surgeon: Milus Banister, MD;  Location: [facility];  Service: Endoscopy;  Laterality: N/A;  Stent removal   . Biliary stent placement N/A 10/21/2013    Procedure: BILIARY STENT PLACEMENT;  Surgeon: Milus Banister, MD;  Location: [facility];  Service: Endoscopy;  Laterality: N/A;  . Tee without cardioversion N/A 12/29/2013    Procedure: TRANSESOPHAGEAL ECHOCARDIOGRAM (TEE);  Surgeon: Dorothy Spark, MD;  Location: [facility];  Service: Cardiovascular;  Laterality: N/A;  . Eus N/A 02/10/2014    Procedure: UPPER ENDOSCOPIC ULTRASOUND (EUS) LINEAR;  Surgeon: Milus Banister, MD;  Location: [facility];  Service: Endoscopy;  Laterality: N/A;  celiac plexus neurolysis   Family History  Problem Relation Age of Onset  . Diabetes type II Mother   . Hypertension Mother   . Heart attack Father   . HIV/AIDS Brother    History  Substance Use Topics  . Smoking status: Former Smoker -- 0.50  packs/day for 42 years    Quit date: 05/28/2013  . Smokeless tobacco: Never Used  . Alcohol Use: No   OB History   Grav Para Term Preterm Abortions TAB SAB Ect Mult Living                 Review of Systems  Constitutional: Negative for fever.  Respiratory: Negative for cough and shortness of breath.   Cardiovascular: Negative for chest pain.  Gastrointestinal: Positive for vomiting (two days ago only) and abdominal pain. Negative for diarrhea, constipation and blood in stool.  Genitourinary: Negative for dysuria, urgency  and frequency.  Musculoskeletal: Positive for back pain.  All other systems reviewed and are negative.     Allergies  Betadine  Home Medications   Prior to Admission medications   Medication Sig Start Date End Date Taking? Authorizing Provider  amLODipine (NORVASC) 10 MG tablet Take 1 tablet (10 mg total) by mouth daily. 01/06/14   Donne Hazel, MD  aspirin 81 MG tablet Take 81 mg by mouth daily.    Historical Provider, MD  cetirizine (ZYRTEC) 10 MG tablet Take 10 mg by mouth daily.    Historical Provider, MD  gabapentin (NEURONTIN) 300 MG capsule Take 300 mg by mouth 3 (three) times daily.    Historical Provider, MD  HYDROmorphone (DILAUDID) 4 MG tablet Take 2-4 mg by mouth every 4 (four) hours as needed for moderate pain or severe pain. 07/15/13   Owens Shark, NP  ibuprofen (ADVIL) 200 MG tablet Take 800 mg by mouth every 6 (six) hours as needed for mild pain or moderate pain.     Historical Provider, MD  insulin aspart (NOVOLOG) 100 UNIT/ML injection Inject 10 Units into the skin 3 (three) times daily with meals.    Historical Provider, MD  insulin glargine (LANTUS) 100 UNIT/ML injection Inject 30 Units into the skin at bedtime.     Historical Provider, MD  lipase/protease/amylase (CREON-12/PANCREASE) 12000 UNITS CPEP capsule Take 1 capsule by mouth 3 (three) times daily before meals. 02/16/14   Owens Shark, NP  lisinopril (PRINIVIL,ZESTRIL) 40 MG tablet 1 tablet by mouth daily 12/13/13   Dorothy Spark, MD  metoprolol (LOPRESSOR) 50 MG tablet Take 1 tablet (50 mg total) by mouth 2 (two) times daily. 01/05/14   Donne Hazel, MD  OxyCODONE (OXYCONTIN) 10 mg T12A 12 hr tablet Take 1 tablet (10 mg total) by mouth 2 (two) times daily as needed (pain). 01/21/14   Stark Klein, MD  oxyCODONE-acetaminophen (PERCOCET/ROXICET) 5-325 MG per tablet Take 1-2 tablets by mouth every 6 (six) hours as needed for severe pain. 02/07/14   Owens Shark, NP  pantoprazole (PROTONIX) 40 MG tablet Take 40  mg by mouth 2 (two) times daily. 08/15/13   Kelvin Cellar, MD  polyethylene glycol (MIRALAX / GLYCOLAX) packet Take 17 g by mouth daily as needed for mild constipation or moderate constipation (constipation).     Historical Provider, MD  potassium chloride (K-DUR,KLOR-CON) 10 MEQ tablet Take 10 mEq by mouth daily.    Historical Provider, MD  simethicone (MYLICON) 161 MG chewable tablet Chew 125 mg by mouth every 6 (six) hours as needed for flatulence.    Historical Provider, MD  simvastatin (ZOCOR) 10 MG tablet Take 10 mg by mouth at bedtime.    Historical Provider, MD  sucralfate (CARAFATE) 1 GM/10ML suspension Take 10 mLs (1 g total) by mouth 2 (two) times daily. 10/21/13   Milus Banister, MD  traMADol (  ULTRAM) 50 MG tablet Take 50 mg by mouth every 6 (six) hours as needed.    Historical Provider, MD   BP 162/64  Pulse 66  Temp(Src) 98.2 F (36.8 C) (Oral)  Resp 24  SpO2 100% Physical Exam  Nursing note and vitals reviewed. Constitutional: She appears well-developed and well-nourished. No distress.  HENT:  Head: Normocephalic and atraumatic.  Left Ear: Tympanic membrane and ear canal normal.  Right ear canal with dried skin within canal, canal tender with exam.  Small scabbed area at base of canal without erythema, edema, discharge.    Eyes: No scleral icterus.  Neck: Normal range of motion. Neck supple.  Cardiovascular: Normal rate and regular rhythm.   Pulmonary/Chest: Effort normal and breath sounds normal. No respiratory distress. She has no wheezes. She has no rales.  Abdominal: Soft. Bowel sounds are normal. She exhibits no distension, no fluid wave and no ascites. There is tenderness in the right upper quadrant, epigastric area and left upper quadrant. There is no rebound and no guarding.  Neurological: She is alert.  Skin: She is not diaphoretic.    ED Course  Procedures (including critical care time) Labs Review Labs Reviewed  CBC WITH DIFFERENTIAL - Abnormal; Notable  for the following:    Hemoglobin 10.8 (*)    HCT 34.0 (*)    RDW 16.8 (*)    Lymphocytes Relative 10 (*)    Lymphs Abs 0.6 (*)    All other components within normal limits  COMPREHENSIVE METABOLIC PANEL - Abnormal; Notable for the following:    Glucose, Bld 172 (*)    Total Protein 8.8 (*)    AST 200 (*)    ALT 174 (*)    Alkaline Phosphatase 1135 (*)    All other components within normal limits  URINALYSIS, ROUTINE W REFLEX MICROSCOPIC - Abnormal; Notable for the following:    Color, Urine AMBER (*)    APPearance CLOUDY (*)    Bilirubin Urine SMALL (*)    Ketones, ur 15 (*)    Protein, ur 30 (*)    Urobilinogen, UA 2.0 (*)    Leukocytes, UA SMALL (*)    All other components within normal limits  URINE MICROSCOPIC-ADD ON - Abnormal; Notable for the following:    Squamous Epithelial / LPF MANY (*)    Crystals CA OXALATE CRYSTALS (*)    All other components within normal limits  LIPASE, BLOOD - Abnormal; Notable for the following:    Lipase 9 (*)    All other components within normal limits  URINE CULTURE    Imaging Review US Abdomen Complete  03/07/2014   CLINICAL DATA:  Pancreatic cancer, elevated LFTs  EXAM: ULTRASOUND ABDOMEN COMPLETE  COMPARISON:  CT abdomen pelvis dated 01/03/2014  FINDINGS: Gallbladder:  Gallbladder sludge. Mild gallbladder wall thickening, measuring 4 mm. No pericholecystic fluid. Negative sonographic Murphy's sign.  Common bile duct:  Diameter: 7 mm.  Indwelling biliary stent.  Liver:  Pneumobilia. Within normal limits for parenchymal echogenicity. No focal hepatic lesion is seen.  IVC:  No abnormality visualized.  Pancreas:  Poorly visualized.  Spleen:  Measures 8.3 cm.  Splenule measuring 1.6 cm.  Right Kidney:  Length: 11.3 cm.  No mass or hydronephrosis.  Left Kidney:  Length: 11.7 cm. 1.8 x 1.5 x 1.7 cm lower pole cyst. No hydronephrosis.  Abdominal aorta:  No aneurysm visualized.  Other findings:  None.  IMPRESSION: Pancreas is poorly visualized by  ultrasound.  Gallbladder sludge with mild gallbladder wall  thickening. No associated findings to suggest acute cholecystitis.  Pneumobilia with indwelling common duct stent.   Electronically Signed   By: Julian Hy M.D.   On: 03/07/2014 08:28     EKG Interpretation None      7:46 AM Dr Ashok Cordia made aware of patient.   Discussed pt with Dr Delane Ginger.    MDM   Final diagnoses:  Pancreatic cancer  Biliary obstruction  Elevated LFTs  Abdominal pain    Pt with pancreatic cancer presenting with uncontrolled pain.  Has been through radiation and xeloda, referred to surgery and found to not be a surgical candidate (please see Dr Marlowe Aschoff notes for more information).  Found to have elevated LFTs, new since February 2015 (Alk phos 176, AST 90, ALT 64). Pt has previously had biliary stricture and occlusion with stent placement (pastic in July, metal in December).  Admitted to [facility] hospitalist for pain management and to further evaluate this new LFT elevation and suspected biliary obstruction from progressive disease.  Hospitalist has contacted GI for consult.    Also with right ear pain - ear was to be irrigated by RN but pt did not tolerate it.  She has a small scab in the canal without e/o superinfection.  This is likely where the blood came from previously.  No e/o OE.        Clayton Bibles, PA-C 03/07/14 1057

## 2014-03-07 NOTE — Progress Notes (Signed)
Hypoglycemic Event  CBG: 63  Treatment: 15 GM carbohydrate snack  Symptoms: None  Follow-up CBG: Time: 1752 CBG Result: 74  Possible Reasons for Event: Unknown  Comments/MD notified: Dr. Genelle Bal  Remember to initiate Hypoglycemia Order Set & complete

## 2014-03-07 NOTE — ED Notes (Signed)
Patient transported to US 

## 2014-03-07 NOTE — ED Notes (Signed)
PA at bedside.

## 2014-03-07 NOTE — Progress Notes (Signed)
NURSING PROGRESS NOTE  [name] [name] [id number] Admission Data: 03/07/2014 12:21 PM Attending Provider: Cristal Ford, DO EYC:[id number],UDJSH A, MD Code Status: full   [name] [name] is a 58 y.o. female patient admitted from ED:  -No acute distress noted.  -No complaints of shortness of breath.  -No complaints of chest pain.     Blood pressure 176/81, pulse 59, temperature 97.9 F (36.6 C), temperature source Oral, resp. rate 16, height 5\' 3"  (1.6 m), weight 66.361 kg (146 lb 4.8 oz), SpO2 95.00%.   IV Fluids:  IV in place, occlusive dsg intact without redness, IV cath antecubital left, condition patent and no redness normal saline.   Allergies:  Betadine  Past Medical History:   has a past medical history of DM (diabetes mellitus), type 2; Hypertension; Depression; Anxiety; Hyperlipidemia; Pancreatitis; GERD (gastroesophageal reflux disease); Pancreatic cancer (05/2013); Hearing impaired; History of radiation therapy (07/20/13-08/09/13); DDD (degenerative disc disease), lumbar; Chronic back pain; Stroke; Anemia; and Ulcer (01/2014).  Past Surgical History:   has past surgical history that includes Appendectomy; ERCP (N/A, 05/24/2013); Tubal ligation; Endoscopic retrograde cholangiopancreatography (ercp) with propofol (N/A, 10/21/2013); biliary stent placement (N/A, 10/21/2013); TEE without cardioversion (N/A, 12/29/2013); EUS (N/A, 02/10/2014); and Bile duct stent placement.  Social History:   reports that she quit smoking about 9 months ago. She has never used smokeless tobacco. She reports that she does not drink alcohol or use illicit drugs.  Skin: warm dry intact  Patient/Family orientated to room. Information packet given to patient/family. Admission inpatient armband information verified with patient/family to include name and date of birth and placed on patient arm. Side rails up x 2, fall assessment and education completed with patient/family. Patient/family able to verbalize understanding of  risk associated with falls and verbalized understanding to call for assistance before getting out of bed. Call light within reach. Patient/family able to voice and demonstrate understanding of unit orientation instructions.    Will continue to evaluate and treat per MD orders.

## 2014-03-07 NOTE — Consult Note (Signed)
[facility] Consult: 11:06 AM 03/07/2014  LOS: 0 days    Referring Provider: Dr Ree Kida Primary Care Physician:  Philis Fendt, MD Primary Gastroenterologist: Dr. Owens Loffler.    Oncologists: Dr Ammie Dalton, Dr Kyung Rudd.     Reason for Consultation:  Elevated LFTs and ongoing abdominal pain.    HPI: [name] [name] is a 58 y.o. female.  Hx IDDM, alcoholism, alcoholic pancreatitis, CVA.     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  Plastic biliary stent placed (9 cm long plastic stent across a 4 cm distal biliary stricture. Brushings during that procedure confirmed adenocarcinoma). T bili preprocedure was 4.6 with transaminases 160/290  Started Xeloda and XRT on 07/20/13. Oncology had problems contacting pt and pt had vacillated about moving to [place] with her family. Thus the time delay in initiation of treatment.  Seen by GI 08/12/13 for n/v abdominal pain. Gastric wall, and transverse colonic thickened (? 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  ERCP 10/21/13 with radiation inflammation at distal stomach, duodenum including clean based pre-pyloric ulcer.  Malignant stricture was dilated and plastic stent exchanged for permanent 8cm long 48m diameter uncovered SEMS.  Was RXd bid Protonix. Takes this with Carafate, Reglan.    Admission 2/4 - 01/05/14 with sepsis, strep viridans bacteremia. TEE negative for vegetation.  CT scan showed GB sludge, ?wall thickening, fluid collection adjacent to GB fundus (abcess vs Phrygian cap), patent biliary stent.  Surgery recommended abx and outpt follow up. 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  She is not a resection candidate.   Underwent 02/10/14 EUS with celiac plexus neurolysis for pain management. Still has ongoing flank and generalized abdominal pain. Pancrease added 4/1 by oncology NP due to c/o fatty appearing loose stools.  Plan per Dr SAmmie Daltonwas to proceed with gemcitabine/Abraxane chemotherapy for increased pain.  This has not been initiated yet.   Admitted today with abdominal pain.  Taking about 4 advil and one percocet per day for pain.  Some nausea.  Says she threw up blood on Sat evening.  No melenic stools, diarrhea has resolved.  No ETOH. LFTs are up.  Tbili normal but AST/ALT/Alk phos is 200/174/1135.  Compared to 176/90/64 on 12/24/13.   Ultrasound shows mild GB wall thickening, GB sludge, no suggestion of cholecystitis, poorly vis'd pancreas.  + 2 weeks pruritus, + anorexia/early satiety.     Past Medical History  Diagnosis Date  . DM (diabetes mellitus), type 2     requires insulin  . Hypertension   . Depression   . Anxiety   . Hyperlipidemia   . Pancreatitis   . GERD (gastroesophageal reflux disease)   . Pancreatic cancer 05/2013    adenocarcinoma on ERCP/FNA  . Hearing impaired     lost the last of her hearing aids and cn not get another one.  hearing is  better via right ear.   Marland Kitchen History of radiation therapy 07/20/13-08/09/13    Pancreas 37.5Gy  . DDD (degenerative disc disease), lumbar   . Chronic back pain   . Stroke     Past Surgical History  Procedure Laterality Date  . Appendectomy    . Ercp N/A 05/24/2013    Procedure: ENDOSCOPIC RETROGRADE CHOLANGIOPANCREATOGRAPHY (ERCP);  Surgeon: Milus Banister, MD;  Location: [facility];  Service: Endoscopy;  Laterality: N/A;  MAC vs. general per anesthesia   . Tubal ligation    . Endoscopic retrograde cholangiopancreatography (ercp) with propofol N/A 10/21/2013    Procedure: ENDOSCOPIC RETROGRADE CHOLANGIOPANCREATOGRAPHY (ERCP) WITH PROPOFOL;  Surgeon:  Milus Banister, MD;  Location: [facility];  Service: Endoscopy;  Laterality: N/A;  Stent removal   . Biliary stent placement N/A 10/21/2013    Procedure: BILIARY STENT PLACEMENT;  Surgeon: Milus Banister, MD;  Location: [facility];  Service: Endoscopy;  Laterality: N/A;  . Tee without cardioversion N/A 12/29/2013    Procedure: TRANSESOPHAGEAL ECHOCARDIOGRAM (TEE);  Surgeon: Dorothy Spark, MD;  Location: [facility];  Service: Cardiovascular;  Laterality: N/A;  . Eus N/A 02/10/2014    Procedure: UPPER ENDOSCOPIC ULTRASOUND (EUS) LINEAR;  Surgeon: Milus Banister, MD;  Location: [facility];  Service: Endoscopy;  Laterality: N/A;  celiac plexus neurolysis    Prior to Admission medications   Medication Sig Start Date End Date Taking? Authorizing Provider  amLODipine (NORVASC) 10 MG tablet Take 1 tablet (10 mg total) by mouth daily. 01/06/14  Yes Donne Hazel, MD  aspirin 81 MG tablet Take 81 mg by mouth daily.   Yes Historical Provider, MD  cetirizine (ZYRTEC) 10 MG tablet Take 10 mg by mouth daily.   Yes Historical Provider, MD  gabapentin (NEURONTIN) 300 MG capsule Take 300 mg by mouth 3 (three) times daily.   Yes Historical Provider, MD  ibuprofen (ADVIL) 200 MG tablet Take 800 mg by mouth every 6 (six) hours as needed for mild pain or moderate pain.    Yes Historical Provider, MD  insulin aspart (NOVOLOG) 100 UNIT/ML injection Inject 10 Units into the skin 3 (three) times daily with meals.   Yes Historical Provider, MD  insulin glargine (LANTUS) 100 UNIT/ML injection Inject 30 Units into the skin at bedtime.    Yes Historical Provider, MD  ketoconazole (NIZORAL) 200 MG tablet Take 200 mg by mouth daily.   Yes Historical Provider, MD  lipase/protease/amylase (CREON-12/PANCREASE) 12000 UNITS CPEP capsule Take 1 capsule by mouth 3 (three) times daily before meals. 02/16/14  Yes Owens Shark, NP  lisinopril (PRINIVIL,ZESTRIL) 40 MG tablet Take 40 mg by mouth daily.   Yes Historical  Provider, MD  metoCLOPramide (REGLAN) 5 MG tablet Take 5 mg by mouth 4 (four) times daily.   Yes Historical Provider, MD  metoprolol tartrate (LOPRESSOR) 25 MG tablet Take 25 mg by mouth 2 (two) times daily.   Yes Historical Provider, MD  pantoprazole (PROTONIX) 40 MG tablet Take 40 mg by mouth daily.  08/15/13  Yes Kelvin Cellar, MD  potassium chloride (K-DUR,KLOR-CON) 10 MEQ tablet Take 10 mEq by mouth daily.   Yes Historical Provider, MD  simethicone (MYLICON) 485 MG chewable tablet Chew 125 mg by mouth every 6 (six) hours as needed for flatulence.   Yes Historical Provider, MD  simvastatin (ZOCOR) 10 MG tablet Take 10 mg by mouth at bedtime.   Yes Historical Provider, MD  sucralfate (CARAFATE) 1 GM/10ML suspension Take 10 mLs (1 g total)  by mouth 2 (two) times daily. 10/21/13  Yes Milus Banister, MD  traMADol (ULTRAM) 50 MG tablet Take 50 mg by mouth every 6 (six) hours as needed for moderate pain.    Yes Historical Provider, MD    Scheduled Meds:  Infusions:  PRN Meds:      Allergies as of 03/07/2014 - Review Complete 03/07/2014  Allergen Reaction Noted  . Betadine [povidone iodine] Itching and Rash 03/19/2013    Family History  Problem Relation Age of Onset  . Diabetes type II Mother   . Hypertension Mother   . Heart attack Father   . HIV/AIDS Brother     History   Social History  . Marital Status: Single    Spouse Name: N/A    Number of Children: 5  . Years of Education: N/A   Occupational History  . Not on file.   Social History Main Topics  . Smoking status: Former Smoker -- 0.50 packs/day for 42 years    Quit date: 05/28/2013  . Smokeless tobacco: Never Used  . Alcohol Use: No  . Drug Use: No  . Sexual Activity: Yes    Birth Control/ Protection: Post-menopausal   Other Topics Concern  . Not on file   Social History Narrative  . No narrative on file    REVIEW OF SYSTEMS: Constitutional:  Weight was 150, now 146# ENT:  No nose bleeds.  + hearing  loss and pain in right ear.   Pulm:  No cough or dyspnea CV:  No palpitations, no LE edema.  GU:  + decreased urine output, urine is dark GI:  Per HPI Heme:  No iron at home   Transfusions:  None.  Neuro:  No headaches, no peripheral tingling or numbness.  = balance problems since her stroke, no recent fall.  Unable to  negotiate using cane. Daughter says no confusion.  Derm:  No itching, no rash or sores.  Endocrine:  No sweats or chills.  No polyuria or dysuria Immunization:  Flu and pneumovax in 10/2013.  Travel:  None beyond local counties in last few months.    PHYSICAL EXAM: Vital signs in last 24 hours: Filed Vitals:   03/07/14 1045  BP: 151/62  Pulse: 61  Temp:   Resp:    Wt Readings from Last 3 Encounters:  02/16/14 68.221 kg (150 lb 6.4 oz)  02/07/14 67.994 kg (149 lb 14.4 oz)  01/24/14 68.493 kg (151 lb)   General: extremely HOH.  Comfortable, somewhat agitated Head:  No swelling or asymetry Eyes:  No icterus or pallor.Marland Kitchen Exopthalmos.  Ears:  Very HOH, no hearing aid  Nose:  No congestion Mouth:  Clear, moist.  Neck:  No mass, no JVD, no TMG Lungs:  Clear but diminished BS Heart: RRR.  No MRG Abdomen:  ND, generally firm vs extensive tumor throughout abdome, not tender.  Sound are hypoactive Rectal: not done   Musc/Skeltl: no joint swellling Extremities:  No CCE  Neurologic:  Weakness on right arm, no tremor.  Oriented x 3. Skin:  No rash or sores Tattoos:  none Nodes:  No cervical adenopathy   Psych:  Cooperative, agitated at times.   Intake/Output from previous day:   Intake/Output this shift:    LAB RESULTS:  Recent Labs  03/07/14 0553  WBC 5.7  HGB 10.8*  HCT 34.0*  PLT 184   BMET Lab Results  Component Value Date   NA 139 03/07/2014   NA 140 01/03/2014   NA  137 01/01/2014   K 4.0 03/07/2014   K 3.5* 01/03/2014   K 3.4* 01/01/2014   CL 101 03/07/2014   CL 105 01/03/2014   CL 102 01/01/2014   CO2 22 03/07/2014   CO2 23 01/03/2014   CO2 22  01/01/2014   GLUCOSE 172* 03/07/2014   GLUCOSE 74 01/03/2014   GLUCOSE 149* 01/01/2014   BUN 16 03/07/2014   BUN 15 01/03/2014   BUN 17 01/01/2014   CREATININE 0.68 03/07/2014   CREATININE 0.77 01/03/2014   CREATININE 0.76 01/01/2014   CALCIUM 9.8 03/07/2014   CALCIUM 8.8 01/03/2014   CALCIUM 9.0 01/01/2014   LFT  Recent Labs  03/07/14 0553  PROT 8.8*  ALBUMIN 3.5  AST 200*  ALT 174*  ALKPHOS 1135*  BILITOT 1.2   PT/INR Lab Results  Component Value Date   INR 1.1* 09/24/2013   INR 1.44 08/14/2013   INR 1.44 08/13/2013   Hepatitis Panel No results found for this basename: HEPBSAG, HCVAB, HEPAIGM, HEPBIGM,  in the last 72 hours C-Diff No components found with this basename: cdiff   Lipase     Component Value Date/Time   LIPASE 9* 03/07/2014 0553    Drugs of Abuse  No results found for this basename: labopia, cocainscrnur, labbenz, amphetmu, thcu, labbarb     RADIOLOGY STUDIES: US Abdomen Complete 03/07/2014   CLINICAL DATA:  Pancreatic cancer, elevated LFTs  EXAM: ULTRASOUND ABDOMEN COMPLETE  COMPARISON:  CT abdomen pelvis dated 01/03/2014  FINDINGS: Gallbladder:  Gallbladder sludge. Mild gallbladder wall thickening, measuring 4 mm. No pericholecystic fluid. Negative sonographic Murphy's sign.  Common bile duct:  Diameter: 7 mm.  Indwelling biliary stent.  Liver:  Pneumobilia. Within normal limits for parenchymal echogenicity. No focal hepatic lesion is seen.  IVC:  No abnormality visualized.  Pancreas:  Poorly visualized.  Spleen:  Measures 8.3 cm.  Splenule measuring 1.6 cm.  Right Kidney:  Length: 11.3 cm.  No mass or hydronephrosis.  Left Kidney:  Length: 11.7 cm. 1.8 x 1.5 x 1.7 cm lower pole cyst. No hydronephrosis.  Abdominal aorta:  No aneurysm visualized.  Other findings:  None.  IMPRESSION: Pancreas is poorly visualized by ultrasound.  Gallbladder sludge with mild gallbladder wall thickening. No associated findings to suggest acute cholecystitis.  Pneumobilia with indwelling  common duct stent.   Electronically Signed   By: Julian Hy M.D.   On: 03/07/2014 08:28    ENDOSCOPIC STUDIES: 02/10/14  EUS celiac plexus neurolysis.  EUS findings:  1. The aorto-celiac anatomy was clearly identified. Using a 63  guage EUS FNA needle, the right side of celiac artery take off was  injected with 5cc sterile saline, followed by 10cc bupicicaine,  followed by 10cc of 98% alcohol in usual fashion. The same  procedure was then performed to the left side of the celiac artery  takeoff.  Impression:  Celiac plexus neurolysis was performed for pancreatic cancer related  abdominal and back pain. She will likely notice that she requires  less (none?) narcotic pain medicines for her pain.  10/21/13  ERCP  ENDOSCOPIC IMPRESSION:  1. Significant radiation related inflammation in distal stomach and  duodenum. This includes a deep but clean based pre-pyloric ulcer.  2. The previously placed biliary stent was removed and a permanent  8cm long 30m diameter uncovered SEMS was placed after the  stricture was dilated with biliary dilating balloon.  RECOMMENDATIONS:  Follow clinically. She is already taking twice daily PPI  (Protonix). I have called in a  new prescription for carafate (1  dose twice daily). I will communicate with Dr. Lisbeth Renshaw about  considering a "holiday" from radiation to allow the deep pre  pyloric ulcer some time to heal.  05/24/2013 ERCP with stent placement  INDICATIONS:jaundice, mild abd and back pain, weight loss; MRI shows  5cm mass in pancreatic head causing biliary obstruction.  ENDOSCOPIC IMPRESSION:  4-5cm long bile duct stricture with proximal edge in proximal CBD  (almost at level of cystic duct takeoff). This was brushed for  cytology and then stented with an 8.5Fr 9cm long plastic biliary  stent. Given MRI findings, elevated CA 19-9 I suspect this  represents locally advanced pancreatic cancer.  Pathology/Cytology  COMMON BILE DUCT BRUSHING:    MALIGNANT CELLS PRESENT CONSISTENT WITH ADENOCARCINOMA.   IMPRESSION:   * Pancreatic adenocarcinoma diagnosed 05/2013.  * 05/24/13 ERCP and plastic biliary stent.  Chemo/radiation 9/2 - 9/22. 10/21/13  ERCP and stent exchange with uncovered metal stent.  02/10/14 EUS, celiac nerve block.  This did not help the pain.    *  Radiation gastroduodentitis and duodenal ulcer on 10/21/13 ERCP.  On once daily Protonix, Carafate at home. Also takes 800 mg advil daily.  Vomited blood once over the weekend.   *  Pancreatic exocrine insufficiency. On pancrease with resolved loose, fatty stools.   * SMV associated with tumor invasion.   * DM 2. Insulin requiring.  *  Hearing loss, chronic.    PLAN:     *  CT scan abdomen/pelvis.  *  ? EGD to assess the episode of hematemesis?  Continue daily Protonix.     Vena Rua  03/07/2014, 11:06 AM Pager: [phone]  GI ATTENDING  History, laboratories, x-rays, prior endoscopy reports reviewed. Patient personally seen and examined. Agree with H&P as outlined above Patient is admitted to the hospital with abdominal pain. Ongoing problem related to pancreatic cancer. Previous biliary stenting for malignant obstructive jaundice. Prior ERCP and EUS as noted. LFTs elevated without jaundice. Has been on NSAIDs. Minor hematemesis. Hemoglobin stable. No acute bleeding. Vital signs stable. Agree with CT scan as next step. No plans for EGD at this point. Will follow.  Docia Chuck. Geri Seminole., M.D. [facility] Division of Gastroenterology

## 2014-03-07 NOTE — ED Notes (Addendum)
Pt presents to the department with multiple complaints. Pt c/o abdominal pain and back pain, however pt states this pain is chronic. Pt reports she has a hx of pancreatic CA and is seen at the CA center. Pt reports she vomited blood two days ago, pt states there was more blood than bile and some brown stuff. Pt denies nausea at this time. Pt reports her urine has an odor and that she thinks she has a yeast infection. Pt reports diarrhea on and off. Pt also c/o right ear pain and pt's daughter reports pt's right ear had dried blood in it.

## 2014-03-07 NOTE — H&P (Signed)
[facility] History and Physical  [name] GUY:[id number] DOB: 21-Dec-1955 DOA: 03/07/2014  Referring physician:  PCP: Philis Fendt, MD  Specialists: Dr. Benay Spice, oncologist  Chief Complaint: Abdominal pain  HPI: [name] [name] is a 58 y.o. female  With a history of pancreatic cancer, history of obstructive jaundice with stent placement in December 2014, mild back pain that presents to the emergency room for abdominal pain. Patient states she's had burning pain and rates as an 8/10 which is been unchanged from weeks to months. She has had a celiac nerve block without any improvement.  Patient states she has had approximately 3 episodes of vomiting with some blood over the weekend. She also complains of some right ear pain which occurred a few weeks ago. Per her daughter, patient attempted to clean her year with a Q-tip a few weeks ago. Of note patient was seen by surgery Dr. Barry Dienes and was found not to be a surgical candidate. She did receive radiation and chemotherapy for her cancer. She had a history of biliary occlusion with obstructing jaundice in July 2014 however had a metal stent placed in December 2014. Currently she denies any chest pain, shortness of breath.  Review of Systems:  Constitutional: Denies fever, chills, diaphoresis, appetite change and fatigue.  HEENT: Denies photophobia, eye pain, redness, hearing loss, ear pain, congestion, sore throat, rhinorrhea, sneezing, mouth sores, trouble swallowing, neck pain, neck stiffness and tinnitus.   Respiratory: Denies SOB, DOE, cough, chest tightness,  and wheezing.   Cardiovascular: Denies chest pain, palpitations and leg swelling.  Gastrointestinal: Complains of abdominal pain with nausea and vomiting, 2 days ago. Denies any current constipation or blood in stool. Has diarrhea occasionally. Genitourinary: Denies dysuria, urgency, frequency, hematuria, flank pain and difficulty urinating.  Musculoskeletal: Complains of chronic  back pain.  Skin: Denies pallor, rash and wound.  Neurological: Denies dizziness, seizures, syncope, weakness, light-headedness, numbness and headaches.  Hematological: Denies adenopathy. Easy bruising, personal or family bleeding history  Psychiatric/Behavioral: Denies suicidal ideation, mood changes, confusion, nervousness, sleep disturbance and agitation  Past Medical History  Diagnosis Date  . DM (diabetes mellitus), type 2     requires insulin  . Hypertension   . Depression   . Anxiety   . Hyperlipidemia   . Pancreatitis   . GERD (gastroesophageal reflux disease)   . Pancreatic cancer 05/2013    adenocarcinoma on ERCP/FNA  . Hearing impaired     lost the last of her hearing aids and cn not get another one.  hearing is better via right ear.   Marland Kitchen History of radiation therapy 07/20/13-08/09/13    Pancreas 37.5Gy  . DDD (degenerative disc disease), lumbar   . Chronic back pain   . Stroke    Past Surgical History  Procedure Laterality Date  . Appendectomy    . Ercp N/A 05/24/2013    Procedure: ENDOSCOPIC RETROGRADE CHOLANGIOPANCREATOGRAPHY (ERCP);  Surgeon: Milus Banister, MD;  Location: [facility];  Service: Endoscopy;  Laterality: N/A;  MAC vs. general per anesthesia   . Tubal ligation    . Endoscopic retrograde cholangiopancreatography (ercp) with propofol N/A 10/21/2013    Procedure: ENDOSCOPIC RETROGRADE CHOLANGIOPANCREATOGRAPHY (ERCP) WITH PROPOFOL;  Surgeon: Milus Banister, MD;  Location: [facility];  Service: Endoscopy;  Laterality: N/A;  Stent removal   . Biliary stent placement N/A 10/21/2013    Procedure: BILIARY STENT PLACEMENT;  Surgeon: Milus Banister, MD;  Location: [facility];  Service: Endoscopy;  Laterality: N/A;  . Tee without cardioversion N/A 12/29/2013  Procedure: TRANSESOPHAGEAL ECHOCARDIOGRAM (TEE);  Surgeon: Dorothy Spark, MD;  Location: [facility];  Service: Cardiovascular;  Laterality: N/A;  . Eus N/A 02/10/2014    Procedure: UPPER ENDOSCOPIC  ULTRASOUND (EUS) LINEAR;  Surgeon: Milus Banister, MD;  Location: [facility];  Service: Endoscopy;  Laterality: N/A;  celiac plexus neurolysis   Social History:  reports that she quit smoking about 9 months ago. She has never used smokeless tobacco. She reports that she does not drink alcohol or use illicit drugs. Lives at home. Can perform her daily activities.  Allergies  Allergen Reactions  . Betadine [Povidone Iodine] Itching and Rash    Family History  Problem Relation Age of Onset  . Diabetes type II Mother   . Hypertension Mother   . Heart attack Father   . HIV/AIDS Brother     Prior to Admission medications   Medication Sig Start Date End Date Taking? Authorizing Provider  amLODipine (NORVASC) 10 MG tablet Take 1 tablet (10 mg total) by mouth daily. 01/06/14  Yes Donne Hazel, MD  aspirin 81 MG tablet Take 81 mg by mouth daily.   Yes Historical Provider, MD  cetirizine (ZYRTEC) 10 MG tablet Take 10 mg by mouth daily.   Yes Historical Provider, MD  gabapentin (NEURONTIN) 300 MG capsule Take 300 mg by mouth 3 (three) times daily.   Yes Historical Provider, MD  ibuprofen (ADVIL) 200 MG tablet Take 800 mg by mouth every 6 (six) hours as needed for mild pain or moderate pain.    Yes Historical Provider, MD  insulin aspart (NOVOLOG) 100 UNIT/ML injection Inject 10 Units into the skin 3 (three) times daily with meals.   Yes Historical Provider, MD  insulin glargine (LANTUS) 100 UNIT/ML injection Inject 30 Units into the skin at bedtime.    Yes Historical Provider, MD  ketoconazole (NIZORAL) 200 MG tablet Take 200 mg by mouth daily.   Yes Historical Provider, MD  lipase/protease/amylase (CREON-12/PANCREASE) 12000 UNITS CPEP capsule Take 1 capsule by mouth 3 (three) times daily before meals. 02/16/14  Yes Owens Shark, NP  lisinopril (PRINIVIL,ZESTRIL) 40 MG tablet Take 40 mg by mouth daily.   Yes Historical Provider, MD  metoCLOPramide (REGLAN) 5 MG tablet Take 5 mg by mouth 4 (four)  times daily.   Yes Historical Provider, MD  metoprolol tartrate (LOPRESSOR) 25 MG tablet Take 25 mg by mouth 2 (two) times daily.   Yes Historical Provider, MD  pantoprazole (PROTONIX) 40 MG tablet Take 40 mg by mouth daily.  08/15/13  Yes Kelvin Cellar, MD  potassium chloride (K-DUR,KLOR-CON) 10 MEQ tablet Take 10 mEq by mouth daily.   Yes Historical Provider, MD  simethicone (MYLICON) 0000000 MG chewable tablet Chew 125 mg by mouth every 6 (six) hours as needed for flatulence.   Yes Historical Provider, MD  simvastatin (ZOCOR) 10 MG tablet Take 10 mg by mouth at bedtime.   Yes Historical Provider, MD  sucralfate (CARAFATE) 1 GM/10ML suspension Take 10 mLs (1 g total) by mouth 2 (two) times daily. 10/21/13  Yes Milus Banister, MD  traMADol (ULTRAM) 50 MG tablet Take 50 mg by mouth every 6 (six) hours as needed for moderate pain.    Yes Historical Provider, MD   Physical Exam: Filed Vitals:   03/07/14 1034  BP: 156/68  Pulse: 56  Temp:   Resp: 17     General: Well developed, well nourished, NAD, appears stated age  HEENT: NCAT, PERRLA, EOMI, Anicteic Sclera, mucous membranes moist.  Neck: Supple, no JVD, no masses  Cardiovascular: S1 S2 auscultated, no rubs, murmurs or gallops. Regular rate and rhythm.  Respiratory: Clear to auscultation bilaterally with equal chest rise  Abdomen: Soft, diffusely tender, nondistended, + bowel sounds  Extremities: warm dry without cyanosis clubbing or edema  Neuro: AAOx3, cranial nerves grossly intact. Right upper extremity slightly weaker on the left  Skin: Without rashes exudates or nodules  Psych: Agitated, however cooperative, intact insight and judgment  Labs on Admission:  Basic Metabolic Panel:  Recent Labs Lab 03/07/14 0553  NA 139  K 4.0  CL 101  CO2 22  GLUCOSE 172*  BUN 16  CREATININE 0.68  CALCIUM 9.8   Liver Function Tests:  Recent Labs Lab 03/07/14 0553  AST 200*  ALT 174*  ALKPHOS 1135*  BILITOT 1.2  PROT  8.8*  ALBUMIN 3.5    Recent Labs Lab 03/07/14 0553  LIPASE 9*   No results found for this basename: AMMONIA,  in the last 168 hours CBC:  Recent Labs Lab 03/07/14 0553  WBC 5.7  NEUTROABS 4.3  HGB 10.8*  HCT 34.0*  MCV 82.3  PLT 184   Cardiac Enzymes: No results found for this basename: CKTOTAL, CKMB, CKMBINDEX, TROPONINI,  in the last 168 hours  BNP (last 3 results) No results found for this basename: PROBNP,  in the last 8760 hours CBG: No results found for this basename: GLUCAP,  in the last 168 hours  Radiological Exams on Admission: US Abdomen Complete  03/07/2014   CLINICAL DATA:  Pancreatic cancer, elevated LFTs  EXAM: ULTRASOUND ABDOMEN COMPLETE  COMPARISON:  CT abdomen pelvis dated 01/03/2014  FINDINGS: Gallbladder:  Gallbladder sludge. Mild gallbladder wall thickening, measuring 4 mm. No pericholecystic fluid. Negative sonographic Murphy's sign.  Common bile duct:  Diameter: 7 mm.  Indwelling biliary stent.  Liver:  Pneumobilia. Within normal limits for parenchymal echogenicity. No focal hepatic lesion is seen.  IVC:  No abnormality visualized.  Pancreas:  Poorly visualized.  Spleen:  Measures 8.3 cm.  Splenule measuring 1.6 cm.  Right Kidney:  Length: 11.3 cm.  No mass or hydronephrosis.  Left Kidney:  Length: 11.7 cm. 1.8 x 1.5 x 1.7 cm lower pole cyst. No hydronephrosis.  Abdominal aorta:  No aneurysm visualized.  Other findings:  None.  IMPRESSION: Pancreas is poorly visualized by ultrasound.  Gallbladder sludge with mild gallbladder wall thickening. No associated findings to suggest acute cholecystitis.  Pneumobilia with indwelling common duct stent.   Electronically Signed   By: Julian Hy M.D.   On: 03/07/2014 08:28    EKG: None  Assessment/Plan  58 year old female with history of recently diagnosed pancreatic adenocarcinoma, hypertension, diabetes mellitus presented for abdominal pain and elevated LFTs. Patient be admitted to the medical  floor.  Chronic Abdominal pain with elevated LFTs -Alkaline phosphatase 1135, AST 200, ALT 174 -Patient recently had ERCP in December 2014 with stent exchanged to metal stent  -Recently had a celiac nerve block in March 2015  -Gastroenterology consulted  -Will place patient on clear liquid diet  -Continue Protonix and Carafate  -Abdominal ultrasound: Pancreas is poorly visualized, gallbladder sludge with no acute cholecystitis, pneumobilia with indwelling common duct stent  -Will provide pain control -Patient may need CT of the abdomen pelvis, will wait for recommendations from gastroenterology -Ketoconazole and statin currently held  History of pancreatic adenocarcinoma -Patient was diagnosed in July 2014 and undergo radiation chemotherapy September 2014 -Patient will need followup with Dr. Benay Spice as an outpatient  Diabetes  mellitus type 2 -Continue home insulin regimen with Lantus and NovoLog, we'll also place on sliding insulin scale and CBG monitoring  Hypertension -Currently controlled -Continue amlodipine, metoprolol, and lisinopril  GERD -Continue Protonix  Hyperlipidemia -Will hold statin due to elevated LFTs  Chronic back pain  -Continue pain control  Anemia -Baseline hemoglobin appears to be 8, currently 10.8 -No evidence of bleeding at this time, we'll monitor CBC  DVT prophylaxis: SCDs  Code Status: Full  Condition: Guarded  Family Communication: Daughter at bedside. Admission, patients condition and plan of care including tests being ordered have been discussed with the patient and daughter who indicate understanding and agree with the plan and Code Status.  Disposition Plan: Admitted  Time spent: 60 minutes  [place] D.O. [facility] Pager [phone]  If 7PM-7AM, please contact night-coverage [email or web address] Password [facility] 03/07/2014, 10:46 AM

## 2014-03-07 NOTE — ED Notes (Addendum)
Pt refused ear wax removal. Pt states that "it hurt too much".

## 2014-03-08 DIAGNOSIS — L989 Disorder of the skin and subcutaneous tissue, unspecified: Secondary | ICD-10-CM

## 2014-03-08 DIAGNOSIS — C250 Malignant neoplasm of head of pancreas: Secondary | ICD-10-CM

## 2014-03-08 DIAGNOSIS — R748 Abnormal levels of other serum enzymes: Secondary | ICD-10-CM

## 2014-03-08 DIAGNOSIS — Z86718 Personal history of other venous thrombosis and embolism: Secondary | ICD-10-CM

## 2014-03-08 DIAGNOSIS — F341 Dysthymic disorder: Secondary | ICD-10-CM

## 2014-03-08 LAB — COMPREHENSIVE METABOLIC PANEL
ALBUMIN: 2.8 g/dL — AB (ref 3.5–5.2)
ALK PHOS: 803 U/L — AB (ref 39–117)
ALT: 113 U/L — AB (ref 0–35)
ALT: 116 U/L — ABNORMAL HIGH (ref 0–35)
AST: 98 U/L — AB (ref 0–37)
AST: 94 U/L — AB (ref 0–37)
Albumin: 3.1 g/dL — ABNORMAL LOW (ref 3.5–5.2)
Alkaline Phosphatase: 872 U/L — ABNORMAL HIGH (ref 39–117)
BUN: 11 mg/dL (ref 6–23)
BUN: 10 mg/dL (ref 6–23)
CALCIUM: 8.9 mg/dL (ref 8.4–10.5)
CALCIUM: 9.3 mg/dL (ref 8.4–10.5)
CO2: 22 mEq/L (ref 19–32)
CO2: 24 mEq/L (ref 19–32)
CREATININE: 0.61 mg/dL (ref 0.50–1.10)
Chloride: 105 mEq/L (ref 96–112)
Chloride: 103 mEq/L (ref 96–112)
Creatinine, Ser: 0.63 mg/dL (ref 0.50–1.10)
GFR calc Af Amer: >90 mL/min (ref >90)
GFR calc Af Amer: >90 mL/min (ref >90)
GFR calc non Af Amer: >90 mL/min (ref >90)
Glucose, Bld: 57 mg/dL — ABNORMAL LOW (ref 70–99)
Glucose, Bld: 205 mg/dL — ABNORMAL HIGH (ref 70–99)
Potassium: 3.8 mEq/L (ref 3.7–5.3)
Potassium: 3.9 mEq/L (ref 3.7–5.3)
SODIUM: 140 meq/L (ref 137–147)
Sodium: 138 mEq/L (ref 137–147)
TOTAL PROTEIN: 7.2 g/dL (ref 6.0–8.3)
TOTAL PROTEIN: 8 g/dL (ref 6.0–8.3)
Total Bilirubin: 0.6 mg/dL (ref 0.3–1.2)
Total Bilirubin: 0.6 mg/dL (ref 0.3–1.2)

## 2014-03-08 LAB — URINE CULTURE: Colony Count: 3000

## 2014-03-08 LAB — CBC
HCT: 28.7 % — ABNORMAL LOW (ref 36.0–46.0)
HCT: 32.4 % — ABNORMAL LOW (ref 36.0–46.0)
Hemoglobin: 9.2 g/dL — ABNORMAL LOW (ref 12.0–15.0)
Hemoglobin: 10.5 g/dL — ABNORMAL LOW (ref 12.0–15.0)
MCH: 26.2 pg (ref 26.0–34.0)
MCH: 26.5 pg (ref 26.0–34.0)
MCHC: 32.1 g/dL (ref 30.0–36.0)
MCHC: 32.4 g/dL (ref 30.0–36.0)
MCV: 81.8 fL (ref 78.0–100.0)
MCV: 81.8 fL (ref 78.0–100.0)
Platelets: 165 10*3/uL (ref 150–400)
Platelets: 181 10*3/uL (ref 150–400)
RBC: 3.51 MIL/uL — ABNORMAL LOW (ref 3.87–5.11)
RBC: 3.96 MIL/uL (ref 3.87–5.11)
RDW: 16.7 % — ABNORMAL HIGH (ref 11.5–15.5)
RDW: 16.6 % — ABNORMAL HIGH (ref 11.5–15.5)
WBC: 5.3 10*3/uL (ref 4.0–10.5)
WBC: 5.9 10*3/uL (ref 4.0–10.5)

## 2014-03-08 LAB — GLUCOSE, CAPILLARY
GLUCOSE-CAPILLARY: 57 mg/dL — AB (ref 70–99)
GLUCOSE-CAPILLARY: 89 mg/dL (ref 70–99)
Glucose-Capillary: 220 mg/dL — ABNORMAL HIGH (ref 70–99)
Glucose-Capillary: 156 mg/dL — ABNORMAL HIGH (ref 70–99)
Glucose-Capillary: 51 mg/dL — ABNORMAL LOW (ref 70–99)
Glucose-Capillary: 94 mg/dL (ref 70–99)

## 2014-03-08 MED ORDER — INSULIN GLARGINE 100 UNIT/ML ~~LOC~~ SOLN
25.0000 [IU] | Freq: Every day | SUBCUTANEOUS | Status: DC
  Filled 2014-03-08 (×2): qty 0.25

## 2014-03-08 NOTE — Progress Notes (Signed)
Hypoglycemic Event  CBG: 51  Treatment: 15 GM carbohydrate snack  Symptoms: None  Follow-up CBG: Time:10:27 PM CBG Result:94  Possible Reasons for Event: Inadequate meal intake  Comments/MD notified:K.Schorr, NP    [name] [name]  Remember to initiate Hypoglycemia Order Set & complete

## 2014-03-08 NOTE — Significant Event (Signed)
Please note patient's CBG low at 57 @ 0742, given carton of milk and packet of graham crackers and PB, repeat CBG at 0830 is 89, patient asymptomatic of hypoglycemic event, please see diabetic coordinator recommendations, Hazle Nordmann RN

## 2014-03-08 NOTE — Progress Notes (Signed)
Daily Rounding Note  03/08/2014, 8:18 AM  LOS: 1 day   SUBJECTIVE:       Eating better, no nausea.  Abdominal pain improved.  Received 4 mg  Morphine and 1 mg Dilaudid yesterday, no narcotics today.  No BM since admission.  Ate the majority of her solid breakfast.   OBJECTIVE:         Vital signs in last 24 hours:    Temp:  [97.9 F (36.6 C)-99.3 F (37.4 C)] 99.3 F (37.4 C) (04/21 0505) Pulse Rate:  [56-80] 68 (04/21 0505) Resp:  [16-18] 18 (04/21 0505) BP: (144-176)/(62-97) 150/73 mmHg (04/21 0505) SpO2:  [91 %-100 %] 99 % (04/21 0505) Weight:  [66.361 kg (146 lb 4.8 oz)-152.681 kg (336 lb 9.6 oz)] 152.681 kg (336 lb 9.6 oz) (04/20 1836) Last BM Date: 03/06/14 General: looks well, comfortable   Heart: RRR Chest: clear, unlabored breathing Abdomen: soft, ND, NT, active BS  Extremities: no CCE Neuro/Psych:  Easily agitated, oriented x 3, no tremor.  Not somnolent.  Very HOH.   Intake/Output from previous day:    Intake/Output this shift:    Lab Results:  Recent Labs  03/07/14 0553 03/08/14 0636  WBC 5.7 5.3  HGB 10.8* 9.2*  HCT 34.0* 28.7*  PLT 184 165   BMET  Recent Labs  03/07/14 0553 03/08/14 0636  NA 139 140  K 4.0 3.8  CL 101 105  CO2 22 22  GLUCOSE 172* 57*  BUN 16 11  CREATININE 0.68 0.63  CALCIUM 9.8 8.9   LFT  Recent Labs  03/07/14 0553 03/08/14 0636  PROT 8.8* 7.2  ALBUMIN 3.5 2.8*  AST 200* 98*  ALT 174* 113*  ALKPHOS 1135* 803*  BILITOT 1.2 0.6    Studies/Results: Ct Abdomen Pelvis W Wo Contrast  03/07/2014     FINDINGS: Lung bases show scattered scarring and/or atelectasis. Heart is enlarged. Coronary artery calcification. No pericardial or pleural effusion.  Pneumobilia, as before. Small perihepatic ascites. A wall stent is again seen in the common bile duct. 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 Gallbladder is unremarkable. Adrenal  glands are unremarkable. Low-attenuation lesions in the kidneys measure up to 2.2 cm on the left, stable from 05/28/2013. The largest lesion shows no enhancement, consistent with a cyst. Kidneys and spleen are otherwise unremarkable.  A well-defined pancreatic mass is not readily identified although the pancreatic head appears somewhat prominent. Associated atrophy of the pancreatic body and tail. Pancreatic duct measures up to 7 mm. The peripyloric region of the stomach appears thickened (series 301, image 77). Visualized portions of in small bowel and colon are unremarkable. Haziness and nodularity are seen in the small bowel mesentery, nonspecific. A small filling defect in the proximal superior mesenteric vein (series 301, image 86), less prominent than on the prior exam. Atherosclerotic calcification of the arterial vasculature without abdominal aortic aneurysm. No worrisome lytic or sclerotic lesions. Degenerative changes are seen in the spine.  IMPRESSION: 1. A wall stent remains within the common bile duct with associated pneumobilia. Although a well-defined pancreatic mass is not readily appreciated, secondary signs of a pancreatic head mass include atrophy and ductal dilatation involving the body and tail of the pancreas. Findings are grossly stable from the prior exam. 2. Small perihepatic ascites. 3. Chronic portal vein thrombosis with cavernous transformation. Small filling defect in the proximal superior mesenteric vein, less prominent than on the prior exam, indicative of thrombosis.  4. Peripyloric region of the stomach appears thickened. Please correlate clinically.   Electronically Signed   By: Lorin Picket M.D.   On: 03/07/2014 19:32   US Abdomen Complete 03/07/2014  FINDINGS: Gallbladder:  Gallbladder sludge. Mild gallbladder wall thickening, measuring 4 mm. No pericholecystic fluid. Negative sonographic Murphy's sign.  Common bile duct:  Diameter: 7 mm.  Indwelling biliary stent.  Liver:   Pneumobilia. Within normal limits for parenchymal echogenicity. No focal hepatic lesion is seen.  IVC:  No abnormality visualized.  Pancreas:  Poorly visualized.  Spleen:  Measures 8.3 cm.  Splenule measuring 1.6 cm.  Right Kidney:  Length: 11.3 cm.  No mass or hydronephrosis.  Left Kidney:  Length: 11.7 cm. 1.8 x 1.5 x 1.7 cm lower pole cyst. No hydronephrosis.  Abdominal aorta:  No aneurysm visualized.  Other findings:  None.  IMPRESSION: Pancreas is poorly visualized by ultrasound.  Gallbladder sludge with mild gallbladder wall thickening. No associated findings to suggest acute cholecystitis.  Pneumobilia with indwelling common duct stent.   Electronically Signed   By: Julian Hy M.D.   On: 03/07/2014 08:28    ASSESMENT:   * Pancreatic adenocarcinoma diagnosed 05/2013. Ongoing abdominal pain with acute rise in LFTs.  05/24/13 ERCP and plastic biliary stent.  Chemo/radiation 9/2 - 9/22.  More chemo in plans per Dr Ashok Cordia last note.   10/21/13 ERCP and stent exchange with uncovered metal stent.  02/10/14 EUS, celiac nerve block. This did not help the pain.  Ultrasound with GB sludge and wall thickening.  CT scan without gross metastatic changes or enlarging mass, duct patent.  LFTs improved.  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 On once daily Protonix, Carafate at home. Also takes 800 mg advil daily. Vomited blood once over the weekend. CT with thickened prepylorus.    * Pancreatic exocrine insufficiency. On pancrease with resolved loose, fatty stools.  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  * DM 2. Insulin requiring.  * Hearing loss, chronic.     PLAN   *  Per Dr Henrene Pastor. See below    Vena Rua  03/08/2014, 8:18 AM Pager: [phone]  GI ATTENDING  Interval history and data reviewed. CT scan reviewed. Patient seen and examined. Agree with H&P as outlined above.  IMPRESSION: 1. Pancreatic cancer with a history  of biliary obstruction status post biliary stent placement 2. No evidence for significant biliary obstruction at this point as evidenced by pneumobilia and nondilated bile ducts (supporting patency of biliary stent) as well as normal bilirubin 3. Abdominal pain. May be related to pancreatic cancer. Could be ulcer (seen previously on Endoscopy).  Recommendations: 1. Twice a day PPI indefinitely 2. Avoid NSAIDs indefinitely 3. Pain management per primary service. This is going to be a long-term issue. Celiac plexus block did not help. 4. Resume outpatient care with her oncologist.  Will sign off. Thank you.  Docia Chuck. Geri Seminole., M.D. [facility] Division of Gastroenterology

## 2014-03-08 NOTE — Progress Notes (Signed)
[facility] PROGRESS NOTE  [name] [name] DZH:[id number] DOB: 1956/08/03 DOA: 03/07/2014 PCP: Philis Fendt, MD  HPI/Subjective: Feeling much better today.  Some persistent, diffuse, aching, abdominal discomfort.  She is eating, drinking, urinating and passing gas.  Ambulating to bathroom.  No CP, SOB, N/V, hematemesis.   Assessment/Plan:  Chronic Abdominal pain with elevated LFTs  -Alkaline phosphatase 803, AST 98, ALT 113 (all down from admission (1135/200/174)).  T.Billi WNL -ERCP in December 2014 with stent exchanged to metal stent  -Celiac nerve block in March 2015  -Gastroenterology recommended CT abd/pelvis (see below) ?EGD Vs HIDA. -Heart healthy diet - hold before EGD -Continue Protonix and Carafate  -Abdominal ultrasound: (see below)  -Pain control -Ketoconazole and statin currently held   History of pancreatic adenocarcinoma  -Diagnosed in July 2014 - radiation chemotherapy September 2014  -Patient will need followup with Dr. Benay Spice as an outpatient   Diabetes mellitus type 2  -Continue home insulin regimen with Lantus and NovoLog, we'll also place on sliding insulin scale and CBG monitoring -Adjust for hypoglycemia  Hypertension  -Currently controlled  -Continue amlodipine, Lopressor, and lisinopril   GERD  -Continue Protonix   Hyperlipidemia  -Hold statin due to elevated LFTs   Chronic back pain  -Continue pain control   Anemia  -Baseline hemoglobin appears to be 8, currently 9.2 -No report/evidence of bleeding at this time.  Monitor CBC   Code Status: Full Family Communication: none Disposition Plan: home  Consultants:  Gastroenterology  Oncology  Procedures:  None  Antibiotics:  None    Objective: Filed Vitals:   03/08/14 0505  BP: 150/73  Pulse: 68  Temp: 99.3 F (37.4 C)  Resp: 18    Intake/Output Summary (Last 24 hours) at 03/08/14 0957 Last data filed at 03/08/14 0900  Gross per 24 hour  Intake    120 ml  Output       0 ml  Net    120 ml   Filed Weights   03/07/14 1137 03/07/14 1836  Weight: 66.361 kg (146 lb 4.8 oz) 152.681 kg (336 lb 9.6 oz)    Exam:   Physical Exam General: Alert, and oriented x3.  NAD HEENT: Normocephalic/Atraumatic, anicteric sclera, PERRLA, Oropharynx free of exudate Neck: Full ROM, no JVD Lungs: Breathing unlabored, diminished, but CTA b/l anterior/posterior CV: RRR, no m/g/r appreciated Abdomen: mildly distended, firm not rigid, +BS, mild diffuse tenderness Extremities: No edema, warm to touch Skin: No rash or lesions Neuro: No focal deficits  Data Reviewed: Basic Metabolic Panel:  Recent Labs Lab 03/07/14 0553 03/08/14 0636  NA 139 140  K 4.0 3.8  CL 101 105  CO2 22 22  GLUCOSE 172* 57*  BUN 16 11  CREATININE 0.68 0.63  CALCIUM 9.8 8.9   Liver Function Tests:  Recent Labs Lab 03/07/14 0553 03/08/14 0636  AST 200* 98*  ALT 174* 113*  ALKPHOS 1135* 803*  BILITOT 1.2 0.6  PROT 8.8* 7.2  ALBUMIN 3.5 2.8*    Recent Labs Lab 03/07/14 0553  LIPASE 9*   No results found for this basename: AMMONIA,  in the last 168 hours CBC:  Recent Labs Lab 03/07/14 0553 03/08/14 0636  WBC 5.7 5.3  NEUTROABS 4.3  --   HGB 10.8* 9.2*  HCT 34.0* 28.7*  MCV 82.3 81.8  PLT 184 165   Cardiac Enzymes: No results found for this basename: CKTOTAL, CKMB, CKMBINDEX, TROPONINI,  in the last 168 hours BNP (last 3 results) No results found for this basename: PROBNP,  in the last 8760 hours CBG:  Recent Labs Lab 03/07/14 1735 03/07/14 1752 03/07/14 2129 03/08/14 0742 03/08/14 0835  GLUCAP 63* 74 183* 57* 89    No results found for this or any previous visit (from the past 240 hour(s)).   Studies: Ct Abdomen Pelvis W Wo Contrast  03/07/2014   CLINICAL DATA:  Pancreatic cancer, RO a rising liver function tests.  EXAM: CT ABDOMEN WITHOUT AND WITH CONTRAST  TECHNIQUE: Multidetector CT imaging of the abdomen was performed without contrast material in  one or both body regions, followed by contrast material(s) and further sections in one or both body regions.  CONTRAST:  126mL OMNIPAQUE IOHEXOL 300 MG/ML  SOLN  COMPARISON:  US ABDOMEN COMPLETE dated 03/07/2014; CT ABD/PELVIS W CM dated 01/03/2014; CT ABD/PELVIS W CM dated 05/28/2013; CT ABD/PELVIS W CM dated 12/22/2013  FINDINGS: Lung bases show scattered scarring and/or atelectasis. Heart is enlarged. Coronary artery calcification. No pericardial or pleural effusion.  Pneumobilia, as before. Small perihepatic ascites. A wall stent is again seen in the common bile duct. 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 Gallbladder is unremarkable. Adrenal glands are unremarkable. Low-attenuation lesions in the kidneys measure up to 2.2 cm on the left, stable from 05/28/2013. The largest lesion shows no enhancement, consistent with a cyst. Kidneys and spleen are otherwise unremarkable.  A well-defined pancreatic mass is not readily identified although the pancreatic head appears somewhat prominent. Associated atrophy of the pancreatic body and tail. Pancreatic duct measures up to 7 mm. The peripyloric region of the stomach appears thickened (series 301, image 77). Visualized portions of in small bowel and colon are unremarkable. Haziness and nodularity are seen in the small bowel mesentery, nonspecific. A small filling defect in the proximal superior mesenteric vein (series 301, image 86), less prominent than on the prior exam. Atherosclerotic calcification of the arterial vasculature without abdominal aortic aneurysm. No worrisome lytic or sclerotic lesions. Degenerative changes are seen in the spine.  IMPRESSION: 1. A wall stent remains within the common bile duct with associated pneumobilia. Although a well-defined pancreatic mass is not readily appreciated, secondary signs of a pancreatic head mass include atrophy and ductal dilatation involving the body and tail of the pancreas. Findings are  grossly stable from the prior exam. 2. Small perihepatic ascites. 3. Chronic portal vein thrombosis with cavernous transformation. Small filling defect in the proximal superior mesenteric vein, less prominent than on the prior exam, indicative of thrombosis. 4. Peripyloric region of the stomach appears thickened. Please correlate clinically.   Electronically Signed   By: Lorin Picket M.D.   On: 03/07/2014 19:32   US Abdomen Complete  03/07/2014   CLINICAL DATA:  Pancreatic cancer, elevated LFTs  EXAM: ULTRASOUND ABDOMEN COMPLETE  COMPARISON:  CT abdomen pelvis dated 01/03/2014  FINDINGS: Gallbladder:  Gallbladder sludge. Mild gallbladder wall thickening, measuring 4 mm. No pericholecystic fluid. Negative sonographic Murphy's sign.  Common bile duct:  Diameter: 7 mm.  Indwelling biliary stent.  Liver:  Pneumobilia. Within normal limits for parenchymal echogenicity. No focal hepatic lesion is seen.  IVC:  No abnormality visualized.  Pancreas:  Poorly visualized.  Spleen:  Measures 8.3 cm.  Splenule measuring 1.6 cm.  Right Kidney:  Length: 11.3 cm.  No mass or hydronephrosis.  Left Kidney:  Length: 11.7 cm. 1.8 x 1.5 x 1.7 cm lower pole cyst. No hydronephrosis.  Abdominal aorta:  No aneurysm visualized.  Other findings:  None.  IMPRESSION: Pancreas is poorly visualized by ultrasound.  Gallbladder sludge with mild gallbladder wall thickening. No associated findings to suggest acute cholecystitis.  Pneumobilia with indwelling common duct stent.   Electronically Signed   By: Julian Hy M.D.   On: 03/07/2014 08:28    Scheduled Meds: . amLODipine  10 mg Oral Daily  . aspirin  81 mg Oral Daily  . gabapentin  300 mg Oral TID  . insulin aspart  0-15 Units Subcutaneous TID WC  . insulin aspart  10 Units Subcutaneous TID WC  . insulin glargine  30 Units Subcutaneous QHS  . lipase/protease/amylase  1 capsule Oral TID AC  . lisinopril  40 mg Oral Daily  . loratadine  10 mg Oral Daily  . metoCLOPramide  5  mg Oral QID  . metoprolol tartrate  25 mg Oral BID  . pantoprazole  40 mg Oral Q breakfast  . potassium chloride  10 mEq Oral Daily  . sucralfate  1 g Oral BID   Continuous Infusions: . sodium chloride 100 mL/hr at 03/08/14 5277    Active Problems:   Abdominal pain   Biliary obstruction   Time spent: [place], Student-PA   [facility]  If 7PM-7AM, please contact night-coverage at [email or web address], password [facility] 03/08/2014, 9:57 AM  LOS: 1 day     Addendum  Patient seen and examined, chart and data base reviewed.  I agree with the above assessment and plan.  For full details please see Mr. Rudean Haskell, Student-PA note.  I reviewed and amended the above note as needed.   Birdie Hopes, MD [facility] Pager: [phone] 03/08/2014, 12:58 PM

## 2014-03-08 NOTE — Progress Notes (Signed)
IP PROGRESS NOTE  Subjective:   [name] [name] is known to me with a history of pancreas cancer. She was admitted yesterday with increased abdominal pain. She reports the pain is better today.  Objective: Vital signs in last 24 hours: Blood pressure 150/73, pulse 68, temperature 99.3 F (37.4 C), temperature source Oral, resp. rate 18, height 5\' 6"  (1.676 m), weight 336 lb 9.6 oz (152.681 kg), SpO2 99.00%.  Intake/Output from previous day:    Physical Exam:  HEENT: Neck without mass Lungs: Clear bilaterally Cardiac: Regular rate and rhythm Abdomen: No hepatomegaly, no mass, mild tenderness in the mid upper abdomen Extremities: No leg edema   Lab Results:  Recent Labs  03/07/14 0553 03/08/14 0636  WBC 5.7 5.3  HGB 10.8* 9.2*  HCT 34.0* 28.7*  PLT 184 165    BMET  Recent Labs  03/07/14 0553 03/08/14 0636  NA 139 140  K 4.0 3.8  CL 101 105  CO2 22 22  GLUCOSE 172* 57*  BUN 16 11  CREATININE 0.68 0.63  CALCIUM 9.8 8.9    Studies/Results: Ct Abdomen Pelvis W Wo Contrast  03/07/2014   CLINICAL DATA:  Pancreatic cancer, RO a rising liver function tests.  EXAM: CT ABDOMEN WITHOUT AND WITH CONTRAST  TECHNIQUE: Multidetector CT imaging of the abdomen was performed without contrast material in one or both body regions, followed by contrast material(s) and further sections in one or both body regions.  CONTRAST:  184mL OMNIPAQUE IOHEXOL 300 MG/ML  SOLN  COMPARISON:  US ABDOMEN COMPLETE dated 03/07/2014; CT ABD/PELVIS W CM dated 01/03/2014; CT ABD/PELVIS W CM dated 05/28/2013; CT ABD/PELVIS W CM dated 12/22/2013  FINDINGS: Lung bases show scattered scarring and/or atelectasis. Heart is enlarged. Coronary artery calcification. No pericardial or pleural effusion.  Pneumobilia, as before. Small perihepatic ascites. A wall stent is again seen in the common bile duct. 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 Gallbladder is unremarkable. Adrenal glands are  unremarkable. Low-attenuation lesions in the kidneys measure up to 2.2 cm on the left, stable from 05/28/2013. The largest lesion shows no enhancement, consistent with a cyst. Kidneys and spleen are otherwise unremarkable.  A well-defined pancreatic mass is not readily identified although the pancreatic head appears somewhat prominent. Associated atrophy of the pancreatic body and tail. Pancreatic duct measures up to 7 mm. The peripyloric region of the stomach appears thickened (series 301, image 77). Visualized portions of in small bowel and colon are unremarkable. Haziness and nodularity are seen in the small bowel mesentery, nonspecific. A small filling defect in the proximal superior mesenteric vein (series 301, image 86), less prominent than on the prior exam. Atherosclerotic calcification of the arterial vasculature without abdominal aortic aneurysm. No worrisome lytic or sclerotic lesions. Degenerative changes are seen in the spine.  IMPRESSION: 1. A wall stent remains within the common bile duct with associated pneumobilia. Although a well-defined pancreatic mass is not readily appreciated, secondary signs of a pancreatic head mass include atrophy and ductal dilatation involving the body and tail of the pancreas. Findings are grossly stable from the prior exam. 2. Small perihepatic ascites. 3. Chronic portal vein thrombosis with cavernous transformation. Small filling defect in the proximal superior mesenteric vein, less prominent than on the prior exam, indicative of thrombosis. 4. Peripyloric region of the stomach appears thickened. Please correlate clinically.   Electronically Signed   By: Lorin Picket M.D.   On: 03/07/2014 19:32   US Abdomen Complete  03/07/2014   CLINICAL DATA:  Pancreatic cancer, elevated LFTs  EXAM: ULTRASOUND ABDOMEN COMPLETE  COMPARISON:  CT abdomen pelvis dated 01/03/2014  FINDINGS: Gallbladder:  Gallbladder sludge. Mild gallbladder wall thickening, measuring 4 mm. No  pericholecystic fluid. Negative sonographic Murphy's sign.  Common bile duct:  Diameter: 7 mm.  Indwelling biliary stent.  Liver:  Pneumobilia. Within normal limits for parenchymal echogenicity. No focal hepatic lesion is seen.  IVC:  No abnormality visualized.  Pancreas:  Poorly visualized.  Spleen:  Measures 8.3 cm.  Splenule measuring 1.6 cm.  Right Kidney:  Length: 11.3 cm.  No mass or hydronephrosis.  Left Kidney:  Length: 11.7 cm. 1.8 x 1.5 x 1.7 cm lower pole cyst. No hydronephrosis.  Abdominal aorta:  No aneurysm visualized.  Other findings:  None.  IMPRESSION: Pancreas is poorly visualized by ultrasound.  Gallbladder sludge with mild gallbladder wall thickening. No associated findings to suggest acute cholecystitis.  Pneumobilia with indwelling common duct stent.   Electronically Signed   By: Julian Hy M.D.   On: 03/07/2014 08:28    Medications: I have reviewed the patient's current medications.  Assessment/Plan: 1. 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  Initiation of radiation and Xeloda 07/20/2013 with completion 08/09/2013.  CT scans abdomen/pelvis done 11/01/2013 for evaluation of increased abdominal pain showed stable infiltrating pancreatic mass. No progression identified. Stable peripancreatic lymph nodes. 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 No evidence of metastatic disease.  Pancreatic head mass stable on CT 01/03/2014 and 03/07/2014 2. Obstructive jaundice secondary to #1. Status post placement of a bile duct stent on 05/24/2013. Status post placement of a metal stent 10/21/2013. 3. Pain -likely secondary to pancreas cancer, no improvement following the celiac plexus nerve block 02/10/2014 4. Diabetes. 5. History of chronic pancreatitis. 6. History of alcohol use. 7. History of tobacco  use. 8. Anxiety/depression. 9. Cutaneous nodule at the left flank. Question cyst, question metastasis. 10. Abnormal EKG, question Brugada syndrome. 11. Hospitalization 08/12/2013 through 08/15/2013 with nausea/vomiting and abdominal pain. Improved. 12. 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 Portal vein patent. New wall thickening of the mid to distal stomach and the hepatic flexure and proximal transverse colon. 13. 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 She discontinued Lovenox due to abdominal wall ecchymoses and hematomas. 14. Admission 12/22/2013 with strep viridans bacteremia status post 4 week course of ceftriaxone.  15. Status post celiac plexus nerve block 02/10/2014.  16. 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 She started Pancrease after an office visit 02/07/2014 17. Elevated alkaline phosphatase and liver enzymes-evaluation per Dr. Henrene Pastor  [name] [name] has locally advanced pancreas cancer. I suspect the pain is secondary to pancreas cancer as opposed to chronic pancreatitis or another etiology. The pain did not improve with the celiac block. She has an unresectable tumor and has been treated with Xeloda/radiation. We will consider a trial of palliative chemotherapy with gemcitabine/Abraxane.  Recommendations: 1. Check CA19-9 2. Further evaluation of the elevated alkaline phosphatase and liver enzymes per Dr. Henrene Pastor 3. Narcotic analgesics for pain 4. Outpatient followup is scheduled at the [facility] 03/17/2014    LOS: 1 day   Ladell Pier  03/08/2014, 9:00 AM

## 2014-03-08 NOTE — Progress Notes (Signed)
Nutrition Brief Note  Patient identified on the Malnutrition Screening Tool (MST) Report. Pt with stable weight. Noted her weight on admission was incorrect - listed as 152 kg. RD clarified with RN and put in current correct weight. RD also notified RN Clinical Documentation Specialist given recent query. Pt is eating well.  Wt Readings from Last 15 Encounters:  03/08/14 152 lb (68.947 kg)  02/16/14 150 lb 6.4 oz (68.221 kg)  02/07/14 149 lb 14.4 oz (67.994 kg)  01/24/14 151 lb (68.493 kg)  01/21/14 153 lb (69.4 kg)  12/25/13 170 lb 13.7 oz (77.5 kg)  12/25/13 170 lb 13.7 oz (77.5 kg)  12/13/13 151 lb (68.493 kg)  12/06/13 153 lb 4.8 oz (69.536 kg)  11/01/13 149 lb (67.586 kg)  10/22/13 153 lb 3.5 oz (69.5 kg)  10/22/13 153 lb 3.5 oz (69.5 kg)  10/20/13 152 lb 4.8 oz (69.083 kg)  10/12/13 149 lb (67.586 kg)  10/12/13 151 lb (68.493 kg)    Body mass index is 24.55 kg/(m^2). Patient meets criteria for normal weight based on current BMI.   Current diet order is Heart Healthy/Carbohydrate Modified, patient is consuming approximately 100% of meals at this time. Labs and medications reviewed.   No nutrition interventions warranted at this time. If nutrition issues arise, please consult RD.   Inda Coke MS, RD, LDN Inpatient Registered Dietitian Pager: [phone] After-hours pager: [phone]

## 2014-03-08 NOTE — Clinical Documentation Improvement (Deleted)
Pt with BMI of 54.35  Best Practice Alert:    Please identify the underlying diagnosis responsible for an elevated BMI and document both the diagnosis and BMI in the medical record.   Possible Clinical conditions  Morbid Obesity W/ BMI  Underweight w/BMI  Other condition___________________  Cannot clinically determine _____________  Risk Factors: Sign & Symptoms: HT: 5\' 6"  (1.676 m)  WT: 336 lb 9.6 oz (152.681 kg)  BMI 54.35 kg/m2  Diagnostics: Treatment  Thank You, Heloise Beecham ,RN Clinical Documentation Specialist:  [place] Information Management

## 2014-03-08 NOTE — Progress Notes (Addendum)
Inpatient Diabetes Program Recommendations  AACE/ADA: New Consensus Statement on Inpatient Glycemic Control (2013)  Target Ranges:  Prepandial:   less than 140 mg/dL      Peak postprandial:   less than 180 mg/dL (1-2 hours)      Critically ill patients:  140 - 180 mg/dL     Results for [name] [name], [name] [name] (MRN [id number]) as of 03/08/2014 07:52  Ref. Range 03/07/2014 11:43 03/07/2014 17:35 03/07/2014 17:52 03/07/2014 21:29  Glucose-Capillary Latest Range: 70-99 mg/dL 171 (H) 63 (L) 74 183 (H)    Results for [name] [name], [name] [name] (MRN [id number]) as of 03/08/2014 08:27  Ref. Range 03/08/2014 07:42  Glucose-Capillary Latest Range: 70-99 mg/dL 57 (L)    Admitted with biliary obstruction.  History of Type 2 DM, pancreatic cancer, HTN, CVA  Home DM Meds: Lantus 30 units QHS + Novolog 10 units tid with meals    Note patient had hypoglycemia yesterday afternoon at supper after receiving 10 units Novolog meal coverage at lunch.  Patient also hypoglycemic this morning.   MD- Please consider the following:  1. Decrease patient's scheduled Novolog meal coverage to 1/2 home dose- Novolog 5 units tid with meals 2. Decrease Lantus to 25 units QHS     Will follow Wyn Quaker RN, MSN, CDE Diabetes Coordinator Inpatient Diabetes Program Team Pager: [phone] (8a-10p)

## 2014-03-08 NOTE — ED Provider Notes (Signed)
Medical screening examination/treatment/procedure(s) were conducted as a shared visit with non-physician practitioner(s) and myself.  I personally evaluated the patient during the encounter.   EKG Interpretation None      Pt with hx pancreatic ca, obstructive jaundice, hx cbd stent, c/o upper abdomen and mid back pain, similar to prior pain. abd soft, mild upper abd tenderness, no peritonitis. Labs w markedly elevated Alk Phos, elevated lfts.  U/s. Medical admission. Gi consult.   Mirna Mires, MD 03/08/14 [phone]

## 2014-03-08 NOTE — Care Management Note (Signed)
    Page 1 of 1   03/09/2014     12:13:31 PM CARE MANAGEMENT NOTE 03/09/2014  Patient:  [name] [name], [name] [name]   Account Number:  [id number]  Date Initiated:  03/08/2014  Documentation initiated by:  Tomi Bamberger  Subjective/Objective Assessment:   dx abd pain,elevated lfts  admit- lives with family.     Action/Plan:   Anticipated DC Date:  03/09/2014   Anticipated DC Plan:  [place]  CM consult      Choice offered to / List presented to:             Status of service:  Completed, signed off Medicare Important Message given?   (If response is "NO", the following Medicare IM given date fields will be blank) Date Medicare IM given:   Date Additional Medicare IM given:    Discharge Disposition:  HOME/SELF CARE  Per UR Regulation:  Reviewed for med. necessity/level of care/duration of stay  If discussed at [place] of Stay Meetings, dates discussed:    Comments:

## 2014-03-09 DIAGNOSIS — R0789 Other chest pain: Secondary | ICD-10-CM

## 2014-03-09 DIAGNOSIS — K920 Hematemesis: Secondary | ICD-10-CM

## 2014-03-09 LAB — GLUCOSE, CAPILLARY: GLUCOSE-CAPILLARY: 111 mg/dL — AB (ref 70–99)

## 2014-03-09 MED ORDER — PANTOPRAZOLE SODIUM 40 MG PO TBEC: 40.0000 mg | DELAYED_RELEASE_TABLET | Freq: Two times a day (BID) | ORAL | Status: DC

## 2014-03-09 MED ORDER — OXYCODONE HCL 5 MG PO CAPS: 5.0000 mg | ORAL_CAPSULE | Freq: Four times a day (QID) | ORAL | Status: DC | PRN

## 2014-03-09 NOTE — Progress Notes (Signed)
DC instructions given to pt.. Pt states that she understands. Daughter at bedside.

## 2014-03-09 NOTE — Discharge Summary (Signed)
Physician Discharge Summary  [name] [name] OZH:[id number] DOB: August 28, 1956 DOA: 03/07/2014  PCP: Philis Fendt, MD  Admit date: 03/07/2014 Discharge date: 03/09/2014  Time spent: 40 minutes  Recommendations for Outpatient Follow-up:  1. Followup with primary care physician in one week. 2. Followup with Dr. Benay Spice on 03/17/2014 at 11:45 AM.  Discharge Diagnoses:  Active Problems:   Abdominal pain   Biliary obstruction   Discharge Condition: Stable  Diet recommendation: Carb modified diet  Filed Weights   03/07/14 1137 03/07/14 1836 03/08/14 0900  Weight: 66.361 kg (146 lb 4.8 oz) 152.681 kg (336 lb 9.6 oz) 68.947 kg (152 lb)    History of present illness:  [name] [name] is a 58 y.o. female with a history of pancreatic cancer, history of obstructive jaundice with stent placement in December 2014, mild back pain that presents to the emergency room for abdominal pain. Patient states she's had burning pain and rates as an 8/10 which is been unchanged from weeks to months. She has had a celiac nerve block without any improvement. Patient states she has had approximately 3 episodes of vomiting with some blood over the weekend. She also complains of some right ear pain which occurred a few weeks ago. Per her daughter, patient attempted to clean her year with a Q-tip a few weeks ago. Of note patient was seen by surgery Dr. Barry Dienes and was found not to be a surgical candidate. She did receive radiation and chemotherapy for her cancer. She had a history of biliary occlusion with obstructing jaundice in July 2014 however had a metal stent placed in December 2014. Currently she denies any chest pain, shortness of breath.  Hospital Course:   Chronic Abdominal pain with elevated LFTs  -Patient presented to the hospital with worsening abdominal pain and nausea/vomiting. -Alkaline phosphatase 803, AST 98, ALT 113 (all down from admission (1135/200/174)). T.Billi WNL  -ERCP in December 2014 with stent  exchanged to metal stent  -Celiac nerve block in March 2015, patient didn't have a lot of pain relief afterwards.  -CT scan was done and showed no acute changes since last study. -She tolerated diet very well, denies any nausea vomiting. -Gastroenterology recommended to switch Protonix to twice a day. -Abdominal ultrasound: (see below).  -Discharge home on oxycodone 5 mg, patient to followup with her oncologist for further pain control.  Pancreatic adenocarcinoma  -Diagnosed in July 2014 - radiation chemotherapy September 2014  -Patient will need followup with Dr. Benay Spice as an outpatient.   Diabetes mellitus type 2  -Continue home insulin regimen with Lantus and NovoLog, we'll also place on sliding insulin scale and CBG monitoring  -Adjust for hypoglycemia   Hypertension  -Currently controlled  -Continue amlodipine, Lopressor, and lisinopril   GERD  -Continue Protonix, now switched to twice a day.   Hyperlipidemia  -Hold statin due to elevated LFTs   Chronic back pain  -Continue pain control   Anemia  -Baseline hemoglobin appears to be 8, currently 9.2  -No report/evidence of bleeding at this time. Monitor CBC     Procedures:  None  Consultations:  Medical oncology.  Gastroenterology  Discharge Exam: Filed Vitals:   03/09/14 1056  BP: 142/80  Pulse: 69  Temp:   Resp:    General: Alert and awake, oriented x3, not in any acute distress. HEENT: anicteric sclera, pupils reactive to light and accommodation, EOMI CVS: S1-S2 clear, no murmur rubs or gallops Chest: clear to auscultation bilaterally, no wheezing, rales or rhonchi Abdomen: soft nontender, nondistended, normal bowel  sounds, no organomegaly Extremities: no cyanosis, clubbing or edema noted bilaterally Neuro: Cranial nerves II-XII intact, no focal neurological deficits  Discharge Instructions You were cared for by a hospitalist during your hospital stay. If you have any questions about your  discharge medications or the care you received while you were in the hospital after you are discharged, you can call the unit and asked to speak with the hospitalist on call if the hospitalist that took care of you is not available. Once you are discharged, your primary care physician will handle any further medical issues. Please note that NO REFILLS for any discharge medications will be authorized once you are discharged, as it is imperative that you return to your primary care physician (or establish a relationship with a primary care physician if you do not have one) for your aftercare needs so that they can reassess your need for medications and monitor your lab values.      Discharge Orders   Future Appointments Provider Department Dept Phone   03/17/2014 11:45 AM Ladell Pier, MD [facility] [phone]   04/12/2014 2:00 PM Dorothy Spark, MD [facility] [phone]   Future Orders Complete By Expires   Diet - low sodium heart healthy  As directed    Increase activity slowly  As directed        Medication List    STOP taking these medications       traMADol 50 MG tablet  Commonly known as:  ULTRAM      TAKE these medications       ADVIL 200 MG tablet  Generic drug:  ibuprofen  Take 800 mg by mouth every 6 (six) hours as needed for mild pain or moderate pain.     amLODipine 10 MG tablet  Commonly known as:  NORVASC  Take 1 tablet (10 mg total) by mouth daily.     aspirin 81 MG tablet  Take 81 mg by mouth daily.     cetirizine 10 MG tablet  Commonly known as:  ZYRTEC  Take 10 mg by mouth daily.     gabapentin 300 MG capsule  Commonly known as:  NEURONTIN  Take 300 mg by mouth 3 (three) times daily.     insulin aspart 100 UNIT/ML injection  Commonly known as:  novoLOG  Inject 10 Units into the skin 3 (three) times daily with meals.     insulin glargine 100 UNIT/ML injection  Commonly known as:  LANTUS   Inject 30 Units into the skin at bedtime.     ketoconazole 200 MG tablet  Commonly known as:  NIZORAL  Take 200 mg by mouth daily.     lipase/protease/amylase 12000 UNITS Cpep capsule  Commonly known as:  CREON-12/PANCREASE  Take 1 capsule by mouth 3 (three) times daily before meals.     lisinopril 40 MG tablet  Commonly known as:  PRINIVIL,ZESTRIL  Take 40 mg by mouth daily.     metoCLOPramide 5 MG tablet  Commonly known as:  REGLAN  Take 5 mg by mouth 4 (four) times daily.     metoprolol tartrate 25 MG tablet  Commonly known as:  LOPRESSOR  Take 25 mg by mouth 2 (two) times daily.     oxycodone 5 MG capsule  Commonly known as:  OXY-IR  Take 1 capsule (5 mg total) by mouth every 6 (six) hours as needed for pain.     pantoprazole 40 MG tablet  Commonly known as:  PROTONIX  Take 1 tablet (40 mg total) by mouth 2 (two) times daily.     potassium chloride 10 MEQ tablet  Commonly known as:  K-DUR,KLOR-CON  Take 10 mEq by mouth daily.     simethicone 125 MG chewable tablet  Commonly known as:  MYLICON  Chew 811 mg by mouth every 6 (six) hours as needed for flatulence.     simvastatin 10 MG tablet  Commonly known as:  ZOCOR  Take 10 mg by mouth at bedtime.     sucralfate 1 GM/10ML suspension  Commonly known as:  CARAFATE  Take 10 mLs (1 g total) by mouth 2 (two) times daily.       Allergies  Allergen Reactions  . Betadine [Povidone Iodine] Itching and Rash   Follow-up Information   Follow up with AVBUERE,EDWIN A, MD In 1 week.   Specialty:  Internal Medicine   Contact information:   [address] [place] [address] [phone]        The results of significant diagnostics from this hospitalization (including imaging, microbiology, ancillary and laboratory) are listed below for reference.    Significant Diagnostic Studies: Ct Abdomen Pelvis W Wo Contrast  03/07/2014   CLINICAL DATA:  Pancreatic cancer, RO a rising liver function tests.  EXAM: CT  ABDOMEN WITHOUT AND WITH CONTRAST  TECHNIQUE: Multidetector CT imaging of the abdomen was performed without contrast material in one or both body regions, followed by contrast material(s) and further sections in one or both body regions.  CONTRAST:  145mL OMNIPAQUE IOHEXOL 300 MG/ML  SOLN  COMPARISON:  US ABDOMEN COMPLETE dated 03/07/2014; CT ABD/PELVIS W CM dated 01/03/2014; CT ABD/PELVIS W CM dated 05/28/2013; CT ABD/PELVIS W CM dated 12/22/2013  FINDINGS: Lung bases show scattered scarring and/or atelectasis. Heart is enlarged. Coronary artery calcification. No pericardial or pleural effusion.  Pneumobilia, as before. Small perihepatic ascites. A wall stent is again seen in the common bile duct. 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 Gallbladder is unremarkable. Adrenal glands are unremarkable. Low-attenuation lesions in the kidneys measure up to 2.2 cm on the left, stable from 05/28/2013. The largest lesion shows no enhancement, consistent with a cyst. Kidneys and spleen are otherwise unremarkable.  A well-defined pancreatic mass is not readily identified although the pancreatic head appears somewhat prominent. Associated atrophy of the pancreatic body and tail. Pancreatic duct measures up to 7 mm. The peripyloric region of the stomach appears thickened (series 301, image 77). Visualized portions of in small bowel and colon are unremarkable. Haziness and nodularity are seen in the small bowel mesentery, nonspecific. A small filling defect in the proximal superior mesenteric vein (series 301, image 86), less prominent than on the prior exam. Atherosclerotic calcification of the arterial vasculature without abdominal aortic aneurysm. No worrisome lytic or sclerotic lesions. Degenerative changes are seen in the spine.  IMPRESSION: 1. A wall stent remains within the common bile duct with associated pneumobilia. Although a well-defined pancreatic mass is not readily appreciated, secondary  signs of a pancreatic head mass include atrophy and ductal dilatation involving the body and tail of the pancreas. Findings are grossly stable from the prior exam. 2. Small perihepatic ascites. 3. Chronic portal vein thrombosis with cavernous transformation. Small filling defect in the proximal superior mesenteric vein, less prominent than on the prior exam, indicative of thrombosis. 4. Peripyloric region of the stomach appears thickened. Please correlate clinically.   Electronically Signed   By: Lorin Picket M.D.  On: 03/07/2014 19:32   US Abdomen Complete  03/07/2014   CLINICAL DATA:  Pancreatic cancer, elevated LFTs  EXAM: ULTRASOUND ABDOMEN COMPLETE  COMPARISON:  CT abdomen pelvis dated 01/03/2014  FINDINGS: Gallbladder:  Gallbladder sludge. Mild gallbladder wall thickening, measuring 4 mm. No pericholecystic fluid. Negative sonographic Murphy's sign.  Common bile duct:  Diameter: 7 mm.  Indwelling biliary stent.  Liver:  Pneumobilia. Within normal limits for parenchymal echogenicity. No focal hepatic lesion is seen.  IVC:  No abnormality visualized.  Pancreas:  Poorly visualized.  Spleen:  Measures 8.3 cm.  Splenule measuring 1.6 cm.  Right Kidney:  Length: 11.3 cm.  No mass or hydronephrosis.  Left Kidney:  Length: 11.7 cm. 1.8 x 1.5 x 1.7 cm lower pole cyst. No hydronephrosis.  Abdominal aorta:  No aneurysm visualized.  Other findings:  None.  IMPRESSION: Pancreas is poorly visualized by ultrasound.  Gallbladder sludge with mild gallbladder wall thickening. No associated findings to suggest acute cholecystitis.  Pneumobilia with indwelling common duct stent.   Electronically Signed   By: Julian Hy M.D.   On: 03/07/2014 08:28    Microbiology: Recent Results (from the past 240 hour(s))  URINE CULTURE     Status: None   Collection Time    03/07/14  5:53 AM      Result Value Ref Range Status   Specimen Description URINE, CLEAN CATCH   Final   Special Requests NONE   Final   Culture   Setup Time     Final   Value: 03/07/2014 15:10     Performed at [facility]     Final   Value: 3,000 COLONIES/ML     Performed at [facility]   Culture     Final   Value: INSIGNIFICANT GROWTH     Performed at [facility]   Report Status 03/08/2014 FINAL   Final     Labs: Basic Metabolic Panel:  Recent Labs Lab 03/07/14 0553 03/08/14 0636 03/08/14 1402  NA 139 140 138  K 4.0 3.8 3.9  CL 101 105 103  CO2 22 22 24   GLUCOSE 172* 57* 205*  BUN 16 11 10   CREATININE 0.68 0.63 0.61  CALCIUM 9.8 8.9 9.3   Liver Function Tests:  Recent Labs Lab 03/07/14 0553 03/08/14 0636 03/08/14 1402  AST 200* 98* 94*  ALT 174* 113* 116*  ALKPHOS 1135* 803* 872*  BILITOT 1.2 0.6 0.6  PROT 8.8* 7.2 8.0  ALBUMIN 3.5 2.8* 3.1*    Recent Labs Lab 03/07/14 0553  LIPASE 9*   No results found for this basename: AMMONIA,  in the last 168 hours CBC:  Recent Labs Lab 03/07/14 0553 03/08/14 0636 03/08/14 1402  WBC 5.7 5.3 5.9  NEUTROABS 4.3  --   --   HGB 10.8* 9.2* 10.5*  HCT 34.0* 28.7* 32.4*  MCV 82.3 81.8 81.8  PLT 184 165 181   Cardiac Enzymes: No results found for this basename: CKTOTAL, CKMB, CKMBINDEX, TROPONINI,  in the last 168 hours BNP: BNP (last 3 results) No results found for this basename: PROBNP,  in the last 8760 hours CBG:  Recent Labs Lab 03/08/14 1201 03/08/14 1721 03/08/14 2148 03/08/14 2227 03/09/14 0748  GLUCAP 220* 156* 51* 94 111*       Signed:  Harvin Konicek  [facility] 03/09/2014, 11:39 AM

## 2014-03-09 NOTE — Progress Notes (Signed)
Inpatient Diabetes Program Recommendations  AACE/ADA: New Consensus Statement on Inpatient Glycemic Control (2013)  Target Ranges:  Prepandial:   less than 140 mg/dL      Peak postprandial:   less than 180 mg/dL (1-2 hours)      Critically ill patients:  140 - 180 mg/dL     Results for JADASIA, HAWS (MRN [id number]) as of 03/09/2014 07:42  Ref. Range 03/07/2014 11:43 03/07/2014 17:35 03/07/2014 17:52 03/07/2014 21:29  Glucose-Capillary Latest Range: 70-99 mg/dL 171 (H) 63 (L) 74 183 (H)     Results for SHIVANI, BARRANTES (MRN [id number]) as of 03/09/2014 07:42  Ref. Range 03/08/2014 07:42 03/08/2014 08:35 03/08/2014 12:01 03/08/2014 17:21 03/08/2014 21:48 03/08/2014 22:27  Glucose-Capillary Latest Range: 70-99 mg/dL 57 (L) 89 220 (H) 156 (H) 51 (L) 94    Patient with hypoglycemic events over last two days:  Hypoglycemic at supper on 04/20 after receiving 10 units scheduled Novolog meal coverage at lunch that same day.  Hypoglycemic yesterday AM (04/21) after receiving 30 units of Lantus at bedtime the night prior.  Hypoglycemic yesterday at bedtime (04/21) after receiving 10 units scheduled Novolog meal coverage at supper time.   **Note that MD decreased Lantus to 25 units QHS for last night, however, RN held Lantus dose last night.  NO Lantus given last night (04/21).  Expect patient's CBGs could be elevated today since RN held Lantus last night.   MD- Please consider decreasing scheduled Novolog meal coverage to 1/2 patient's home dose- Novolog 5 units tid with meals   Will follow Wyn Quaker RN, MSN, CDE Diabetes Coordinator Inpatient Diabetes Program Team Pager: [phone] (8a-10p)

## 2014-03-11 ENCOUNTER — Telehealth: Payer: Self-pay | Admitting: *Deleted

## 2014-03-11 NOTE — Telephone Encounter (Signed)
Received 2 messages from pt's daughter requesting return call. Called number listed- no answer.

## 2014-03-11 NOTE — Telephone Encounter (Signed)
Pt's daughter returned call, stated pt is planning to move to [place], [employer]. Asking for her records to be forwarded. Instructed her to contact office with name and address of doctor in [place]. She voiced understanding.

## 2014-03-15 IMAGING — PT NM PET TUM IMG INITIAL (PI) SKULL BASE T - THIGH
3 series · 25 of 25 positions shown · non-contrast
Comparison: CT of the abdomen and pelvis 05/28/2013.

CLINICAL DATA: Initial treatment strategy for pancreatic cancer.

NUCLEAR MEDICINE PET SKULL BASE TO THIGH
Fasting Blood Glucose:  82
TECHNIQUE: 14.5 mCi F-18 FDG was injected intravenously. CT data
was obtained and used for attenuation correction and anatomic
localization only.  (This was not acquired as a diagnostic CT
examination.) Additional exam technical data entered on
technologist worksheet.

[Series 2: pet nac · axial · 3.3mm · 4.69mm/px · z∈[-917,-47]mm · 19 of 267 slices shown]
[im 1/267]
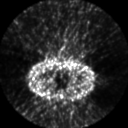
[im 15/267]
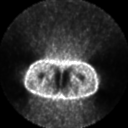
[im 30/267]
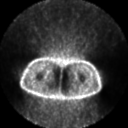
[im 45/267]
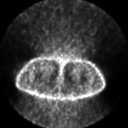
[im 60/267]
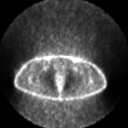
[im 74/267]
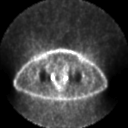
[im 89/267]
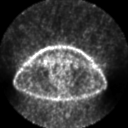
[im 104/267]
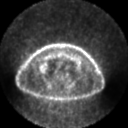
[im 119/267]
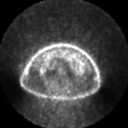
[im 134/267]
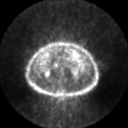
[im 148/267]
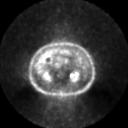
[im 163/267]
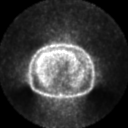
[im 178/267]
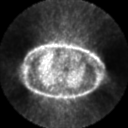
[im 193/267]
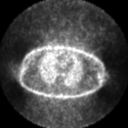
[im 207/267]
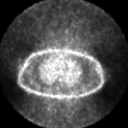
[im 222/267]
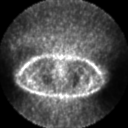
[im 237/267]
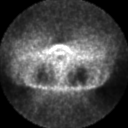
[im 252/267]
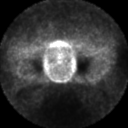
[im 267/267]
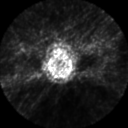

[Series 123: mip · coronal · 3.3mm · 4.69mm/px · 2 of 30 slices shown]
[im 1/30]
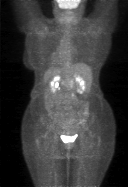
[im 30/30]
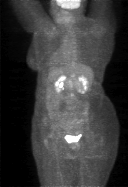

[Series 153: reformatted · coronal · 4.7mm · 6.98mm/px · 4 of 64 slices shown]
[im 1/64]
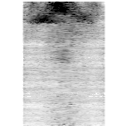
[im 22/64]
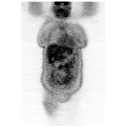
[im 43/64]
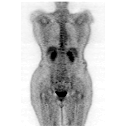
[im 64/64]
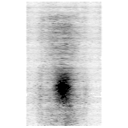

[25 of 25 positions shown; findings below may reference images not displayed]

FINDINGS: Neck: No hypermetabolic lymph nodes in the neck.

Chest:  No hypermetabolic mediastinal or hilar nodes.  No
suspicious pulmonary nodules on the CT scan. Heart size is mildly
enlarged. There is no significant pericardial fluid, thickening or
pericardial calcification. There is atherosclerosis of the thoracic
aorta, the great vessels of the mediastinum and the coronary
arteries, including calcified atherosclerotic plaque in the left
main, left anterior descending, left circumflex and right coronary
arteries. Esophagus is unremarkable in appearance.  Linear opacity
in the anterior segment of the lingula may represent subsegmental
atelectasis or scarring.

Abdomen/Pelvis:  A poorly defined mass is noted throughout the head
and body of the pancreas, similar to the prior examination.  This
is diffusely hypermetabolic (SUVmax = 8.6).  This mass measures
approximately 7.1 x 3.3 cm on today's examination.  A stent is
present within the common bile duct.  There is extensive
hypermetabolism around the stent, which may be indicative of tumor,
or may be indicative of reactive inflammation (SUVmax = 7.7 - 7.8).
A large amount of residual contrast material is noted within the
gallbladder, presumably related to prior ERCP performed on the
05/24/2013.  The persistence of this contrast within the
gallbladder at this time may suggest persistent obstruction of the
cystic duct despite stent placement.  The gallbladder wall does
appear mildly thickened measuring up to 5 mm.  No definite
pericholecystic fluid.  However, there is diffuse hypermetabolism
in the liver parenchyma around the gallbladder fossa (SUVmax =
8.5)which may suggest reactive inflammation.  The previously
described pancreatic mass appears to completely involve the
superior mesenteric vein and splenoportal confluence but is
separate from the superior mesenteric artery at this time.
Numerous mildly enlarged and hypermetabolic lymph nodes are noted
in the gastrohepatic ligament.  No definite focal hepatic lesions
are noted at this time.  The appearance of the spleen is
unremarkable, although there is a splenule in the splenic hilum.
Thickening of the left adrenal gland, with a focus of
hypermetabolism in the medial limb that is somewhat nodular in
appearance (SUVmax = 5.5)measuring approximately 1.4 cm in
diameter, concerning for potential adrenal metastasis.  The
appearance of the right kidney is unremarkable.  There is an
exophytic low attenuation lesion which appears to have thickened
walls, but is not hypermetabolic extending from the anterior aspect
of the interpolar region of the left kidney, favored to represent a
cyst.

Atherosclerosis throughout the abdominal and pelvic vasculature,
without definite aneurysm.  Numerous colonic diverticula are noted,
without surrounding inflammatory changes to suggest acute
diverticulitis at this time; the majority of diverticula are right-
sided.  In addition, in the ascending colon best demonstrated on
images 153 - 168 of series 2 there is some marked colonic wall
thickening that is somewhat mass-like in appearance and associated
with some hypermetabolism (SUVmax = 6.1), potentially concerning
for colonic neoplasm. Small calcified fibroid in the uterine
fundus.  Ovaries are unremarkable in appearance.  Urinary bladder
is unremarkable in appearance.

Skeleton:  No focal hypermetabolic activity to suggest skeletal
metastasis.
IMPRESSION: 1.  7.1 x 3.3 cm mass involving the head and body of the pancreas
with evidence of involvement of the superior mesenteric vein and
splenoportal confluence, as well as potential involvement of the
common bile duct, as above.  There is also some metastatic
lymphadenopathy in the gastrohepatic ligament.
2.  Large amount of residual contrast material within the
gallbladder in this patient with prior history of ERCP on
05/24/2013.  Given this finding, the gallbladder wall thickening,
and hypermetabolism surrounding the gallbladder within the
gallbladder fossa of the liver, some degree of obstruction of the
cystic duct is suspected.  Whether this is because of tumor or
inflammation is uncertain.  However, clinical correlation for signs
and symptoms of cholecystitis is recommended at this time, and
further evaluation with ultrasound may be warranted if clinically
indicated.
3.  1.4 cm hypermetabolic nodule in the medial limb of the left
adrenal gland concerning for a metastatic lesion.
4.  Mass-like thickening in the ascending colon with
hypermetabolism, as above.  Although there is a possibility that
this thickening may simply be related to chronic diverticulosis (no
current findings to suggest acute diverticulitis at this time), an
underlying colonic mass is suspected, and correlation with
colonoscopy is recommended when clinically feasible.
5.  Atherosclerosis, including left main and three-vessel coronary
artery disease.
6.  Additional findings, as above.

These results will be called to the ordering clinician or
representative by the Radiologist Assistant, and communication
documented in the PACS Dashboard.

## 2014-03-17 ENCOUNTER — Ambulatory Visit: Payer: Medicaid Other | Admitting: Oncology

## 2014-03-21 ENCOUNTER — Telehealth: Payer: Self-pay | Admitting: Oncology

## 2014-03-21 NOTE — Telephone Encounter (Signed)
Pt called and r/s appt to 5/22 [facility] 4/30

## 2014-03-24 ENCOUNTER — Encounter (HOSPITAL_COMMUNITY): Payer: Self-pay | Admitting: Emergency Medicine

## 2014-03-24 ENCOUNTER — Encounter: Payer: Self-pay | Admitting: *Deleted

## 2014-03-24 ENCOUNTER — Emergency Department (HOSPITAL_COMMUNITY)
Admission: EM | Admit: 2014-03-24 | Discharge: 2014-03-25 | Disposition: A | Discharge status: Home / Self Care | Payer: Medicaid Other | Attending: Emergency Medicine | Admitting: Emergency Medicine

## 2014-03-24 DIAGNOSIS — G8929 Other chronic pain: Secondary | ICD-10-CM | POA: Insufficient documentation

## 2014-03-24 DIAGNOSIS — C259 Malignant neoplasm of pancreas, unspecified: Secondary | ICD-10-CM

## 2014-03-24 DIAGNOSIS — F3289 Other specified depressive episodes: Secondary | ICD-10-CM | POA: Insufficient documentation

## 2014-03-24 DIAGNOSIS — E119 Type 2 diabetes mellitus without complications: Secondary | ICD-10-CM | POA: Insufficient documentation

## 2014-03-24 DIAGNOSIS — G893 Neoplasm related pain (acute) (chronic): Secondary | ICD-10-CM | POA: Insufficient documentation

## 2014-03-24 DIAGNOSIS — Z79899 Other long term (current) drug therapy: Secondary | ICD-10-CM | POA: Insufficient documentation

## 2014-03-24 DIAGNOSIS — Z7982 Long term (current) use of aspirin: Secondary | ICD-10-CM | POA: Insufficient documentation

## 2014-03-24 DIAGNOSIS — F329 Major depressive disorder, single episode, unspecified: Secondary | ICD-10-CM | POA: Insufficient documentation

## 2014-03-24 DIAGNOSIS — E785 Hyperlipidemia, unspecified: Secondary | ICD-10-CM | POA: Insufficient documentation

## 2014-03-24 DIAGNOSIS — F411 Generalized anxiety disorder: Secondary | ICD-10-CM | POA: Insufficient documentation

## 2014-03-24 DIAGNOSIS — K859 Acute pancreatitis without necrosis or infection, unspecified: Secondary | ICD-10-CM | POA: Insufficient documentation

## 2014-03-24 DIAGNOSIS — Z872 Personal history of diseases of the skin and subcutaneous tissue: Secondary | ICD-10-CM | POA: Insufficient documentation

## 2014-03-24 DIAGNOSIS — Z8673 Personal history of transient ischemic attack (TIA), and cerebral infarction without residual deficits: Secondary | ICD-10-CM | POA: Insufficient documentation

## 2014-03-24 DIAGNOSIS — Z794 Long term (current) use of insulin: Secondary | ICD-10-CM | POA: Insufficient documentation

## 2014-03-24 DIAGNOSIS — K219 Gastro-esophageal reflux disease without esophagitis: Secondary | ICD-10-CM | POA: Insufficient documentation

## 2014-03-24 DIAGNOSIS — Z87891 Personal history of nicotine dependence: Secondary | ICD-10-CM | POA: Insufficient documentation

## 2014-03-24 DIAGNOSIS — Z923 Personal history of irradiation: Secondary | ICD-10-CM | POA: Insufficient documentation

## 2014-03-24 DIAGNOSIS — Z862 Personal history of diseases of the blood and blood-forming organs and certain disorders involving the immune mechanism: Secondary | ICD-10-CM | POA: Insufficient documentation

## 2014-03-24 DIAGNOSIS — I10 Essential (primary) hypertension: Secondary | ICD-10-CM | POA: Insufficient documentation

## 2014-03-24 MED ORDER — HYDROMORPHONE HCL PF 1 MG/ML IJ SOLN
1.0000 mg | Freq: Once | INTRAMUSCULAR | Status: AC
Start: 2014-03-25 — End: 2014-03-25
  Administered 2014-03-25: 1 mg via INTRAVENOUS
  Filled 2014-03-24: qty 1

## 2014-03-24 MED ORDER — ONDANSETRON HCL 4 MG/2ML IJ SOLN
4.0000 mg | Freq: Once | INTRAMUSCULAR | Status: AC
  Administered 2014-03-25: 4 mg via INTRAVENOUS
  Filled 2014-03-24: qty 2

## 2014-03-24 NOTE — Progress Notes (Signed)
Received fax from Dr. Diona Browner, DMD requesting medical clearance for extraction of #18 teeth under general anesthesia. Per Dr. Benay Spice : She is not under active treatment and medical clearance should come from her PCP, Dr. Nolene Ebbs. Faxed this response back to office [phone]

## 2014-03-24 NOTE — ED Notes (Signed)
Pt presents with c/o abdominal pain. Pt says the pain is in her lower abdomen. Pt says she has been nauseated and has thrown up a couple of times. Pt also reports that she does not have a appetite. Pt has a hx of pancreatic cancer.

## 2014-03-24 NOTE — ED Notes (Signed)
Talked to family about pt's speech, speech sounds slurred at this time. Family reports that this is normal for pt when she gets sick.

## 2014-03-25 LAB — DIFFERENTIAL
Basophils Absolute: 0 10*3/uL (ref 0.0–0.1)
Basophils Relative: 0 % (ref 0–1)
EOS ABS: 0.2 10*3/uL (ref 0.0–0.7)
EOS PCT: 3 % (ref 0–5)
LYMPHS PCT: 12 % (ref 12–46)
Lymphs Abs: 0.7 10*3/uL (ref 0.7–4.0)
MONO ABS: 0.4 10*3/uL (ref 0.1–1.0)
MONOS PCT: 7 % (ref 3–12)
Neutro Abs: 4.5 10*3/uL (ref 1.7–7.7)
Neutrophils Relative %: 77 % (ref 43–77)

## 2014-03-25 LAB — URINALYSIS, ROUTINE W REFLEX MICROSCOPIC
HGB URINE DIPSTICK: NEGATIVE
Ketones, ur: NEGATIVE mg/dL
Leukocytes, UA: NEGATIVE
Nitrite: NEGATIVE
Protein, ur: 30 mg/dL — AB
SPECIFIC GRAVITY, URINE: 1.026 (ref 1.005–1.030)
UROBILINOGEN UA: 2 mg/dL — AB (ref 0.0–1.0)
pH: 6 (ref 5.0–8.0)

## 2014-03-25 LAB — COMPREHENSIVE METABOLIC PANEL
ALK PHOS: 738 U/L — AB (ref 39–117)
ALT: 95 U/L — ABNORMAL HIGH (ref 0–35)
AST: 89 U/L — AB (ref 0–37)
Albumin: 3.2 g/dL — ABNORMAL LOW (ref 3.5–5.2)
BUN: 20 mg/dL (ref 6–23)
CO2: 21 meq/L (ref 19–32)
Calcium: 9.5 mg/dL (ref 8.4–10.5)
Chloride: 101 mEq/L (ref 96–112)
Creatinine, Ser: 0.72 mg/dL (ref 0.50–1.10)
GFR calc non Af Amer: >90 mL/min (ref >90)
GLUCOSE: 130 mg/dL — AB (ref 70–99)
Potassium: 3.7 mEq/L (ref 3.7–5.3)
SODIUM: 138 meq/L (ref 137–147)
Total Bilirubin: 0.6 mg/dL (ref 0.3–1.2)
Total Protein: 8.4 g/dL — ABNORMAL HIGH (ref 6.0–8.3)

## 2014-03-25 LAB — CBC
HEMATOCRIT: 31.3 % — AB (ref 36.0–46.0)
HEMOGLOBIN: 10.1 g/dL — AB (ref 12.0–15.0)
MCH: 26.4 pg (ref 26.0–34.0)
MCHC: 32.3 g/dL (ref 30.0–36.0)
MCV: 81.7 fL (ref 78.0–100.0)
PLATELETS: 162 10*3/uL (ref 150–400)
RBC: 3.83 MIL/uL — AB (ref 3.87–5.11)
RDW: 16.6 % — ABNORMAL HIGH (ref 11.5–15.5)
WBC: 5.9 10*3/uL (ref 4.0–10.5)

## 2014-03-25 LAB — URINE MICROSCOPIC-ADD ON

## 2014-03-25 LAB — LIPASE, BLOOD: Lipase: 7 U/L — ABNORMAL LOW (ref 11–59)

## 2014-03-25 MED ORDER — SODIUM CHLORIDE 0.9 % IV BOLUS (SEPSIS)
1000.0000 mL | Freq: Once | INTRAVENOUS | Status: AC
  Administered 2014-03-25: 1000 mL via INTRAVENOUS

## 2014-03-25 MED ORDER — DOCUSATE SODIUM 100 MG PO CAPS: 100.0000 mg | ORAL_CAPSULE | Freq: Two times a day (BID) | ORAL | Status: DC | PRN

## 2014-03-25 MED ORDER — OXYCODONE HCL 10 MG PO TABS: 10.0000 mg | ORAL_TABLET | ORAL | Status: DC | PRN

## 2014-03-25 MED ORDER — ONDANSETRON 8 MG PO TBDP: 8.0000 mg | ORAL_TABLET | Freq: Three times a day (TID) | ORAL | Status: DC | PRN

## 2014-03-25 MED ORDER — POLYETHYLENE GLYCOL 3350 17 G PO PACK: 17.0000 g | PACK | Freq: Every day | ORAL | Status: DC | PRN

## 2014-03-25 NOTE — ED Provider Notes (Signed)
CSN: [id number]     Arrival date & time 03/24/14  2258 History   First MD Initiated Contact with Patient 03/24/14 2347     Chief Complaint  Patient presents with  . Abdominal Pain     (Consider location/radiation/quality/duration/timing/severity/associated sxs/prior Treatment) HPI 58 year old female presents to emergency department from home with complaint of persistent abdominal pain, loss of appetite, nausea.  Patient has pancreatic cancer.  She reports she is currently taking 5 mg of oxycodone twice a day with 800 mg of ibuprofen in between.  She reports pain has been ongoing for weeks.  Today, the pain did not let up since she decided to come to the ER.  Patient is followed by Dr. Benay Spice with oncology.  She has an appointment with him on the 22nd.  No fevers no chills.  No change in the nature or location of her pain.  Patient thinks that she is to start back on chemotherapy on her next oncology visit, but is not currently on a regimen. Past Medical History  Diagnosis Date  . DM (diabetes mellitus), type 2     requires insulin  . Hypertension   . Depression   . Anxiety   . Hyperlipidemia   . Pancreatitis   . GERD (gastroesophageal reflux disease)   . Pancreatic cancer 05/2013    adenocarcinoma on ERCP/FNA  . Hearing impaired     lost the last of her hearing aids and cn not get another one.  hearing is better via right ear.   Marland Kitchen History of radiation therapy 07/20/13-08/09/13    Pancreas 37.5Gy  . DDD (degenerative disc disease), lumbar   . Chronic back pain   . Stroke   . Anemia   . Ulcer 01/2014    radiation gastroduodenitis with duodenal ulcer.    Past Surgical History  Procedure Laterality Date  . Appendectomy    . Ercp N/A 05/24/2013    Procedure: ENDOSCOPIC RETROGRADE CHOLANGIOPANCREATOGRAPHY (ERCP);  Surgeon: Milus Banister, MD;  Location: [facility];  Service: Endoscopy;  Laterality: N/A;  MAC vs. general per anesthesia   . Tubal ligation    . Endoscopic retrograde  cholangiopancreatography (ercp) with propofol N/A 10/21/2013    Procedure: ENDOSCOPIC RETROGRADE CHOLANGIOPANCREATOGRAPHY (ERCP) WITH PROPOFOL;  Surgeon: Milus Banister, MD;  Location: [facility];  Service: Endoscopy;  Laterality: N/A;  Stent removal   . Biliary stent placement N/A 10/21/2013    Procedure: BILIARY STENT PLACEMENT;  Surgeon: Milus Banister, MD;  Location: [facility];  Service: Endoscopy;  Laterality: N/A;  . Tee without cardioversion N/A 12/29/2013    Procedure: TRANSESOPHAGEAL ECHOCARDIOGRAM (TEE);  Surgeon: Dorothy Spark, MD;  Location: [facility];  Service: Cardiovascular;  Laterality: N/A;  . Eus N/A 02/10/2014    Procedure: UPPER ENDOSCOPIC ULTRASOUND (EUS) LINEAR;  Surgeon: Milus Banister, MD;  Location: [facility];  Service: Endoscopy;  Laterality: N/A;  celiac plexus neurolysis  . Bile duct stent placement     Family History  Problem Relation Age of Onset  . Diabetes type II Mother   . Hypertension Mother   . Heart attack Father   . HIV/AIDS Brother    History  Substance Use Topics  . Smoking status: Former Smoker -- 0.50 packs/day for 42 years    Quit date: 05/28/2013  . Smokeless tobacco: Never Used  . Alcohol Use: No   OB History   Grav Para Term Preterm Abortions TAB SAB Ect Mult Living  Review of Systems   See History of Present Illness; otherwise all other systems are reviewed and negative  Allergies  Betadine  Home Medications   Prior to Admission medications   Medication Sig Start Date End Date Taking? Authorizing Provider  amLODipine (NORVASC) 10 MG tablet Take 1 tablet (10 mg total) by mouth daily. 01/06/14  Yes Donne Hazel, MD  aspirin 81 MG tablet Take 81 mg by mouth daily.   Yes Historical Provider, MD  cetirizine (ZYRTEC) 10 MG tablet Take 10 mg by mouth daily.   Yes Historical Provider, MD  gabapentin (NEURONTIN) 300 MG capsule Take 300 mg by mouth 3 (three) times daily.   Yes Historical Provider, MD   ibuprofen (ADVIL) 200 MG tablet Take 800 mg by mouth every 6 (six) hours as needed for mild pain or moderate pain.    Yes Historical Provider, MD  insulin aspart (NOVOLOG) 100 UNIT/ML injection Inject 10 Units into the skin 3 (three) times daily with meals.   Yes Historical Provider, MD  insulin glargine (LANTUS) 100 UNIT/ML injection Inject 30 Units into the skin at bedtime.    Yes Historical Provider, MD  lipase/protease/amylase (CREON-12/PANCREASE) 12000 UNITS CPEP capsule Take 1 capsule by mouth 3 (three) times daily before meals. 02/16/14  Yes Owens Shark, NP  lisinopril (PRINIVIL,ZESTRIL) 40 MG tablet Take 40 mg by mouth daily.   Yes Historical Provider, MD  metoCLOPramide (REGLAN) 5 MG tablet Take 5 mg by mouth 3 (three) times daily before meals.    Yes Historical Provider, MD  metoprolol tartrate (LOPRESSOR) 25 MG tablet Take 25 mg by mouth 2 (two) times daily.   Yes Historical Provider, MD  oxycodone (OXY-IR) 5 MG capsule Take 1 capsule (5 mg total) by mouth every 6 (six) hours as needed for pain. 03/09/14  Yes Verlee Monte, MD  pantoprazole (PROTONIX) 40 MG tablet Take 40 mg by mouth daily.   Yes Historical Provider, MD  potassium chloride (K-DUR,KLOR-CON) 10 MEQ tablet Take 10 mEq by mouth daily.   Yes Historical Provider, MD  simethicone (MYLICON) 433 MG chewable tablet Chew 125 mg by mouth every 6 (six) hours as needed for flatulence.   Yes Historical Provider, MD  simvastatin (ZOCOR) 10 MG tablet Take 10 mg by mouth at bedtime.   Yes Historical Provider, MD   BP 138/79  Pulse 74  Temp(Src) 98.6 F (37 C) (Oral)  Resp 18  SpO2 97% Physical Exam  Nursing note and vitals reviewed. Constitutional: She is oriented to person, place, and time. She appears well-developed and well-nourished. She appears distressed (uncomfortable appearing).  HENT:  Head: Normocephalic and atraumatic.  Right Ear: External ear normal.  Left Ear: External ear normal.  Nose: Nose normal.  Mouth/Throat:  Oropharynx is clear and moist.  Eyes: Conjunctivae and EOM are normal. Pupils are equal, round, and reactive to light.  Neck: Normal range of motion. Neck supple. No JVD present. No tracheal deviation present. No thyromegaly present.  Cardiovascular: Normal rate, regular rhythm, normal heart sounds and intact distal pulses.  Exam reveals no gallop and no friction rub.   No murmur heard. Pulmonary/Chest: Effort normal and breath sounds normal. No stridor. No respiratory distress. She has no wheezes. She has no rales. She exhibits no tenderness.  Abdominal: Soft. Bowel sounds are normal. She exhibits no distension and no mass. There is tenderness (diffuse tenderness worse around the umbilicus). There is no rebound and no guarding.  Musculoskeletal: Normal range of motion. She exhibits no edema and  no tenderness.  Lymphadenopathy:    She has no cervical adenopathy.  Neurological: She is alert and oriented to person, place, and time. She exhibits normal muscle tone. Coordination normal.  Skin: Skin is warm and dry. No rash noted. No erythema. No pallor.  Psychiatric: She has a normal mood and affect. Her behavior is normal. Judgment and thought content normal.    ED Course  Procedures (including critical care time) Labs Review Labs Reviewed  COMPREHENSIVE METABOLIC PANEL - Abnormal; Notable for the following:    Glucose, Bld 130 (*)    Total Protein 8.4 (*)    Albumin 3.2 (*)    AST 89 (*)    ALT 95 (*)    Alkaline Phosphatase 738 (*)    All other components within normal limits  LIPASE, BLOOD - Abnormal; Notable for the following:    Lipase 7 (*)    All other components within normal limits  CBC - Abnormal; Notable for the following:    RBC 3.83 (*)    Hemoglobin 10.1 (*)    HCT 31.3 (*)    RDW 16.6 (*)    All other components within normal limits  DIFFERENTIAL  CBC WITH DIFFERENTIAL  URINALYSIS, ROUTINE W REFLEX MICROSCOPIC    Imaging Review No results found.   EKG  Interpretation None      MDM   Final diagnoses:  Pancreatic cancer  Cancer related pain    58 year old female with pain most likely secondary to her pancreatic cancer.  Going over her medications, she is under dosed and can go up on her medications.  We'll check labs, see if we can get her pain under better control. 3:14 AM Patient feeling better, reports pain is much improved.  Await urine.  Given stable labs, expect patient will be able to go home with change in her pain medication regimen.  Kalman Drape, MD 03/25/14 [phone]

## 2014-03-25 NOTE — Discharge Instructions (Signed)
Your urine and blood work today was at your baseline.  Please take new medications as prescribed.  Follow up with Dr Benay Spice as arranged, and discuss with him your loss of appetite and pain control issues.     Pancreatic Cancer  Pancreatic cancer is an abnormal growth of tissue (tumor) in the pancreas that is cancerous (malignant). Unlike noncancerous (benign) tumors, malignant tumors can spread to other parts of your body. The pancreas is a gland located deep in the abdomen, between the stomach and the spine. The pancreas makes insulin and other hormones. These hormones help the body use or store the energy that comes from food. The pancreas also makes pancreatic juices. These juices contain enzymes that help digest food. Most pancreatic cancers begin in the ducts that carry pancreatic juices. When cancer of the pancreas spreads (metastasizes) outside the pancreas, cancer cells are often found in nearby lymph nodes. If the cancer has reached these nodes, it means that cancer cells may have spread to other lymph nodes or other tissues, such as the liver or lungs. Sometimes cancer of the pancreas spreads to the peritoneum. This is the tissue that lines the abdomen. CAUSES  The exact cause of pancreatic cancer is unknown.  RISK FACTORS There are a number of risk factors that can increase your chances of getting pancreatic cancer. They include:  Age. The likelihood of developing pancreatic cancer increases with age. Most pancreatic cancers occur in people older than 60 years.  Smoking. Cigarette smokers are 2 to 3 times more likely than nonsmokers to develop pancreatic cancer.  Diabetes, especially if you were diagnosed as an adult.  Being female.  Being African American.  Family history. The risk for developing pancreatic cancer triples if a person's mother, father, sister, or brother had the disease. Also, a family history of colon cancer or ovarian cancer increases the risk of pancreatic  cancer.  Chronic pancreatitis. Chronic pancreatitis is a painful condition of the pancreas.  Exposure to certain chemicals in the workplace.  Being obese or eating a diet high in fat and red meat. SYMPTOMS  Pancreatic cancer is sometimes called a "silent disease." This is because early pancreatic cancer often does not cause symptoms. As the cancer grows, symptoms may include:  Weakness.  Abdominal pain.  Diarrhea.  Depression.  Loss of appetite.  Indigestion.  Pain in the upper abdomen or upper back.  Nausea and vomiting.  Yellowing of the skin or eyes (jaundice).  Back pain.  Weight loss.  Fatigue.  Clay-colored stools.  Unexplained blood clots.  Dark urine. DIAGNOSIS  Your caregiver will ask about your medical history. He or she may also perform a number of procedures, such as:  A physical exam. Your skin and eyes will be examined for signs of jaundice. The abdomen will be checked for changes in the area near the pancreas, liver, and gallbladder. Your caregiver will also check for an abnormal buildup of fluid in the abdomen (ascites).  Lab tests. Your caregiver may take blood and urine samples to check for bilirubin and other substances. Bilirubin is a substance that passes from the liver to the intestine through the gallbladder and bile duct. If the bile duct is blocked by a tumor, the bilirubin cannot pass through normally. Blockage of the bile duct may cause the level of bilirubin in the blood and urine to become very high.  Computed tomography (CT). An X-ray machine linked to a computer takes a series of detailed pictures of the pancreas and other  organs and blood vessels in the abdomen.  Ultrasonography. The ultrasound device uses sound waves to produce pictures of the pancreas and other organs inside the abdomen.  Endoscopic retrograde cholangiopancreatography. A lighted tube (endoscope) is passed through the patient's mouth and stomach, down into the small  intestine. Then a smaller tube (catheter) is inserted through the endoscope into the bile ducts and pancreatic ducts. A dye is injected through the catheter into the ducts and X-rays are taken. The X-rays can show whether the ducts are narrowed or blocked by a tumor.  Endoscopic ultrasonography. An endoscope is passed through the patient's mouth and stomach, down into the small intestine. An ultrasound device is placed down the endoscope to produce pictures of the area, including the pancreas.  Percutaneous transhepatic cholangiography. A dye is injected through a thin needle into the liver and X-rays are taken. Unless there is a blockage, the dye should move freely through the bile ducts. From the X-rays, your caregiver can tell whether there is a blockage from a tumor.  Taking a tissue sample (biopsy) from the pancreas. The sample is examined under a microscope to look for cancer cells. Your cancer will be staged according to its severity and extent. Staging is a careful attempt to categorize your cancer to help determine which treatment will be most appropriate. Factors involved in staging include the size of the tumor, whether the cancer has spread, and if so, to what parts of the body it has spread. You may need to have more tests to determine the stage of your cancer. The test results will help determine what treatment plan is best for you. STAGES   Stage I. The cancer is only found in the pancreas.  Stage II. The cancer has spread to nearby tissues and possibly to the lymph nodes, but not to the blood vessels.  Stage III. The cancer is surrounding the major blood vessels beside the pancreas and has possibly spread to the lymph nodes.  Stage IV. The cancer has spread to other parts of the body, such as the liver, lungs, or peritoneum. TREATMENT  Treatment generally begins within several weeks after the diagnosis. There will be time to talk with your caregiver about treatment choices, get a  second opinion, and learn more about the disease. Your caregiver may refer you to a cancer specialist (oncologist).  Cancer of the pancreas is very hard to control with current treatments. For that reason, many caregivers encourage patients with this disease to consider taking part in a clinical trial. Clinical trials are an important option for people with all stages of pancreatic cancer.  At this time, pancreatic cancer can be cured only when it is found at an early stage, before it has spread. However, other treatments may be able to control the disease and help patients live longer and feel better. When a cure or control of the disease is not possible, some patients choose palliative therapy. Palliative therapy aims to improve quality of life by controlling pain and other problems caused by this disease, but it does not cure the disease. Depending on the type and stage, pancreatic cancer may be treated with surgery, radiation therapy, or chemotherapy. Some patients have a combination of these therapies.  Surgery may be done to remove all or part of the pancreas. Sometimes the cancer cannot be completely removed. However, if the tumor is blocking the common bile duct or duodenum, the surgeon can place a mesh tube (stent) in the blocked area. This helps keep  the duct or duodenum open.  Radiation therapy uses high-energy rays to kill cancer cells.  Chemotherapy is the use of drugs to kill cancer cells. Caregivers also give chemotherapy to help reduce pain and other problems caused by pancreatic cancer. HOME CARE INSTRUCTIONS   Only take over-the-counter or prescription medicines for pain, discomfort, or fever as directed by your caregiver.  Maintain a healthy diet.  Consider joining a support group. This may help you learn to cope with the stress of having pancreatic cancer.  Seek advice to help you manage treatment side effects.  Keep all follow-up appointments as directed by your  caregiver. SEEK IMMEDIATE MEDICAL CARE IF:   You have a sudden increase in pain.  Your skin or eyes turn more yellow.  You lose weight without trying.  You have a fever, especially during chemotherapy treatment or after stent placement.  You notice new fatigue or weakness. Document Released: 10/19/2004 Document Revised: 01/27/2012 Document Reviewed: 11/29/2011 [facility] Patient Information 2014 [place], [place].

## 2014-04-08 ENCOUNTER — Telehealth: Payer: Self-pay | Admitting: Oncology

## 2014-04-08 ENCOUNTER — Ambulatory Visit (HOSPITAL_BASED_OUTPATIENT_CLINIC_OR_DEPARTMENT_OTHER): Payer: Medicaid Other | Admitting: Oncology

## 2014-04-08 ENCOUNTER — Encounter: Payer: Self-pay | Admitting: *Deleted

## 2014-04-08 ENCOUNTER — Other Ambulatory Visit: Payer: Self-pay | Admitting: *Deleted

## 2014-04-08 VITALS — BP 150/69 | HR 70 | Temp 98.7°F | Resp 18 | Ht 66.0 in | Wt 137.5 lb

## 2014-04-08 DIAGNOSIS — R52 Pain, unspecified: Secondary | ICD-10-CM

## 2014-04-08 DIAGNOSIS — C250 Malignant neoplasm of head of pancreas: Secondary | ICD-10-CM

## 2014-04-08 MED ORDER — OXYCODONE HCL 10 MG PO TABS: 10.0000 mg | ORAL_TABLET | ORAL | Status: DC | PRN

## 2014-04-08 NOTE — Addendum Note (Signed)
Addended by: Domenic Schwab on: 04/08/2014 10:43 AM   Modules accepted: Orders

## 2014-04-08 NOTE — Telephone Encounter (Signed)
gv pt appt schedule for may/june °

## 2014-04-08 NOTE — Progress Notes (Signed)
[facility]  Clinical Social Work was referred by GI navigator to further assess for psychosocial needs. CSW met with patient and patient's daughter in [facility] lobby.   Ms. Ochsner is currently living with her daughter and daughter's children.  The patient states she has difficulty ambulating, is experiencing pain, and is unsure of what to do.  The patient showed CSW her medications she is currently taking- CSW expressed concern regarding patient's ability to remember to take all medications.  Ms. Feliz states she is taking medications, but shared she has noticed she is "more forgetful"- patient states, "I wonder if I have that alzheimers".  CSW and patient discussed how some symptoms may have worsened such as pain, mild memory loss, and difficulty walking.    CSW explored other concerns with patient- CSW asked patient, "did you get upset earlier during your visit today?"  Patient states she is "just overwhelmed with so many things".  She expressed feeling sad because she is unable to be as active as she was prior to illness.  Ms. Waddington shared she feels like a burden to her daughter, whom is currently caring for seven children.  Patient states, "I feel like I'm not living and I'm not dying, I'm just stuck".  CSW provided brief emotional counseling and encouraged patient to meet with CSW when she feels herself becoming overwhelmed or sad.  Patient was very appreciative of CSW visit and expressed relief in sharing how she felt.  CSW shared importance of identifying possible resources in community to provide support.  CSW will coordinate with GI coordinator to make referral to [facility] for home safety assessment, referral for walker/other DME needs, physical therapy evaluation, and community Education officer, museum.  CSW and GI team will also make referral for a Medicaid Personal Care services home aid, [employer] transportation, and [facility] services.   Clinical Social Work interventions: active listening,  brief solution-focused therapy, and referrals to community agencies to support patient through illness  Polo Riley, MSW, LCSW, OSW-C Clinical Social Worker [facility] [phone]

## 2014-04-08 NOTE — Telephone Encounter (Signed)
, °

## 2014-04-08 NOTE — Progress Notes (Signed)
[name] [name] OFFICE PROGRESS NOTE   Diagnosis: Pancreas cancer  INTERVAL HISTORY:   She returns after missing a scheduled appointment. She complains of persistent abdomen and back pain. She is now taking 10 mg of oxycodone for relief of the pain. Her appetite is poor. The bowels are functioning. [name] [name] plans on taking a trip to [place] next month for approximately 3 weeks. She will be leaving 04/22/2014.  She was admitted in April with increased abdominal pain and abnormal liver function studies. Imaging studies showed no acute findings. She reports a recent episode of hypoglycemia.  Objective:  Vital signs in last 24 hours:  Blood pressure 150/69, pulse 70, temperature 98.7 F (37.1 C), temperature source Oral, resp. rate 18, height 5\' 6"  (1.676 m), weight 137 lb 8 oz (62.37 kg), SpO2 100.00%.    HEENT: Neck without mass Resp: Lungs clear bilaterally Cardio: Regular rate and rhythm GI: No hepatomegaly, no mass, diffuse tenderness at the upper abdomen. Vascular: No leg edema   Lab Results:  Lab Results  Component Value Date   WBC 5.9 03/25/2014   HGB 10.1* 03/25/2014   HCT 31.3* 03/25/2014   MCV 81.7 03/25/2014   PLT 162 03/25/2014   NEUTROABS 4.5 03/25/2014    Imaging:  No results found.  Medications: I have reviewed the patient's current medications.  Assessment/Plan: 1. 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  Initiation of radiation and Xeloda 07/20/2013 with completion 08/09/2013.  CT scans abdomen/pelvis done 11/01/2013 for evaluation of increased abdominal pain showed stable infiltrating pancreatic mass. No progression identified. Stable peripancreatic lymph nodes. 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 No evidence of metastatic disease.  Pancreatic head mass stable on CT 01/03/2014 and  03/07/2014 2. Obstructive jaundice secondary to #1. Status post placement of a bile duct stent on 05/24/2013. Status post placement of a metal stent 10/21/2013. 3. Pain -likely secondary to pancreas cancer, no improvement following the celiac plexus nerve block 02/10/2014 4. Diabetes. 5. History of chronic pancreatitis. 6. History of alcohol use. 7. History of tobacco use. 8. Anxiety/depression. 9. Cutaneous nodule at the left flank. Question cyst, question metastasis. 10. Abnormal EKG, question Brugada syndrome. 11. Hospitalization 08/12/2013 through 08/15/2013 with nausea/vomiting and abdominal pain. Improved. 12. 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 Portal vein patent. New wall thickening of the mid to distal stomach and the hepatic flexure and proximal transverse colon. 13. 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 She discontinued Lovenox due to abdominal wall ecchymoses and hematomas. 14. Admission 12/22/2013 with strep viridans bacteremia status post 4 week course of ceftriaxone.  15. Status post celiac plexus nerve block 02/10/2014.  16. 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 She started Pancrease after an office visit 02/07/2014 17. Elevated liver enzymes    Disposition:  [name] [name] has persistent pain. The pain is likely secondary to pancreas cancer. She has been treated with Xeloda and radiation. We discussed systemic chemotherapy options. I recommend gemcitabine/Abraxane. I reviewed the specific toxicities associated with this regimen including the chance for nausea, alopecia, and hematologic toxicity. We discussed the allergic reaction, rash, fever, and pneumonitis associated with gemcitabine. We discussed  the neuropathy seen with Abraxane.  She agrees to proceed.  We discussed the rationale behind this treatment. The goal is to decrease her pain and potentially prolong survival. She appears to have difficulty understanding the treatment schedule and potential benefits. Her daughter understands. We will refer her for a chemotherapy teaching class.  She will be treated with a first cycle of gemcitabine/Abraxane on 04/15/2014 pending laboratory studies on 04/13/2014. [name] [name] will return for an office visit 04/20/2014.  The second treatment will be delayed secondary to her planned trip to [place]. She knows to contact us for a fever or bleeding. The first treatment will be with peripheral IV access. We plan for placement of a Port-A-Cath after she returns from [place].     Ladell Pier, MD  04/08/2014  10:10 AM

## 2014-04-09 ENCOUNTER — Encounter (HOSPITAL_COMMUNITY): Payer: Self-pay | Admitting: Emergency Medicine

## 2014-04-09 ENCOUNTER — Inpatient Hospital Stay (HOSPITAL_COMMUNITY)
Admission: EM | Admit: 2014-04-09 | Discharge: 2014-04-14 | DRG: 871 | Disposition: A | Discharge status: Home / Self Care | Payer: Medicaid Other | Attending: Internal Medicine | Admitting: Internal Medicine

## 2014-04-09 ENCOUNTER — Emergency Department (HOSPITAL_COMMUNITY): Payer: Medicaid Other

## 2014-04-09 DIAGNOSIS — IMO0002 Reserved for concepts with insufficient information to code with codable children: Secondary | ICD-10-CM

## 2014-04-09 DIAGNOSIS — R0789 Other chest pain: Secondary | ICD-10-CM

## 2014-04-09 DIAGNOSIS — N390 Urinary tract infection, site not specified: Secondary | ICD-10-CM | POA: Diagnosis present

## 2014-04-09 DIAGNOSIS — A419 Sepsis, unspecified organism: Secondary | ICD-10-CM | POA: Diagnosis present

## 2014-04-09 DIAGNOSIS — R6510 Systemic inflammatory response syndrome (SIRS) of non-infectious origin without acute organ dysfunction: Secondary | ICD-10-CM | POA: Diagnosis present

## 2014-04-09 DIAGNOSIS — K219 Gastro-esophageal reflux disease without esophagitis: Secondary | ICD-10-CM | POA: Diagnosis present

## 2014-04-09 DIAGNOSIS — C259 Malignant neoplasm of pancreas, unspecified: Secondary | ICD-10-CM

## 2014-04-09 DIAGNOSIS — E785 Hyperlipidemia, unspecified: Secondary | ICD-10-CM

## 2014-04-09 DIAGNOSIS — I81 Portal vein thrombosis: Secondary | ICD-10-CM | POA: Diagnosis present

## 2014-04-09 DIAGNOSIS — F3289 Other specified depressive episodes: Secondary | ICD-10-CM | POA: Diagnosis present

## 2014-04-09 DIAGNOSIS — M549 Dorsalgia, unspecified: Secondary | ICD-10-CM | POA: Diagnosis present

## 2014-04-09 DIAGNOSIS — I10 Essential (primary) hypertension: Secondary | ICD-10-CM

## 2014-04-09 DIAGNOSIS — C250 Malignant neoplasm of head of pancreas: Secondary | ICD-10-CM

## 2014-04-09 DIAGNOSIS — H9190 Unspecified hearing loss, unspecified ear: Secondary | ICD-10-CM | POA: Diagnosis present

## 2014-04-09 DIAGNOSIS — D638 Anemia in other chronic diseases classified elsewhere: Secondary | ICD-10-CM | POA: Diagnosis present

## 2014-04-09 DIAGNOSIS — K55069 Acute infarction of intestine, part and extent unspecified: Secondary | ICD-10-CM

## 2014-04-09 DIAGNOSIS — A4150 Gram-negative sepsis, unspecified: Principal | ICD-10-CM | POA: Diagnosis present

## 2014-04-09 DIAGNOSIS — B49 Unspecified mycosis: Secondary | ICD-10-CM | POA: Diagnosis present

## 2014-04-09 DIAGNOSIS — Z87891 Personal history of nicotine dependence: Secondary | ICD-10-CM

## 2014-04-09 DIAGNOSIS — D649 Anemia, unspecified: Secondary | ICD-10-CM | POA: Diagnosis present

## 2014-04-09 DIAGNOSIS — R0609 Other forms of dyspnea: Secondary | ICD-10-CM

## 2014-04-09 DIAGNOSIS — F329 Major depressive disorder, single episode, unspecified: Secondary | ICD-10-CM | POA: Diagnosis present

## 2014-04-09 DIAGNOSIS — G8929 Other chronic pain: Secondary | ICD-10-CM | POA: Diagnosis present

## 2014-04-09 DIAGNOSIS — K861 Other chronic pancreatitis: Secondary | ICD-10-CM | POA: Diagnosis present

## 2014-04-09 DIAGNOSIS — K831 Obstruction of bile duct: Secondary | ICD-10-CM

## 2014-04-09 DIAGNOSIS — Z8249 Family history of ischemic heart disease and other diseases of the circulatory system: Secondary | ICD-10-CM

## 2014-04-09 DIAGNOSIS — Z7982 Long term (current) use of aspirin: Secondary | ICD-10-CM

## 2014-04-09 DIAGNOSIS — Z888 Allergy status to other drugs, medicaments and biological substances status: Secondary | ICD-10-CM

## 2014-04-09 DIAGNOSIS — E119 Type 2 diabetes mellitus without complications: Secondary | ICD-10-CM

## 2014-04-09 DIAGNOSIS — Z923 Personal history of irradiation: Secondary | ICD-10-CM

## 2014-04-09 DIAGNOSIS — Z794 Long term (current) use of insulin: Secondary | ICD-10-CM

## 2014-04-09 DIAGNOSIS — R7881 Bacteremia: Secondary | ICD-10-CM

## 2014-04-09 DIAGNOSIS — R109 Unspecified abdominal pain: Secondary | ICD-10-CM

## 2014-04-09 DIAGNOSIS — F411 Generalized anxiety disorder: Secondary | ICD-10-CM | POA: Diagnosis present

## 2014-04-09 DIAGNOSIS — Z9221 Personal history of antineoplastic chemotherapy: Secondary | ICD-10-CM

## 2014-04-09 DIAGNOSIS — Z833 Family history of diabetes mellitus: Secondary | ICD-10-CM

## 2014-04-09 LAB — GLUCOSE, CAPILLARY
GLUCOSE-CAPILLARY: 112 mg/dL — AB (ref 70–99)
Glucose-Capillary: 166 mg/dL — ABNORMAL HIGH (ref 70–99)
Glucose-Capillary: 166 mg/dL — ABNORMAL HIGH (ref 70–99)

## 2014-04-09 LAB — COMPREHENSIVE METABOLIC PANEL
ALT: 153 U/L — ABNORMAL HIGH (ref 0–35)
AST: 313 U/L — ABNORMAL HIGH (ref 0–37)
Albumin: 3.4 g/dL — ABNORMAL LOW (ref 3.5–5.2)
Alkaline Phosphatase: 871 U/L — ABNORMAL HIGH (ref 39–117)
BUN: 19 mg/dL (ref 6–23)
CO2: 21 mEq/L (ref 19–32)
Calcium: 9.6 mg/dL (ref 8.4–10.5)
Chloride: 102 mEq/L (ref 96–112)
Creatinine, Ser: 0.69 mg/dL (ref 0.50–1.10)
GFR calc Af Amer: >90 mL/min (ref >90)
GFR calc non Af Amer: >90 mL/min (ref >90)
Glucose, Bld: 114 mg/dL — ABNORMAL HIGH (ref 70–99)
Potassium: 4.6 mEq/L (ref 3.7–5.3)
Sodium: 140 mEq/L (ref 137–147)
Total Bilirubin: 1 mg/dL (ref 0.3–1.2)
Total Protein: 8.8 g/dL — ABNORMAL HIGH (ref 6.0–8.3)

## 2014-04-09 LAB — URINALYSIS, ROUTINE W REFLEX MICROSCOPIC
Glucose, UA: 100 mg/dL — AB
Hgb urine dipstick: NEGATIVE
Ketones, ur: NEGATIVE mg/dL
Nitrite: NEGATIVE
Protein, ur: NEGATIVE mg/dL
Specific Gravity, Urine: 1.025 (ref 1.005–1.030)
Urobilinogen, UA: 4 mg/dL — ABNORMAL HIGH (ref 0.0–1.0)
pH: 6 (ref 5.0–8.0)

## 2014-04-09 LAB — URINE MICROSCOPIC-ADD ON

## 2014-04-09 LAB — CBC WITH DIFFERENTIAL/PLATELET
Basophils Absolute: 0 10*3/uL (ref 0.0–0.1)
Basophils Relative: 0 % (ref 0–1)
Eosinophils Absolute: 0.1 10*3/uL (ref 0.0–0.7)
Eosinophils Relative: 1 % (ref 0–5)
HCT: 22.7 % — ABNORMAL LOW (ref 36.0–46.0)
Hemoglobin: 7.4 g/dL — ABNORMAL LOW (ref 12.0–15.0)
Lymphocytes Relative: 3 % — ABNORMAL LOW (ref 12–46)
Lymphs Abs: 0.3 10*3/uL — ABNORMAL LOW (ref 0.7–4.0)
MCH: 26.7 pg (ref 26.0–34.0)
MCHC: 32.6 g/dL (ref 30.0–36.0)
MCV: 81.9 fL (ref 78.0–100.0)
Monocytes Absolute: 0.7 10*3/uL (ref 0.1–1.0)
Monocytes Relative: 6 % (ref 3–12)
Neutro Abs: 10 10*3/uL — ABNORMAL HIGH (ref 1.7–7.7)
Neutrophils Relative %: 90 % — ABNORMAL HIGH (ref 43–77)
Platelets: 251 10*3/uL (ref 150–400)
RBC: 2.77 MIL/uL — ABNORMAL LOW (ref 3.87–5.11)
RDW: 17 % — ABNORMAL HIGH (ref 11.5–15.5)
WBC: 11.1 10*3/uL — ABNORMAL HIGH (ref 4.0–10.5)

## 2014-04-09 LAB — TYPE AND SCREEN
ABO/RH(D): A POS
Antibody Screen: NEGATIVE

## 2014-04-09 LAB — ABO/RH: ABO/RH(D): A POS

## 2014-04-09 LAB — RETICULOCYTES
RBC.: 3.51 MIL/uL — AB (ref 3.87–5.11)
RETIC COUNT ABSOLUTE: 38.6 10*3/uL (ref 19.0–186.0)
RETIC CT PCT: 1.1 % (ref 0.4–3.1)

## 2014-04-09 LAB — CBG MONITORING, ED: Glucose-Capillary: 116 mg/dL — ABNORMAL HIGH (ref 70–99)

## 2014-04-09 LAB — LACTIC ACID, PLASMA: Lactic Acid, Venous: 2.9 mmol/L — ABNORMAL HIGH (ref 0.5–2.2)

## 2014-04-09 LAB — OCCULT BLOOD X 1 CARD TO LAB, STOOL: Fecal Occult Bld: POSITIVE — AB

## 2014-04-09 MED ORDER — METOCLOPRAMIDE HCL 5 MG PO TABS
5.0000 mg | ORAL_TABLET | Freq: Three times a day (TID) | ORAL | Status: DC
  Administered 2014-04-09 – 2014-04-14 (×16): 5 mg via ORAL
  Filled 2014-04-09 (×17): qty 1

## 2014-04-09 MED ORDER — PIPERACILLIN-TAZOBACTAM 3.375 G IVPB
3.3750 g | Freq: Three times a day (TID) | INTRAVENOUS | Status: DC
  Administered 2014-04-09 – 2014-04-12 (×8): 3.375 g via INTRAVENOUS
  Filled 2014-04-09 (×12): qty 50

## 2014-04-09 MED ORDER — OXYCODONE HCL 10 MG PO TABS: 10.0000 mg | ORAL_TABLET | ORAL | Status: DC | PRN

## 2014-04-09 MED ORDER — SIMVASTATIN 10 MG PO TABS
10.0000 mg | ORAL_TABLET | Freq: Every day | ORAL | Status: DC
  Administered 2014-04-09 – 2014-04-13 (×5): 10 mg via ORAL
  Filled 2014-04-09 (×6): qty 1

## 2014-04-09 MED ORDER — SODIUM CHLORIDE 0.9 % IV SOLN
INTRAVENOUS | Status: AC
  Administered 2014-04-09: 75 mL/h via INTRAVENOUS

## 2014-04-09 MED ORDER — ASPIRIN 81 MG PO TABS: 81.0000 mg | ORAL_TABLET | Freq: Every day | ORAL | Status: DC

## 2014-04-09 MED ORDER — ACETAMINOPHEN 650 MG RE SUPP: 650.0000 mg | Freq: Four times a day (QID) | RECTAL | Status: DC | PRN

## 2014-04-09 MED ORDER — SIMETHICONE 80 MG PO CHEW
80.0000 mg | CHEWABLE_TABLET | Freq: Four times a day (QID) | ORAL | Status: DC | PRN
  Filled 2014-04-09: qty 1

## 2014-04-09 MED ORDER — DOCUSATE SODIUM 100 MG PO CAPS: 100.0000 mg | ORAL_CAPSULE | Freq: Two times a day (BID) | ORAL | Status: DC | PRN

## 2014-04-09 MED ORDER — AMLODIPINE BESYLATE 10 MG PO TABS
10.0000 mg | ORAL_TABLET | Freq: Every day | ORAL | Status: DC
  Administered 2014-04-10 – 2014-04-14 (×5): 10 mg via ORAL
  Filled 2014-04-09 (×5): qty 1

## 2014-04-09 MED ORDER — LISINOPRIL 40 MG PO TABS
40.0000 mg | ORAL_TABLET | Freq: Every day | ORAL | Status: DC
  Administered 2014-04-10 – 2014-04-14 (×5): 40 mg via ORAL
  Filled 2014-04-09 (×5): qty 1

## 2014-04-09 MED ORDER — VANCOMYCIN HCL IN DEXTROSE 1-5 GM/200ML-% IV SOLN
1000.0000 mg | Freq: Once | INTRAVENOUS | Status: AC
  Administered 2014-04-09: 1000 mg via INTRAVENOUS
  Filled 2014-04-09: qty 200

## 2014-04-09 MED ORDER — PANTOPRAZOLE SODIUM 40 MG PO TBEC
40.0000 mg | DELAYED_RELEASE_TABLET | Freq: Every day | ORAL | Status: DC
  Administered 2014-04-09 – 2014-04-14 (×6): 40 mg via ORAL
  Filled 2014-04-09 (×6): qty 1

## 2014-04-09 MED ORDER — POLYETHYLENE GLYCOL 3350 17 G PO PACK
17.0000 g | PACK | Freq: Every day | ORAL | Status: DC | PRN
  Filled 2014-04-09: qty 1

## 2014-04-09 MED ORDER — MORPHINE SULFATE 4 MG/ML IJ SOLN
4.0000 mg | Freq: Once | INTRAMUSCULAR | Status: AC
  Administered 2014-04-09: 4 mg via INTRAVENOUS
  Filled 2014-04-09: qty 1

## 2014-04-09 MED ORDER — LORATADINE 10 MG PO TABS
10.0000 mg | ORAL_TABLET | Freq: Every day | ORAL | Status: DC
  Administered 2014-04-10 – 2014-04-14 (×5): 10 mg via ORAL
  Filled 2014-04-09 (×5): qty 1

## 2014-04-09 MED ORDER — INSULIN ASPART 100 UNIT/ML ~~LOC~~ SOLN
10.0000 [IU] | Freq: Three times a day (TID) | SUBCUTANEOUS | Status: DC
  Administered 2014-04-09 – 2014-04-10 (×2): 10 [IU] via SUBCUTANEOUS

## 2014-04-09 MED ORDER — ONDANSETRON 8 MG PO TBDP
8.0000 mg | ORAL_TABLET | Freq: Three times a day (TID) | ORAL | Status: DC | PRN
  Filled 2014-04-09: qty 1

## 2014-04-09 MED ORDER — SODIUM CHLORIDE 0.9 % IV BOLUS (SEPSIS)
1000.0000 mL | Freq: Once | INTRAVENOUS | Status: AC
  Administered 2014-04-09: 1000 mL via INTRAVENOUS

## 2014-04-09 MED ORDER — GABAPENTIN 300 MG PO CAPS
300.0000 mg | ORAL_CAPSULE | Freq: Three times a day (TID) | ORAL | Status: DC
  Administered 2014-04-09 – 2014-04-14 (×15): 300 mg via ORAL
  Filled 2014-04-09 (×16): qty 1

## 2014-04-09 MED ORDER — MORPHINE SULFATE 2 MG/ML IJ SOLN
2.0000 mg | INTRAMUSCULAR | Status: DC | PRN
  Administered 2014-04-12: 2 mg via INTRAVENOUS
  Filled 2014-04-09: qty 1

## 2014-04-09 MED ORDER — PANCRELIPASE (LIP-PROT-AMYL) 12000-38000 UNITS PO CPEP
1.0000 | ORAL_CAPSULE | Freq: Three times a day (TID) | ORAL | Status: DC
  Administered 2014-04-09 – 2014-04-14 (×16): 1 via ORAL
  Filled 2014-04-09 (×18): qty 1

## 2014-04-09 MED ORDER — OXYCODONE HCL 5 MG PO CAPS: 5.0000 mg | ORAL_CAPSULE | Freq: Four times a day (QID) | ORAL | Status: DC | PRN

## 2014-04-09 MED ORDER — VANCOMYCIN HCL IN DEXTROSE 750-5 MG/150ML-% IV SOLN
750.0000 mg | Freq: Two times a day (BID) | INTRAVENOUS | Status: DC
  Administered 2014-04-10: 750 mg via INTRAVENOUS
  Filled 2014-04-09 (×3): qty 150

## 2014-04-09 MED ORDER — ACETAMINOPHEN 325 MG PO TABS: 650.0000 mg | ORAL_TABLET | Freq: Four times a day (QID) | ORAL | Status: DC | PRN

## 2014-04-09 MED ORDER — ONDANSETRON HCL 4 MG/2ML IJ SOLN
4.0000 mg | Freq: Once | INTRAMUSCULAR | Status: AC
  Administered 2014-04-09: 4 mg via INTRAVENOUS
  Filled 2014-04-09: qty 2

## 2014-04-09 MED ORDER — PIPERACILLIN-TAZOBACTAM 3.375 G IVPB 30 MIN
3.3750 g | Freq: Once | INTRAVENOUS | Status: AC
  Administered 2014-04-09: 3.375 g via INTRAVENOUS
  Filled 2014-04-09: qty 50

## 2014-04-09 MED ORDER — ASPIRIN EC 81 MG PO TBEC
81.0000 mg | DELAYED_RELEASE_TABLET | Freq: Every day | ORAL | Status: DC
  Administered 2014-04-10 – 2014-04-14 (×5): 81 mg via ORAL
  Filled 2014-04-09 (×5): qty 1

## 2014-04-09 MED ORDER — OXYCODONE HCL 5 MG PO TABS
10.0000 mg | ORAL_TABLET | ORAL | Status: DC | PRN
  Administered 2014-04-10 – 2014-04-14 (×3): 10 mg via ORAL
  Filled 2014-04-09 (×3): qty 2

## 2014-04-09 MED ORDER — ACETAMINOPHEN 325 MG PO TABS
650.0000 mg | ORAL_TABLET | Freq: Once | ORAL | Status: AC
  Administered 2014-04-09: 650 mg via ORAL
  Filled 2014-04-09: qty 2

## 2014-04-09 MED ORDER — INSULIN ASPART 100 UNIT/ML ~~LOC~~ SOLN
0.0000 [IU] | Freq: Three times a day (TID) | SUBCUTANEOUS | Status: DC
  Administered 2014-04-09: 2 [IU] via SUBCUTANEOUS
  Administered 2014-04-11: 3 [IU] via SUBCUTANEOUS
  Administered 2014-04-11 – 2014-04-12 (×3): 5 [IU] via SUBCUTANEOUS
  Administered 2014-04-13 (×2): 3 [IU] via SUBCUTANEOUS
  Administered 2014-04-14: 5 [IU] via SUBCUTANEOUS
  Administered 2014-04-14: 3 [IU] via SUBCUTANEOUS

## 2014-04-09 MED ORDER — METOPROLOL TARTRATE 25 MG PO TABS
25.0000 mg | ORAL_TABLET | Freq: Two times a day (BID) | ORAL | Status: DC
  Administered 2014-04-09 – 2014-04-13 (×9): 25 mg via ORAL
  Filled 2014-04-09 (×11): qty 1

## 2014-04-09 MED ORDER — INSULIN GLARGINE 100 UNIT/ML ~~LOC~~ SOLN
30.0000 [IU] | Freq: Every day | SUBCUTANEOUS | Status: DC
  Administered 2014-04-09: 30 [IU] via SUBCUTANEOUS
  Filled 2014-04-09 (×2): qty 0.3

## 2014-04-09 MED ORDER — POTASSIUM CHLORIDE CRYS ER 10 MEQ PO TBCR
10.0000 meq | EXTENDED_RELEASE_TABLET | Freq: Every day | ORAL | Status: DC
  Administered 2014-04-10 – 2014-04-14 (×5): 10 meq via ORAL
  Filled 2014-04-09 (×5): qty 1

## 2014-04-09 NOTE — Progress Notes (Signed)
Pharmacy Re: Zosyn  Zosyn 3.375 grams IV given over 30 minutes in ED at ~3:30pm.  To continue, along with Vancomycin. Good renal function.  Plan:  - Zosyn 3.375 grams IV q8hrs (each infused over 4 hours).  Consuello Masse, RPh Pager: [phone] 04/09/2014 6:42 PM

## 2014-04-09 NOTE — ED Provider Notes (Signed)
CSN: [id number]     Arrival date & time 04/09/14  1338 History   First MD Initiated Contact with Patient 04/09/14 1354     Chief Complaint  Patient presents with  . Shaking  . Chills     (Consider location/radiation/quality/duration/timing/severity/associated sxs/prior Treatment) HPI  58yF with rigors. Past history significant for adenocarcinoma of the pancreas s/p chemo/radiation 07/2013. Not surgical candidate. Plan palliative chemo to start later this month. Had admission 12/2013 with strep viridans bacteremia status post 4 week course of ceftriaxone.   States she began feeling febrile and then shaking chills. Very fatigued. This started about 2 hours before arrival. She woke up this morning more or less in her usual state of health.  I having some upper abdominal pain, but is more chronic in nature. She denies any acute pain anywhere. No nausea or vomiting. No diarrhea. No urinary complaints. No confusion per her daughter. No cough. No shortness of breath.   Past Medical History  Diagnosis Date  . DM (diabetes mellitus), type 2     requires insulin  . Hypertension   . Depression   . Anxiety   . Hyperlipidemia   . Pancreatitis   . GERD (gastroesophageal reflux disease)   . Pancreatic cancer 05/2013    adenocarcinoma on ERCP/FNA  . Hearing impaired     lost the last of her hearing aids and cn not get another one.  hearing is better via right ear.   Marland Kitchen History of radiation therapy 07/20/13-08/09/13    Pancreas 37.5Gy  . DDD (degenerative disc disease), lumbar   . Chronic back pain   . Stroke   . Anemia   . Ulcer 01/2014    radiation gastroduodenitis with duodenal ulcer.    Past Surgical History  Procedure Laterality Date  . Appendectomy    . Ercp N/A 05/24/2013    Procedure: ENDOSCOPIC RETROGRADE CHOLANGIOPANCREATOGRAPHY (ERCP);  Surgeon: Milus Banister, MD;  Location: [facility];  Service: Endoscopy;  Laterality: N/A;  MAC vs. general per anesthesia   . Tubal ligation    .  Endoscopic retrograde cholangiopancreatography (ercp) with propofol N/A 10/21/2013    Procedure: ENDOSCOPIC RETROGRADE CHOLANGIOPANCREATOGRAPHY (ERCP) WITH PROPOFOL;  Surgeon: Milus Banister, MD;  Location: [facility];  Service: Endoscopy;  Laterality: N/A;  Stent removal   . Biliary stent placement N/A 10/21/2013    Procedure: BILIARY STENT PLACEMENT;  Surgeon: Milus Banister, MD;  Location: [facility];  Service: Endoscopy;  Laterality: N/A;  . Tee without cardioversion N/A 12/29/2013    Procedure: TRANSESOPHAGEAL ECHOCARDIOGRAM (TEE);  Surgeon: Dorothy Spark, MD;  Location: [facility];  Service: Cardiovascular;  Laterality: N/A;  . Eus N/A 02/10/2014    Procedure: UPPER ENDOSCOPIC ULTRASOUND (EUS) LINEAR;  Surgeon: Milus Banister, MD;  Location: [facility];  Service: Endoscopy;  Laterality: N/A;  celiac plexus neurolysis  . Bile duct stent placement     Family History  Problem Relation Age of Onset  . Diabetes type II Mother   . Hypertension Mother   . Heart attack Father   . HIV/AIDS Brother    History  Substance Use Topics  . Smoking status: Former Smoker -- 0.50 packs/day for 42 years    Quit date: 05/28/2013  . Smokeless tobacco: Never Used  . Alcohol Use: No   OB History   Grav Para Term Preterm Abortions TAB SAB Ect Mult Living                 Review of Systems  All systems reviewed and negative, other than as noted in HPI.   Allergies  Betadine  Home Medications   Prior to Admission medications   Medication Sig Start Date End Date Taking? Authorizing Provider  amLODipine (NORVASC) 10 MG tablet Take 1 tablet (10 mg total) by mouth daily. 01/06/14  Yes Donne Hazel, MD  aspirin 81 MG tablet Take 81 mg by mouth daily.   Yes Historical Provider, MD  cetirizine (ZYRTEC) 10 MG tablet Take 10 mg by mouth daily.   Yes Historical Provider, MD  docusate sodium (COLACE) 100 MG capsule Take 1 capsule (100 mg total) by mouth 2 (two) times daily as needed for mild  constipation. 03/25/14  Yes Kalman Drape, MD  gabapentin (NEURONTIN) 300 MG capsule Take 300 mg by mouth 3 (three) times daily.   Yes Historical Provider, MD  ibuprofen (ADVIL) 200 MG tablet Take 800 mg by mouth every 6 (six) hours as needed for mild pain or moderate pain.    Yes Historical Provider, MD  insulin aspart (NOVOLOG) 100 UNIT/ML injection Inject 10 Units into the skin 3 (three) times daily with meals.   Yes Historical Provider, MD  insulin glargine (LANTUS) 100 UNIT/ML injection Inject 30 Units into the skin at bedtime.    Yes Historical Provider, MD  lipase/protease/amylase (CREON-12/PANCREASE) 12000 UNITS CPEP capsule Take 1 capsule by mouth 3 (three) times daily before meals. 02/16/14  Yes Owens Shark, NP  lisinopril (PRINIVIL,ZESTRIL) 40 MG tablet Take 40 mg by mouth daily.   Yes Historical Provider, MD  metoCLOPramide (REGLAN) 5 MG tablet Take 5 mg by mouth 3 (three) times daily before meals.    Yes Historical Provider, MD  metoprolol tartrate (LOPRESSOR) 25 MG tablet Take 25 mg by mouth 2 (two) times daily.   Yes Historical Provider, MD  ondansetron (ZOFRAN ODT) 8 MG disintegrating tablet Take 1 tablet (8 mg total) by mouth every 8 (eight) hours as needed for nausea or vomiting. 03/25/14  Yes Kalman Drape, MD  oxycodone (OXY-IR) 5 MG capsule Take 1 capsule (5 mg total) by mouth every 6 (six) hours as needed for pain. 03/09/14  Yes Verlee Monte, MD  Oxycodone HCl 10 MG TABS Take 1 tablet (10 mg total) by mouth every 4 (four) hours as needed. 04/08/14  Yes Ladell Pier, MD  pantoprazole (PROTONIX) 40 MG tablet Take 40 mg by mouth daily.   Yes Historical Provider, MD  polyethylene glycol (MIRALAX / GLYCOLAX) packet Take 17 g by mouth daily as needed for mild constipation or moderate constipation. 03/25/14  Yes Kalman Drape, MD  potassium chloride (K-DUR,KLOR-CON) 10 MEQ tablet Take 10 mEq by mouth daily.   Yes Historical Provider, MD  simethicone (MYLICON) 244 MG chewable tablet Chew 125 mg  by mouth every 6 (six) hours as needed for flatulence.   Yes Historical Provider, MD  simvastatin (ZOCOR) 10 MG tablet Take 10 mg by mouth at bedtime.   Yes Historical Provider, MD   BP 153/77  Pulse 84  Temp(Src) 103.4 F (39.7 C) (Rectal)  Resp 20  SpO2 98% Physical Exam  Nursing note and vitals reviewed. Constitutional: She appears well-developed and well-nourished. No distress.  HENT:  Head: Normocephalic and atraumatic.  Eyes: Conjunctivae are normal. Right eye exhibits no discharge. Left eye exhibits no discharge.  Neck: Neck supple.  Cardiovascular: Normal rate, regular rhythm and normal heart sounds.  Exam reveals no gallop and no friction rub.   No murmur heard. Pulmonary/Chest: Effort normal  and breath sounds normal. No respiratory distress.  Abdominal: Soft. She exhibits no distension. There is tenderness.  Mild to moderate predominant tenderness without rebound or guarding  Musculoskeletal: She exhibits no edema and no tenderness.  Neurological:  Drowsy. Awakens to conversational voice. Speech is clear. Answering questions appropriately. Following commands.  Skin: Skin is warm and dry.  Psychiatric: Her behavior is normal. Thought content normal.    ED Course  Procedures (including critical care time) Labs Review Labs Reviewed  CBC WITH DIFFERENTIAL - Abnormal; Notable for the following:    WBC 11.1 (*)    RBC 2.77 (*)    Hemoglobin 7.4 (*)    HCT 22.7 (*)    RDW 17.0 (*)    Neutrophils Relative % 90 (*)    Neutro Abs 10.0 (*)    Lymphocytes Relative 3 (*)    Lymphs Abs 0.3 (*)    All other components within normal limits  COMPREHENSIVE METABOLIC PANEL - Abnormal; Notable for the following:    Glucose, Bld 114 (*)    Total Protein 8.8 (*)    Albumin 3.4 (*)    AST 313 (*)    ALT 153 (*)    Alkaline Phosphatase 871 (*)    All other components within normal limits  URINALYSIS, ROUTINE W REFLEX MICROSCOPIC - Abnormal; Notable for the following:    Color,  Urine AMBER (*)    Glucose, UA 100 (*)    Bilirubin Urine SMALL (*)    Urobilinogen, UA 4.0 (*)    Leukocytes, UA TRACE (*)    All other components within normal limits  LACTIC ACID, PLASMA - Abnormal; Notable for the following:    Lactic Acid, Venous 2.9 (*)    All other components within normal limits  URINE MICROSCOPIC-ADD ON - Abnormal; Notable for the following:    Squamous Epithelial / LPF MANY (*)    Bacteria, UA FEW (*)    All other components within normal limits  CBG MONITORING, ED - Abnormal; Notable for the following:    Glucose-Capillary 116 (*)    All other components within normal limits  CULTURE, BLOOD (ROUTINE X 2)  CULTURE, BLOOD (ROUTINE X 2)  OCCULT BLOOD X 1 CARD TO LAB, STOOL  TYPE AND SCREEN    Imaging Review No results found.  Dg Chest Portable 1 View  04/09/2014   CLINICAL DATA:  Fever, pancreatitis, pancreatic cancer  EXAM: PORTABLE CHEST - 1 VIEW  COMPARISON:  12/31/2013  FINDINGS: Cardiomediastinal silhouette is stable. No acute infiltrate or pleural effusion. No pulmonary edema. Mild degenerative changes thoracic spine.  IMPRESSION: No active disease.   Electronically Signed   By: Lahoma Crocker M.D.   On: 04/09/2014 15:52    EKG Interpretation None      MDM   Final diagnoses:  SIRS (systemic inflammatory response syndrome)  Pancreatic cancer        Virgel Manifold, MD 04/13/14 [phone]

## 2014-04-09 NOTE — Progress Notes (Signed)
Report received from [facility] in ED for patient to be admitted into 5w17.

## 2014-04-09 NOTE — ED Notes (Signed)
Pt reports onset today of shaking all over and chills. Temp 100.1 at triage.

## 2014-04-09 NOTE — H&P (Signed)
[facility] History and Physical  [name] [name] [id number] DOB: 07-14-1956 DOA: 04/09/2014  Referring physician:  PCP: Philis Fendt, MD  Specialists:   Chief Complaint: Fever and chills  HPI: [name] [name] is a 58 y.o. 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 Patient's daughter is also did provide some of the history. Patient started having fever and chills approximately 2 hours before arrival to emergency department. She is also complaining of feeling very weak. Patient woke up this morning feeling somewhat tired, however states she was per usual self. She denies any nausea, vomiting, diarrhea, sick contacts, recent travel. Her for her, patient recently had an episode in February of this year, for which she was found to have a "blood infection and was treated with antibiotics."  Review of Systems:  Constitutional: Admits to fever, chills, poor appetite.  HEENT: Denies photophobia, eye pain, redness, hearing loss, ear pain, congestion, sore throat, rhinorrhea, sneezing, mouth sores, trouble swallowing, neck pain, neck stiffness and tinnitus.   Respiratory: Denies SOB, DOE, cough, chest tightness,  and wheezing.   Cardiovascular: Denies chest pain, palpitations and leg swelling.  Gastrointestinal: Has chronic abdominal pain. Denies any nausea or vomiting. Denies any dark stool or bright red blood per. Genitourinary: Denies dysuria, urgency, frequency, hematuria, flank pain and difficulty urinating.  Musculoskeletal: Complains of chronic back pain. Skin: Denies pallor, rash and wound.  Neurological: Denies dizziness, seizures, syncope, weakness, light-headedness, numbness and headaches.  Hematological: Denies adenopathy. Easy bruising, personal or family bleeding history  Psychiatric/Behavioral: Denies suicidal ideation, mood changes,  confusion, nervousness, sleep disturbance and agitation  Past Medical History  Diagnosis Date  . DM (diabetes mellitus), type 2     requires insulin  . Hypertension   . Depression   . Anxiety   . Hyperlipidemia   . Pancreatitis   . GERD (gastroesophageal reflux disease)   . Pancreatic cancer 05/2013    adenocarcinoma on ERCP/FNA  . Hearing impaired     lost the last of her hearing aids and cn not get another one.  hearing is better via right ear.   Marland Kitchen History of radiation therapy 07/20/13-08/09/13    Pancreas 37.5Gy  . DDD (degenerative disc disease), lumbar   . Chronic back pain   . Stroke   . Anemia   . Ulcer 01/2014    radiation gastroduodenitis with duodenal ulcer.    Past Surgical History  Procedure Laterality Date  . Appendectomy    . Ercp N/A 05/24/2013    Procedure: ENDOSCOPIC RETROGRADE CHOLANGIOPANCREATOGRAPHY (ERCP);  Surgeon: Milus Banister, MD;  Location: [facility];  Service: Endoscopy;  Laterality: N/A;  MAC vs. general per anesthesia   . Tubal ligation    . Endoscopic retrograde cholangiopancreatography (ercp) with propofol N/A 10/21/2013    Procedure: ENDOSCOPIC RETROGRADE CHOLANGIOPANCREATOGRAPHY (ERCP) WITH PROPOFOL;  Surgeon: Milus Banister, MD;  Location: [facility];  Service: Endoscopy;  Laterality: N/A;  Stent removal   . Biliary stent placement N/A 10/21/2013    Procedure: BILIARY STENT PLACEMENT;  Surgeon: Milus Banister, MD;  [facility];  Service: Endoscopy;  Laterality: N/A;  . Tee without cardioversion N/A 12/29/2013    Procedure: TRANSESOPHAGEAL ECHOCARDIOGRAM (TEE);  Surgeon: Dorothy Spark, MD;  Location: [facility];  Service: Cardiovascular;  Laterality: N/A;  . Eus N/A 02/10/2014    Procedure: UPPER ENDOSCOPIC ULTRASOUND (EUS) LINEAR;  Surgeon: Milus Banister,  MD;  [facility];  Service: Endoscopy;  Laterality: N/A;  celiac plexus neurolysis  . Bile duct stent placement     Social History:  reports that she quit smoking about  10 months ago. She has never used smokeless tobacco. She reports that she does not drink alcohol or use illicit drugs. Patient was not home, her daughter lives with her.  Allergies  Allergen Reactions  . Betadine [Povidone Iodine] Itching and Rash    Family History  Problem Relation Age of Onset  . Diabetes type II Mother   . Hypertension Mother   . Heart attack Father   . HIV/AIDS Brother     Prior to Admission medications   Medication Sig Start Date End Date Taking? Authorizing Provider  amLODipine (NORVASC) 10 MG tablet Take 1 tablet (10 mg total) by mouth daily. 01/06/14  Yes Donne Hazel, MD  aspirin 81 MG tablet Take 81 mg by mouth daily.   Yes Historical Provider, MD  cetirizine (ZYRTEC) 10 MG tablet Take 10 mg by mouth daily.   Yes Historical Provider, MD  docusate sodium (COLACE) 100 MG capsule Take 1 capsule (100 mg total) by mouth 2 (two) times daily as needed for mild constipation. 03/25/14  Yes Kalman Drape, MD  gabapentin (NEURONTIN) 300 MG capsule Take 300 mg by mouth 3 (three) times daily.   Yes Historical Provider, MD  ibuprofen (ADVIL) 200 MG tablet Take 800 mg by mouth every 6 (six) hours as needed for mild pain or moderate pain.    Yes Historical Provider, MD  insulin aspart (NOVOLOG) 100 UNIT/ML injection Inject 10 Units into the skin 3 (three) times daily with meals.   Yes Historical Provider, MD  insulin glargine (LANTUS) 100 UNIT/ML injection Inject 30 Units into the skin at bedtime.    Yes Historical Provider, MD  lipase/protease/amylase (CREON-12/PANCREASE) 12000 UNITS CPEP capsule Take 1 capsule by mouth 3 (three) times daily before meals. 02/16/14  Yes Owens Shark, NP  lisinopril (PRINIVIL,ZESTRIL) 40 MG tablet Take 40 mg by mouth daily.   Yes Historical Provider, MD  metoCLOPramide (REGLAN) 5 MG tablet Take 5 mg by mouth 3 (three) times daily before meals.    Yes Historical Provider, MD  metoprolol tartrate (LOPRESSOR) 25 MG tablet Take 25 mg by mouth 2 (two)  times daily.   Yes Historical Provider, MD  ondansetron (ZOFRAN ODT) 8 MG disintegrating tablet Take 1 tablet (8 mg total) by mouth every 8 (eight) hours as needed for nausea or vomiting. 03/25/14  Yes Kalman Drape, MD  oxycodone (OXY-IR) 5 MG capsule Take 1 capsule (5 mg total) by mouth every 6 (six) hours as needed for pain. 03/09/14  Yes Verlee Monte, MD  Oxycodone HCl 10 MG TABS Take 1 tablet (10 mg total) by mouth every 4 (four) hours as needed. 04/08/14  Yes Ladell Pier, MD  pantoprazole (PROTONIX) 40 MG tablet Take 40 mg by mouth daily.   Yes Historical Provider, MD  polyethylene glycol (MIRALAX / GLYCOLAX) packet Take 17 g by mouth daily as needed for mild constipation or moderate constipation. 03/25/14  Yes Kalman Drape, MD  potassium chloride (K-DUR,KLOR-CON) 10 MEQ tablet Take 10 mEq by mouth daily.   Yes Historical Provider, MD  simethicone (MYLICON) 170 MG chewable tablet Chew 125 mg by mouth every 6 (six) hours as needed for flatulence.   Yes Historical Provider, MD  simvastatin (ZOCOR) 10 MG tablet Take 10 mg by mouth at bedtime.  Yes Historical Provider, MD   Physical Exam: Filed Vitals:   04/09/14 1520  BP: 166/87  Pulse: 88  Temp:   Resp:    General: Well developed, well nourished, NAD, appears stated age  HEENT: NCAT, PERRLA, EOMI, Anicteic Sclera, mucous membranes moist.  Neck: Supple, no JVD, no masses  Cardiovascular: S1 S2 auscultated, no rubs, murmurs or gallops. Regular rate and rhythm.  Respiratory: Clear to auscultation bilaterally with equal chest rise  Abdomen: Soft, diffusely tender, nondistended, + bowel sounds  Extremities: warm dry without cyanosis clubbing or edema  Neuro: AAOx3, cranial nerves grossly intact. Right upper extremity slightly weaker on the left  Skin: Without rashes exudates or nodules  Psych: Agitated, however cooperative, intact insight and judgment  Labs on Admission:  Basic Metabolic Panel:  Recent Labs Lab 04/09/14 1456  NA 140    K 4.6  CL 102  CO2 21  GLUCOSE 114*  BUN 19  CREATININE 0.69  CALCIUM 9.6   Liver Function Tests:  Recent Labs Lab 04/09/14 1456  AST 313*  ALT 153*  ALKPHOS 871*  BILITOT 1.0  PROT 8.8*  ALBUMIN 3.4*   No results found for this basename: LIPASE, AMYLASE,  in the last 168 hours No results found for this basename: AMMONIA,  in the last 168 hours CBC:  Recent Labs Lab 04/09/14 1456  WBC 11.1*  NEUTROABS 10.0*  HGB 7.4*  HCT 22.7*  MCV 81.9  PLT 251   Cardiac Enzymes: No results found for this basename: CKTOTAL, CKMB, CKMBINDEX, TROPONINI,  in the last 168 hours  BNP (last 3 results) No results found for this basename: PROBNP,  in the last 8760 hours CBG:  Recent Labs Lab 04/09/14 1402  GLUCAP 116*    Radiological Exams on Admission: Dg Chest Portable 1 View  04/09/2014   CLINICAL DATA:  Fever, pancreatitis, pancreatic cancer  EXAM: PORTABLE CHEST - 1 VIEW  COMPARISON:  12/31/2013  FINDINGS: Cardiomediastinal silhouette is stable. No acute infiltrate or pleural effusion. No pulmonary edema. Mild degenerative changes thoracic spine.  IMPRESSION: No active disease.   Electronically Signed   By: Lahoma Crocker M.D.   On: 04/09/2014 15:52    EKG: None  Assessment/Plan  SIRS with rigors -Patient will be admitted to medical floor -Currently febrile and has leukocytosis with unknown source -CXR: No active disease -UA: Few bacteria, 3-6 white blood cells, many squamous epithelial cells, trace leukocytes -Blood cultures pending -Obtain a repeat UA and culture -Patient did have a very dense in February 2015, for which she was treated with ceftriaxone for 2 weeks via PICC line. Patient did have TEE in February 2014 did not show any vegetations. -Will continue broad-spectrum antibiotics with vancomycin and Zosyn and will place patient on IV fluids.  Normocytic anemia -Patient's baseline hemoglobin appears to be approximately 8.5, currently 7.4 -Pending fecal  occult -Will obtain anemia panel -Patient does not seem to be overtly bleeding, no melanotic stool or bright red blood per rectum. -Currently hemodynamically stable  Diabetes mellitus -Continue home insulin regimen of Lantus and NovoLog as well as place patient on insulin sliding scale with CBG monitoring  Hypertension -Continue amlodipine, metoprolol, lisinopril  Pancreatic cancer -Diagnosed in July 2014, patient also Dr. Benay Spice as an outpatient -Patient underwent chemotherapy in September 2014  Chronic Abdominal pain with elevation of LFTs -Patient had ERCP in December of 2014 with metal stent placed -Continue Protonix and Carafate  GERD -Continue Protonix  Hyperlipidemia -Continue simvastatin  DVT prophylaxis: SCDs  Code Status: Full  Condition: Guarded  Family Communication: Family at bedside. Admission, patients condition and plan of care including tests being ordered have been discussed with the patient and family who indicate understanding and agree with the plan and Code Status.  Disposition Plan: Admitted  Time spent: 60 minutes  [place] D.O. [facility] Pager [phone]  If 7PM-7AM, please contact night-coverage [email or web address] Password [facility] 04/09/2014, 4:44 PM

## 2014-04-09 NOTE — ED Notes (Signed)
Hospitalist, MD at bedside. 

## 2014-04-09 NOTE — ED Notes (Signed)
Flow manager called and room assignment is changing; patient to stay in ED at this time.

## 2014-04-09 NOTE — Progress Notes (Signed)
ANTIBIOTIC CONSULT NOTE - INITIAL  Pharmacy Consult for vancomycin Indication: rule out sepsis  Allergies  Allergen Reactions  . Betadine [Povidone Iodine] Itching and Rash    Patient Measurements:     Vital Signs: Temp: 101.3 F (38.5 C) (05/23 1727) Temp src: Oral (05/23 1727) BP: 134/67 mmHg (05/23 1700) Pulse Rate: 95 (05/23 1700) Intake/Output from previous day:   Intake/Output from this shift:    Labs:  Recent Labs  04/09/14 1456  WBC 11.1*  HGB 7.4*  PLT 251  CREATININE 0.69   The CrCl is unknown because both a height and weight (above a minimum accepted value) are required for this calculation. No results found for this basename: VANCOTROUGH, VANCOPEAK, VANCORANDOM, GENTTROUGH, GENTPEAK, GENTRANDOM, TOBRATROUGH, TOBRAPEAK, TOBRARND, AMIKACINPEAK, AMIKACINTROU, AMIKACIN,  in the last 72 hours   Microbiology: No results found for this or any previous visit (from the past 720 hour(s)).  Medical History: Past Medical History  Diagnosis Date  . DM (diabetes mellitus), type 2     requires insulin  . Hypertension   . Depression   . Anxiety   . Hyperlipidemia   . Pancreatitis   . GERD (gastroesophageal reflux disease)   . Pancreatic cancer 05/2013    adenocarcinoma on ERCP/FNA  . Hearing impaired     lost the last of her hearing aids and cn not get another one.  hearing is better via right ear.   Marland Kitchen History of radiation therapy 07/20/13-08/09/13    Pancreas 37.5Gy  . DDD (degenerative disc disease), lumbar   . Chronic back pain   . Stroke   . Anemia   . Ulcer 01/2014    radiation gastroduodenitis with duodenal ulcer.     Medications:  See EMR  Assessment:  58 year old female initiating antibiotics for rule-out sepsis. Mild leukocytosis febrile to 103.4., mild elevation of lactic acid.  Blood cx: pending  Goal of Therapy:  Vancomycin trough level 15-20 mcg/ml  Plan:  Vancomycin 1 g x1 then 750 mg/12h Resume gram negative coverage Vancomycin  trough as indicated F/u cultures, fever curve, resolution of infection   Hughes Better, PharmD, BCPS Clinical Pharmacist Pager: [phone] 04/09/2014 5:50 PM

## 2014-04-09 NOTE — Progress Notes (Signed)
NURSING PROGRESS NOTE  Jhene [name] [id number] Admission Data: 04/09/2014 7:43 PM Attending Provider: Cristal Ford, DO CXK:GYJEHUD,JSHFW A, MD Code Status: FULL  [name] [name] is a 58 y.o. female patient admitted from ED:  -No acute distress noted.  -No complaints of shortness of breath.  -No complaints of chest pain.    Blood pressure 119/62, pulse 85, temperature 98.7 F (37.1 C), temperature source Oral, resp. rate 20, height 5\' 6"  (1.676 m), weight 64.229 kg (141 lb 9.6 oz), SpO2 99.00%.   IV Fluids:  IV in place, occlusive dsg intact without redness, IV cath hand right, condition patent and no redness normal saline.   Allergies:  Betadine  Past Medical History:   has a past medical history of DM (diabetes mellitus), type 2; Hypertension; Depression; Anxiety; Hyperlipidemia; Pancreatitis; GERD (gastroesophageal reflux disease); Pancreatic cancer (05/2013); Hearing impaired; History of radiation therapy (07/20/13-08/09/13); DDD (degenerative disc disease), lumbar; Chronic back pain; Stroke; Anemia; and Ulcer (01/2014).  Past Surgical History:   has past surgical history that includes Appendectomy; ERCP (N/A, 05/24/2013); Tubal ligation; Endoscopic retrograde cholangiopancreatography (ercp) with propofol (N/A, 10/21/2013); biliary stent placement (N/A, 10/21/2013); TEE without cardioversion (N/A, 12/29/2013); EUS (N/A, 02/10/2014); and Bile duct stent placement.  Social History:   reports that she quit smoking about 10 months ago. She has never used smokeless tobacco. She reports that she does not drink alcohol or use illicit drugs.  Skin: intact  Patient/Family orientated to room. Information packet given to patient/family. Admission inpatient armband information verified with patient/family to include name and date of birth and placed on patient arm. Side rails up x 2, fall assessment and education completed with patient/family. Patient/family able to verbalize understanding of risk associated  with falls and verbalized understanding to call for assistance before getting out of bed. Call light within reach. Patient/family able to voice and demonstrate understanding of unit orientation instructions.    Will continue to evaluate and treat per MD orders.  Wallie Renshaw, RN   '

## 2014-04-09 NOTE — ED Notes (Signed)
CBG 116  

## 2014-04-09 NOTE — ED Notes (Signed)
Attempted report 

## 2014-04-10 DIAGNOSIS — B49 Unspecified mycosis: Secondary | ICD-10-CM | POA: Diagnosis present

## 2014-04-10 DIAGNOSIS — D649 Anemia, unspecified: Secondary | ICD-10-CM

## 2014-04-10 DIAGNOSIS — R0789 Other chest pain: Secondary | ICD-10-CM

## 2014-04-10 DIAGNOSIS — A419 Sepsis, unspecified organism: Secondary | ICD-10-CM

## 2014-04-10 DIAGNOSIS — N390 Urinary tract infection, site not specified: Secondary | ICD-10-CM

## 2014-04-10 DIAGNOSIS — Z8509 Personal history of malignant neoplasm of other digestive organs: Secondary | ICD-10-CM

## 2014-04-10 DIAGNOSIS — K831 Obstruction of bile duct: Secondary | ICD-10-CM

## 2014-04-10 DIAGNOSIS — C250 Malignant neoplasm of head of pancreas: Secondary | ICD-10-CM

## 2014-04-10 LAB — GLUCOSE, CAPILLARY
GLUCOSE-CAPILLARY: 70 mg/dL (ref 70–99)
GLUCOSE-CAPILLARY: 82 mg/dL (ref 70–99)
GLUCOSE-CAPILLARY: 132 mg/dL — AB (ref 70–99)
Glucose-Capillary: 119 mg/dL — ABNORMAL HIGH (ref 70–99)
Glucose-Capillary: 79 mg/dL (ref 70–99)
Glucose-Capillary: 204 mg/dL — ABNORMAL HIGH (ref 70–99)

## 2014-04-10 LAB — BASIC METABOLIC PANEL
BUN: 21 mg/dL (ref 6–23)
CHLORIDE: 105 meq/L (ref 96–112)
CO2: 20 mEq/L (ref 19–32)
Calcium: 8.5 mg/dL (ref 8.4–10.5)
Creatinine, Ser: 0.72 mg/dL (ref 0.50–1.10)
GFR calc non Af Amer: >90 mL/min (ref >90)
GLUCOSE: 67 mg/dL — AB (ref 70–99)
Potassium: 3.6 mEq/L — ABNORMAL LOW (ref 3.7–5.3)
Sodium: 137 mEq/L (ref 137–147)

## 2014-04-10 LAB — URINALYSIS, ROUTINE W REFLEX MICROSCOPIC
GLUCOSE, UA: NEGATIVE mg/dL
HGB URINE DIPSTICK: NEGATIVE
KETONES UR: NEGATIVE mg/dL
Leukocytes, UA: NEGATIVE
Nitrite: NEGATIVE
PROTEIN: 30 mg/dL — AB
Specific Gravity, Urine: 1.031 — ABNORMAL HIGH (ref 1.005–1.030)
UROBILINOGEN UA: 2 mg/dL — AB (ref 0.0–1.0)
pH: 5.5 (ref 5.0–8.0)

## 2014-04-10 LAB — IRON AND TIBC
Iron: 14 ug/dL — ABNORMAL LOW (ref 42–135)
Saturation Ratios: 5 % — ABNORMAL LOW (ref 20–55)
TIBC: 285 ug/dL (ref 250–470)
UIBC: 271 ug/dL (ref 125–400)

## 2014-04-10 LAB — HIV ANTIBODY (ROUTINE TESTING W REFLEX): HIV 1&2 Ab, 4th Generation: NONREACTIVE

## 2014-04-10 LAB — URINE MICROSCOPIC-ADD ON

## 2014-04-10 LAB — CBC
HEMATOCRIT: 27.1 % — AB (ref 36.0–46.0)
HEMOGLOBIN: 8.6 g/dL — AB (ref 12.0–15.0)
MCH: 26.2 pg (ref 26.0–34.0)
MCHC: 31.7 g/dL (ref 30.0–36.0)
MCV: 82.6 fL (ref 78.0–100.0)
Platelets: 153 10*3/uL (ref 150–400)
RBC: 3.28 MIL/uL — ABNORMAL LOW (ref 3.87–5.11)
RDW: 17.1 % — ABNORMAL HIGH (ref 11.5–15.5)
WBC: 7.2 10*3/uL (ref 4.0–10.5)

## 2014-04-10 LAB — HEMOGLOBIN A1C
Hgb A1c MFr Bld: 6.9 % — ABNORMAL HIGH (ref <5.7)
Mean Plasma Glucose: 151 mg/dL — ABNORMAL HIGH (ref <117)

## 2014-04-10 LAB — FOLATE: Folate: 13.2 ng/mL

## 2014-04-10 LAB — VITAMIN B12: Vitamin B-12: 1387 pg/mL — ABNORMAL HIGH (ref 211–911)

## 2014-04-10 LAB — FERRITIN: FERRITIN: 58 ng/mL (ref 10–291)

## 2014-04-10 MED ORDER — POTASSIUM CHLORIDE CRYS ER 20 MEQ PO TBCR
40.0000 meq | EXTENDED_RELEASE_TABLET | Freq: Once | ORAL | Status: AC
  Administered 2014-04-10: 40 meq via ORAL
  Filled 2014-04-10: qty 2

## 2014-04-10 MED ORDER — SODIUM CHLORIDE 0.9 % IV SOLN
100.0000 mg | Freq: Every day | INTRAVENOUS | Status: DC
  Administered 2014-04-10 – 2014-04-14 (×5): 100 mg via INTRAVENOUS
  Filled 2014-04-10 (×5): qty 100

## 2014-04-10 MED ORDER — INSULIN GLARGINE 100 UNIT/ML ~~LOC~~ SOLN
20.0000 [IU] | Freq: Every day | SUBCUTANEOUS | Status: DC
  Administered 2014-04-10 – 2014-04-13 (×4): 20 [IU] via SUBCUTANEOUS
  Filled 2014-04-10 (×5): qty 0.2

## 2014-04-10 MED ORDER — FLUCONAZOLE IN SODIUM CHLORIDE 400-0.9 MG/200ML-% IV SOLN
400.0000 mg | INTRAVENOUS | Status: DC
  Administered 2014-04-10: 400 mg via INTRAVENOUS
  Filled 2014-04-10: qty 200

## 2014-04-10 NOTE — Progress Notes (Addendum)
[facility] PROGRESS NOTE   Morenike Cuff UGQ:[id number] DOB: February 21, 1956 DOA: 04/09/2014 PCP: Philis Fendt, MD  HPI/Subjective: Seen while she was eating breakfast, denies any major complaints. Denies any pain or visual complaints  Assessment/Plan: Principal Problem:   Septicemia Active Problems:   DM (diabetes mellitus)   HTN (hypertension)   Dyslipidemia   Pancreatic cancer   SIRS (systemic inflammatory response syndrome)   Anemia   Fungemia   Sepsis -Patient came in to the hospital with fever of 103.4. -Blood cultures gram-negative rods and yeast. -Urine culture showing the same organisms. -Patient did have Streptococcus viridans bacteremia February 2015, (ceftriaxone for 2 weeks via PICC line).. -Patient did have TEE in February 2014 did not show any vegetations.  -Has biliary stent and pneumobilia, LFTs looks okay. Not sure what is around zero for these infections. -Patient is on Zosyn and vancomycin, add antifungal, ask ID to evaluate.  Gram-negative septicemia and fungemia -Likely UTI to be the source for both. -Please note that patient has history of Streptococcus viridans bacteremia in October without endocarditis. -Likely not secondary to intra-abdominal infection, no abdominal pain, normal LFTs. -CT scan done about a month ago showed pneumobilia and biliary stent in place. -I will not repeat CT scan now, unless patient developed abdominal pain or increased LFTs.  Normocytic anemia  -Patient's baseline hemoglobin appears to be approximately 8.5, currently 7.4  -Pending fecal occult  -Will obtain anemia panel  -Patient does not seem to be overtly bleeding, no melanotic stool or bright red blood per rectum.  -Currently hemodynamically stable   Diabetes mellitus  -Continue home insulin regimen of Lantus and NovoLog as well as place patient on insulin sliding scale with CBG monitoring   Hypertension  -Continue amlodipine, metoprolol, lisinopril    Pancreatic cancer  -Diagnosed in July 2014, patient also Dr. Benay Spice as an outpatient  -Patient underwent chemotherapy in September 2014   Chronic Abdominal pain with elevation of LFTs  -Patient had ERCP in December of 2014 with metal stent placed  -Continue Protonix and Carafate   GERD  -Continue Protonix   Hyperlipidemia  -Continue simvastatin   Code Status: Full code Family Communication: Plan discussed with the patient. Disposition Plan: Remains inpatient   Consultants:  ID  Procedures:  None  Antibiotics:  Zosyn, vancomycin started on 04/09/2014.  Diflucan added on 04/10/2014.   Objective: Filed Vitals:   04/10/14 0943  BP: 114/67  Pulse: 64  Temp:   Resp:     Intake/Output Summary (Last 24 hours) at 04/10/14 1058 Last data filed at 04/09/14 1730  Gross per 24 hour  Intake    744 ml  Output      0 ml  Net    744 ml   Filed Weights   04/09/14 1800 04/09/14 1853  Weight: 62.37 kg (137 lb 8 oz) 64.229 kg (141 lb 9.6 oz)    Exam: General: Alert and awake, oriented x3, not in any acute distress. HEENT: anicteric sclera, pupils reactive to light and accommodation, EOMI CVS: S1-S2 clear, no murmur rubs or gallops Chest: clear to auscultation bilaterally, no wheezing, rales or rhonchi Abdomen: soft nontender, nondistended, normal bowel sounds, no organomegaly Extremities: no cyanosis, clubbing or edema noted bilaterally Neuro: Cranial nerves II-XII intact, no focal neurological deficits  Data Reviewed: Basic Metabolic Panel:  Recent Labs Lab 04/09/14 1456 04/10/14 0730  NA 140 137  K 4.6 3.6*  CL 102 105  CO2 21 20  GLUCOSE 114* 67*  BUN 19 21  CREATININE  0.69 0.72  CALCIUM 9.6 8.5   Liver Function Tests:  Recent Labs Lab 04/09/14 1456  AST 313*  ALT 153*  ALKPHOS 871*  BILITOT 1.0  PROT 8.8*  ALBUMIN 3.4*   No results found for this basename: LIPASE, AMYLASE,  in the last 168 hours No results found for this basename:  AMMONIA,  in the last 168 hours CBC:  Recent Labs Lab 04/09/14 1456 04/10/14 0730  WBC 11.1* 7.2  NEUTROABS 10.0*  --   HGB 7.4* 8.6*  HCT 22.7* 27.1*  MCV 81.9 82.6  PLT 251 153   Cardiac Enzymes: No results found for this basename: CKTOTAL, CKMB, CKMBINDEX, TROPONINI,  in the last 168 hours BNP (last 3 results) No results found for this basename: PROBNP,  in the last 8760 hours CBG:  Recent Labs Lab 04/09/14 1858 04/09/14 2028 04/09/14 2229 04/10/14 0828 04/10/14 0937  GLUCAP 166* 166* 112* 70 82    Micro Recent Results (from the past 240 hour(s))  CULTURE, BLOOD (ROUTINE X 2)     Status: None   Collection Time    04/09/14  2:27 PM      Result Value Ref Range Status   Specimen Description BLOOD LEFT ARM   Final   Special Requests BOTTLES DRAWN AEROBIC AND ANAEROBIC 10CC   Final   Culture  Setup Time     Final   Value: 04/09/2014 21:19     Performed at [facility]   Culture     Final   Value: GRAM NEGATIVE RODS     YEAST     Note: Gram Stain Report Called to,Read Back By and Verified With: LINDA BUN 04/10/14 @ 10:15AM BY RUSCOE A.     Performed at [facility]   Report Status PENDING   Incomplete     Studies: Dg Chest Portable 1 View  04/09/2014   CLINICAL DATA:  Fever, pancreatitis, pancreatic cancer  EXAM: PORTABLE CHEST - 1 VIEW  COMPARISON:  12/31/2013  FINDINGS: Cardiomediastinal silhouette is stable. No acute infiltrate or pleural effusion. No pulmonary edema. Mild degenerative changes thoracic spine.  IMPRESSION: No active disease.   Electronically Signed   By: Lahoma Crocker M.D.   On: 04/09/2014 15:52    Scheduled Meds: . amLODipine  10 mg Oral Daily  . aspirin EC  81 mg Oral Daily  . gabapentin  300 mg Oral TID  . insulin aspart  0-9 Units Subcutaneous TID WC  . insulin aspart  10 Units Subcutaneous TID WC  . insulin glargine  30 Units Subcutaneous QHS  . lipase/protease/amylase  1 capsule Oral TID AC  . lisinopril  40 mg Oral Daily   . loratadine  10 mg Oral Daily  . metoCLOPramide  5 mg Oral TID AC  . metoprolol tartrate  25 mg Oral BID  . pantoprazole  40 mg Oral Daily  . piperacillin-tazobactam (ZOSYN)  IV  3.375 g Intravenous 3 times per day  . potassium chloride  10 mEq Oral Daily  . potassium chloride  40 mEq Oral Once  . simvastatin  10 mg Oral QHS  . vancomycin  750 mg Intravenous Q12H   Continuous Infusions: . sodium chloride 75 mL/hr (04/09/14 1851)       Time spent: 35 minutes    Rovena Hearld [facility] [address]  [facility] Pager [phone] If 7PM-7AM, please contact night-coverage at [email or web address], password [facility] 04/10/2014, 10:58 AM  LOS: 1 day

## 2014-04-10 NOTE — Progress Notes (Signed)
Attempted to obtain admission questionsx2 Patient refused x2. Patient states, "my daughter will be here later," will attempt to ask daughter admission questions.

## 2014-04-10 NOTE — Progress Notes (Signed)
Hypoglycemic Event  CBG: 70  Treatment: 15 GM carbohydrate snack  Symptoms: None  Follow-up CBG: Time: 9:37AM CBG Result: 82  Possible Reasons for Event: Inadequate meal intake  Comments/MD notified: Elmahi    Lile Mccurley L Trayonna Bachmeier  Remember to initiate Hypoglycemia Order Set & complete

## 2014-04-10 NOTE — Consult Note (Signed)
[facility]  Date of Admission:  04/09/2014  Date of Consult:  04/10/2014  Reason for Consult: fungemia, bacteremia Referring Physician: Hartford Poli  Impression/Recommendation Fungemia Bacteremia UTI Pancreatic CA  Would Start mycafungin Stop vanco Repeat BCx after 24h tx Consider TEE for fungemia Repeat CT abdomen/pelvis Check HIV  Comment-  Although her urine Cx is +, this seems like a less likely source than her GI tract with known malignancy. Awit sensi and ID of her GNR.  Many species of fungus are resistant to diflucan, would start with mycafungin till we have ID or organism.   Thank you so much for this interesting consult,   Campbell Riches (pager) [phone] [email or web address]  [name] [name] is an 58 y.o. female.  HPI: 58 y.o. 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 She has had abdomen/pelvis 11/01/2013 during a visit to emergency Department for evaluation of abdominal pain. 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   She was admitted on 12/22/13 for fevers of 104.9Fand she was found to have viridans strep ( 2/2 sets). TEE was negative for vegetation. She was treated with ceftriaxone for 28 days due to concern for septic thrombus.   She returned to hospital for abd pain in March.  She returns now on 5-23 with fatigue, fever and chills. In ED she had temp 103.4, WBC 11.1, and she was started on vanco/zosyn. Her BCx have now grown GNR and yeast in 1/2. Her UCx are growing same.    Past Medical History  Diagnosis Date  . DM (diabetes mellitus), type 2     requires insulin  . Hypertension   . Depression   . Anxiety   . Hyperlipidemia   . Pancreatitis   . GERD (gastroesophageal reflux disease)   . Pancreatic  cancer 05/2013    adenocarcinoma on ERCP/FNA  . Hearing impaired     lost the last of her hearing aids and cn not get another one.  hearing is better via right ear.   Marland Kitchen History of radiation therapy 07/20/13-08/09/13    Pancreas 37.5Gy  . DDD (degenerative disc disease), lumbar   . Chronic back pain   . Stroke   . Anemia   . Ulcer 01/2014    radiation gastroduodenitis with duodenal ulcer.     Past Surgical History  Procedure Laterality Date  . Appendectomy    . Ercp N/A 05/24/2013    Procedure: ENDOSCOPIC RETROGRADE CHOLANGIOPANCREATOGRAPHY (ERCP);  Surgeon: Milus Banister, MD;  Location: [facility];  Service: Endoscopy;  Laterality: N/A;  MAC vs. general per anesthesia   . Tubal ligation    . Endoscopic retrograde cholangiopancreatography (ercp) with propofol N/A 10/21/2013    Procedure: ENDOSCOPIC RETROGRADE CHOLANGIOPANCREATOGRAPHY (ERCP) WITH PROPOFOL;  Surgeon: Milus Banister, MD;  Location: [facility];  Service: Endoscopy;  Laterality: N/A;  Stent removal   . Biliary stent placement N/A 10/21/2013    Procedure: BILIARY STENT PLACEMENT;  Surgeon: Milus Banister, MD;  Location: [facility];  Service: Endoscopy;  Laterality: N/A;  . Tee without cardioversion N/A 12/29/2013    Procedure: TRANSESOPHAGEAL ECHOCARDIOGRAM (TEE);  Surgeon: Dorothy Spark, MD;  Location: [facility];  Service: Cardiovascular;  Laterality: N/A;  . Eus N/A 02/10/2014    Procedure: UPPER ENDOSCOPIC ULTRASOUND (EUS) LINEAR;  Surgeon: Milus Banister, MD;  Location: [facility];  Service: Endoscopy;  Laterality: N/A;  celiac plexus neurolysis  . Bile duct stent placement       Allergies  Allergen Reactions  . Betadine [Povidone Iodine] Itching and Rash    Medications:  Scheduled: . amLODipine  10 mg Oral Daily  . aspirin EC  81 mg Oral Daily  . fluconazole (DIFLUCAN) IV  400 mg Intravenous Q24H  . gabapentin  300 mg Oral TID  . insulin aspart  0-9 Units Subcutaneous TID WC  . insulin aspart  10 Units  Subcutaneous TID WC  . insulin glargine  30 Units Subcutaneous QHS  . lipase/protease/amylase  1 capsule Oral TID AC  . lisinopril  40 mg Oral Daily  . loratadine  10 mg Oral Daily  . metoCLOPramide  5 mg Oral TID AC  . metoprolol tartrate  25 mg Oral BID  . pantoprazole  40 mg Oral Daily  . piperacillin-tazobactam (ZOSYN)  IV  3.375 g Intravenous 3 times per day  . potassium chloride  10 mEq Oral Daily  . simvastatin  10 mg Oral QHS  . vancomycin  750 mg Intravenous Q12H    Abtx:  Anti-infectives   Start     Dose/Rate Route Frequency Ordered Stop   04/10/14 1200  fluconazole (DIFLUCAN) IVPB 400 mg     400 mg 100 mL/hr over 120 Minutes Intravenous Every 24 hours 04/10/14 1131     04/10/14 0500  vancomycin (VANCOCIN) IVPB 750 mg/150 ml premix     750 mg 150 mL/hr over 60 Minutes Intravenous Every 12 hours 04/09/14 1751     04/09/14 2200  piperacillin-tazobactam (ZOSYN) IVPB 3.375 g     3.375 g 12.5 mL/hr over 240 Minutes Intravenous 3 times per day 04/09/14 1839     04/09/14 1645  vancomycin (VANCOCIN) IVPB 1000 mg/200 mL premix     1,000 mg 200 mL/hr over 60 Minutes Intravenous  Once 04/09/14 1631 04/09/14 1830   04/09/14 1515  piperacillin-tazobactam (ZOSYN) IVPB 3.375 g     3.375 g 100 mL/hr over 30 Minutes Intravenous  Once 04/09/14 1509 04/09/14 1552      Total days of antibiotics 2 *(vanco/zosyn)          Social History:  reports that she quit smoking about 10 months ago. She has never used smokeless tobacco. She reports that she does not drink alcohol or use illicit drugs.  Family History  Problem Relation Age of Onset  . Diabetes type II Mother   . Hypertension Mother   . Heart attack Father   . HIV/AIDS Brother     General ROS: no change in vision. has had DM for "years and years", no neuropathy. + constipation. no diarrhea. denies port/catheter or any other indwelling line. + dysuria. see HPI.   Blood pressure 114/67, pulse 64, temperature 97.9 F (36.6 C),  temperature source Oral, resp. rate 20, height _0  (1.676 m), weight 64.229 kg (141 lb 9.6 oz), SpO2 96.00%. General appearance: alert, cooperative and no distress Eyes: negative findings: conjunctivae and sclerae normal and pupils equal, round, reactive to light and accomodation, positive findings: proptosis Throat: normal findings: oropharynx pink & moist without lesions or evidence of thrush Neck: no adenopathy and supple, symmetrical, trachea midline Back: no CVA tenderness Lungs: clear to auscultation bilaterally Heart: regular rate and rhythm Abdomen: normal findings: bowel sounds normal and soft, non-tender Extremities: edema none and no diabetic foot ulcers Neurologic: Sensory: light touch grossly normal in LE.  Results for orders placed during the hospital encounter of 04/09/14 (from the past 48 hour(s))  CBG MONITORING, ED     Status: Abnormal   Collection Time    04/09/14  2:02 PM      Result Value Ref Range   Glucose-Capillary 116 (*) 70 - 99 mg/dL  LACTIC ACID, PLASMA     Status: Abnormal   Collection Time    04/09/14  2:22 PM      Result Value Ref Range   Lactic Acid, Venous 2.9 (*) 0.5 - 2.2 mmol/L  CULTURE, BLOOD (ROUTINE X 2)     Status: None   Collection Time    04/09/14  2:22 PM      Result Value Ref Range   Specimen Description BLOOD RIGHT HAND     Special Requests BOTTLES DRAWN AEROBIC AND ANAEROBIC 3CC     Culture  Setup Time       Value: 04/09/2014 21:19     Performed at [facility]   Culture       Value:        BLOOD CULTURE RECEIVED NO GROWTH TO DATE CULTURE WILL BE HELD FOR 5 DAYS BEFORE ISSUING A FINAL NEGATIVE REPORT     Performed at [facility]   Report Status PENDING    CULTURE, BLOOD (ROUTINE X 2)     Status: None   Collection Time    04/09/14  2:27 PM      Result Value Ref Range   Specimen Description BLOOD LEFT ARM     Special Requests BOTTLES DRAWN AEROBIC AND ANAEROBIC 10CC     Culture  Setup Time       Value:  04/09/2014 21:19     Performed at [facility]   Culture       Value: GRAM NEGATIVE RODS     YEAST     Note: Gram Stain Report Called to,Read Back By and Verified With: LINDA BUN 04/10/14 @ 10:15AM BY RUSCOE A.     Performed at [facility]   Report Status PENDING    CBC WITH DIFFERENTIAL     Status: Abnormal   Collection Time    04/09/14  2:56 PM      Result Value Ref Range   WBC 11.1 (*) 4.0 - 10.5 K/uL   RBC 2.77 (*) 3.87 - 5.11 MIL/uL   Hemoglobin 7.4 (*) 12.0 - 15.0 g/dL   HCT 22.7 (*) 36.0 - 46.0 %   MCV 81.9  78.0 - 100.0 fL   MCH 26.7  26.0 - 34.0 pg   MCHC 32.6  30.0 - 36.0 g/dL   RDW 17.0 (*) 11.5 - 15.5 %   Platelets 251  150 - 400 K/uL   Neutrophils Relative % 90 (*) 43 - 77 %   Neutro Abs 10.0 (*) 1.7 - 7.7 K/uL   Lymphocytes Relative 3 (*) 12 - 46 %   Lymphs Abs 0.3 (*) 0.7 - 4.0 K/uL   Monocytes Relative 6  3 - 12 %   Monocytes Absolute 0.7  0.1 - 1.0 K/uL   Eosinophils Relative 1  0 - 5 %   Eosinophils Absolute 0.1  0.0 - 0.7 K/uL   Basophils Relative 0  0 - 1 %   Basophils Absolute 0.0  0.0 - 0.1 K/uL  COMPREHENSIVE METABOLIC PANEL     Status: Abnormal   Collection Time    04/09/14  2:56 PM      Result Value  Ref Range   Sodium 140  137 - 147 mEq/L   Potassium 4.6  3.7 - 5.3 mEq/L   Chloride 102  96 - 112 mEq/L   CO2 21  19 - 32 mEq/L   Glucose, Bld 114 (*) 70 - 99 mg/dL   BUN 19  6 - 23 mg/dL   Creatinine, Ser 0.69  0.50 - 1.10 mg/dL   Calcium 9.6  8.4 - 10.5 mg/dL   Total Protein 8.8 (*) 6.0 - 8.3 g/dL   Albumin 3.4 (*) 3.5 - 5.2 g/dL   AST 313 (*) 0 - 37 U/L   Comment: HEMOLYZED SPECIMEN, RESULTS MAY BE AFFECTED   ALT 153 (*) 0 - 35 U/L   Alkaline Phosphatase 871 (*) 39 - 117 U/L   Total Bilirubin 1.0  0.3 - 1.2 mg/dL   GFR calc non Af Amer >90  >90 mL/min   GFR calc Af Amer >90  >90 mL/min   Comment: (NOTE)     The eGFR has been calculated using the CKD EPI equation.     This calculation has not been validated in all clinical  situations.     eGFR's persistently <90 mL/min signify possible Chronic Kidney     Disease.  URINALYSIS, ROUTINE W REFLEX MICROSCOPIC     Status: Abnormal   Collection Time    04/09/14  3:23 PM      Result Value Ref Range   Color, Urine AMBER (*) YELLOW   Comment: BIOCHEMICALS MAY BE AFFECTED BY COLOR   APPearance CLEAR  CLEAR   Specific Gravity, Urine 1.025  1.005 - 1.030   pH 6.0  5.0 - 8.0   Glucose, UA 100 (*) NEGATIVE mg/dL   Hgb urine dipstick NEGATIVE  NEGATIVE   Bilirubin Urine SMALL (*) NEGATIVE   Ketones, ur NEGATIVE  NEGATIVE mg/dL   Protein, ur NEGATIVE  NEGATIVE mg/dL   Urobilinogen, UA 4.0 (*) 0.0 - 1.0 mg/dL   Nitrite NEGATIVE  NEGATIVE   Leukocytes, UA TRACE (*) NEGATIVE  URINE MICROSCOPIC-ADD ON     Status: Abnormal   Collection Time    04/09/14  3:23 PM      Result Value Ref Range   Squamous Epithelial / LPF MANY (*) RARE   WBC, UA 3-6  <3 WBC/hpf   Bacteria, UA FEW (*) RARE  TYPE AND SCREEN     Status: None   Collection Time    04/09/14  4:22 PM      Result Value Ref Range   ABO/RH(D) A POS     Antibody Screen NEG     Sample Expiration 04/12/2014    ABO/RH     Status: None   Collection Time    04/09/14  4:22 PM      Result Value Ref Range   ABO/RH(D) A POS    OCCULT BLOOD X 1 CARD TO LAB, STOOL     Status: Abnormal   Collection Time    04/09/14  4:47 PM      Result Value Ref Range   Fecal Occult Bld POSITIVE (*) NEGATIVE  GLUCOSE, CAPILLARY     Status: Abnormal   Collection Time    04/09/14  6:58 PM      Result Value Ref Range   Glucose-Capillary 166 (*) 70 - 99 mg/dL  VITAMIN B12     Status: Abnormal   Collection Time    04/09/14  8:15 PM      Result Value Ref Range   Vitamin  B-12 1387 (*) 211 - 911 pg/mL   Comment: Performed at [facility]     Status: None   Collection Time    04/09/14  8:15 PM      Result Value Ref Range   Folate 13.2     Comment: (NOTE)     Reference Ranges            Deficient:       0.4 - 3.3  ng/mL            Indeterminate:   3.4 - 5.4 ng/mL            Normal:              > 5.4 ng/mL     Performed at [facility]     Status: Abnormal   Collection Time    04/09/14  8:15 PM      Result Value Ref Range   Iron 14 (*) 42 - 135 ug/dL   TIBC 285  250 - 470 ug/dL   Saturation Ratios 5 (*) 20 - 55 %   UIBC 271  125 - 400 ug/dL   Comment: Performed at [facility]     Status: None   Collection Time    04/09/14  8:15 PM      Result Value Ref Range   Ferritin 58  10 - 291 ng/mL   Comment: Performed at [facility]     Status: Abnormal   Collection Time    04/09/14  8:15 PM      Result Value Ref Range   Retic Ct Pct 1.1  0.4 - 3.1 %   RBC. 3.51 (*) 3.87 - 5.11 MIL/uL   Retic Count, Manual 38.6  19.0 - 186.0 K/uL  HEMOGLOBIN A1C     Status: Abnormal   Collection Time    04/09/14  8:15 PM      Result Value Ref Range   Hemoglobin A1C 6.9 (*) <5.7 %   Comment: (NOTE)                                                                               According to the ADA Clinical Practice Recommendations for 2011, when     HbA1c is used as a screening test:      >=6.5%   Diagnostic of Diabetes Mellitus               (if abnormal result is confirmed)     5.7-6.4%   Increased risk of developing Diabetes Mellitus     References:Diagnosis and Classification of Diabetes Mellitus,Diabetes     JIRC,7893,81(OFBPZ 1):S62-S69 and Standards of Medical Care in             Diabetes - 2011,Diabetes Care,2011,34 (Suppl 1):S11-S61.   Mean Plasma Glucose 151 (*) <117 mg/dL   Comment: Performed at [place], [facility]     Status: Abnormal   Collection Time    04/09/14  8:28 PM      Result Value Ref Range   Glucose-Capillary 166 (*) 70 - 99 mg/dL   Comment 1 Documented  in Chart     Comment 2 Notify RN    GLUCOSE, CAPILLARY     Status: Abnormal   Collection Time    04/09/14 10:29 PM      Result Value Ref Range     Glucose-Capillary 112 (*) 70 - 99 mg/dL  BASIC METABOLIC PANEL     Status: Abnormal   Collection Time    04/10/14  7:30 AM      Result Value Ref Range   Sodium 137  137 - 147 mEq/L   Potassium 3.6 (*) 3.7 - 5.3 mEq/L   Comment: DELTA CHECK NOTED   Chloride 105  96 - 112 mEq/L   CO2 20  19 - 32 mEq/L   Glucose, Bld 67 (*) 70 - 99 mg/dL   BUN 21  6 - 23 mg/dL   Creatinine, Ser 0.72  0.50 - 1.10 mg/dL   Calcium 8.5  8.4 - 10.5 mg/dL   GFR calc non Af Amer >90  >90 mL/min   GFR calc Af Amer >90  >90 mL/min   Comment: (NOTE)     The eGFR has been calculated using the CKD EPI equation.     This calculation has not been validated in all clinical situations.     eGFR's persistently <90 mL/min signify possible Chronic Kidney     Disease.  CBC     Status: Abnormal   Collection Time    04/10/14  7:30 AM      Result Value Ref Range   WBC 7.2  4.0 - 10.5 K/uL   RBC 3.28 (*) 3.87 - 5.11 MIL/uL   Hemoglobin 8.6 (*) 12.0 - 15.0 g/dL   HCT 27.1 (*) 36.0 - 46.0 %   MCV 82.6  78.0 - 100.0 fL   MCH 26.2  26.0 - 34.0 pg   MCHC 31.7  30.0 - 36.0 g/dL   RDW 17.1 (*) 11.5 - 15.5 %   Platelets 153  150 - 400 K/uL   Comment: DELTA CHECK NOTED     REPEATED TO VERIFY  URINALYSIS, ROUTINE W REFLEX MICROSCOPIC     Status: Abnormal   Collection Time    04/10/14  8:13 AM      Result Value Ref Range   Color, Urine AMBER (*) YELLOW   Comment: BIOCHEMICALS MAY BE AFFECTED BY COLOR   APPearance CLEAR  CLEAR   Specific Gravity, Urine 1.031 (*) 1.005 - 1.030   pH 5.5  5.0 - 8.0   Glucose, UA NEGATIVE  NEGATIVE mg/dL   Hgb urine dipstick NEGATIVE  NEGATIVE   Bilirubin Urine MODERATE (*) NEGATIVE   Ketones, ur NEGATIVE  NEGATIVE mg/dL   Protein, ur 30 (*) NEGATIVE mg/dL   Urobilinogen, UA 2.0 (*) 0.0 - 1.0 mg/dL   Nitrite NEGATIVE  NEGATIVE   Leukocytes, UA NEGATIVE  NEGATIVE  URINE MICROSCOPIC-ADD ON     Status: Abnormal   Collection Time    04/10/14  8:13 AM      Result Value Ref Range    Squamous Epithelial / LPF MANY (*) RARE   WBC, UA 0-2  <3 WBC/hpf  GLUCOSE, CAPILLARY     Status: None   Collection Time    04/10/14  8:28 AM      Result Value Ref Range   Glucose-Capillary 70  70 - 99 mg/dL  GLUCOSE, CAPILLARY     Status: None   Collection Time    04/10/14  9:37 AM      Result Value  Ref Range   Glucose-Capillary 82  70 - 99 mg/dL  GLUCOSE, CAPILLARY     Status: Abnormal   Collection Time    04/10/14 11:43 AM      Result Value Ref Range   Glucose-Capillary 119 (*) 70 - 99 mg/dL      Component Value Date/Time   SDES BLOOD LEFT ARM 04/09/2014 1427   SPECREQUEST BOTTLES DRAWN AEROBIC AND ANAEROBIC 10CC 04/09/2014 1427   CULT  Value: GRAM NEGATIVE RODS YEAST Note: Gram Stain Report Called to,Read Back By and Verified With: LINDA BUN 04/10/14 @ 10:15AM BY RUSCOE A. Performed at [facility] 04/09/2014 1427   REPTSTATUS PENDING 04/09/2014 1427   Dg Chest Portable 1 View  04/09/2014   CLINICAL DATA:  Fever, pancreatitis, pancreatic cancer  EXAM: PORTABLE CHEST - 1 VIEW  COMPARISON:  12/31/2013  FINDINGS: Cardiomediastinal silhouette is stable. No acute infiltrate or pleural effusion. No pulmonary edema. Mild degenerative changes thoracic spine.  IMPRESSION: No active disease.   Electronically Signed   By: Lahoma Crocker M.D.   On: 04/09/2014 15:52   Recent Results (from the past 240 hour(s))  CULTURE, BLOOD (ROUTINE X 2)     Status: None   Collection Time    04/09/14  2:22 PM      Result Value Ref Range Status   Specimen Description BLOOD RIGHT HAND   Final   Special Requests BOTTLES DRAWN AEROBIC AND ANAEROBIC 3CC   Final   Culture  Setup Time     Final   Value: 04/09/2014 21:19     Performed at [facility]   Culture     Final   Value:        BLOOD CULTURE RECEIVED NO GROWTH TO DATE CULTURE WILL BE HELD FOR 5 DAYS BEFORE ISSUING A FINAL NEGATIVE REPORT     Performed at [facility]   Report Status PENDING   Incomplete  CULTURE, BLOOD (ROUTINE X 2)      Status: None   Collection Time    04/09/14  2:27 PM      Result Value Ref Range Status   Specimen Description BLOOD LEFT ARM   Final   Special Requests BOTTLES DRAWN AEROBIC AND ANAEROBIC 10CC   Final   Culture  Setup Time     Final   Value: 04/09/2014 21:19     Performed at [facility]   Culture     Final   Value: GRAM NEGATIVE RODS     YEAST     Note: Gram Stain Report Called to,Read Back By and Verified With: Elverta BUN 04/10/14 @ 10:15AM BY RUSCOE A.     Performed at [facility]   Report Status PENDING   Incomplete      04/10/2014, 1:43 PM     LOS: 1 day     **Disclaimer: This note may have been dictated with voice recognition software. Similar sounding words can inadvertently be transcribed and this note may contain transcription errors which may not have been corrected upon publication of note.**

## 2014-04-10 NOTE — Progress Notes (Signed)
ANTIBIOTIC CONSULT NOTE - FOLLOW UP  Pharmacy Consult for Vancomycin, Zosyn and Diflucan Indication: rule out sepsis and yeast in blood  Allergies  Allergen Reactions  . Betadine [Povidone Iodine] Itching and Rash    Patient Measurements: Height: 5\' 6"  (167.6 cm) Weight: 141 lb 9.6 oz (64.229 kg) IBW/kg (Calculated) : 59.3  Vital Signs: Temp: 97.9 F (36.6 C) (05/24 0542) Temp src: Oral (05/24 0542) BP: 114/67 mmHg (05/24 0943) Pulse Rate: 64 (05/24 0943)  Labs:  Recent Labs  04/09/14 1456 04/10/14 0730  WBC 11.1* 7.2  HGB 7.4* 8.6*  PLT 251 153  CREATININE 0.69 0.72   Estimated Creatinine Clearance: 70 ml/min (by C-G formula based on Cr of 0.72).   Microbiology: Recent Results (from the past 720 hour(s))  CULTURE, BLOOD (ROUTINE X 2)     Status: None   Collection Time    04/09/14  2:27 PM      Result Value Ref Range Status   Specimen Description BLOOD LEFT ARM   Final   Special Requests BOTTLES DRAWN AEROBIC AND ANAEROBIC 10CC   Final   Culture  Setup Time     Final   Value: 04/09/2014 21:19     Performed at [facility]   Culture     Final   Value: GRAM NEGATIVE RODS     YEAST     Note: Gram Stain Report Called to,Read Back By and Verified With: Hitchcock BUN 04/10/14 @ 10:15AM BY RUSCOE A.     Performed at [facility]   Report Status PENDING   Incomplete   Assessment:    Vancomycin and Zosyn begun yesterday.  Adding Diflucan today for yeast in blood culture. GNR also growing. Hx pancreatic cancer, has biliary stent. Transaminases up (313/153); total bilirubin 1.0. ID to see.  Goal of Therapy:  Vancomycin trough level 15-20 mcg/ml appropriate Zosyn and Diflucan doses for renal function and infection  Plan:   Continue Vancomycin 750 mg IV q12hrs.  Continue Zosyn 3.375 grams IV q8hrs (each over 4 hrs).  Begin Diflucan 400 mg IV q24hrs.  Change bmet to cmet for am, to f/u LFTs.  May need to hold Zocor.  Follow renal function, culture data,  clinical progress.  Will follow up ID input.  Arty Baumgartner, [place] Pager: [phone] 04/10/2014,11:54 AM

## 2014-04-11 DIAGNOSIS — B49 Unspecified mycosis: Secondary | ICD-10-CM

## 2014-04-11 DIAGNOSIS — B9689 Other specified bacterial agents as the cause of diseases classified elsewhere: Secondary | ICD-10-CM

## 2014-04-11 DIAGNOSIS — R7881 Bacteremia: Secondary | ICD-10-CM

## 2014-04-11 LAB — CBC
HCT: 27 % — ABNORMAL LOW (ref 36.0–46.0)
Hemoglobin: 8.7 g/dL — ABNORMAL LOW (ref 12.0–15.0)
MCH: 26.4 pg (ref 26.0–34.0)
MCHC: 32.2 g/dL (ref 30.0–36.0)
MCV: 82.1 fL (ref 78.0–100.0)
PLATELETS: 170 10*3/uL (ref 150–400)
RBC: 3.29 MIL/uL — ABNORMAL LOW (ref 3.87–5.11)
RDW: 17.2 % — AB (ref 11.5–15.5)
WBC: 5.2 10*3/uL (ref 4.0–10.5)

## 2014-04-11 LAB — COMPREHENSIVE METABOLIC PANEL
ALBUMIN: 2.6 g/dL — AB (ref 3.5–5.2)
ALT: 86 U/L — AB (ref 0–35)
AST: 105 U/L — AB (ref 0–37)
Alkaline Phosphatase: 554 U/L — ABNORMAL HIGH (ref 39–117)
BUN: 13 mg/dL (ref 6–23)
CO2: 22 mEq/L (ref 19–32)
Calcium: 8.6 mg/dL (ref 8.4–10.5)
Chloride: 103 mEq/L (ref 96–112)
Creatinine, Ser: 0.78 mg/dL (ref 0.50–1.10)
GFR calc Af Amer: >90 mL/min (ref >90)
GFR, EST NON AFRICAN AMERICAN: 89 mL/min — AB (ref >90)
Glucose, Bld: 228 mg/dL — ABNORMAL HIGH (ref 70–99)
Potassium: 4 mEq/L (ref 3.7–5.3)
Sodium: 134 mEq/L — ABNORMAL LOW (ref 137–147)
Total Bilirubin: 0.6 mg/dL (ref 0.3–1.2)
Total Protein: 6.9 g/dL (ref 6.0–8.3)

## 2014-04-11 LAB — URINE CULTURE
COLONY COUNT: NO GROWTH
Culture: NO GROWTH

## 2014-04-11 LAB — GLUCOSE, CAPILLARY
GLUCOSE-CAPILLARY: 126 mg/dL — AB (ref 70–99)
GLUCOSE-CAPILLARY: 210 mg/dL — AB (ref 70–99)
Glucose-Capillary: 273 mg/dL — ABNORMAL HIGH (ref 70–99)
Glucose-Capillary: 147 mg/dL — ABNORMAL HIGH (ref 70–99)

## 2014-04-11 MED ORDER — ENOXAPARIN SODIUM 40 MG/0.4ML ~~LOC~~ SOLN
40.0000 mg | SUBCUTANEOUS | Status: DC
  Filled 2014-04-11 (×4): qty 0.4

## 2014-04-11 NOTE — Progress Notes (Signed)
PATIENT DETAILS Name: [name] [name] Age: 58 y.o. Sex: female Date of Birth: 04-Jun-1956 Admit Date: 04/09/2014 Admitting Physician Cristal Ford, DO [id number],TMHDQ A, MD  Subjective: No major complaints.Afebrile.  Assessment/Plan: Principal Problem:   Sepsis -secondary to gram neg bacteremia and fungemia -sepsis pathophysiology has resolved with IVF, IV Abx and Antifungals  Active Problems: Gram-negative bacteremia and fungemia -on empiric Zosyn (started 5/23) and Micafungin (started on 5/24) -await final blood culture results, ID following. Improved clinically-afebrile -?2/2 GI tract portal    DM (diabetes mellitus) -CBG's stable-continue with Lantus 20 units and SSI for now-follow CBG's and adjust accordingly    HTN (hypertension) -controlled, c/w Amlodipine,Lisinopril and Metoprolol    Dyslipidemia -c/w Statins    Anemia -consistent with anemia of chronic disease -follow Hb    Pancreatic cancer -Adenocarcinoma of the pancreas presenting with an obstructing pancreas head mass, clinical stage II versus III (T3-T4, N1)-previously treated Xeloda and radiation.Reveiwed Oncology notes-plans were to be treated with a first cycle of gemcitabine/Abraxane on 04/15/2014. Will notify oncology tomorrow  Disposition: Remain inpatient  DVT Prophylaxis: Prophylactic Lovenox   Code Status: Full code  Family Communication None at bedside  Procedures:  None  CONSULTS:  ID  Time spent 40 minutes-which includes 50% of the time with face-to-face with patient/ family and coordinating care related to the above assessment and plan.    MEDICATIONS: Scheduled Meds: . amLODipine  10 mg Oral Daily  . aspirin EC  81 mg Oral Daily  . gabapentin  300 mg Oral TID  . insulin aspart  0-9 Units Subcutaneous TID WC  . insulin glargine  20 Units Subcutaneous QHS  . lipase/protease/amylase  1 capsule Oral TID AC  . lisinopril  40 mg Oral Daily  . loratadine  10 mg Oral  Daily  . metoCLOPramide  5 mg Oral TID AC  . metoprolol tartrate  25 mg Oral BID  . micafungin [facility]) IV  100 mg Intravenous Daily  . pantoprazole  40 mg Oral Daily  . piperacillin-tazobactam (ZOSYN)  IV  3.375 g Intravenous 3 times per day  . potassium chloride  10 mEq Oral Daily  . simvastatin  10 mg Oral QHS   Continuous Infusions:  PRN Meds:.acetaminophen, acetaminophen, docusate sodium, morphine injection, ondansetron, oxyCODONE, polyethylene glycol, simethicone  Antibiotics: Anti-infectives   Start     Dose/Rate Route Frequency Ordered Stop   04/10/14 1500  micafungin (MYCAMINE) 100 mg in sodium chloride 0.9 % 100 mL IVPB     100 mg 100 mL/hr over 1 Hours Intravenous Daily 04/10/14 1403     04/10/14 1200  fluconazole (DIFLUCAN) IVPB 400 mg  Status:  Discontinued     400 mg 100 mL/hr over 120 Minutes Intravenous Every 24 hours 04/10/14 1131 04/10/14 1403   04/10/14 0500  vancomycin (VANCOCIN) IVPB 750 mg/150 ml premix  Status:  Discontinued     750 mg 150 mL/hr over 60 Minutes Intravenous Every 12 hours 04/09/14 1751 04/10/14 1403   04/09/14 2200  piperacillin-tazobactam (ZOSYN) IVPB 3.375 g     3.375 g 12.5 mL/hr over 240 Minutes Intravenous 3 times per day 04/09/14 1839     04/09/14 1645  vancomycin (VANCOCIN) IVPB 1000 mg/200 mL premix     1,000 mg 200 mL/hr over 60 Minutes Intravenous  Once 04/09/14 1631 04/09/14 1830   04/09/14 1515  piperacillin-tazobactam (ZOSYN) IVPB 3.375 g     3.375 g 100 mL/hr over 30 Minutes Intravenous  Once 04/09/14 1509  04/09/14 1552       PHYSICAL EXAM: Vital signs in last 24 hours: Filed Vitals:   04/10/14 1406 04/10/14 2034 04/10/14 2235 04/11/14 0615  BP: 125/75 172/93 134/64 128/74  Pulse: 66 82 77 75  Temp: 98 F (36.7 C) 98.4 F (36.9 C)  98 F (36.7 C)  TempSrc: Oral Oral  Oral  Resp: 20 20 20 20   Height:      Weight:      SpO2: 97% 98% 97% 97%    Weight change:  Filed Weights   04/09/14 1800 04/09/14 1853    Weight: 62.37 kg (137 lb 8 oz) 64.229 kg (141 lb 9.6 oz)   Body mass index is 22.87 kg/(m^2).   Gen Exam: Awake and alert with clear speech.   Neck: Supple, No JVD.   Chest: B/L Clear.   CVS: S1 S2 Regular, no murmurs.  Abdomen: soft, BS +, non tender, non distended.  Extremities: no edema, lower extremities warm to touch. Neurologic: Non Focal.   Skin: No Rash.   Wounds: N/A.   Intake/Output from previous day:  Intake/Output Summary (Last 24 hours) at 04/11/14 1141 Last data filed at 04/10/14 1830  Gross per 24 hour  Intake    444 ml  Output      0 ml  Net    444 ml     LAB RESULTS: CBC  Recent Labs Lab 04/09/14 1456 04/10/14 0730 04/11/14 0445  WBC 11.1* 7.2 5.2  HGB 7.4* 8.6* 8.7*  HCT 22.7* 27.1* 27.0*  PLT 251 153 170  MCV 81.9 82.6 82.1  MCH 26.7 26.2 26.4  MCHC 32.6 31.7 32.2  RDW 17.0* 17.1* 17.2*  LYMPHSABS 0.3*  --   --   MONOABS 0.7  --   --   EOSABS 0.1  --   --   BASOSABS 0.0  --   --     Chemistries   Recent Labs Lab 04/09/14 1456 04/10/14 0730 04/11/14 0445  NA 140 137 134*  K 4.6 3.6* 4.0  CL 102 105 103  CO2 21 20 22   GLUCOSE 114* 67* 228*  BUN 19 21 13   CREATININE 0.69 0.72 0.78  CALCIUM 9.6 8.5 8.6    CBG:  Recent Labs Lab 04/10/14 1143 04/10/14 1704 04/10/14 2031 04/10/14 2232 04/11/14 0752  GLUCAP 119* 79 132* 204* 126*    GFR Estimated Creatinine Clearance: 70 ml/min (by C-G formula based on Cr of 0.78).  Coagulation profile No results found for this basename: INR, PROTIME,  in the last 168 hours  Cardiac Enzymes No results found for this basename: CK, CKMB, TROPONINI, MYOGLOBIN,  in the last 168 hours  No components found with this basename: POCBNP,  No results found for this basename: DDIMER,  in the last 72 hours  Recent Labs  04/09/14 2015  HGBA1C 6.9*   No results found for this basename: CHOL, HDL, LDLCALC, TRIG, CHOLHDL, LDLDIRECT,  in the last 72 hours No results found for this basename: TSH,  T4TOTAL, FREET3, T3FREE, THYROIDAB,  in the last 72 hours  Recent Labs  04/09/14 2015  VITAMINB12 1387*  FOLATE 13.2  FERRITIN 58  TIBC 285  IRON 14*  RETICCTPCT 1.1   No results found for this basename: LIPASE, AMYLASE,  in the last 72 hours  Urine Studies No results found for this basename: UACOL, UAPR, USPG, UPH, UTP, UGL, UKET, UBIL, UHGB, UNIT, UROB, ULEU, UEPI, UWBC, URBC, UBAC, CAST, CRYS, UCOM, BILUA,  in the last 72  hours  MICROBIOLOGY: Recent Results (from the past 240 hour(s))  CULTURE, BLOOD (ROUTINE X 2)     Status: None   Collection Time    04/09/14  2:22 PM      Result Value Ref Range Status   Specimen Description BLOOD RIGHT HAND   Final   Special Requests BOTTLES DRAWN AEROBIC AND ANAEROBIC 3CC   Final   Culture  Setup Time     Final   Value: 04/09/2014 21:19     Performed at [facility]   Culture     Final   Value:        BLOOD CULTURE RECEIVED NO GROWTH TO DATE CULTURE WILL BE HELD FOR 5 DAYS BEFORE ISSUING A FINAL NEGATIVE REPORT     Performed at [facility]   Report Status PENDING   Incomplete  CULTURE, BLOOD (ROUTINE X 2)     Status: None   Collection Time    04/09/14  2:27 PM      Result Value Ref Range Status   Specimen Description BLOOD LEFT ARM   Final   Special Requests BOTTLES DRAWN AEROBIC AND ANAEROBIC 10CC   Final   Culture  Setup Time     Final   Value: 04/09/2014 21:19     Performed at [facility]   Culture     Final   Value: GRAM NEGATIVE RODS     YEAST     Note: Gram Stain Report Called to,Read Back By and Verified With: Cortland BUN 04/10/14 @ 10:15AM BY RUSCOE A.     Performed at [facility]   Report Status PENDING   Incomplete    RADIOLOGY STUDIES/RESULTS: Dg Chest Portable 1 View  04/09/2014   CLINICAL DATA:  Fever, pancreatitis, pancreatic cancer  EXAM: PORTABLE CHEST - 1 VIEW  COMPARISON:  12/31/2013  FINDINGS: Cardiomediastinal silhouette is stable. No acute infiltrate or pleural effusion. No  pulmonary edema. Mild degenerative changes thoracic spine.  IMPRESSION: No active disease.   Electronically Signed   By: Lahoma Crocker M.D.   On: 04/09/2014 15:52    Daiwik Buffalo Kristeen Mans, MD  [facility] Pager:[phone]  If 7PM-7AM, please contact night-coverage [email or web address] Password [email or web address] 04/11/2014, 11:41 AM   LOS: 2 days   **Disclaimer: This note may have been dictated with voice recognition software. Similar sounding words can inadvertently be transcribed and this note may contain transcription errors which may not have been corrected upon publication of note.**

## 2014-04-11 NOTE — Progress Notes (Signed)
INFECTIOUS DISEASE PROGRESS NOTE  ID: [name] [name] is a 58 y.o. female with  Principal Problem:   Septicemia Active Problems:   DM (diabetes mellitus)   HTN (hypertension)   Dyslipidemia   Pancreatic cancer   SIRS (systemic inflammatory response syndrome)   Anemia   Fungemia  Subjective: Resting quietly, awakens easily. No complaints.   Abtx:  Anti-infectives   Start     Dose/Rate Route Frequency Ordered Stop   04/10/14 1500  micafungin (MYCAMINE) 100 mg in sodium chloride 0.9 % 100 mL IVPB     100 mg 100 mL/hr over 1 Hours Intravenous Daily 04/10/14 1403     04/10/14 1200  fluconazole (DIFLUCAN) IVPB 400 mg  Status:  Discontinued     400 mg 100 mL/hr over 120 Minutes Intravenous Every 24 hours 04/10/14 1131 04/10/14 1403   04/10/14 0500  vancomycin (VANCOCIN) IVPB 750 mg/150 ml premix  Status:  Discontinued     750 mg 150 mL/hr over 60 Minutes Intravenous Every 12 hours 04/09/14 1751 04/10/14 1403   04/09/14 2200  piperacillin-tazobactam (ZOSYN) IVPB 3.375 g     3.375 g 12.5 mL/hr over 240 Minutes Intravenous 3 times per day 04/09/14 1839     04/09/14 1645  vancomycin (VANCOCIN) IVPB 1000 mg/200 mL premix     1,000 mg 200 mL/hr over 60 Minutes Intravenous  Once 04/09/14 1631 04/09/14 1830   04/09/14 1515  piperacillin-tazobactam (ZOSYN) IVPB 3.375 g     3.375 g 100 mL/hr over 30 Minutes Intravenous  Once 04/09/14 1509 04/09/14 1552      Medications:  Scheduled: . amLODipine  10 mg Oral Daily  . aspirin EC  81 mg Oral Daily  . enoxaparin (LOVENOX) injection  40 mg Subcutaneous Q24H  . gabapentin  300 mg Oral TID  . insulin aspart  0-9 Units Subcutaneous TID WC  . insulin glargine  20 Units Subcutaneous QHS  . lipase/protease/amylase  1 capsule Oral TID AC  . lisinopril  40 mg Oral Daily  . loratadine  10 mg Oral Daily  . metoCLOPramide  5 mg Oral TID AC  . metoprolol tartrate  25 mg Oral BID  . micafungin [facility]) IV  100 mg Intravenous Daily  .  pantoprazole  40 mg Oral Daily  . piperacillin-tazobactam (ZOSYN)  IV  3.375 g Intravenous 3 times per day  . potassium chloride  10 mEq Oral Daily  . simvastatin  10 mg Oral QHS    Objective: Vital signs in last 24 hours: Temp:  [98 F (36.7 C)-98.4 F (36.9 C)] 98 F (36.7 C) (05/25 0615) Pulse Rate:  [75-82] 75 (05/25 0615) Resp:  [20] 20 (05/25 0615) BP: (128-172)/(64-93) 128/74 mmHg (05/25 0615) SpO2:  [97 %-98 %] 97 % (05/25 0615)   General appearance: cooperative and no distress Resp: clear to auscultation bilaterally Cardio: regular rate and rhythm GI: normal findings: bowel sounds normal and soft, non-tender  Lab Results  Recent Labs  04/10/14 0730 04/11/14 0445  WBC 7.2 5.2  HGB 8.6* 8.7*  HCT 27.1* 27.0*  NA 137 134*  K 3.6* 4.0  CL 105 103  CO2 20 22  BUN 21 13  CREATININE 0.72 0.78   Liver Panel  Recent Labs  04/09/14 1456 04/11/14 0445  PROT 8.8* 6.9  ALBUMIN 3.4* 2.6*  AST 313* 105*  ALT 153* 86*  ALKPHOS 871* 554*  BILITOT 1.0 0.6   Sedimentation Rate No results found for this basename: ESRSEDRATE,  in the last 72  hours C-Reactive Protein No results found for this basename: CRP,  in the last 72 hours  Microbiology: Recent Results (from the past 240 hour(s))  CULTURE, BLOOD (ROUTINE X 2)     Status: None   Collection Time    04/09/14  2:22 PM      Result Value Ref Range Status   Specimen Description BLOOD RIGHT HAND   Final   Special Requests BOTTLES DRAWN AEROBIC AND ANAEROBIC 3CC   Final   Culture  Setup Time     Final   Value: 04/09/2014 21:19     Performed at [facility]   Culture     Final   Value:        BLOOD CULTURE RECEIVED NO GROWTH TO DATE CULTURE WILL BE HELD FOR 5 DAYS BEFORE ISSUING A FINAL NEGATIVE REPORT     Performed at [facility]   Report Status PENDING   Incomplete  CULTURE, BLOOD (ROUTINE X 2)     Status: None   Collection Time    04/09/14  2:27 PM      Result Value Ref Range Status    Specimen Description BLOOD LEFT ARM   Final   Special Requests BOTTLES DRAWN AEROBIC AND ANAEROBIC 10CC   Final   Culture  Setup Time     Final   Value: 04/09/2014 21:19     Performed at [facility]   Culture     Final   Value: GRAM NEGATIVE RODS     YEAST     Note: Gram Stain Report Called to,Read Back By and Verified With: LINDA BUN 04/10/14 @ 10:15AM BY RUSCOE A.     Performed at [facility]   Report Status PENDING   Incomplete    Studies/Results: Dg Chest Portable 1 View  04/09/2014   CLINICAL DATA:  Fever, pancreatitis, pancreatic cancer  EXAM: PORTABLE CHEST - 1 VIEW  COMPARISON:  12/31/2013  FINDINGS: Cardiomediastinal silhouette is stable. No acute infiltrate or pleural effusion. No pulmonary edema. Mild degenerative changes thoracic spine.  IMPRESSION: No active disease.   Electronically Signed   By: Lahoma Crocker M.D.   On: 04/09/2014 15:52     Assessment/Plan: Fungemia  Bacteremia  UTI  Pancreatic CA  Total days of antibiotics: 2 mycafungin, 3 zosyn  No change in anbx Await speciation Await Abd/pelvis CT echo         [facility] (pager) [phone] [email or web address] 04/11/2014, 2:21 PM  LOS: 2 days   **Disclaimer: This note may have been dictated with voice recognition software. Similar sounding words can inadvertently be transcribed and this note may contain transcription errors which may not have been corrected upon publication of note.**

## 2014-04-12 ENCOUNTER — Ambulatory Visit: Payer: Medicaid Other | Admitting: Cardiology

## 2014-04-12 LAB — GLUCOSE, CAPILLARY
GLUCOSE-CAPILLARY: 290 mg/dL — AB (ref 70–99)
GLUCOSE-CAPILLARY: 216 mg/dL — AB (ref 70–99)
Glucose-Capillary: 90 mg/dL (ref 70–99)
Glucose-Capillary: 251 mg/dL — ABNORMAL HIGH (ref 70–99)

## 2014-04-12 MED ORDER — DEXTROSE 5 % IV SOLN
1.0000 g | INTRAVENOUS | Status: DC
  Administered 2014-04-12 – 2014-04-14 (×3): 1 g via INTRAVENOUS
  Filled 2014-04-12 (×3): qty 10

## 2014-04-12 NOTE — Progress Notes (Signed)
Inpatient Diabetes Program Recommendations  AACE/ADA: New Consensus Statement on Inpatient Glycemic Control (2013)  Target Ranges:  Prepandial:   less than 140 mg/dL      Peak postprandial:   less than 180 mg/dL (1-2 hours)      Critically ill patients:  140 - 180 mg/dL   Results for MARYA, LOWDEN (MRN [id number]) as of 04/12/2014 10:34  Ref. Range 04/11/2014 07:52 04/11/2014 12:00 04/11/2014 17:09 04/11/2014 22:01 04/12/2014 07:54  Glucose-Capillary Latest Range: 70-99 mg/dL 126 (H) 210 (H) 273 (H) 147 (H) 90   Diabetes history: DM Outpatient Diabetes medications: Lantus 30 units QHS, Novolog 10 units TID with meals Current orders for Inpatient glycemic control:Lantus 20 units QHS, Novolog 0-9 units AC  Inpatient Diabetes Program Recommendations Insulin - Meal Coverage: Please consider ordering Novolog 4 units TID with meals for meal coverage (in addition to correction scale) if patient is eating at least 50% of meals.  Thanks, Barnie Alderman, RN, MSN, CCRN Diabetes Coordinator Inpatient Diabetes Program [phone] (Team Pager) [phone] ([place] office) [phone] [facility] office)

## 2014-04-12 NOTE — Progress Notes (Signed)
PATIENT DETAILS Name: [name] [name] Age: 58 y.o. Sex: female Date of Birth: 04-07-1956 Admit Date: 04/09/2014 Admitting Physician Cristal Ford, DO FIE:PPIRJJO,ACZYS A, MD  Subjective: No major complaints.Remains Afebrile.  Assessment/Plan: Principal Problem:   Sepsis -secondary to gram neg bacteremia and fungemia -sepsis pathophysiology has resolved with IVF, IV Abx and Antifungals  Active Problems: Gram-negative bacteremia and fungemia -on empiric Zosyn (started 5/23) and Micafungin (started on 5/24) -awaiting final blood culture results, ID following. Improved clinically-afebrile -?2/2 GI tract portal of entry-has a CBD stent in place, recent CT Abd/pelvis on 4/20 reveiwed. -Interestingly in Feb 2015-had viridans bacteremia-underwent TEE (negative) and was discharged home with PICC line Rocephin    DM (diabetes mellitus) -CBG's stable-continue with Lantus 20 units and SSI for now-follow CBG's and adjust accordingly    HTN (hypertension) -controlled, c/w Amlodipine,Lisinopril and Metoprolol    Dyslipidemia -c/w Statins    Anemia -consistent with anemia of chronic disease -follow Hb    Pancreatic cancer -Adenocarcinoma of the pancreas presenting with an obstructing pancreas head mass, clinical stage II versus III (T3-T4, N1)-previously treated Xeloda and radiation.Reveiwed Oncology notes-plans were to be treated with a first cycle of gemcitabine/Abraxane on 04/15/2014.Spoke with Dr [name] [name] will see patient in am-5/27-he thinks patient is a poor candidate for further chemotherapy-given her lack of understanding of her disease. May need palliative care involved at some point, await Oncology eval.  Chronic Elevated LFT's -likely secondary to above, follow periodically -has a CBD stent in place  Chronic Portal Vein Thrombosis -spoke with Dr [name] [name], this is chronic and from direct invasion of the tumor,patient was previously on Lovenox-which she refused to take.  Oncology does not advise any further anticoagulation at this point.  Disposition: Remain inpatient  DVT Prophylaxis: Prophylactic Lovenox   Code Status: Full code  Family Communication Daughter--House,[name] [name]-over the phone  Procedures:  None  CONSULTS:  ID  Time spent 40 minutes-which includes 50% of the time with face-to-face with patient/ family and coordinating care related to the above assessment and plan.  MEDICATIONS: Scheduled Meds: . amLODipine  10 mg Oral Daily  . aspirin EC  81 mg Oral Daily  . enoxaparin (LOVENOX) injection  40 mg Subcutaneous Q24H  . gabapentin  300 mg Oral TID  . insulin aspart  0-9 Units Subcutaneous TID WC  . insulin glargine  20 Units Subcutaneous QHS  . lipase/protease/amylase  1 capsule Oral TID AC  . lisinopril  40 mg Oral Daily  . loratadine  10 mg Oral Daily  . metoCLOPramide  5 mg Oral TID AC  . metoprolol tartrate  25 mg Oral BID  . micafungin [facility]) IV  100 mg Intravenous Daily  . pantoprazole  40 mg Oral Daily  . piperacillin-tazobactam (ZOSYN)  IV  3.375 g Intravenous 3 times per day  . potassium chloride  10 mEq Oral Daily  . simvastatin  10 mg Oral QHS   Continuous Infusions:  PRN Meds:.acetaminophen, acetaminophen, docusate sodium, morphine injection, ondansetron, oxyCODONE, polyethylene glycol, simethicone  Antibiotics: Anti-infectives   Start     Dose/Rate Route Frequency Ordered Stop   04/10/14 1500  micafungin (MYCAMINE) 100 mg in sodium chloride 0.9 % 100 mL IVPB     100 mg 100 mL/hr over 1 Hours Intravenous Daily 04/10/14 1403     04/10/14 1200  fluconazole (DIFLUCAN) IVPB 400 mg  Status:  Discontinued     400 mg 100 mL/hr over 120 Minutes Intravenous Every 24 hours 04/10/14 1131 04/10/14  1403   04/10/14 0500  vancomycin (VANCOCIN) IVPB 750 mg/150 ml premix  Status:  Discontinued     750 mg 150 mL/hr over 60 Minutes Intravenous Every 12 hours 04/09/14 1751 04/10/14 1403   04/09/14 2200   piperacillin-tazobactam (ZOSYN) IVPB 3.375 g     3.375 g 12.5 mL/hr over 240 Minutes Intravenous 3 times per day 04/09/14 1839     04/09/14 1645  vancomycin (VANCOCIN) IVPB 1000 mg/200 mL premix     1,000 mg 200 mL/hr over 60 Minutes Intravenous  Once 04/09/14 1631 04/09/14 1830   04/09/14 1515  piperacillin-tazobactam (ZOSYN) IVPB 3.375 g     3.375 g 100 mL/hr over 30 Minutes Intravenous  Once 04/09/14 1509 04/09/14 1552       PHYSICAL EXAM: Vital signs in last 24 hours: Filed Vitals:   04/11/14 0615 04/11/14 1451 04/11/14 1940 04/12/14 0446  BP: 128/74 122/74 138/68 134/75  Pulse: 75 64 71 67  Temp: 98 F (36.7 C) 98.8 F (37.1 C) 99.1 F (37.3 C) 98.6 F (37 C)  TempSrc: Oral Oral Oral Oral  Resp: 20 18 15 12   Height:      Weight:      SpO2: 97% 97% 100% 97%    Weight change:  Filed Weights   04/09/14 1800 04/09/14 1853  Weight: 62.37 kg (137 lb 8 oz) 64.229 kg (141 lb 9.6 oz)   Body mass index is 22.87 kg/(m^2).   Gen Exam: Awake and alert with clear speech.  Hard of hearing Neck: Supple, No JVD.   Chest: B/L Clear.   CVS: S1 S2 Regular, no murmurs.  Abdomen: soft, BS +, mildly tender in periumblical area, non distended.  Extremities: no edema, lower extremities warm to touch. Neurologic: Non Focal.   Skin: No Rash.   Wounds: N/A.   Intake/Output from previous day:  Intake/Output Summary (Last 24 hours) at 04/12/14 1048 Last data filed at 04/11/14 2259  Gross per 24 hour  Intake    440 ml  Output      0 ml  Net    440 ml     LAB RESULTS: CBC  Recent Labs Lab 04/09/14 1456 04/10/14 0730 04/11/14 0445  WBC 11.1* 7.2 5.2  HGB 7.4* 8.6* 8.7*  HCT 22.7* 27.1* 27.0*  PLT 251 153 170  MCV 81.9 82.6 82.1  MCH 26.7 26.2 26.4  MCHC 32.6 31.7 32.2  RDW 17.0* 17.1* 17.2*  LYMPHSABS 0.3*  --   --   MONOABS 0.7  --   --   EOSABS 0.1  --   --   BASOSABS 0.0  --   --     Chemistries   Recent Labs Lab 04/09/14 1456 04/10/14 0730 04/11/14 0445    NA 140 137 134*  K 4.6 3.6* 4.0  CL 102 105 103  CO2 21 20 22   GLUCOSE 114* 67* 228*  BUN 19 21 13   CREATININE 0.69 0.72 0.78  CALCIUM 9.6 8.5 8.6    CBG:  Recent Labs Lab 04/11/14 0752 04/11/14 1200 04/11/14 1709 04/11/14 2201 04/12/14 0754  GLUCAP 126* 210* 273* 147* 90    GFR Estimated Creatinine Clearance: 70 ml/min (by C-G formula based on Cr of 0.78).  Coagulation profile No results found for this basename: INR, PROTIME,  in the last 168 hours  Cardiac Enzymes No results found for this basename: CK, CKMB, TROPONINI, MYOGLOBIN,  in the last 168 hours  No components found with this basename: POCBNP,  No results found  for this basename: DDIMER,  in the last 72 hours  Recent Labs  04/09/14 2015  HGBA1C 6.9*   No results found for this basename: CHOL, HDL, LDLCALC, TRIG, CHOLHDL, LDLDIRECT,  in the last 72 hours No results found for this basename: TSH, T4TOTAL, FREET3, T3FREE, THYROIDAB,  in the last 72 hours  Recent Labs  04/09/14 2015  VITAMINB12 1387*  FOLATE 13.2  FERRITIN 58  TIBC 285  IRON 14*  RETICCTPCT 1.1   No results found for this basename: LIPASE, AMYLASE,  in the last 72 hours  Urine Studies No results found for this basename: UACOL, UAPR, USPG, UPH, UTP, UGL, UKET, UBIL, UHGB, UNIT, UROB, ULEU, UEPI, UWBC, URBC, UBAC, CAST, CRYS, UCOM, BILUA,  in the last 72 hours  MICROBIOLOGY: Recent Results (from the past 240 hour(s))  CULTURE, BLOOD (ROUTINE X 2)     Status: None   Collection Time    04/09/14  2:22 PM      Result Value Ref Range Status   Specimen Description BLOOD RIGHT HAND   Final   Special Requests BOTTLES DRAWN AEROBIC AND ANAEROBIC 3CC   Final   Culture  Setup Time     Final   Value: 04/09/2014 21:19     Performed at [facility]   Culture     Final   Value:        BLOOD CULTURE RECEIVED NO GROWTH TO DATE CULTURE WILL BE HELD FOR 5 DAYS BEFORE ISSUING A FINAL NEGATIVE REPORT     Performed at [facility]    Report Status PENDING   Incomplete  CULTURE, BLOOD (ROUTINE X 2)     Status: None   Collection Time    04/09/14  2:27 PM      Result Value Ref Range Status   Specimen Description BLOOD LEFT ARM   Final   Special Requests BOTTLES DRAWN AEROBIC AND ANAEROBIC 10CC   Final   Culture  Setup Time     Final   Value: 04/09/2014 21:19     Performed at [facility]   Culture     Final   Value: GRAM NEGATIVE RODS     YEAST     Note: Gram Stain Report Called to,Read Back By and Verified With: LINDA BUN 04/10/14 @ 10:15AM BY RUSCOE A.     Performed at [facility]   Report Status PENDING   Incomplete  URINE CULTURE     Status: None   Collection Time    04/10/14  8:13 AM      Result Value Ref Range Status   Specimen Description URINE, CLEAN CATCH   Final   Special Requests NONE   Final   Culture  Setup Time     Final   Value: 04/10/2014 19:15     Performed at [place]     Final   Value: NO GROWTH     Performed at [facility]   Culture     Final   Value: NO GROWTH     Performed at [facility]   Report Status 04/11/2014 FINAL   Final    RADIOLOGY STUDIES/RESULTS: Dg Chest Portable 1 View  04/09/2014   CLINICAL DATA:  Fever, pancreatitis, pancreatic cancer  EXAM: PORTABLE CHEST - 1 VIEW  COMPARISON:  12/31/2013  FINDINGS: Cardiomediastinal silhouette is stable. No acute infiltrate or pleural effusion. No pulmonary edema. Mild degenerative changes thoracic spine.  IMPRESSION: No active  disease.   Electronically Signed   By: Lahoma Crocker M.D.   On: 04/09/2014 15:52    Davaris Youtsey Kristeen Mans, MD  [facility] Pager:[phone]  If 7PM-7AM, please contact night-coverage [email or web address] Password [email or web address] 04/12/2014, 10:48 AM   LOS: 3 days   **Disclaimer: This note may have been dictated with voice recognition software. Similar sounding words can inadvertently be transcribed and this note may contain transcription errors which may not  have been corrected upon publication of note.**

## 2014-04-12 NOTE — Progress Notes (Signed)
INFECTIOUS DISEASE PROGRESS NOTE  ID: [name] [name] is a 58 y.o. female with  Principal Problem:   Septicemia Active Problems:   DM (diabetes mellitus)   HTN (hypertension)   Dyslipidemia   Pancreatic cancer   SIRS (systemic inflammatory response syndrome)   Anemia   Fungemia  Subjective: Without complaints. Would like to ambulate.   Abtx:  Anti-infectives   Start     Dose/Rate Route Frequency Ordered Stop   04/10/14 1500  micafungin (MYCAMINE) 100 mg in sodium chloride 0.9 % 100 mL IVPB     100 mg 100 mL/hr over 1 Hours Intravenous Daily 04/10/14 1403     04/10/14 1200  fluconazole (DIFLUCAN) IVPB 400 mg  Status:  Discontinued     400 mg 100 mL/hr over 120 Minutes Intravenous Every 24 hours 04/10/14 1131 04/10/14 1403   04/10/14 0500  vancomycin (VANCOCIN) IVPB 750 mg/150 ml premix  Status:  Discontinued     750 mg 150 mL/hr over 60 Minutes Intravenous Every 12 hours 04/09/14 1751 04/10/14 1403   04/09/14 2200  piperacillin-tazobactam (ZOSYN) IVPB 3.375 g     3.375 g 12.5 mL/hr over 240 Minutes Intravenous 3 times per day 04/09/14 1839     04/09/14 1645  vancomycin (VANCOCIN) IVPB 1000 mg/200 mL premix     1,000 mg 200 mL/hr over 60 Minutes Intravenous  Once 04/09/14 1631 04/09/14 1830   04/09/14 1515  piperacillin-tazobactam (ZOSYN) IVPB 3.375 g     3.375 g 100 mL/hr over 30 Minutes Intravenous  Once 04/09/14 1509 04/09/14 1552      Medications:  Scheduled: . amLODipine  10 mg Oral Daily  . aspirin EC  81 mg Oral Daily  . enoxaparin (LOVENOX) injection  40 mg Subcutaneous Q24H  . gabapentin  300 mg Oral TID  . insulin aspart  0-9 Units Subcutaneous TID WC  . insulin glargine  20 Units Subcutaneous QHS  . lipase/protease/amylase  1 capsule Oral TID AC  . lisinopril  40 mg Oral Daily  . loratadine  10 mg Oral Daily  . metoCLOPramide  5 mg Oral TID AC  . metoprolol tartrate  25 mg Oral BID  . micafungin [facility]) IV  100 mg Intravenous Daily  . pantoprazole   40 mg Oral Daily  . piperacillin-tazobactam (ZOSYN)  IV  3.375 g Intravenous 3 times per day  . potassium chloride  10 mEq Oral Daily  . simvastatin  10 mg Oral QHS    Objective: Vital signs in last 24 hours: Temp:  [98.6 F (37 C)-99.1 F (37.3 C)] 98.6 F (37 C) (05/26 0446) Pulse Rate:  [64-71] 67 (05/26 0446) Resp:  [12-18] 12 (05/26 0446) BP: (122-138)/(68-75) 134/75 mmHg (05/26 0446) SpO2:  [97 %-100 %] 97 % (05/26 0446)   General appearance: alert, cooperative and no distress Resp: clear to auscultation bilaterally Cardio: regular rate and rhythm GI: normal findings: bowel sounds normal and soft, non-tender  Lab Results  Recent Labs  04/10/14 0730 04/11/14 0445  WBC 7.2 5.2  HGB 8.6* 8.7*  HCT 27.1* 27.0*  NA 137 134*  K 3.6* 4.0  CL 105 103  CO2 20 22  BUN 21 13  CREATININE 0.72 0.78   Liver Panel  Recent Labs  04/09/14 1456 04/11/14 0445  PROT 8.8* 6.9  ALBUMIN 3.4* 2.6*  AST 313* 105*  ALT 153* 86*  ALKPHOS 871* 554*  BILITOT 1.0 0.6   Sedimentation Rate No results found for this basename: ESRSEDRATE,  in the last  72 hours C-Reactive Protein No results found for this basename: CRP,  in the last 72 hours  Microbiology: Recent Results (from the past 240 hour(s))  CULTURE, BLOOD (ROUTINE X 2)     Status: None   Collection Time    04/09/14  2:22 PM      Result Value Ref Range Status   Specimen Description BLOOD RIGHT HAND   Final   Special Requests BOTTLES DRAWN AEROBIC AND ANAEROBIC 3CC   Final   Culture  Setup Time     Final   Value: 04/09/2014 21:19     Performed at [facility]   Culture     Final   Value:        BLOOD CULTURE RECEIVED NO GROWTH TO DATE CULTURE WILL BE HELD FOR 5 DAYS BEFORE ISSUING A FINAL NEGATIVE REPORT     Performed at [facility]   Report Status PENDING   Incomplete  CULTURE, BLOOD (ROUTINE X 2)     Status: None   Collection Time    04/09/14  2:27 PM      Result Value Ref Range Status    Specimen Description BLOOD LEFT ARM   Final   Special Requests BOTTLES DRAWN AEROBIC AND ANAEROBIC 10CC   Final   Culture  Setup Time     Final   Value: 04/09/2014 21:19     Performed at [facility]   Culture     Final   Value: GRAM NEGATIVE RODS     YEAST     Note: Gram Stain Report Called to,Read Back By and Verified With: LINDA BUN 04/10/14 @ 10:15AM BY RUSCOE A.     Performed at [facility]   Report Status PENDING   Incomplete  URINE CULTURE     Status: None   Collection Time    04/10/14  8:13 AM      Result Value Ref Range Status   Specimen Description URINE, CLEAN CATCH   Final   Special Requests NONE   Final   Culture  Setup Time     Final   Value: 04/10/2014 19:15     Performed at [place]     Final   Value: NO GROWTH     Performed at [facility]   Culture     Final   Value: NO GROWTH     Performed at [facility]   Report Status 04/11/2014 FINAL   Final    Studies/Results: No results found.   Assessment/Plan: Fungemia  Bacteremia  UTI  Pancreatic CA  Total days of antibiotics:  3 mycafungin, 4 zosyn  Could consider tee Awaiting ID of yeast- not growing on plates. Will replate. Contaminant on 1 section only of blood slide? Awaiting sensi of E coli, should be out in AM Will change zosyn to ceftriaxone onc eval in AM, possible palliative care eval I explained pt's bacteremia/fungemia to her.          [facility] (pager) [phone] [email or web address] 04/12/2014, 11:28 AM  LOS: 3 days   **Disclaimer: This note may have been dictated with voice recognition software. Similar sounding words can inadvertently be transcribed and this note may contain transcription errors which may not have been corrected upon publication of note.**

## 2014-04-13 ENCOUNTER — Other Ambulatory Visit: Payer: Medicaid Other

## 2014-04-13 DIAGNOSIS — R509 Fever, unspecified: Secondary | ICD-10-CM

## 2014-04-13 DIAGNOSIS — B9689 Other specified bacterial agents as the cause of diseases classified elsewhere: Secondary | ICD-10-CM

## 2014-04-13 LAB — CULTURE, BLOOD (ROUTINE X 2)

## 2014-04-13 LAB — COMPREHENSIVE METABOLIC PANEL
ALBUMIN: 2.9 g/dL — AB (ref 3.5–5.2)
ALT: 67 U/L — ABNORMAL HIGH (ref 0–35)
AST: 64 U/L — ABNORMAL HIGH (ref 0–37)
Alkaline Phosphatase: 660 U/L — ABNORMAL HIGH (ref 39–117)
BUN: 13 mg/dL (ref 6–23)
CALCIUM: 9.5 mg/dL (ref 8.4–10.5)
CO2: 26 mEq/L (ref 19–32)
CREATININE: 0.67 mg/dL (ref 0.50–1.10)
Chloride: 101 mEq/L (ref 96–112)
GFR calc non Af Amer: >90 mL/min (ref >90)
GLUCOSE: 79 mg/dL (ref 70–99)
Potassium: 4 mEq/L (ref 3.7–5.3)
Sodium: 137 mEq/L (ref 137–147)
TOTAL PROTEIN: 7.7 g/dL (ref 6.0–8.3)
Total Bilirubin: 0.6 mg/dL (ref 0.3–1.2)

## 2014-04-13 LAB — CBC
HEMATOCRIT: 29.5 % — AB (ref 36.0–46.0)
HEMOGLOBIN: 9.6 g/dL — AB (ref 12.0–15.0)
MCH: 26.2 pg (ref 26.0–34.0)
MCHC: 32.5 g/dL (ref 30.0–36.0)
MCV: 80.6 fL (ref 78.0–100.0)
Platelets: 202 10*3/uL (ref 150–400)
RBC: 3.66 MIL/uL — ABNORMAL LOW (ref 3.87–5.11)
RDW: 16.5 % — AB (ref 11.5–15.5)
WBC: 5 10*3/uL (ref 4.0–10.5)

## 2014-04-13 LAB — GLUCOSE, CAPILLARY
GLUCOSE-CAPILLARY: 76 mg/dL (ref 70–99)
GLUCOSE-CAPILLARY: 201 mg/dL — AB (ref 70–99)
GLUCOSE-CAPILLARY: 244 mg/dL — AB (ref 70–99)
GLUCOSE-CAPILLARY: 249 mg/dL — AB (ref 70–99)

## 2014-04-13 MED ORDER — SODIUM CHLORIDE 0.9 % IJ SOLN: 10.0000 mL | INTRAMUSCULAR | Status: DC | PRN

## 2014-04-13 MED ORDER — LORAZEPAM 2 MG/ML IJ SOLN
1.0000 mg | Freq: Once | INTRAMUSCULAR | Status: AC
  Administered 2014-04-13: 1 mg via INTRAVENOUS
  Filled 2014-04-13: qty 1

## 2014-04-13 MED ORDER — SODIUM CHLORIDE 0.9 % IV SOLN: 100.0000 mg | Freq: Every day | INTRAVENOUS | Status: AC

## 2014-04-13 MED ORDER — DEXTROSE 5 % IV SOLN: 1.0000 g | INTRAVENOUS | Status: AC

## 2014-04-13 NOTE — Progress Notes (Signed)
PATIENT DETAILS Name: [name] [name] Age: 58 y.o. Sex: female Date of Birth: Oct 10, 1956 Admit Date: 04/09/2014 Admitting Physician Cristal Ford, DO QBH:[id number],WIOXB A, MD   Subjective:  In bed no fevers, no headache, no chest abdominal pain. No shortness of breath or focal weakness.    Assessment/Plan:      Sepsis with Gram-negative bacteremia and fungemia -on empiric Zosyn (started 5/23) and Micafungin (started on 5/24), being followed by ID. Likely will need a PICC line. We'll defer antibiotic duration and dose to ID.  -?2/2 GI tract portal of entry-has a CBD stent in place, recent CT Abd/pelvis on 4/20 reveiwed.  -Interestingly in Feb 2015-had viridans bacteremia-underwent TEE (negative) and was discharged home with PICC line Rocephin      DM (diabetes mellitus) -CBG's stable-continue with Lantus 20 units and SSI for now-follow CBG's and adjust accordingly  CBG (last 3)   Recent Labs  04/12/14 1731 04/12/14 2116 04/13/14 0810  GLUCAP 251* 216* 76        HTN (hypertension) -controlled, c/w Amlodipine,Lisinopril and Metoprolol      Dyslipidemia -c/w Statins      Anemia -consistent with anemia of chronic disease -follow Hb      Pancreatic cancer -Adenocarcinoma of the pancreas presenting with an obstructing pancreas head mass, clinical stage II versus III (T3-T4, N1)-previously treated Xeloda and radiation.Reveiwed Oncology notes-plans were to be treated with a first cycle of gemcitabine/Abraxane on 04/15/2014. Seen by Dr. Malachy Mood oncologist await his final note, previously according to him patient is a poor candidate for further chemotherapy-given her lack of understanding of her disease.  May need palliative care involved at some point, await Oncology note.     Chronic Elevated LFT's -likely secondary to above, follow periodically -has a CBD stent in place    Chronic Portal Vein Thrombosis -spoke with Dr Benay Spice, this is chronic and  from direct invasion of the tumor,patient was previously on Lovenox-which she refused to take. Oncology does not advise any further anticoagulation at this point.     Disposition: Remain inpatient  DVT Prophylaxis: Prophylactic Lovenox   Code Status: Full code  Family Communication Daughter--House,[name]-over the phone  Procedures:  None  CONSULTS:  ID  Time spent 40 minutes-which includes 50% of the time with face-to-face with patient/ family and coordinating care related to the above assessment and plan.  MEDICATIONS: Scheduled Meds: . amLODipine  10 mg Oral Daily  . aspirin EC  81 mg Oral Daily  . cefTRIAXone (ROCEPHIN)  IV  1 g Intravenous Q24H  . enoxaparin (LOVENOX) injection  40 mg Subcutaneous Q24H  . gabapentin  300 mg Oral TID  . insulin aspart  0-9 Units Subcutaneous TID WC  . insulin glargine  20 Units Subcutaneous QHS  . lipase/protease/amylase  1 capsule Oral TID AC  . lisinopril  40 mg Oral Daily  . loratadine  10 mg Oral Daily  . metoCLOPramide  5 mg Oral TID AC  . metoprolol tartrate  25 mg Oral BID  . micafungin [facility]) IV  100 mg Intravenous Daily  . pantoprazole  40 mg Oral Daily  . potassium chloride  10 mEq Oral Daily  . simvastatin  10 mg Oral QHS   Continuous Infusions:  PRN Meds:.acetaminophen, acetaminophen, docusate sodium, morphine injection, ondansetron, oxyCODONE, polyethylene glycol, simethicone  Antibiotics: Anti-infectives   Start     Dose/Rate Route Frequency Ordered Stop   04/12/14 1200  cefTRIAXone (ROCEPHIN) 1 g in dextrose 5 % 50  mL IVPB     1 g 100 mL/hr over 30 Minutes Intravenous Every 24 hours 04/12/14 1142     04/10/14 1500  micafungin (MYCAMINE) 100 mg in sodium chloride 0.9 % 100 mL IVPB     100 mg 100 mL/hr over 1 Hours Intravenous Daily 04/10/14 1403     04/10/14 1200  fluconazole (DIFLUCAN) IVPB 400 mg  Status:  Discontinued     400 mg 100 mL/hr over 120 Minutes Intravenous Every 24 hours 04/10/14 1131  04/10/14 1403   04/10/14 0500  vancomycin (VANCOCIN) IVPB 750 mg/150 ml premix  Status:  Discontinued     750 mg 150 mL/hr over 60 Minutes Intravenous Every 12 hours 04/09/14 1751 04/10/14 1403   04/09/14 2200  piperacillin-tazobactam (ZOSYN) IVPB 3.375 g  Status:  Discontinued     3.375 g 12.5 mL/hr over 240 Minutes Intravenous 3 times per day 04/09/14 1839 04/12/14 1142   04/09/14 1645  vancomycin (VANCOCIN) IVPB 1000 mg/200 mL premix     1,000 mg 200 mL/hr over 60 Minutes Intravenous  Once 04/09/14 1631 04/09/14 1830   04/09/14 1515  piperacillin-tazobactam (ZOSYN) IVPB 3.375 g     3.375 g 100 mL/hr over 30 Minutes Intravenous  Once 04/09/14 1509 04/09/14 1552       PHYSICAL EXAM: Vital signs in last 24 hours: Filed Vitals:   04/12/14 0446 04/12/14 1413 04/12/14 2117 04/13/14 0436  BP: 134/75 146/74 152/75 115/71  Pulse: 67 62 73 54  Temp: 98.6 F (37 C) 98.3 F (36.8 C) 99.4 F (37.4 C) 98.3 F (36.8 C)  TempSrc: Oral Oral Oral Oral  Resp: 12 16 18 18   Height:      Weight:      SpO2: 97% 99% 98% 98%    Weight change:  Filed Weights   04/09/14 1800 04/09/14 1853  Weight: 62.37 kg (137 lb 8 oz) 64.229 kg (141 lb 9.6 oz)   Body mass index is 22.87 kg/(m^2).   Gen Exam: Awake and alert with clear speech.  Hard of hearing but communicates after reading he movement Neck: Supple, No JVD.   Chest: B/L Clear.   CVS: S1 S2 Regular, no murmurs.  Abdomen: soft, BS +, mildly tender in periumblical area, non distended.  Extremities: no edema, lower extremities warm to touch. Neurologic: Non Focal.   Skin: No Rash.   Wounds: N/A.   Intake/Output from previous day: No intake or output data in the 24 hours ending 04/13/14 1154   LAB RESULTS: CBC  Recent Labs Lab 04/09/14 1456 04/10/14 0730 04/11/14 0445 04/13/14 0705  WBC 11.1* 7.2 5.2 5.0  HGB 7.4* 8.6* 8.7* 9.6*  HCT 22.7* 27.1* 27.0* 29.5*  PLT 251 153 170 202  MCV 81.9 82.6 82.1 80.6  MCH 26.7 26.2 26.4  26.2  MCHC 32.6 31.7 32.2 32.5  RDW 17.0* 17.1* 17.2* 16.5*  LYMPHSABS 0.3*  --   --   --   MONOABS 0.7  --   --   --   EOSABS 0.1  --   --   --   BASOSABS 0.0  --   --   --     Chemistries   Recent Labs Lab 04/09/14 1456 04/10/14 0730 04/11/14 0445 04/13/14 0705  NA 140 137 134* 137  K 4.6 3.6* 4.0 4.0  CL 102 105 103 101  CO2 21 20 22 26   GLUCOSE 114* 67* 228* 79  BUN 19 21 13 13   CREATININE 0.69 0.72 0.78 0.67  CALCIUM 9.6 8.5 8.6 9.5    CBG:  Recent Labs Lab 04/12/14 0754 04/12/14 1214 04/12/14 1731 04/12/14 2116 04/13/14 0810  GLUCAP 90 290* 251* 216* 76    GFR Estimated Creatinine Clearance: 70 ml/min (by C-G formula based on Cr of 0.67).  Coagulation profile No results found for this basename: INR, PROTIME,  in the last 168 hours  Cardiac Enzymes No results found for this basename: CK, CKMB, TROPONINI, MYOGLOBIN,  in the last 168 hours  No components found with this basename: POCBNP,  No results found for this basename: DDIMER,  in the last 72 hours No results found for this basename: HGBA1C,  in the last 72 hours No results found for this basename: CHOL, HDL, LDLCALC, TRIG, CHOLHDL, LDLDIRECT,  in the last 72 hours No results found for this basename: TSH, T4TOTAL, FREET3, T3FREE, THYROIDAB,  in the last 72 hours No results found for this basename: VITAMINB12, FOLATE, FERRITIN, TIBC, IRON, RETICCTPCT,  in the last 72 hours No results found for this basename: LIPASE, AMYLASE,  in the last 72 hours  Urine Studies No results found for this basename: UACOL, UAPR, USPG, UPH, UTP, UGL, UKET, UBIL, UHGB, UNIT, UROB, ULEU, UEPI, UWBC, URBC, UBAC, CAST, CRYS, UCOM, BILUA,  in the last 72 hours  MICROBIOLOGY: Recent Results (from the past 240 hour(s))  CULTURE, BLOOD (ROUTINE X 2)     Status: None   Collection Time    04/09/14  2:22 PM      Result Value Ref Range Status   Specimen Description BLOOD RIGHT HAND   Final   Special Requests BOTTLES DRAWN  AEROBIC AND ANAEROBIC 3CC   Final   Culture  Setup Time     Final   Value: 04/09/2014 21:19     Performed at [facility]   Culture     Final   Value:        BLOOD CULTURE RECEIVED NO GROWTH TO DATE CULTURE WILL BE HELD FOR 5 DAYS BEFORE ISSUING A FINAL NEGATIVE REPORT     Performed at [facility]   Report Status PENDING   Incomplete  CULTURE, BLOOD (ROUTINE X 2)     Status: None   Collection Time    04/09/14  2:27 PM      Result Value Ref Range Status   Specimen Description BLOOD LEFT ARM   Final   Special Requests BOTTLES DRAWN AEROBIC AND ANAEROBIC 10CC   Final   Culture  Setup Time     Final   Value: 04/09/2014 21:19     Performed at [facility]   Culture     Final   Value: ESCHERICHIA COLI     Note: CORRECTED RESULTS CALLED TO: DR HATCHER [id number] BY DANTS     Note: Gram Stain Report Called to,Read Back By and Verified With: LINDA BUN 04/10/14 @ 10:15AM BY RUSCOE A. PREVIOUS REPORT=YEAST     Performed at [facility]   Report Status 04/13/2014 FINAL   Final   Organism ID, Bacteria ESCHERICHIA COLI   Final  URINE CULTURE     Status: None   Collection Time    04/10/14  8:13 AM      Result Value Ref Range Status   Specimen Description URINE, CLEAN CATCH   Final   Special Requests NONE   Final   Culture  Setup Time     Final   Value: 04/10/2014 19:15     Performed at [facility]  Colony Count     Final   Value: NO GROWTH     Performed at [facility]   Culture     Final   Value: NO GROWTH     Performed at [facility]   Report Status 04/11/2014 FINAL   Final    RADIOLOGY STUDIES/RESULTS: Dg Chest Portable 1 View  04/09/2014   CLINICAL DATA:  Fever, pancreatitis, pancreatic cancer  EXAM: PORTABLE CHEST - 1 VIEW  COMPARISON:  12/31/2013  FINDINGS: Cardiomediastinal silhouette is stable. No acute infiltrate or pleural effusion. No pulmonary edema. Mild degenerative changes thoracic spine.  IMPRESSION: No active  disease.   Electronically Signed   By: Lahoma Crocker M.D.   On: 04/09/2014 15:52   Thurnell Lose M.D on 04/13/2014 at 11:54 AM  Between 7am to 7pm - Pager - [phone], After 7pm go to [email or web address] - password [email or web address]  And look for the night coverage person covering me after hours  [place]   LOS: 4 days   **Disclaimer: This note may have been dictated with voice recognition software. Similar sounding words can inadvertently be transcribed and this note may contain transcription errors which may not have been corrected upon publication of note.**

## 2014-04-13 NOTE — Progress Notes (Signed)
IP PROGRESS NOTE  Subjective:   [name] [name] is well-known to me with a history of pancreas cancer. She was admitted with a fever and chills 04/09/2014. A blood culture has returned positive for gram-negative rods and "yeast ". She reports feeling better. She continues to have upper abdominal pain relieved with narcotics. She complains of pruritus.  Objective: Vital signs in last 24 hours: Blood pressure 115/71, pulse 54, temperature 98.3 F (36.8 C), temperature source Oral, resp. rate 18, height 5\' 6"  (1.676 m), weight 141 lb 9.6 oz (64.229 kg), SpO2 98.00%.  Intake/Output from previous day:    Physical Exam:  HEENT: No thrush Lungs: Clear bilaterally Cardiac: Regular rate and rhythm Abdomen: Tender in the mid and right upper abdomen, no mass, no hepatomegaly Extremities: No leg edema   Lab Results:  Recent Labs  04/11/14 0445 04/13/14 0705  WBC 5.2 5.0  HGB 8.7* 9.6*  HCT 27.0* 29.5*  PLT 170 202    BMET  Recent Labs  04/11/14 0445 04/13/14 0705  NA 134* 137  K 4.0 4.0  CL 103 101  CO2 22 26  GLUCOSE 228* 79  BUN 13 13  CREATININE 0.78 0.67  CALCIUM 8.6 9.5    Studies/Results: No results found.  Medications: I have reviewed the patient's current medications.  Assessment/Plan: 1. 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  Initiation of radiation and Xeloda 07/20/2013 with completion 08/09/2013.  CT scans abdomen/pelvis done 11/01/2013 for evaluation of increased abdominal pain showed stable infiltrating pancreatic mass. No progression identified. Stable peripancreatic lymph nodes. 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 No evidence of metastatic disease.  Pancreatic head mass stable on CT 01/03/2014 and 03/07/2014 2. Obstructive jaundice secondary to #1. Status post placement of a bile duct stent on  05/24/2013. Status post placement of a metal stent 10/21/2013. 3. Pain -likely secondary to pancreas cancer, no improvement following the celiac plexus nerve block 02/10/2014 4. Diabetes. 5. History of chronic pancreatitis. 6. History of alcohol use. 7. History of tobacco use. 8. Anxiety/depression. 9. Cutaneous nodule at the left flank. Question cyst, question metastasis. 10. Abnormal EKG, question Brugada syndrome. 11. Hospitalization 08/12/2013 through 08/15/2013 with nausea/vomiting and abdominal pain. Improved. 12. 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 Portal vein patent. New wall thickening of the mid to distal stomach and the hepatic flexure and proximal transverse colon. 13. 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 She discontinued Lovenox due to abdominal wall ecchymoses and hematomas. 14. Admission 12/22/2013 with strep viridans bacteremia status post 4 week course of ceftriaxone.  15. Status post celiac plexus nerve block 02/10/2014.  16. 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 She started Pancrease after an office visit 02/07/2014 17. Elevated liver enzymes 18. Admission 04/09/2014 with bacteremia  Ms. Motter has unresectable pancreas cancer. We had planned to begin palliative gemcitabine/Abraxane chemotherapy 04/15/2014. She will not be able to receive chemotherapy until treatment of the bacteremia is completed. Ms. Deliz continues to have a poor understanding of the current situation. I am reluctant to administer chemotherapy unless her performance status improves.  I suspect the Escherichia coli bacteremia is related to the biliary stent. The liver enzymes remain elevated. We should  consider consulting  gastroenterology to evaluate the stent.  Recommendations:  1. Plans for gemcitabine/Abraxane will be placed on hold 2. Antibiotics per infectious disease 3. Narcotic analgesics for pain 4. Arrange for home health/personal care services at discharge-needs help with medication management   LOS: 4 days   Ladell Pier  04/13/2014, 1:24 PM

## 2014-04-13 NOTE — Progress Notes (Signed)
INFECTIOUS DISEASE PROGRESS NOTE  ID: [name] [name] is a 58 y.o. female with  Principal Problem:   Septicemia Active Problems:   DM (diabetes mellitus)   HTN (hypertension)   Dyslipidemia   Pancreatic cancer   SIRS (systemic inflammatory response syndrome)   Anemia   Fungemia  Subjective: Resting quietly.   Abtx:  Anti-infectives   Start     Dose/Rate Route Frequency Ordered Stop   04/12/14 1200  cefTRIAXone (ROCEPHIN) 1 g in dextrose 5 % 50 mL IVPB     1 g 100 mL/hr over 30 Minutes Intravenous Every 24 hours 04/12/14 1142     04/10/14 1500  micafungin (MYCAMINE) 100 mg in sodium chloride 0.9 % 100 mL IVPB     100 mg 100 mL/hr over 1 Hours Intravenous Daily 04/10/14 1403     04/10/14 1200  fluconazole (DIFLUCAN) IVPB 400 mg  Status:  Discontinued     400 mg 100 mL/hr over 120 Minutes Intravenous Every 24 hours 04/10/14 1131 04/10/14 1403   04/10/14 0500  vancomycin (VANCOCIN) IVPB 750 mg/150 ml premix  Status:  Discontinued     750 mg 150 mL/hr over 60 Minutes Intravenous Every 12 hours 04/09/14 1751 04/10/14 1403   04/09/14 2200  piperacillin-tazobactam (ZOSYN) IVPB 3.375 g  Status:  Discontinued     3.375 g 12.5 mL/hr over 240 Minutes Intravenous 3 times per day 04/09/14 1839 04/12/14 1142   04/09/14 1645  vancomycin (VANCOCIN) IVPB 1000 mg/200 mL premix     1,000 mg 200 mL/hr over 60 Minutes Intravenous  Once 04/09/14 1631 04/09/14 1830   04/09/14 1515  piperacillin-tazobactam (ZOSYN) IVPB 3.375 g     3.375 g 100 mL/hr over 30 Minutes Intravenous  Once 04/09/14 1509 04/09/14 1552      Medications:  Scheduled: . amLODipine  10 mg Oral Daily  . aspirin EC  81 mg Oral Daily  . cefTRIAXone (ROCEPHIN)  IV  1 g Intravenous Q24H  . enoxaparin (LOVENOX) injection  40 mg Subcutaneous Q24H  . gabapentin  300 mg Oral TID  . insulin aspart  0-9 Units Subcutaneous TID WC  . insulin glargine  20 Units Subcutaneous QHS  . lipase/protease/amylase  1 capsule Oral TID AC  .  lisinopril  40 mg Oral Daily  . loratadine  10 mg Oral Daily  . LORazepam  1 mg Intravenous Once  . metoCLOPramide  5 mg Oral TID AC  . metoprolol tartrate  25 mg Oral BID  . micafungin [facility]) IV  100 mg Intravenous Daily  . pantoprazole  40 mg Oral Daily  . potassium chloride  10 mEq Oral Daily  . simvastatin  10 mg Oral QHS    Objective: Vital signs in last 24 hours: Temp:  [98.3 F (36.8 C)-99.4 F (37.4 C)] 98.5 F (36.9 C) (05/27 1357) Pulse Rate:  [54-73] 55 (05/27 1357) Resp:  [18] 18 (05/27 1357) BP: (115-152)/(71-75) 120/75 mmHg (05/27 1357) SpO2:  [96 %-98 %] 96 % (05/27 1357)   General appearance: no distress  Lab Results  Recent Labs  04/11/14 0445 04/13/14 0705  WBC 5.2 5.0  HGB 8.7* 9.6*  HCT 27.0* 29.5*  NA 134* 137  K 4.0 4.0  CL 103 101  CO2 22 26  BUN 13 13  CREATININE 0.78 0.67   Liver Panel  Recent Labs  04/11/14 0445 04/13/14 0705  PROT 6.9 7.7  ALBUMIN 2.6* 2.9*  AST 105* 64*  ALT 86* 67*  ALKPHOS 554* 660*  BILITOT 0.6 0.6   Sedimentation Rate No results found for this basename: ESRSEDRATE,  in the last 72 hours C-Reactive Protein No results found for this basename: CRP,  in the last 72 hours  Microbiology: Recent Results (from the past 240 hour(s))  CULTURE, BLOOD (ROUTINE X 2)     Status: None   Collection Time    04/09/14  2:22 PM      Result Value Ref Range Status   Specimen Description BLOOD RIGHT HAND   Final   Special Requests BOTTLES DRAWN AEROBIC AND ANAEROBIC 3CC   Final   Culture  Setup Time     Final   Value: 04/09/2014 21:19     Performed at [facility]   Culture     Final   Value:        BLOOD CULTURE RECEIVED NO GROWTH TO DATE CULTURE WILL BE HELD FOR 5 DAYS BEFORE ISSUING A FINAL NEGATIVE REPORT     Performed at [facility]   Report Status PENDING   Incomplete  CULTURE, BLOOD (ROUTINE X 2)     Status: None   Collection Time    04/09/14  2:27 PM      Result Value Ref Range Status     Specimen Description BLOOD LEFT ARM   Final   Special Requests BOTTLES DRAWN AEROBIC AND ANAEROBIC 10CC   Final   Culture  Setup Time     Final   Value: 04/09/2014 21:19     Performed at [facility]   Culture     Final   Value: ESCHERICHIA COLI     Note: CORRECTED RESULTS CALLED TO: DR HATCHER [id number] BY DANTS     Note: Gram Stain Report Called to,Read Back By and Verified With: LINDA BUN 04/10/14 @ 10:15AM BY RUSCOE A. PREVIOUS REPORT=YEAST     Performed at [facility]   Report Status 04/13/2014 FINAL   Final   Organism ID, Bacteria ESCHERICHIA COLI   Final  URINE CULTURE     Status: None   Collection Time    04/10/14  8:13 AM      Result Value Ref Range Status   Specimen Description URINE, CLEAN CATCH   Final   Special Requests NONE   Final   Culture  Setup Time     Final   Value: 04/10/2014 19:15     Performed at [place]     Final   Value: NO GROWTH     Performed at [facility]   Culture     Final   Value: NO GROWTH     Performed at [facility]   Report Status 04/11/2014 FINAL   Final    Studies/Results: No results found.   Assessment/Plan: Fungemia  Bacteremia  UTI  Pancreatic CA  Total days of antibiotics: 4 mycafungin, 5 zosyn  Her e coli is fairly sensitive.  Her yeast has not been re-found. Will continue tx at this point.  Per Case Mgmt she is begin d[place].  She will need some one to accept care/responsibility of her PIC Ceftriaxone 1g IVPB qday for 9 days Micafungin 100mg  IVPB qday 10 days Pull PIC at end of therapy Weekly CBC, CMP Agree with onc re: stent and etiology of infection.  Available if questions         [facility] (pager) [phone] [email or web address] 04/13/2014, 2:39 PM  LOS: 4 days   **  Disclaimer: This note may have been dictated with voice recognition software. Similar sounding words can inadvertently be transcribed and  this note may contain transcription errors which may not have been corrected upon publication of note.**

## 2014-04-13 NOTE — Progress Notes (Signed)
Inpatient Diabetes Program Recommendations  AACE/ADA: New Consensus Statement on Inpatient Glycemic Control (2013)  Target Ranges:  Prepandial:   less than 140 mg/dL      Peak postprandial:   less than 180 mg/dL (1-2 hours)      Critically ill patients:  140 - 180 mg/dL   Results for KESSA, FAIRBAIRN (MRN [id number]) as of 04/13/2014 09:45  Ref. Range 04/12/2014 07:54 04/12/2014 12:14 04/12/2014 17:31 04/12/2014 21:16 04/13/2014 08:10  Glucose-Capillary Latest Range: 70-99 mg/dL 90 290 (H) 251 (H) 216 (H) 76   Diabetes history: DM  Outpatient Diabetes medications: Lantus 30 units QHS, Novolog 10 units TID with meals  Current orders for Inpatient glycemic control:Lantus 20 units QHS, Novolog 0-9 units AC  Inpatient Diabetes Program Recommendations Insulin - Meal Coverage: Post-prandial glucose consistently elevated greater than 200 mg/dl.  Please consider ordering Novolog 5 units TID with meals for meal coverage (in addition to correction scale) if patient is eating at least 50% of meals and pre-meal glucose is greater than 80 mg/dl.  Thanks, Barnie Alderman, RN, MSN, CCRN Diabetes Coordinator Inpatient Diabetes Program [phone] (Team Pager) [phone] ([facility] office) [phone] [facility] office)

## 2014-04-14 LAB — GLUCOSE, CAPILLARY
Glucose-Capillary: 111 mg/dL — ABNORMAL HIGH (ref 70–99)
Glucose-Capillary: 229 mg/dL — ABNORMAL HIGH (ref 70–99)
Glucose-Capillary: 256 mg/dL — ABNORMAL HIGH (ref 70–99)

## 2014-04-14 MED ORDER — HEPARIN SOD (PORK) LOCK FLUSH 100 UNIT/ML IV SOLN: 250.0000 [IU] | INTRAVENOUS | Status: DC | PRN

## 2014-04-14 NOTE — Discharge Summary (Signed)
[name] [name], is a 58 y.o. female  DOB [birth year]  MRN [id number].  Admission date:  04/09/2014  Admitting Physician  Cristal Ford, DO  Discharge Date:  04/14/2014   Primary MD  Philis Fendt, MD  Recommendations for primary care physician for things to follow:   Repeat CBC BMP and blood cultures in 2 weeks' time. Patient instructed that she should get this done at the urgent care or with her local physician if she is traveling out of [place].   Admission Diagnosis  Pancreatic cancer [157.9] HTN (hypertension) [401.9] DM (diabetes mellitus) [250.00] Dyslipidemia [272.4] SIRS (systemic inflammatory response syndrome) [995.90] Abdominal pain [789.00]   Discharge Diagnosis  Pancreatic cancer [157.9] HTN (hypertension) [401.9] DM (diabetes mellitus) [250.00] Dyslipidemia [272.4] SIRS (systemic inflammatory response syndrome) [995.90] Abdominal pain [789.00]   Bacteremia, fungemia  Principal Problem:   Septicemia Active Problems:   DM (diabetes mellitus)   HTN (hypertension)   Dyslipidemia   Pancreatic cancer   SIRS (systemic inflammatory response syndrome)   Anemia   Fungemia      Past Medical History  Diagnosis Date  . DM (diabetes mellitus), type 2     requires insulin  . Hypertension   . Depression   . Anxiety   . Hyperlipidemia   . Pancreatitis   . GERD (gastroesophageal reflux disease)   . Pancreatic cancer 05/2013    adenocarcinoma on ERCP/FNA  . Hearing impaired     lost the last of her hearing aids and cn not get another one.  hearing is better via right ear.   Marland Kitchen History of radiation therapy 07/20/13-08/09/13    Pancreas 37.5Gy  . DDD (degenerative disc disease), lumbar   . Chronic back pain   . Stroke   . Anemia   . Ulcer 01/2014    radiation gastroduodenitis with duodenal ulcer.     Past  Surgical History  Procedure Laterality Date  . Appendectomy    . Ercp N/A 05/24/2013    Procedure: ENDOSCOPIC RETROGRADE CHOLANGIOPANCREATOGRAPHY (ERCP);  Surgeon: Milus Banister, MD;  Location: [facility];  Service: Endoscopy;  Laterality: N/A;  MAC vs. general per anesthesia   . Tubal ligation    . Endoscopic retrograde cholangiopancreatography (ercp) with propofol N/A 10/21/2013    Procedure: ENDOSCOPIC RETROGRADE CHOLANGIOPANCREATOGRAPHY (ERCP) WITH PROPOFOL;  Surgeon: Milus Banister, MD;  Location: [facility];  Service: Endoscopy;  Laterality: N/A;  Stent removal   . Biliary stent placement N/A 10/21/2013    Procedure: BILIARY STENT PLACEMENT;  Surgeon: Milus Banister, MD;  Location: [facility];  Service: Endoscopy;  Laterality: N/A;  . Tee without cardioversion N/A 12/29/2013    Procedure: TRANSESOPHAGEAL ECHOCARDIOGRAM (TEE);  Surgeon: Dorothy Spark, MD;  Location: [facility];  Service: Cardiovascular;  Laterality: N/A;  . Eus N/A 02/10/2014    Procedure: UPPER ENDOSCOPIC ULTRASOUND (EUS) LINEAR;  Surgeon: Milus Banister, MD;  Location: [facility];  Service: Endoscopy;  Laterality: N/A;  celiac plexus neurolysis  . Bile duct stent placement  Discharge Condition: Stable with poor long term prognosis   Follow UP  Follow-up Information   Follow up with AVBUERE,EDWIN A, MD. Schedule an appointment as soon as possible for a visit in 1 week.   Specialty:  Internal Medicine   Contact information:   [address] [place] [address] [phone]       Follow up with Bobby Rumpf, MD. Schedule an appointment as soon as possible for a visit in 1 week.   Specialty:  Infectious Diseases   Contact information:   [place] [address] [phone]       Follow up with Betsy Coder, MD. Schedule an appointment as soon as possible for a visit in 1 week.   Specialty:  Oncology   Contact information:   [place]  [place] [address] [phone]         Discharge Instructions  and  Discharge Medications         Discharge Instructions   Discharge instructions    Complete by:  As directed   Follow with Primary MD AVBUERE,EDWIN A, MD in 7 days, you need to get CBC, BMP and repeat blood cultures drawn in 3 weeks by local physician or urgent care. If you're traveling outside of [place]. If her increase her you must follow with Dr. Johnnye Sima in 2-3 weeks' time.    Activity: As tolerated with Full fall precautions use walker/cane & assistance as needed   Disposition Home    Diet: Heart Healthy - Low Carb  For Heart failure patients - Check your Weight same time everyday, if you gain over 2 pounds, or you develop in leg swelling, experience more shortness of breath or chest pain, call your Primary MD immediately. Follow Cardiac Low Salt Diet and 1.8 lit/day fluid restriction.   On your next visit with her primary care physician please Get Medicines reviewed and adjusted.  Please request your Prim.MD to go over all Hospital Tests and Procedure/Radiological results at the follow up, please get all Hospital records sent to your Prim MD by signing hospital release before you go home.   If you experience worsening of your admission symptoms, develop shortness of breath, life threatening emergency, suicidal or homicidal thoughts you must seek medical attention immediately by calling 911 or calling your MD immediately  if symptoms less severe.  You Must read complete instructions/literature along with all the possible adverse reactions/side effects for all the Medicines you take and that have been prescribed to you. Take any new Medicines after you have completely understood and accpet all the possible adverse reactions/side effects.   Do not drive and provide baby sitting services if your were admitted for syncope or siezures until you have seen by Primary MD or a Neurologist and advised to do so again.  Do  not drive when taking Pain medications.    Do not take more than prescribed Pain, Sleep and Anxiety Medications  Special Instructions: If you have smoked or chewed Tobacco  in the last 2 yrs please stop smoking, stop any regular Alcohol  and or any Recreational drug use.  Wear Seat belts while driving.   Please note  You were cared for by a hospitalist during your hospital stay. If you have any questions about your discharge medications or the care you received while you were in the hospital after you are discharged, you can call the unit and asked to speak with the hospitalist on call if the hospitalist that  took care of you is not available. Once you are discharged, your primary care physician will handle any further medical issues. Please note that NO REFILLS for any discharge medications will be authorized once you are discharged, as it is imperative that you return to your primary care physician (or establish a relationship with a primary care physician if you do not have one) for your aftercare needs so that they can reassess your need for medications and monitor your lab values.  Follow with Primary MD AVBUERE,EDWIN A, MD in 7 days, you need to get CBC, BMP and repeat blood cultures drawn in 3 weeks by local physician or urgent care. If you're traveling outside of [place]. If her increase her you must follow with Dr. Johnnye Sima in 2-3 weeks' time.    Activity: As tolerated with Full fall precautions use walker/cane & assistance as needed   Disposition Home    Diet: Heart Healthy - Low Carb  For Heart failure patients - Check your Weight same time everyday, if you gain over 2 pounds, or you develop in leg swelling, experience more shortness of breath or chest pain, call your Primary MD immediately. Follow Cardiac Low Salt Diet and 1.8 lit/day fluid restriction.   On your next visit with her primary care physician please Get Medicines reviewed and adjusted.  Please request your  Prim.MD to go over all Hospital Tests and Procedure/Radiological results at the follow up, please get all Hospital records sent to your Prim MD by signing hospital release before you go home.   If you experience worsening of your admission symptoms, develop shortness of breath, life threatening emergency, suicidal or homicidal thoughts you must seek medical attention immediately by calling 911 or calling your MD immediately  if symptoms less severe.  You Must read complete instructions/literature along with all the possible adverse reactions/side effects for all the Medicines you take and that have been prescribed to you. Take any new Medicines after you have completely understood and accpet all the possible adverse reactions/side effects.   Do not drive and provide baby sitting services if your were admitted for syncope or siezures until you have seen by Primary MD or a Neurologist and advised to do so again.  Do not drive when taking Pain medications.    Do not take more than prescribed Pain, Sleep and Anxiety Medications  Special Instructions: If you have smoked or chewed Tobacco  in the last 2 yrs please stop smoking, stop any regular Alcohol  and or any Recreational drug use.  Wear Seat belts while driving.   Please note  You were cared for by a hospitalist during your hospital stay. If you have any questions about your discharge medications or the care you received while you were in the hospital after you are discharged, you can call the unit and asked to speak with the hospitalist on call if the hospitalist that took care of you is not available. Once you are discharged, your primary care physician will handle any further medical issues. Please note that NO REFILLS for any discharge medications will be authorized once you are discharged, as it is imperative that you return to your primary care physician (or establish a relationship with a primary care physician if you do not have one) for  your aftercare needs so that they can reassess your need for medications and monitor your lab values.     Increase activity slowly    Complete by:  As directed  Medication List         ADVIL 200 MG tablet  Generic drug:  ibuprofen  Take 800 mg by mouth every 6 (six) hours as needed for mild pain or moderate pain.     amLODipine 10 MG tablet  Commonly known as:  NORVASC  Take 1 tablet (10 mg total) by mouth daily.     aspirin 81 MG tablet  Take 81 mg by mouth daily.     cefTRIAXone 1 g in dextrose 5 % 50 mL  Inject 1 g into the vein daily.     cetirizine 10 MG tablet  Commonly known as:  ZYRTEC  Take 10 mg by mouth daily.     docusate sodium 100 MG capsule  Commonly known as:  COLACE  Take 1 capsule (100 mg total) by mouth 2 (two) times daily as needed for mild constipation.     gabapentin 300 MG capsule  Commonly known as:  NEURONTIN  Take 300 mg by mouth 3 (three) times daily.     insulin aspart 100 UNIT/ML injection  Commonly known as:  novoLOG  Inject 10 Units into the skin 3 (three) times daily with meals.     insulin glargine 100 UNIT/ML injection  Commonly known as:  LANTUS  Inject 30 Units into the skin at bedtime.     lipase/protease/amylase 12000 UNITS Cpep capsule  Commonly known as:  CREON-12/PANCREASE  Take 1 capsule by mouth 3 (three) times daily before meals.     lisinopril 40 MG tablet  Commonly known as:  PRINIVIL,ZESTRIL  Take 40 mg by mouth daily.     metoCLOPramide 5 MG tablet  Commonly known as:  REGLAN  Take 5 mg by mouth 3 (three) times daily before meals.     metoprolol tartrate 25 MG tablet  Commonly known as:  LOPRESSOR  Take 25 mg by mouth 2 (two) times daily.     micafungin 100 mg in sodium chloride 0.9 % 100 mL  Inject 100 mg into the vein daily.     ondansetron 8 MG disintegrating tablet  Commonly known as:  ZOFRAN ODT  Take 1 tablet (8 mg total) by mouth every 8 (eight) hours as needed for nausea or vomiting.       oxycodone 5 MG capsule  Commonly known as:  OXY-IR  Take 1 capsule (5 mg total) by mouth every 6 (six) hours as needed for pain.     Oxycodone HCl 10 MG Tabs  Take 1 tablet (10 mg total) by mouth every 4 (four) hours as needed.     pantoprazole 40 MG tablet  Commonly known as:  PROTONIX  Take 40 mg by mouth daily.     polyethylene glycol packet  Commonly known as:  MIRALAX / GLYCOLAX  Take 17 g by mouth daily as needed for mild constipation or moderate constipation.     potassium chloride 10 MEQ tablet  Commonly known as:  K-DUR,KLOR-CON  Take 10 mEq by mouth daily.     simethicone 125 MG chewable tablet  Commonly known as:  MYLICON  Chew 400 mg by mouth every 6 (six) hours as needed for flatulence.     simvastatin 10 MG tablet  Commonly known as:  ZOCOR  Take 10 mg by mouth at bedtime.     sucralfate 1 GM/10ML suspension  Commonly known as:  CARAFATE  Take 1 g by mouth 2 (two) times daily.          Diet and Activity  recommendation: See Discharge Instructions above   Consults obtained - ID, oncology   Major procedures and Radiology Reports - PLEASE review detailed and final reports for all details, in brief -    Dg Chest Portable 1 View  04/09/2014   CLINICAL DATA:  Fever, pancreatitis, pancreatic cancer  EXAM: PORTABLE CHEST - 1 VIEW  COMPARISON:  12/31/2013  FINDINGS: Cardiomediastinal silhouette is stable. No acute infiltrate or pleural effusion. No pulmonary edema. Mild degenerative changes thoracic spine.  IMPRESSION: No active disease.   Electronically Signed   By: Lahoma Crocker M.D.   On: 04/09/2014 15:52    Micro Results     Recent Results (from the past 240 hour(s))  CULTURE, BLOOD (ROUTINE X 2)     Status: None   Collection Time    04/09/14  2:22 PM      Result Value Ref Range Status   Specimen Description BLOOD RIGHT HAND   Final   Special Requests BOTTLES DRAWN AEROBIC AND ANAEROBIC 3CC   Final   Culture  Setup Time     Final   Value:  04/09/2014 21:19     Performed at [facility]   Culture     Final   Value:        BLOOD CULTURE RECEIVED NO GROWTH TO DATE CULTURE WILL BE HELD FOR 5 DAYS BEFORE ISSUING A FINAL NEGATIVE REPORT     Performed at [facility]   Report Status PENDING   Incomplete  CULTURE, BLOOD (ROUTINE X 2)     Status: None   Collection Time    04/09/14  2:27 PM      Result Value Ref Range Status   Specimen Description BLOOD LEFT ARM   Final   Special Requests BOTTLES DRAWN AEROBIC AND ANAEROBIC 10CC   Final   Culture  Setup Time     Final   Value: 04/09/2014 21:19     Performed at [facility]   Culture     Final   Value: ESCHERICHIA COLI     Note: CORRECTED RESULTS CALLED TO: DR HATCHER [id number] BY DANTS     Note: Gram Stain Report Called to,Read Back By and Verified With: LINDA BUN 04/10/14 @ 10:15AM BY RUSCOE A. PREVIOUS REPORT=YEAST     Performed at [facility]   Report Status 04/13/2014 FINAL   Final   Organism ID, Bacteria ESCHERICHIA COLI   Final  URINE CULTURE     Status: None   Collection Time    04/10/14  8:13 AM      Result Value Ref Range Status   Specimen Description URINE, CLEAN CATCH   Final   Special Requests NONE   Final   Culture  Setup Time     Final   Value: 04/10/2014 19:15     Performed at [facility]     Final   Value: NO GROWTH     Performed at [facility]   Culture     Final   Value: NO GROWTH     Performed at [facility]   Report Status 04/11/2014 FINAL   Final     History of present illness and  Hospital Course:     Kindly see H&P for history of present illness and admission details, please review complete Labs, Consult reports and Test reports for all details in brief [name] [name], is a 58 y.o. female, patient with history of   obstructive jaundice  with stent placement December 2004, chronic abdominal pain presents emergency department today for fever and chills. Patient's daughter is also  did provide some of the history. Patient started having fever and chills approximately 2 hours before arrival to emergency department. She is also complaining of feeling very weak. Patient woke up this morning feeling somewhat tired, however states she was per usual self. She denies any nausea, vomiting, diarrhea, sick contacts, recent travel. Her for her, patient recently had an episode in February of this year, for which she was found to have a "blood infection and was treated with antibiotics." Her workup here was positive for bacteremia and fungemia likely source biliary stent which was placed in 2014.       Sepsis with Gram-negative bacteremia and fungemia   Likely source her biliary stent, cultures noted, seen by ID, right arm PICC line placed. Goal is for her to be on Rocephin and micafungin for 10 more days, see below for start date. Home health R.N. will be arranged for the same. Patient has plans to travel outside of [place], she is been advised to see a local physician or urgent care and get repeat CBC BMP and blood cultures drawn in 2 weeks' time, she will also follow with her PCP and ID physician here. Instructions will be given to her in writing as well. Case management is also assisting the patient in making arrangements. She is now afebrile and stable however due to her underlying malignancy her long-term prognosis continues to be poor.     DM (diabetes mellitus)   Resume home regimen of insulin    HTN (hypertension)  -controlled, c/w Amlodipine,Lisinopril and Metoprolol     Dyslipidemia  -c/w Statins      Anemia  -consistent with anemia of chronic disease, stable Hb      Pancreatic cancer  -Adenocarcinoma of the pancreas presenting with an obstructing pancreas head mass, clinical stage II versus III (T3-T4, N1)-previously treated Xeloda and radiation.Reveiwed Oncology notes-plans were to be treated with a first cycle of gemcitabine/Abraxane on 04/15/2014. Seen  by Dr. Truett Perna oncologist await his final note, previously according to him patient is a poor candidate for further chemotherapy-given her lack of understanding of her disease. Seen by oncology again this admission and no plans of providing chemotherapy or radiation currently due to patient's continued poor functional status and understanding of her overall disease, continue close followup with oncology on outpatient basis if there is clinical decline and oncology is agreeable she might benefit from palliative care/hospice      Chronic Elevated LFT's  -likely secondary to above, follow periodically  -has a CBD stent in place     Chronic Portal Vein Thrombosis  -spoke with Dr Truett Perna, this is chronic and from direct invasion of the tumor,patient was previously on Lovenox-which she refused to take. Oncology does not advise any further anticoagulation at this point.       Today   Subjective:   [name] [name] today has no headache,no chest abdominal pain,no new weakness tingling or numbness, feels much better wants to go home today.    Objective:   Blood pressure 120/70, pulse 57, temperature 98.3 F (36.8 C), temperature source Oral, resp. rate 18, height 5\' 6"  (1.676 m), weight 64.229 kg (141 lb 9.6 oz), SpO2 99.00%.  No intake or output data in the 24 hours ending 04/14/14 1042  Exam Awake Alert, Oriented x 3, No new F.N deficits, Normal affect [place].AT,PERRAL Supple Neck,No JVD, No cervical lymphadenopathy appriciated.  Symmetrical Chest wall movement, Good air movement bilaterally, CTAB RRR,No Gallops,Rubs or new Murmurs, No Parasternal Heave +ve B.Sounds, Abd Soft, Non tender, No organomegaly appriciated, No rebound -guarding or rigidity. No Cyanosis, Clubbing or edema, No new Rash or bruise  Data Review   CBC w Diff:  Lab Results  Component Value Date   WBC 5.0 04/13/2014   WBC 4.4 08/17/2013   HGB 9.6* 04/13/2014   HGB 11.6 08/17/2013   HCT 29.5* 04/13/2014   HCT 34.8  08/17/2013   PLT 202 04/13/2014   PLT 116* 08/17/2013   LYMPHOPCT 3* 04/09/2014   LYMPHOPCT 8.4* 08/17/2013   MONOPCT 6 04/09/2014   MONOPCT 16.7* 08/17/2013   EOSPCT 1 04/09/2014   EOSPCT 12.2* 08/17/2013   BASOPCT 0 04/09/2014   BASOPCT 0.5 08/17/2013    CMP:  Lab Results  Component Value Date   NA 137 04/13/2014   NA 139 08/17/2013   K 4.0 04/13/2014   K 3.7 08/17/2013   CL 101 04/13/2014   CO2 26 04/13/2014   CO2 25 08/17/2013   BUN 13 04/13/2014   BUN 11.9 08/17/2013   CREATININE 0.67 04/13/2014   CREATININE 0.8 08/17/2013   PROT 7.7 04/13/2014   PROT 7.6 08/03/2013   ALBUMIN 2.9* 04/13/2014   ALBUMIN 3.5 08/03/2013   BILITOT 0.6 04/13/2014   BILITOT 0.56 08/03/2013   ALKPHOS 660* 04/13/2014   ALKPHOS 172* 08/03/2013   AST 64* 04/13/2014   AST 16 08/03/2013   ALT 67* 04/13/2014   ALT 35 08/03/2013  .   Total Time in preparing paper work, data evaluation and todays exam - 35 minutes  Thurnell Lose M.D on 04/14/2014 at 10:42 AM  [facility] Office  [phone]   **Disclaimer: This note may have been dictated with voice recognition software. Similar sounding words can inadvertently be transcribed and this note may contain transcription errors which may not have been corrected upon publication of note.**

## 2014-04-14 NOTE — Discharge Instructions (Signed)
Follow with Primary MD AVBUERE,EDWIN A, MD in 7 days, you need to get CBC, BMP and repeat blood cultures drawn in 3 weeks by local physician or urgent care. If you're traveling outside of [place]. If her increase her you must follow with Dr. Johnnye Sima in 2-3 weeks' time.    Activity: As tolerated with Full fall precautions use walker/cane & assistance as needed   Disposition Home    Diet: Heart Healthy - Low Carb  For Heart failure patients - Check your Weight same time everyday, if you gain over 2 pounds, or you develop in leg swelling, experience more shortness of breath or chest pain, call your Primary MD immediately. Follow Cardiac Low Salt Diet and 1.8 lit/day fluid restriction.   On your next visit with her primary care physician please Get Medicines reviewed and adjusted.  Please request your Prim.MD to go over all Hospital Tests and Procedure/Radiological results at the follow up, please get all Hospital records sent to your Prim MD by signing hospital release before you go home.   If you experience worsening of your admission symptoms, develop shortness of breath, life threatening emergency, suicidal or homicidal thoughts you must seek medical attention immediately by calling 911 or calling your MD immediately  if symptoms less severe.  You Must read complete instructions/literature along with all the possible adverse reactions/side effects for all the Medicines you take and that have been prescribed to you. Take any new Medicines after you have completely understood and accpet all the possible adverse reactions/side effects.   Do not drive and provide baby sitting services if your were admitted for syncope or siezures until you have seen by Primary MD or a Neurologist and advised to do so again.  Do not drive when taking Pain medications.    Do not take more than prescribed Pain, Sleep and Anxiety Medications  Special Instructions: If you have smoked or chewed Tobacco  in  the last 2 yrs please stop smoking, stop any regular Alcohol  and or any Recreational drug use.  Wear Seat belts while driving.   Please note  You were cared for by a hospitalist during your hospital stay. If you have any questions about your discharge medications or the care you received while you were in the hospital after you are discharged, you can call the unit and asked to speak with the hospitalist on call if the hospitalist that took care of you is not available. Once you are discharged, your primary care physician will handle any further medical issues. Please note that NO REFILLS for any discharge medications will be authorized once you are discharged, as it is imperative that you return to your primary care physician (or establish a relationship with a primary care physician if you do not have one) for your aftercare needs so that they can reassess your need for medications and monitor your lab values.

## 2014-04-14 NOTE — Progress Notes (Signed)
Inpatient Diabetes Program Recommendations  AACE/ADA: New Consensus Statement on Inpatient Glycemic Control (2013)  Target Ranges:  Prepandial:   less than 140 mg/dL      Peak postprandial:   less than 180 mg/dL (1-2 hours)      Critically ill patients:  140 - 180 mg/dL   Results for ELZADA, PYTEL (MRN [id number]) as of 04/14/2014 12:08  Ref. Range 04/13/2014 08:10 04/13/2014 12:02 04/13/2014 17:02 04/13/2014 21:54 04/14/2014 07:54  Glucose-Capillary Latest Range: 70-99 mg/dL 76 201 (H) 244 (H) 249 (H) 111 (H)   Diabetes history: DM  Outpatient Diabetes medications: Lantus 30 units QHS, Novolog 10 units TID with meals  Current orders for Inpatient glycemic control:Lantus 20 units QHS, Novolog 0-9 units AC   Inpatient Diabetes Program Recommendations Insulin - Meal Coverage: Post prandial glucose consistently elevated > 200 mg/dl therefore patient would benefit from Novolog meal coverage. Please consider ordering Novolog 5 units TID with meals for meal coverage (in addition to correction scale) if patient is eating at least 50% of meals.  Thanks, Barnie Alderman, RN, MSN, CCRN Diabetes Coordinator Inpatient Diabetes Program [phone] (Team Pager) [phone] ([facility] office) [phone] [facility] office)

## 2014-04-14 NOTE — Progress Notes (Signed)
Pt discharged per physician's order. Discharge papers given to pt by dayshift nurse. Pt A&Ox4 upon discharge. Nurse tech wheel pt to front lobby - pt's daughter pick pt up. IV team disconnect pt's PICC line. IV abx sent home with pt. Skin c/d/i. Pt in no s/s of distress upon discharge.

## 2014-04-14 NOTE — Progress Notes (Signed)
INFECTIOUS DISEASE PROGRESS NOTE  ID: [name] [name] is a 58 y.o. female with  Principal Problem:   Septicemia Active Problems:   DM (diabetes mellitus)   HTN (hypertension)   Dyslipidemia   Pancreatic cancer   SIRS (systemic inflammatory response syndrome)   Anemia   Fungemia  Subjective: Resting quietly Without complaints  Abtx:  Anti-infectives   Start     Dose/Rate Route Frequency Ordered Stop   04/14/14 0000  micafungin 100 mg in sodium chloride 0.9 % 100 mL     100 mg 100 mL/hr over 1 Hours Intravenous Daily 04/13/14 1449 04/23/14 2359   04/13/14 0000  cefTRIAXone 1 g in dextrose 5 % 50 mL     1 g 100 mL/hr over 30 Minutes Intravenous Every 24 hours 04/13/14 1449 04/22/14 2359   04/12/14 1200  cefTRIAXone (ROCEPHIN) 1 g in dextrose 5 % 50 mL IVPB     1 g 100 mL/hr over 30 Minutes Intravenous Every 24 hours 04/12/14 1142 04/23/14 1159   04/10/14 1500  micafungin (MYCAMINE) 100 mg in sodium chloride 0.9 % 100 mL IVPB     100 mg 100 mL/hr over 1 Hours Intravenous Daily 04/10/14 1403 04/24/14 1959   04/10/14 1200  fluconazole (DIFLUCAN) IVPB 400 mg  Status:  Discontinued     400 mg 100 mL/hr over 120 Minutes Intravenous Every 24 hours 04/10/14 1131 04/10/14 1403   04/10/14 0500  vancomycin (VANCOCIN) IVPB 750 mg/150 ml premix  Status:  Discontinued     750 mg 150 mL/hr over 60 Minutes Intravenous Every 12 hours 04/09/14 1751 04/10/14 1403   04/09/14 2200  piperacillin-tazobactam (ZOSYN) IVPB 3.375 g  Status:  Discontinued     3.375 g 12.5 mL/hr over 240 Minutes Intravenous 3 times per day 04/09/14 1839 04/12/14 1142   04/09/14 1645  vancomycin (VANCOCIN) IVPB 1000 mg/200 mL premix     1,000 mg 200 mL/hr over 60 Minutes Intravenous  Once 04/09/14 1631 04/09/14 1830   04/09/14 1515  piperacillin-tazobactam (ZOSYN) IVPB 3.375 g     3.375 g 100 mL/hr over 30 Minutes Intravenous  Once 04/09/14 1509 04/09/14 1552      Medications:  Scheduled: . amLODipine  10 mg Oral  Daily  . aspirin EC  81 mg Oral Daily  . cefTRIAXone (ROCEPHIN)  IV  1 g Intravenous Q24H  . enoxaparin (LOVENOX) injection  40 mg Subcutaneous Q24H  . gabapentin  300 mg Oral TID  . insulin aspart  0-9 Units Subcutaneous TID WC  . insulin glargine  20 Units Subcutaneous QHS  . lipase/protease/amylase  1 capsule Oral TID AC  . lisinopril  40 mg Oral Daily  . loratadine  10 mg Oral Daily  . metoCLOPramide  5 mg Oral TID AC  . metoprolol tartrate  25 mg Oral BID  . micafungin [facility]) IV  100 mg Intravenous Daily  . pantoprazole  40 mg Oral Daily  . potassium chloride  10 mEq Oral Daily  . simvastatin  10 mg Oral QHS    Objective: Vital signs in last 24 hours: Temp:  [98.2 F (36.8 C)-98.8 F (37.1 C)] 98.2 F (36.8 C) (05/28 1347) Pulse Rate:  [57-69] 65 (05/28 1347) Resp:  [18] 18 (05/28 1347) BP: (120-156)/(70-82) 133/76 mmHg (05/28 1347) SpO2:  [93 %-99 %] 93 % (05/28 1347)   General appearance: alert, cooperative and no distress  Lab Results  Recent Labs  04/13/14 0705  WBC 5.0  HGB 9.6*  HCT 29.5*  NA 137  K 4.0  CL 101  CO2 26  BUN 13  CREATININE 0.67   Liver Panel  Recent Labs  04/13/14 0705  PROT 7.7  ALBUMIN 2.9*  AST 64*  ALT 67*  ALKPHOS 660*  BILITOT 0.6   Sedimentation Rate No results found for this basename: ESRSEDRATE,  in the last 72 hours C-Reactive Protein No results found for this basename: CRP,  in the last 72 hours  Microbiology: Recent Results (from the past 240 hour(s))  CULTURE, BLOOD (ROUTINE X 2)     Status: None   Collection Time    04/09/14  2:22 PM      Result Value Ref Range Status   Specimen Description BLOOD RIGHT HAND   Final   Special Requests BOTTLES DRAWN AEROBIC AND ANAEROBIC 3CC   Final   Culture  Setup Time     Final   Value: 04/09/2014 21:19     Performed at [facility]   Culture     Final   Value:        BLOOD CULTURE RECEIVED NO GROWTH TO DATE CULTURE WILL BE HELD FOR 5 DAYS BEFORE ISSUING  A FINAL NEGATIVE REPORT     Performed at [facility]   Report Status PENDING   Incomplete  CULTURE, BLOOD (ROUTINE X 2)     Status: None   Collection Time    04/09/14  2:27 PM      Result Value Ref Range Status   Specimen Description BLOOD LEFT ARM   Final   Special Requests BOTTLES DRAWN AEROBIC AND ANAEROBIC 10CC   Final   Culture  Setup Time     Final   Value: 04/09/2014 21:19     Performed at [facility]   Culture     Final   Value: ESCHERICHIA COLI     Note: CORRECTED RESULTS CALLED TO: DR HATCHER [id number] BY DANTS     Note: Gram Stain Report Called to,Read Back By and Verified With: LINDA BUN 04/10/14 @ 10:15AM BY RUSCOE A. PREVIOUS REPORT=YEAST     Performed at [facility]   Report Status 04/13/2014 FINAL   Final   Organism ID, Bacteria ESCHERICHIA COLI   Final  URINE CULTURE     Status: None   Collection Time    04/10/14  8:13 AM      Result Value Ref Range Status   Specimen Description URINE, CLEAN CATCH   Final   Special Requests NONE   Final   Culture  Setup Time     Final   Value: 04/10/2014 19:15     Performed at [place]     Final   Value: NO GROWTH     Performed at [facility]   Culture     Final   Value: NO GROWTH     Performed at [facility]   Report Status 04/11/2014 FINAL   Final    Studies/Results: No results found.   Assessment/Plan: Fungemia  Bacteremia  UTI  Pancreatic CA  Total days of antibiotics: 5 mycafungin, 6 zosyn  Working on getting d/c. Plan for 9 days of mycafungin, 8 days of ceftriaxone Available if questions          [facility] (pager) [phone] [email or web address] 04/14/2014, 2:17 PM  LOS: 5 days   **Disclaimer: This note may have been dictated with voice recognition software. Similar sounding words can inadvertently be transcribed  and this note may contain transcription errors which may not have been corrected upon  publication of note.**

## 2014-04-14 NOTE — Care Management Note (Signed)
    Page 1 of 2   04/14/2014     11:49:12 AM CARE MANAGEMENT NOTE 04/14/2014  Patient:  [name] [name], [name] [name]   Account Number:  [id number]  Date Initiated:  04/13/2014  Documentation initiated by:  Tomi Bamberger  Subjective/Objective Assessment:   admit-lives with daughter.     Action/Plan:   will need home IV ABX.  [facility].   Anticipated DC Date:  04/14/2014   Anticipated DC Plan:  [place]  CM consult      [facility] Choice  HOME HEALTH   Choice offered to / List presented to:  C-1 Patient        [place] arranged  HH-1 RN      [facility] agency  OTHER - SEE NOTE  [facility]   Status of service:  Completed, signed off Medicare Important Message given?   (If response is "NO", the following Medicare IM given date fields will be blank) Date Medicare IM given:   Date Additional Medicare IM given:    Discharge Disposition:  [place]  Per UR Regulation:  Reviewed for med. necessity/level of care/duration of stay  If discussed at [place] of Stay Meetings, dates discussed:    Comments:  04/13/14 [place], BSN [place] with [facility] called, informed her patient is for dc today and both medications are to be stopped on 6/5 per MD, so actually this will end before she goes to [place].   NCM faxed picc line information to [facility],  per Lanelle Bal the rep , a [facility] with [facility] will be seeing patient on 04/15/14, patient will received both iv meds here in the hospital today before she leaves, RN aware.  MD states he will put in his dc summary that patient is to go to an Urgent Care in [place] so they can follow her blood work.  04/13/14 [place], BSN [phone] Patient chose Home infusion for iv abx, NCM made referral to [facility] , Natalie notified [phone]. Patient states she will be in [place] for a month with her daughter, patient  has medicaid of [place].  Natalie with [facility] states they will be able to set up infusion services for patient. Lanelle Bal came to speak with patient and her daughter. Patient received picc line today.  Patient is for dc tomorrow.  IV Abx scripts and HHRN orders were given to [facility] with [facility].

## 2014-04-15 ENCOUNTER — Other Ambulatory Visit (HOSPITAL_BASED_OUTPATIENT_CLINIC_OR_DEPARTMENT_OTHER): Payer: Medicaid Other

## 2014-04-15 ENCOUNTER — Ambulatory Visit: Payer: Medicaid Other

## 2014-04-15 ENCOUNTER — Ambulatory Visit (HOSPITAL_BASED_OUTPATIENT_CLINIC_OR_DEPARTMENT_OTHER): Payer: Medicaid Other

## 2014-04-15 DIAGNOSIS — Z452 Encounter for adjustment and management of vascular access device: Secondary | ICD-10-CM

## 2014-04-15 DIAGNOSIS — C250 Malignant neoplasm of head of pancreas: Secondary | ICD-10-CM

## 2014-04-15 LAB — COMPREHENSIVE METABOLIC PANEL (CC13)
ALT: 88 U/L — ABNORMAL HIGH (ref 0–55)
ANION GAP: 11 meq/L (ref 3–11)
AST: 166 U/L — AB (ref 5–34)
Albumin: 3 g/dL — ABNORMAL LOW (ref 3.5–5.0)
Alkaline Phosphatase: 978 U/L — ABNORMAL HIGH (ref 40–150)
BILIRUBIN TOTAL: 0.85 mg/dL (ref 0.20–1.20)
BUN: 15.1 mg/dL (ref 7.0–26.0)
CALCIUM: 9.6 mg/dL (ref 8.4–10.4)
CO2: 20 meq/L — AB (ref 22–29)
CREATININE: 0.7 mg/dL (ref 0.6–1.1)
Chloride: 105 mEq/L (ref 98–109)
Glucose: 130 mg/dl (ref 70–140)
Potassium: 4.3 mEq/L (ref 3.5–5.1)
Sodium: 136 mEq/L (ref 136–145)
Total Protein: 7.8 g/dL (ref 6.4–8.3)

## 2014-04-15 LAB — CBC WITH DIFFERENTIAL/PLATELET
BASO%: 0.2 % (ref 0.0–2.0)
Basophils Absolute: 0 10*3/uL (ref 0.0–0.1)
EOS%: 3.5 % (ref 0.0–7.0)
Eosinophils Absolute: 0.2 10*3/uL (ref 0.0–0.5)
HCT: 29.7 % — ABNORMAL LOW (ref 34.8–46.6)
HGB: 9.5 g/dL — ABNORMAL LOW (ref 11.6–15.9)
LYMPH#: 0.6 10*3/uL — AB (ref 0.9–3.3)
LYMPH%: 10.6 % — AB (ref 14.0–49.7)
MCH: 25.9 pg (ref 25.1–34.0)
MCHC: 32 g/dL (ref 31.5–36.0)
MCV: 80.9 fL (ref 79.5–101.0)
MONO#: 0.5 10*3/uL (ref 0.1–0.9)
MONO%: 8.3 % (ref 0.0–14.0)
NEUT#: 4.7 10*3/uL (ref 1.5–6.5)
NEUT%: 77.4 % — AB (ref 38.4–76.8)
PLATELETS: 217 10*3/uL (ref 145–400)
RBC: 3.67 10*6/uL — ABNORMAL LOW (ref 3.70–5.45)
RDW: 16.8 % — ABNORMAL HIGH (ref 11.2–14.5)
WBC: 6.1 10*3/uL (ref 3.9–10.3)

## 2014-04-15 LAB — CULTURE, BLOOD (ROUTINE X 2): Culture: NO GROWTH

## 2014-04-15 MED ORDER — SODIUM CHLORIDE 0.9 % IJ SOLN
10.0000 mL | INTRAMUSCULAR | Status: DC | PRN
  Administered 2014-04-15: 10 mL via INTRAVENOUS
  Filled 2014-04-15: qty 10

## 2014-04-15 MED ORDER — HEPARIN SOD (PORK) LOCK FLUSH 100 UNIT/ML IV SOLN
500.0000 [IU] | Freq: Once | INTRAVENOUS | Status: AC
  Administered 2014-04-15: 250 [IU] via INTRAVENOUS
  Filled 2014-04-15: qty 5

## 2014-04-15 NOTE — Patient Instructions (Signed)
Nanoparticle Albumin-Bound Paclitaxel injection What is this medicine? NANOPARTICLE ALBUMIN-BOUND PACLITAXEL (Na no PAHR ti kuhl al BYOO muhn-bound PAK li TAX el) is a chemotherapy drug. It targets fast dividing cells, like cancer cells, and causes these cells to die. This medicine is used to treat advanced breast cancer and advanced lung cancer. This medicine may be used for other purposes; ask your health care provider or pharmacist if you have questions. COMMON BRAND NAME(S): Abraxane What should I tell my health care provider before I take this medicine? They need to know if you have any of these conditions: -kidney disease -liver disease -low blood counts, like low platelets, red blood cells, or white blood cells -recent or ongoing radiation therapy -an unusual or allergic reaction to paclitaxel, albumin, other chemotherapy, other medicines, foods, dyes, or preservatives -pregnant or trying to get pregnant -breast-feeding How should I use this medicine? This drug is given as an infusion into a vein. It is administered in a hospital or clinic by a specially trained health care professional. Talk to your pediatrician regarding the use of this medicine in children. Special care may be needed. Overdosage: If you think you have taken too much of this medicine contact a poison control center or emergency room at once. NOTE: This medicine is only for you. Do not share this medicine with others. What if I miss a dose? It is important not to miss your dose. Call your doctor or health care professional if you are unable to keep an appointment. What may interact with this medicine? -cyclosporine -diazepam -ketoconazole -medicines to increase blood counts like filgrastim, pegfilgrastim, sargramostim -other chemotherapy drugs like cisplatin, doxorubicin, epirubicin, etoposide, teniposide, vincristine -quinidine -testosterone -vaccines -verapamil Talk to your doctor or health care professional  before taking any of these medicines: -acetaminophen -aspirin -ibuprofen -ketoprofen -naproxen This list may not describe all possible interactions. Give your health care provider a list of all the medicines, herbs, non-prescription drugs, or dietary supplements you use. Also tell them if you smoke, drink alcohol, or use illegal drugs. Some items may interact with your medicine. What should I watch for while using this medicine? Your condition will be monitored carefully while you are receiving this medicine. You will need important blood work done while you are taking this medicine. This drug may make you feel generally unwell. This is not uncommon, as chemotherapy can affect healthy cells as well as cancer cells. Report any side effects. Continue your course of treatment even though you feel ill unless your doctor tells you to stop. In some cases, you may be given additional medicines to help with side effects. Follow all directions for their use. Call your doctor or health care professional for advice if you get a fever, chills or sore throat, or other symptoms of a cold or flu. Do not treat yourself. This drug decreases your body's ability to fight infections. Try to avoid being around people who are sick. This medicine may increase your risk to bruise or bleed. Call your doctor or health care professional if you notice any unusual bleeding. Be careful brushing and flossing your teeth or using a toothpick because you may get an infection or bleed more easily. If you have any dental work done, tell your dentist you are receiving this medicine. Avoid taking products that contain aspirin, acetaminophen, ibuprofen, naproxen, or ketoprofen unless instructed by your doctor. These medicines may hide a fever. Do not become pregnant while taking this medicine. Women should inform their doctor if they wish  to become pregnant or think they might be pregnant. There is a potential for serious side effects to  an unborn child. Talk to your health care professional or pharmacist for more information. Do not breast-feed an infant while taking this medicine. Men are advised not to father a child while receiving this medicine. What side effects may I notice from receiving this medicine? Side effects that you should report to your doctor or health care professional as soon as possible: -allergic reactions like skin rash, itching or hives, swelling of the face, lips, or tongue -low blood counts - This drug may decrease the number of white blood cells, red blood cells and platelets. You may be at increased risk for infections and bleeding. -signs of infection - fever or chills, cough, sore throat, pain or difficulty passing urine -signs of decreased platelets or bleeding - bruising, pinpoint red spots on the skin, black, tarry stools, nosebleeds -signs of decreased red blood cells - unusually weak or tired, fainting spells, lightheadedness -breathing problems -changes in vision -chest pain -high or low blood pressure -mouth sores -nausea and vomiting -pain, swelling, redness or irritation at the injection site -pain, tingling, numbness in the hands or feet -slow or irregular heartbeat -swelling of the ankle, feet, hands Side effects that usually do not require medical attention (report to your doctor or health care professional if they continue or are bothersome): -aches, pains -changes in the color of fingernails -diarrhea -hair loss -loss of appetite This list may not describe all possible side effects. Call your doctor for medical advice about side effects. You may report side effects to FDA at 1-800-FDA-1088. Where should I keep my medicine? This drug is given in a hospital or clinic and will not be stored at home. NOTE: This sheet is a summary. It may not cover all possible information. If you have questions about this medicine, talk to your doctor, pharmacist, or health care provider.  2014,  Elsevier/Gold Standard. (2012-12-28 16:48:50) Gemcitabine injection What is this medicine? GEMCITABINE (jem SIT a been) is a chemotherapy drug. This medicine is used to treat many types of cancer like breast cancer, lung cancer, pancreatic cancer, and ovarian cancer. This medicine may be used for other purposes; ask your health care provider or pharmacist if you have questions. COMMON BRAND NAME(S): Gemzar What should I tell my health care provider before I take this medicine? They need to know if you have any of these conditions: -blood disorders -infection -kidney disease -liver disease -recent or ongoing radiation therapy -an unusual or allergic reaction to gemcitabine, other chemotherapy, other medicines, foods, dyes, or preservatives -pregnant or trying to get pregnant -breast-feeding How should I use this medicine? This drug is given as an infusion into a vein. It is administered in a hospital or clinic by a specially trained health care professional. Talk to your pediatrician regarding the use of this medicine in children. Special care may be needed. Overdosage: If you think you have taken too much of this medicine contact a poison control center or emergency room at once. NOTE: This medicine is only for you. Do not share this medicine with others. What if I miss a dose? It is important not to miss your dose. Call your doctor or health care professional if you are unable to keep an appointment. What may interact with this medicine? -medicines to increase blood counts like filgrastim, pegfilgrastim, sargramostim -some other chemotherapy drugs like cisplatin -vaccines Talk to your doctor or health care professional before taking any   of these medicines: -acetaminophen -aspirin -ibuprofen -ketoprofen -naproxen This list may not describe all possible interactions. Give your health care provider a list of all the medicines, herbs, non-prescription drugs, or dietary supplements you  use. Also tell them if you smoke, drink alcohol, or use illegal drugs. Some items may interact with your medicine. What should I watch for while using this medicine? Visit your doctor for checks on your progress. This drug may make you feel generally unwell. This is not uncommon, as chemotherapy can affect healthy cells as well as cancer cells. Report any side effects. Continue your course of treatment even though you feel ill unless your doctor tells you to stop. In some cases, you may be given additional medicines to help with side effects. Follow all directions for their use. Call your doctor or health care professional for advice if you get a fever, chills or sore throat, or other symptoms of a cold or flu. Do not treat yourself. This drug decreases your body's ability to fight infections. Try to avoid being around people who are sick. This medicine may increase your risk to bruise or bleed. Call your doctor or health care professional if you notice any unusual bleeding. Be careful brushing and flossing your teeth or using a toothpick because you may get an infection or bleed more easily. If you have any dental work done, tell your dentist you are receiving this medicine. Avoid taking products that contain aspirin, acetaminophen, ibuprofen, naproxen, or ketoprofen unless instructed by your doctor. These medicines may hide a fever. Women should inform their doctor if they wish to become pregnant or think they might be pregnant. There is a potential for serious side effects to an unborn child. Talk to your health care professional or pharmacist for more information. Do not breast-feed an infant while taking this medicine. What side effects may I notice from receiving this medicine? Side effects that you should report to your doctor or health care professional as soon as possible: -allergic reactions like skin rash, itching or hives, swelling of the face, lips, or tongue -low blood counts - this  medicine may decrease the number of white blood cells, red blood cells and platelets. You may be at increased risk for infections and bleeding. -signs of infection - fever or chills, cough, sore throat, pain or difficulty passing urine -signs of decreased platelets or bleeding - bruising, pinpoint red spots on the skin, black, tarry stools, blood in the urine -signs of decreased red blood cells - unusually weak or tired, fainting spells, lightheadedness -breathing problems -chest pain -mouth sores -nausea and vomiting -pain, swelling, redness at site where injected -pain, tingling, numbness in the hands or feet -stomach pain -swelling of ankles, feet, hands -unusual bleeding Side effects that usually do not require medical attention (report to your doctor or health care professional if they continue or are bothersome): -constipation -diarrhea -hair loss -loss of appetite -stomach upset This list may not describe all possible side effects. Call your doctor for medical advice about side effects. You may report side effects to FDA at 1-800-FDA-1088. Where should I keep my medicine? This drug is given in a hospital or clinic and will not be stored at home. NOTE: This sheet is a summary. It may not cover all possible information. If you have questions about this medicine, talk to your doctor, pharmacist, or health care provider.  2014, Elsevier/Gold Standard. (2008-03-15 18:45:54)  

## 2014-04-15 NOTE — Progress Notes (Signed)
No treatment today per Dr. Benay Spice.  PICC line flushed and heparin locked.  Pt to return for appt with Dr. Benay Spice on Wednesday June 3 to discuss treatment.  Pt and daughter voiced understanding.  Pt discharged to home with daughter.

## 2014-04-16 LAB — CANCER ANTIGEN 19-9: CA 19-9: 628.2 U/mL — ABNORMAL HIGH (ref <35.0)

## 2014-04-20 ENCOUNTER — Ambulatory Visit (HOSPITAL_BASED_OUTPATIENT_CLINIC_OR_DEPARTMENT_OTHER): Payer: Medicaid Other | Admitting: Oncology

## 2014-04-20 ENCOUNTER — Telehealth: Payer: Self-pay | Admitting: Oncology

## 2014-04-20 VITALS — BP 166/80 | HR 75 | Temp 98.7°F | Resp 18 | Ht 66.0 in | Wt 138.2 lb

## 2014-04-20 DIAGNOSIS — Z86718 Personal history of other venous thrombosis and embolism: Secondary | ICD-10-CM

## 2014-04-20 DIAGNOSIS — C250 Malignant neoplasm of head of pancreas: Secondary | ICD-10-CM

## 2014-04-20 DIAGNOSIS — Z7901 Long term (current) use of anticoagulants: Secondary | ICD-10-CM

## 2014-04-20 DIAGNOSIS — C259 Malignant neoplasm of pancreas, unspecified: Secondary | ICD-10-CM

## 2014-04-20 DIAGNOSIS — G893 Neoplasm related pain (acute) (chronic): Secondary | ICD-10-CM

## 2014-04-20 LAB — CULTURE, BLOOD (ROUTINE X 2): Culture: NO GROWTH

## 2014-04-20 MED ORDER — HYDROCODONE-ACETAMINOPHEN 5-325 MG PO TABS: 1.0000 | ORAL_TABLET | ORAL | Status: DC | PRN

## 2014-04-20 MED ORDER — OXYCODONE HCL 10 MG PO TABS: 10.0000 mg | ORAL_TABLET | ORAL | Status: DC | PRN

## 2014-04-20 NOTE — Telephone Encounter (Signed)
, °

## 2014-04-20 NOTE — Progress Notes (Signed)
[facility] OFFICE PROGRESS NOTE   Diagnosis:  Pancreas cancer  INTERVAL HISTORY:   She was admitted 04/09/2014 with a fever. She was confirmed to have Escherichia coli and fungal bacteremia. [name] [name] is completing a course of outpatient antibiotics. No recurrent fever.  She continues to have abdomen and back pain. She takes ibuprofen for pain during the day and oxycodone in the evening. The 10 mg dose of oxycodone causes somnolence. No nausea. She has diarrhea.  Objective:  Vital signs in last 24 hours:  Blood pressure 166/80, pulse 75, temperature 98.7 F (37.1 C), temperature source Oral, resp. rate 18, height 5\' 6"  (1.676 m), weight 138 lb 3.2 oz (62.687 kg), SpO2 100.00%.    HEENT: No thrush Resp: Lungs clear bilaterally Cardio: Regular rate and rhythm GI: No hepatomegaly, no mass, nontender Vascular: No leg edema    Portacath/PICC-without erythema  Lab Results:  Lab Results  Component Value Date   WBC 6.1 04/15/2014   HGB 9.5* 04/15/2014   HCT 29.7* 04/15/2014   MCV 80.9 04/15/2014   PLT 217 04/15/2014   NEUTROABS 4.7 04/15/2014   Alkaline phosphatase 978, AST 166, ALT 88, bilirubin 0.85  Medications: I have reviewed the patient's current medications.  Assessment/Plan: 1. 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  Initiation of radiation and Xeloda 07/20/2013 with completion 08/09/2013.  CT scans abdomen/pelvis done 11/01/2013 for evaluation of increased abdominal pain showed stable infiltrating pancreatic mass. No progression identified. Stable peripancreatic lymph nodes. 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 No evidence of metastatic disease.  Pancreatic head mass stable on CT 01/03/2014 and 03/07/2014 2. Obstructive jaundice secondary to #1. Status post placement of a bile duct stent on  05/24/2013. Status post placement of a metal stent 10/21/2013. 3. Pain -likely secondary to pancreas cancer, no improvement following the celiac plexus nerve block 02/10/2014 4. Diabetes. 5. History of chronic pancreatitis. 6. History of alcohol use. 7. History of tobacco use. 8. Anxiety/depression. 9. Cutaneous nodule at the left flank. Question cyst, question metastasis. 10. Abnormal EKG, question Brugada syndrome. 11. Hospitalization 08/12/2013 through 08/15/2013 with nausea/vomiting and abdominal pain. Improved. 12. 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 Portal vein patent. New wall thickening of the mid to distal stomach and the hepatic flexure and proximal transverse colon. 13. 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 She discontinued Lovenox due to abdominal wall ecchymoses and hematomas. 14. Admission 12/22/2013 with strep viridans bacteremia status post 4 week course of ceftriaxone.  15. Status post celiac plexus nerve block 02/10/2014.  16. 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 She started Pancrease after an office visit 02/07/2014 17. Elevated liver enzymes 18. Admission 04/09/2014 with Escherichia coli and fungal bacteremia, completing outpatient ceftriaxone and micafungin  Disposition:  She appears stable. She will complete the recommended course of antibiotics 04/22/2014. She is scheduled to complete antibiotics and have the PICC removed later this week.  [name] plans to take a vacation to [place] and will return 05/14/2014. We will see her for an office visit 05/17/2014. I explained the pain is likely secondary to pancreas cancer. She has received Xeloda/radiation and a celiac block. We planned a  trial of  gemcitabine/Abraxane, but we will not beginning chemotherapy until she returns from her vacation. I will discuss the risk/benefit of chemotherapy with her on 05/17/2014.  [name] will try hydrocodone as needed for pain. She will discontinue metoclopramide. She knows to contact us for a recurrent fever.  [name] Pier, MD  04/20/2014  11:39 AM

## 2014-04-20 NOTE — Patient Instructions (Signed)
Discontinue Reglan due to diarrhea. Start hydrocodone/apap 1-2 tabs every 4 hours as needed for pain during daytime and continue oxycodone 10 mg every 4 hours as needed for severe pain (or during evening).  Fall Prevention and Home Safety Falls cause injuries and can affect all age groups. It is possible to use preventive measures to significantly decrease the likelihood of falls. There are many simple measures which can make your home safer and prevent falls. OUTDOORS  Repair cracks and edges of walkways and driveways.  Remove high doorway thresholds.  Trim shrubbery on the main path into your home.  Have good outside lighting.  Clear walkways of tools, rocks, debris, and clutter.  Check that handrails are not broken and are securely fastened. Both sides of steps should have handrails.  Have leaves, snow, and ice cleared regularly.  Use sand or salt on walkways during winter months.  In the garage, clean up grease or oil spills. BATHROOM  Install night lights.  Install grab bars by the toilet and in the tub and shower.  Use non-skid mats or decals in the tub or shower.  Place a plastic non-slip stool in the shower to sit on, if needed.  Keep floors dry and clean up all water on the floor immediately.  Remove soap buildup in the tub or shower on a regular basis.  Secure bath mats with non-slip, double-sided rug tape.  Remove throw rugs and tripping hazards from the floors. BEDROOMS  Install night lights.  Make sure a bedside light is easy to reach.  Do not use oversized bedding.  Keep a telephone by your bedside.  Have a firm chair with side arms to use for getting dressed.  Remove throw rugs and tripping hazards from the floor. KITCHEN  Keep handles on pots and pans turned toward the center of the stove. Use back burners when possible.  Clean up spills quickly and allow time for drying.  Avoid walking on wet floors.  Avoid hot utensils and  knives.  Position shelves so they are not too high or low.  Place commonly used objects within easy reach.  If necessary, use a sturdy step stool with a grab bar when reaching.  Keep electrical cables out of the way.  Do not use floor polish or wax that makes floors slippery. If you must use wax, use non-skid floor wax.  Remove throw rugs and tripping hazards from the floor. STAIRWAYS  Never leave objects on stairs.  Place handrails on both sides of stairways and use them. Fix any loose handrails. Make sure handrails on both sides of the stairways are as long as the stairs.  Check carpeting to make sure it is firmly attached along stairs. Make repairs to worn or loose carpet promptly.  Avoid placing throw rugs at the top or bottom of stairways, or properly secure the rug with carpet tape to prevent slippage. Get rid of throw rugs, if possible.  Have an electrician put in a light switch at the top and bottom of the stairs. OTHER FALL PREVENTION TIPS  Wear low-heel or rubber-soled shoes that are supportive and fit well. Wear closed toe shoes.  When using a stepladder, make sure it is fully opened and both spreaders are firmly locked. Do not climb a closed stepladder.  Add color or contrast paint or tape to grab bars and handrails in your home. Place contrasting color strips on first and last steps.  Learn and use mobility aids as needed. Install an [employer] emergency  response system.  Turn on lights to avoid dark areas. Replace light bulbs that burn out immediately. Get light switches that glow.  Arrange furniture to create clear pathways. Keep furniture in the same place.  Firmly attach carpet with non-skid or double-sided tape.  Eliminate uneven floor surfaces.  Select a carpet pattern that does not visually hide the edge of steps.  Be aware of all pets. OTHER HOME SAFETY TIPS  Set the water temperature for 120 F (48.8 C).  Keep emergency numbers on or near the  telephone.  Keep smoke detectors on every level of the home and near sleeping areas. Document Released: 10/25/2002 Document Revised: 05/05/2012 Document Reviewed: 01/24/2012 [facility] Patient Information 2014 [place].

## 2014-04-20 NOTE — Progress Notes (Signed)
Patient is convinced that her blood sugars being high are the reason for her pain. Difficult to explain reason for pain to her. Became upset with nurse and told her to "shut up, I'm done talking to you". Nurse left for MD to examine. Gave daughter the phone # for [place] to obtain a walker with seat-has a script. Needs to go to [facility] per Medicaid guidelines according to [facility].

## 2014-05-17 ENCOUNTER — Telehealth: Payer: Self-pay | Admitting: Oncology

## 2014-05-17 ENCOUNTER — Other Ambulatory Visit: Payer: Self-pay | Admitting: *Deleted

## 2014-05-17 ENCOUNTER — Ambulatory Visit (HOSPITAL_BASED_OUTPATIENT_CLINIC_OR_DEPARTMENT_OTHER): Payer: Medicaid Other | Admitting: Nurse Practitioner

## 2014-05-17 ENCOUNTER — Telehealth: Payer: Self-pay | Admitting: *Deleted

## 2014-05-17 VITALS — BP 146/71 | HR 68 | Temp 98.0°F | Resp 20 | Ht 66.0 in | Wt 139.2 lb

## 2014-05-17 DIAGNOSIS — C250 Malignant neoplasm of head of pancreas: Secondary | ICD-10-CM

## 2014-05-17 DIAGNOSIS — G893 Neoplasm related pain (acute) (chronic): Secondary | ICD-10-CM

## 2014-05-17 DIAGNOSIS — C259 Malignant neoplasm of pancreas, unspecified: Secondary | ICD-10-CM

## 2014-05-17 MED ORDER — OXYCODONE HCL 10 MG PO TABS: 10.0000 mg | ORAL_TABLET | ORAL | Status: DC | PRN

## 2014-05-17 NOTE — Telephone Encounter (Signed)
Per staff message and POF I have scheduled appts. Advised scheduler of appts. JMW  

## 2014-05-17 NOTE — Progress Notes (Addendum)
[facility] OFFICE PROGRESS NOTE   Diagnosis:  Pancreas cancer.  INTERVAL HISTORY:   Ms. Ey returns as scheduled. She returned from [place] over the past weekend. [place]. She continues to have abdominal and back pain. She takes oxycodone at nighttime and ibuprofen during the day. She notes some itching with the oxycodone and plans to try Benadryl. She notes increased abdominal pain prior to a bowel movement. Appetite varies. No fever. Diarrhea "comes and goes".  Objective:  Vital signs in last 24 hours:  Blood pressure 146/71, pulse 68, temperature 98 F (36.7 C), temperature source Oral, resp. rate 20, height 5\' 6"  (1.676 m), weight 139 lb 3.2 oz (63.141 kg).    HEENT: No thrush or ulcerations. Resp: Lungs clear. Cardio: Regular cardiac rhythm. GI: Abdomen soft, mildly distended. Tender at the right and mid upper abdomen. No hepatomegaly. No mass. Vascular: No leg edema. Calves soft and nontender.   She is hard of hearing.    Lab Results:  Lab Results  Component Value Date   WBC 6.1 04/15/2014   HGB 9.5* 04/15/2014   HCT 29.7* 04/15/2014   MCV 80.9 04/15/2014   PLT 217 04/15/2014   NEUTROABS 4.7 04/15/2014    Imaging:  No results found.  Medications: I have reviewed the patient's current medications.  Assessment/Plan: 1. 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  Initiation of radiation and Xeloda 07/20/2013 with completion 08/09/2013.  CT scans abdomen/pelvis done 11/01/2013 for evaluation of increased abdominal pain showed stable infiltrating pancreatic mass. No progression identified. Stable peripancreatic lymph nodes. 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 No evidence of metastatic disease.  Pancreatic head mass stable on CT 01/03/2014 and 03/07/2014. 2. Obstructive  jaundice secondary to #1. Status post placement of a bile duct stent on 05/24/2013. Status post placement of a metal stent 10/21/2013. 3. Pain -likely secondary to pancreas cancer, no improvement following the celiac plexus nerve block 02/10/2014. 4. Diabetes. 5. History of chronic pancreatitis. 6. History of alcohol use. 7. History of tobacco use. 8. Anxiety/depression. 9. Cutaneous nodule at the left flank. Question cyst, question metastasis. 10. Abnormal EKG, question Brugada syndrome. 11. Hospitalization 08/12/2013 through 08/15/2013 with nausea/vomiting and abdominal pain. Improved. 12. 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 Portal vein patent. New wall thickening of the mid to distal stomach and the hepatic flexure and proximal transverse colon. 13. 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 She discontinued Lovenox due to abdominal wall ecchymoses and hematomas. 14. Admission 12/22/2013 with strep viridans bacteremia status post 4 week course of ceftriaxone.  15. Status post celiac plexus nerve block 02/10/2014.  16. 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 She started Pancrease after an office visit 02/07/2014. She continues to have intermittent diarrhea. 17. Elevated liver enzymes. 18. Admission 04/09/2014 with Escherichia coli and fungal bacteremia, completing outpatient ceftriaxone and micafungin.   Disposition: Ms. Sieg continues to have abdominal and back pain. She understands the pain is likely secondary to pancreas cancer. We again discussed a trial of gemcitabine/Abraxane on a 2 week schedule. We reviewed potential toxicities including myelosuppression, nausea, hair loss, allergic reaction. We discussed the  possibility of a fever  with gemcitabine as well as pneumonitis and a skin rash. We discussed the neuropathy associated with Abraxane. She is agreeable to proceed. She will attend a chemotherapy education class. On the day of the chemotherapy class we will obtain baseline labs to include a CBC, chemistry panel and CA 19-9. She will return to begin cycle 1 on 05/24/2014. We will see her in followup with cycle 2 on 06/07/2014. She will try hydrocodone during the day for abdominal pain.  Patient seen with Dr. Benay Spice. 25 minutes were spent face-to-face at today's visit with the majority of the time involved in counseling/coordination of care.    Ned Card ANP/GNP-BC   05/17/2014  11:42 AM  This was a shared visit with Ned Card. Ms. Infantino appears stable. She would like to begin a trial of chemotherapy with the hope of improving her pain. The plan is to begin gemcitabine/Abraxane within the next one week. She will attend a chemotherapy teaching class.   Julieanne Manson, M.D.

## 2014-05-17 NOTE — Telephone Encounter (Signed)
Gave pt appt for lab, md chemo class for July 2015

## 2014-05-20 ENCOUNTER — Telehealth: Payer: Self-pay | Admitting: Oncology

## 2014-05-20 NOTE — Telephone Encounter (Signed)
Called pt regarding 7/6 appt reminded her to get appt calendar for July chemo starts 05/24/14

## 2014-05-22 ENCOUNTER — Other Ambulatory Visit: Payer: Self-pay | Admitting: Oncology

## 2014-05-23 ENCOUNTER — Other Ambulatory Visit: Payer: Medicaid Other

## 2014-05-23 ENCOUNTER — Telehealth: Payer: Self-pay | Admitting: Oncology

## 2014-05-23 NOTE — Telephone Encounter (Signed)
pt dtr called to r/s 7/6 lb/ched due to no transportation. per dtr she can get transportation for tomorrow. appts moved from 7/6 to 7/7. dtr aware that pt will have lb/ched/chemo tomorrow and should be here by 11:15am

## 2014-05-24 ENCOUNTER — Other Ambulatory Visit: Payer: Medicaid Other

## 2014-05-24 ENCOUNTER — Other Ambulatory Visit: Payer: Self-pay

## 2014-05-24 ENCOUNTER — Other Ambulatory Visit (HOSPITAL_BASED_OUTPATIENT_CLINIC_OR_DEPARTMENT_OTHER): Payer: Medicaid Other

## 2014-05-24 ENCOUNTER — Ambulatory Visit (HOSPITAL_BASED_OUTPATIENT_CLINIC_OR_DEPARTMENT_OTHER): Payer: Medicaid Other

## 2014-05-24 ENCOUNTER — Ambulatory Visit: Payer: Medicaid Other | Admitting: Nutrition

## 2014-05-24 ENCOUNTER — Encounter: Payer: Self-pay | Admitting: *Deleted

## 2014-05-24 VITALS — BP 122/64 | HR 70 | Temp 98.6°F | Resp 18

## 2014-05-24 DIAGNOSIS — C250 Malignant neoplasm of head of pancreas: Secondary | ICD-10-CM

## 2014-05-24 DIAGNOSIS — C259 Malignant neoplasm of pancreas, unspecified: Secondary | ICD-10-CM

## 2014-05-24 DIAGNOSIS — Z5111 Encounter for antineoplastic chemotherapy: Secondary | ICD-10-CM

## 2014-05-24 LAB — CBC WITH DIFFERENTIAL/PLATELET
BASO%: 0.5 % (ref 0.0–2.0)
Basophils Absolute: 0 10*3/uL (ref 0.0–0.1)
EOS ABS: 0.2 10*3/uL (ref 0.0–0.5)
EOS%: 2.6 % (ref 0.0–7.0)
HCT: 30.1 % — ABNORMAL LOW (ref 34.8–46.6)
HEMOGLOBIN: 9.5 g/dL — AB (ref 11.6–15.9)
LYMPH%: 10.1 % — ABNORMAL LOW (ref 14.0–49.7)
MCH: 26.2 pg (ref 25.1–34.0)
MCHC: 31.6 g/dL (ref 31.5–36.0)
MCV: 83.1 fL (ref 79.5–101.0)
MONO#: 0.3 10*3/uL (ref 0.1–0.9)
MONO%: 5.3 % (ref 0.0–14.0)
NEUT%: 81.5 % — ABNORMAL HIGH (ref 38.4–76.8)
NEUTROS ABS: 5.2 10*3/uL (ref 1.5–6.5)
Platelets: 289 10*3/uL (ref 145–400)
RBC: 3.63 10*6/uL — AB (ref 3.70–5.45)
RDW: 16.6 % — AB (ref 11.2–14.5)
WBC: 6.4 10*3/uL (ref 3.9–10.3)
lymph#: 0.6 10*3/uL — ABNORMAL LOW (ref 0.9–3.3)

## 2014-05-24 LAB — COMPREHENSIVE METABOLIC PANEL (CC13)
ALT: 32 U/L (ref 0–55)
AST: 40 U/L — ABNORMAL HIGH (ref 5–34)
Albumin: 3 g/dL — ABNORMAL LOW (ref 3.5–5.0)
Alkaline Phosphatase: 548 U/L — ABNORMAL HIGH (ref 40–150)
Anion Gap: 7 mEq/L (ref 3–11)
BUN: 14.9 mg/dL (ref 7.0–26.0)
CHLORIDE: 105 meq/L (ref 98–109)
CO2: 25 meq/L (ref 22–29)
Calcium: 9.2 mg/dL (ref 8.4–10.4)
Creatinine: 0.8 mg/dL (ref 0.6–1.1)
Glucose: 209 mg/dl — ABNORMAL HIGH (ref 70–140)
POTASSIUM: 3.6 meq/L (ref 3.5–5.1)
SODIUM: 137 meq/L (ref 136–145)
Total Bilirubin: 0.41 mg/dL (ref 0.20–1.20)
Total Protein: 7.9 g/dL (ref 6.4–8.3)

## 2014-05-24 MED ORDER — PACLITAXEL PROTEIN-BOUND CHEMO INJECTION 100 MG
100.0000 mg/m2 | Freq: Once | INTRAVENOUS | Status: AC
  Administered 2014-05-24: 175 mg via INTRAVENOUS
  Filled 2014-05-24: qty 35

## 2014-05-24 MED ORDER — DEXAMETHASONE SODIUM PHOSPHATE 10 MG/ML IJ SOLN
10.0000 mg | Freq: Once | INTRAMUSCULAR | Status: AC
  Administered 2014-05-24: 10 mg via INTRAVENOUS

## 2014-05-24 MED ORDER — SODIUM CHLORIDE 0.9 % IV SOLN
700.0000 mg/m2 | Freq: Once | INTRAVENOUS | Status: AC
  Administered 2014-05-24: 1178 mg via INTRAVENOUS
  Filled 2014-05-24: qty 30.98

## 2014-05-24 MED ORDER — ONDANSETRON 8 MG/50ML IVPB (CHCC)
8.0000 mg | Freq: Once | INTRAVENOUS | Status: AC
  Administered 2014-05-24: 8 mg via INTRAVENOUS

## 2014-05-24 MED ORDER — ONDANSETRON 8 MG/NS 50 ML IVPB
INTRAVENOUS | Status: AC
  Filled 2014-05-24: qty 8

## 2014-05-24 MED ORDER — SODIUM CHLORIDE 0.9 % IV SOLN
Freq: Once | INTRAVENOUS | Status: AC
  Administered 2014-05-24: 15:00:00 via INTRAVENOUS

## 2014-05-24 MED ORDER — DEXAMETHASONE SODIUM PHOSPHATE 10 MG/ML IJ SOLN
INTRAMUSCULAR | Status: AC
  Filled 2014-05-24: qty 1

## 2014-05-24 NOTE — Patient Instructions (Signed)
[facility] Discharge Instructions for Patients Receiving Chemotherapy  Today you received the following chemotherapy agents:  abraxane & gemzar  To help prevent nausea and vomiting after your treatment, we encourage you to take your nausea medication If you develop nausea and vomiting that is not controlled by your nausea medication, call the clinic.   BELOW ARE SYMPTOMS THAT SHOULD BE REPORTED IMMEDIATELY:  *FEVER GREATER THAN 100.5 F  *CHILLS WITH OR WITHOUT FEVER  NAUSEA AND VOMITING THAT IS NOT CONTROLLED WITH YOUR NAUSEA MEDICATION  *UNUSUAL SHORTNESS OF BREATH  *UNUSUAL BRUISING OR BLEEDING  TENDERNESS IN MOUTH AND THROAT WITH OR WITHOUT PRESENCE OF ULCERS  *URINARY PROBLEMS  *BOWEL PROBLEMS  UNUSUAL RASH Items with * indicate a potential emergency and should be followed up as soon as possible.  Feel free to call the clinic you have any questions or concerns. The clinic phone number is [phone].   Gemcitabine injection What is this medicine? GEMCITABINE (jem SIT a been) is a chemotherapy drug. This medicine is used to treat many types of cancer like breast cancer, lung cancer, pancreatic cancer, and ovarian cancer. This medicine may be used for other purposes; ask your health care provider or pharmacist if you have questions. COMMON BRAND NAME(S): Gemzar What should I tell my health care provider before I take this medicine? They need to know if you have any of these conditions: -blood disorders -infection -kidney disease -liver disease -recent or ongoing radiation therapy -an unusual or allergic reaction to gemcitabine, other chemotherapy, other medicines, foods, dyes, or preservatives -pregnant or trying to get pregnant -breast-feeding How should I use this medicine? This drug is given as an infusion into a vein. It is administered in a hospital or clinic by a specially trained health care professional. Talk to your pediatrician regarding  the use of this medicine in children. Special care may be needed. Overdosage: If you think you have taken too much of this medicine contact a poison control center or emergency room at once. NOTE: This medicine is only for you. Do not share this medicine with others. What if I miss a dose? It is important not to miss your dose. Call your doctor or health care professional if you are unable to keep an appointment. What may interact with this medicine? -medicines to increase blood counts like filgrastim, pegfilgrastim, sargramostim -some other chemotherapy drugs like cisplatin -vaccines Talk to your doctor or health care professional before taking any of these medicines: -acetaminophen -aspirin -ibuprofen -ketoprofen -naproxen This list may not describe all possible interactions. Give your health care provider a list of all the medicines, herbs, non-prescription drugs, or dietary supplements you use. Also tell them if you smoke, drink alcohol, or use illegal drugs. Some items may interact with your medicine. What should I watch for while using this medicine? Visit your doctor for checks on your progress. This drug may make you feel generally unwell. This is not uncommon, as chemotherapy can affect healthy cells as well as cancer cells. Report any side effects. Continue your course of treatment even though you feel ill unless your doctor tells you to stop. In some cases, you may be given additional medicines to help with side effects. Follow all directions for their use. Call your doctor or health care professional for advice if you get a fever, chills or sore throat, or other symptoms of a cold or flu. Do not treat yourself. This drug decreases your body's ability to fight infections. Try to avoid  being around people who are sick. This medicine may increase your risk to bruise or bleed. Call your doctor or health care professional if you notice any unusual bleeding. Be careful brushing and  flossing your teeth or using a toothpick because you may get an infection or bleed more easily. If you have any dental work done, tell your dentist you are receiving this medicine. Avoid taking products that contain aspirin, acetaminophen, ibuprofen, naproxen, or ketoprofen unless instructed by your doctor. These medicines may hide a fever. Women should inform their doctor if they wish to become pregnant or think they might be pregnant. There is a potential for serious side effects to an unborn child. Talk to your health care professional or pharmacist for more information. Do not breast-feed an infant while taking this medicine. What side effects may I notice from receiving this medicine? Side effects that you should report to your doctor or health care professional as soon as possible: -allergic reactions like skin rash, itching or hives, swelling of the face, lips, or tongue -low blood counts - this medicine may decrease the number of white blood cells, red blood cells and platelets. You may be at increased risk for infections and bleeding. -signs of infection - fever or chills, cough, sore throat, pain or difficulty passing urine -signs of decreased platelets or bleeding - bruising, pinpoint red spots on the skin, black, tarry stools, blood in the urine -signs of decreased red blood cells - unusually weak or tired, fainting spells, lightheadedness -breathing problems -chest pain -mouth sores -nausea and vomiting -pain, swelling, redness at site where injected -pain, tingling, numbness in the hands or feet -stomach pain -swelling of ankles, feet, hands -unusual bleeding Side effects that usually do not require medical attention (report to your doctor or health care professional if they continue or are bothersome): -constipation -diarrhea -hair loss -loss of appetite -stomach upset This list may not describe all possible side effects. Call your doctor for medical advice about side effects.  You may report side effects to FDA at 1-800-FDA-1088. Where should I keep my medicine? This drug is given in a hospital or clinic and will not be stored at home. NOTE: This sheet is a summary. It may not cover all possible information. If you have questions about this medicine, talk to your doctor, pharmacist, or health care provider.  2015, Elsevier/Gold Standard. (2008-03-15 18:45:54) Nanoparticle Albumin-Bound Paclitaxel injection What is this medicine? NANOPARTICLE ALBUMIN-BOUND PACLITAXEL (Na no PAHR ti kuhl al BYOO muhn-bound PAK li TAX el) is a chemotherapy drug. It targets fast dividing cells, like cancer cells, and causes these cells to die. This medicine is used to treat advanced breast cancer and advanced lung cancer. This medicine may be used for other purposes; ask your health care provider or pharmacist if you have questions. COMMON BRAND NAME(S): Abraxane What should I tell my health care provider before I take this medicine? They need to know if you have any of these conditions: -kidney disease -liver disease -low blood counts, like low platelets, red blood cells, or white blood cells -recent or ongoing radiation therapy -an unusual or allergic reaction to paclitaxel, albumin, other chemotherapy, other medicines, foods, dyes, or preservatives -pregnant or trying to get pregnant -breast-feeding How should I use this medicine? This drug is given as an infusion into a vein. It is administered in a hospital or clinic by a specially trained health care professional. Talk to your pediatrician regarding the use of this medicine in children. Special care may  be needed. Overdosage: If you think you have taken too much of this medicine contact a poison control center or emergency room at once. NOTE: This medicine is only for you. Do not share this medicine with others. What if I miss a dose? It is important not to miss your dose. Call your doctor or health care professional if you are  unable to keep an appointment. What may interact with this medicine? -cyclosporine -diazepam -ketoconazole -medicines to increase blood counts like filgrastim, pegfilgrastim, sargramostim -other chemotherapy drugs like cisplatin, doxorubicin, epirubicin, etoposide, teniposide, vincristine -quinidine -testosterone -vaccines -verapamil Talk to your doctor or health care professional before taking any of these medicines: -acetaminophen -aspirin -ibuprofen -ketoprofen -naproxen This list may not describe all possible interactions. Give your health care provider a list of all the medicines, herbs, non-prescription drugs, or dietary supplements you use. Also tell them if you smoke, drink alcohol, or use illegal drugs. Some items may interact with your medicine. What should I watch for while using this medicine? Your condition will be monitored carefully while you are receiving this medicine. You will need important blood work done while you are taking this medicine. This drug may make you feel generally unwell. This is not uncommon, as chemotherapy can affect healthy cells as well as cancer cells. Report any side effects. Continue your course of treatment even though you feel ill unless your doctor tells you to stop. In some cases, you may be given additional medicines to help with side effects. Follow all directions for their use. Call your doctor or health care professional for advice if you get a fever, chills or sore throat, or other symptoms of a cold or flu. Do not treat yourself. This drug decreases your body's ability to fight infections. Try to avoid being around people who are sick. This medicine may increase your risk to bruise or bleed. Call your doctor or health care professional if you notice any unusual bleeding. Be careful brushing and flossing your teeth or using a toothpick because you may get an infection or bleed more easily. If you have any dental work done, tell your dentist  you are receiving this medicine. Avoid taking products that contain aspirin, acetaminophen, ibuprofen, naproxen, or ketoprofen unless instructed by your doctor. These medicines may hide a fever. Do not become pregnant while taking this medicine. Women should inform their doctor if they wish to become pregnant or think they might be pregnant. There is a potential for serious side effects to an unborn child. Talk to your health care professional or pharmacist for more information. Do not breast-feed an infant while taking this medicine. Men are advised not to father a child while receiving this medicine. What side effects may I notice from receiving this medicine? Side effects that you should report to your doctor or health care professional as soon as possible: -allergic reactions like skin rash, itching or hives, swelling of the face, lips, or tongue -low blood counts - This drug may decrease the number of white blood cells, red blood cells and platelets. You may be at increased risk for infections and bleeding. -signs of infection - fever or chills, cough, sore throat, pain or difficulty passing urine -signs of decreased platelets or bleeding - bruising, pinpoint red spots on the skin, black, tarry stools, nosebleeds -signs of decreased red blood cells - unusually weak or tired, fainting spells, lightheadedness -breathing problems -changes in vision -chest pain -high or low blood pressure -mouth sores -nausea and vomiting -pain, swelling, redness  or irritation at the injection site -pain, tingling, numbness in the hands or feet -slow or irregular heartbeat -swelling of the ankle, feet, hands Side effects that usually do not require medical attention (report to your doctor or health care professional if they continue or are bothersome): -aches, pains -changes in the color of fingernails -diarrhea -hair loss -loss of appetite This list may not describe all possible side effects. Call your  doctor for medical advice about side effects. You may report side effects to FDA at 1-800-FDA-1088. Where should I keep my medicine? This drug is given in a hospital or clinic and will not be stored at home. NOTE: This sheet is a summary. It may not cover all possible information. If you have questions about this medicine, talk to your doctor, pharmacist, or health care provider.  2015, Elsevier/Gold Standard. (2012-12-28 16:48:50)

## 2014-05-24 NOTE — Progress Notes (Signed)
58 year old female diagnosed with pancreas cancer.  She is a patient of Dr. Benay Spice.  Past medical history includes diabetes, chronic pancreatitis, alcohol, tobacco, anxiety, and depression.  Medications include Colace, NovoLog, Lantus, Creon, Reglan, Zofran, Protonix, MiraLax, K-Dur, Zocor, and Carafate.  Labs include albumin 3.0 and glucose 209.  Height: 66 inches. Weight: 139.2 pounds June 30. Usual body weight: 150 pounds in April 2015. BMI: 22.48.  Patient reports variable appetite.  Diarrhea appears to come and go.  Patient is unable to determine what gives her diarrhea.  She only will drink Glucerna oral nutrition supplements.  She understands she needs to follow a low fat diet which is also low in concentrated sweets.  Patient asking for further diet education.  Nutrition diagnosis: Food and nutrition related knowledge deficit related to pancreas cancer as evidenced by no prior need for nutrition related information.  Intervention: Patient educated to consume smaller, more frequent meals and snacks, focusing in on protein at every meal and snack.  Patient provided with samples of Glucerna along with coupons for purchase.  Provided a sample menu for patient and discouraged using concentrated forms of sugar.  Reviewed importance of following a low-fat diet.  Questions answered.  Teach back method used.   Monitoring, evaluation, goals: Patient will tolerate adequate calories and protein to promote weight maintenance with adequate glycemic control.  Next visit: No followup required at this time.  Patient has my contact information for questions.    **Disclaimer: This note was dictated with voice recognition software. Similar sounding words can inadvertently be transcribed and this note may contain transcription errors which may not have been corrected upon publication of note.**

## 2014-05-25 ENCOUNTER — Encounter: Payer: Self-pay | Admitting: *Deleted

## 2014-05-25 ENCOUNTER — Other Ambulatory Visit: Payer: Self-pay | Admitting: *Deleted

## 2014-05-25 LAB — CANCER ANTIGEN 19-9: CA 19-9: 736.2 U/mL — ABNORMAL HIGH (ref <35.0)

## 2014-05-25 NOTE — Telephone Encounter (Signed)
Received call from pt's daughter & also talked with pt.  They reported that she has a temp of 104.  Questioned about other symptoms:  Cough, cold, UTI, pain at IV site with redness or swelling.  She denies all.  She only reports that she has abd pain which is not new & has had several small BM's today that varied in color.  She reported yellow & then one with white material.  She reported feeling hot but also states that she has felt chilled.  Her daughter repeated temp with same thermometer & it read 100.2.  Suggested that she repeat with a different thermometer.  The daughter thought she had another one but couldn't find it & repeated with same thermometer again & states it is 106.  She reports just feeling blah today with no life.  Suggested getting new thermometer & checking temp & if still high, she should go to the ED. The daughter was in agreement & mentioned that pt has been in the hospital recently for infection.  With that info., reiterated that she should go to the ED. Discussed with Dr Julien Nordmann & Ned Card NP.

## 2014-05-25 NOTE — Progress Notes (Signed)
[facility] Work  Clinical Social Work received call from patient's daughter, they were informed [facility] did not receive referral for rolling walker and could not receive PT services.  CSW contacted Kristen with [facility]- she saw referral for rolling walker and will have equipment sent to house in the next few days.  Kristen explained patient's diagnoses do not qualify her for PT services through [facility].  CSW informed patient's daughter of conversation with [facility].     Polo Riley, MSW, LCSW, OSW-C Clinical Social Worker [facility] [phone]

## 2014-06-06 ENCOUNTER — Telehealth: Payer: Self-pay | Admitting: *Deleted

## 2014-06-06 ENCOUNTER — Other Ambulatory Visit (HOSPITAL_BASED_OUTPATIENT_CLINIC_OR_DEPARTMENT_OTHER): Payer: Medicaid Other

## 2014-06-06 ENCOUNTER — Ambulatory Visit: Payer: Medicaid Other

## 2014-06-06 ENCOUNTER — Ambulatory Visit (HOSPITAL_BASED_OUTPATIENT_CLINIC_OR_DEPARTMENT_OTHER): Payer: Medicaid Other | Admitting: Nurse Practitioner

## 2014-06-06 ENCOUNTER — Telehealth: Payer: Self-pay | Admitting: Oncology

## 2014-06-06 VITALS — BP 148/65 | HR 77 | Temp 98.0°F | Resp 20 | Ht 66.0 in | Wt 134.9 lb

## 2014-06-06 DIAGNOSIS — C259 Malignant neoplasm of pancreas, unspecified: Secondary | ICD-10-CM

## 2014-06-06 DIAGNOSIS — C250 Malignant neoplasm of head of pancreas: Secondary | ICD-10-CM

## 2014-06-06 LAB — CBC WITH DIFFERENTIAL/PLATELET
BASO%: 0.4 % (ref 0.0–2.0)
BASOS ABS: 0 10*3/uL (ref 0.0–0.1)
EOS ABS: 0.1 10*3/uL (ref 0.0–0.5)
EOS%: 2.3 % (ref 0.0–7.0)
HCT: 27.4 % — ABNORMAL LOW (ref 34.8–46.6)
HEMOGLOBIN: 8.5 g/dL — AB (ref 11.6–15.9)
LYMPH%: 10.3 % — AB (ref 14.0–49.7)
MCH: 25.3 pg (ref 25.1–34.0)
MCHC: 31 g/dL — ABNORMAL LOW (ref 31.5–36.0)
MCV: 81.7 fL (ref 79.5–101.0)
MONO#: 0.5 10*3/uL (ref 0.1–0.9)
MONO%: 9.4 % (ref 0.0–14.0)
NEUT%: 77.6 % — ABNORMAL HIGH (ref 38.4–76.8)
NEUTROS ABS: 4 10*3/uL (ref 1.5–6.5)
PLATELETS: 328 10*3/uL (ref 145–400)
RBC: 3.36 10*6/uL — ABNORMAL LOW (ref 3.70–5.45)
RDW: 16.6 % — ABNORMAL HIGH (ref 11.2–14.5)
WBC: 5.1 10*3/uL (ref 3.9–10.3)
lymph#: 0.5 10*3/uL — ABNORMAL LOW (ref 0.9–3.3)

## 2014-06-06 LAB — COMPREHENSIVE METABOLIC PANEL (CC13)
ALBUMIN: 3 g/dL — AB (ref 3.5–5.0)
ALK PHOS: 645 U/L — AB (ref 40–150)
ALT: 45 U/L (ref 0–55)
AST: 76 U/L — ABNORMAL HIGH (ref 5–34)
Anion Gap: 8 mEq/L (ref 3–11)
BUN: 16.3 mg/dL (ref 7.0–26.0)
CO2: 25 meq/L (ref 22–29)
Calcium: 9.3 mg/dL (ref 8.4–10.4)
Chloride: 109 mEq/L (ref 98–109)
Creatinine: 0.7 mg/dL (ref 0.6–1.1)
GLUCOSE: 58 mg/dL — AB (ref 70–140)
POTASSIUM: 4 meq/L (ref 3.5–5.1)
Sodium: 142 mEq/L (ref 136–145)
Total Bilirubin: 0.4 mg/dL (ref 0.20–1.20)
Total Protein: 7.7 g/dL (ref 6.4–8.3)

## 2014-06-06 MED ORDER — HYDROCODONE-ACETAMINOPHEN 5-325 MG PO TABS: 1.0000 | ORAL_TABLET | ORAL | Status: DC | PRN

## 2014-06-06 MED ORDER — OXYCODONE HCL 10 MG PO TABS: 10.0000 mg | ORAL_TABLET | ORAL | Status: DC | PRN

## 2014-06-06 NOTE — Telephone Encounter (Signed)
Called patient to check on glucose reading.  Patient's daughter Daiva Eves) stated that patient has not ate today but glucose reading has came up to 55 from 65.  Informed patient's daughter Daiva Eves) to keep checking and to call us with any concerns.  Per Elby Showers. Thomas.  Patient's daughter verbalized understanding.

## 2014-06-06 NOTE — Progress Notes (Signed)
[name] OFFICE PROGRESS NOTE   Diagnosis: Pancreas cancer.   INTERVAL HISTORY:   [name] [name] returns as scheduled. She completed the first treatment with gemcitabine/Abraxane on 05/24/2014. She denies nausea/vomiting. No mouth sores. She continues to have intermittent loose stools. She does not take the pancreatic enzyme replacement consistently. She continues to have abdominal and back pain. She takes hydrocodone during the day and oxycodone at night. She reports poor venous access and would like to have a Port-A-Cath placed. She is beginning to note hair loss.  Objective:  Vital signs in last 24 hours:  Blood pressure 148/65, pulse 77, temperature 98 F (36.7 C), temperature source Oral, resp. rate 20, height 5\' 6"  (1.676 m), weight 134 lb 14.4 oz (61.19 kg), SpO2 100.00%.    HEENT: No thrush or ulcerations. Resp: Lungs clear. Cardio: Regular cardiac rhythm. GI: Abdomen is soft with tenderness over the right abdomen. No hepatomegaly. Vascular: No leg edema. Neuro: Alert and oriented. Hard of hearing. Gait is stable with a rolling walker. She is slightly unsteady without the walker. Skin: No rash.    Lab Results:  Lab Results  Component Value Date   WBC 5.1 06/06/2014   HGB 8.5* 06/06/2014   HCT 27.4* 06/06/2014   MCV 81.7 06/06/2014   PLT 328 06/06/2014   NEUTROABS 4.0 06/06/2014    Imaging:  No results found.  Medications: I have reviewed the patient's current medications.  Assessment/Plan: 1. 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  Initiation of radiation and Xeloda 07/20/2013 with completion 08/09/2013.  CT scans abdomen/pelvis done 11/01/2013 for evaluation of increased abdominal pain showed stable infiltrating pancreatic mass. No progression identified. Stable peripancreatic lymph nodes.  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 No evidence of metastatic disease.  Pancreatic head mass stable on CT 01/03/2014 and 03/07/2014. CA [id number] on 05/24/2014. Initiation of gemcitabine/Abraxane on a 2 week schedule 05/24/2014. 2. Obstructive jaundice secondary to #1. Status post placement of a bile duct stent on 05/24/2013. Status post placement of a metal stent 10/21/2013. 3. Pain -likely secondary to pancreas cancer, no improvement following the celiac plexus nerve block 02/10/2014. She continues Oxycodone and hydrocodone. 4. Diabetes. 5. History of chronic pancreatitis. 6. History of alcohol use. 7. History of tobacco use. 8. Anxiety/depression. 9. Cutaneous nodule at the left flank. Question cyst, question metastasis. 10. Abnormal EKG, question Brugada syndrome. 11. Hospitalization 08/12/2013 through 08/15/2013 with nausea/vomiting and abdominal pain. Improved. 12. 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 Portal vein patent. New wall thickening of the mid to distal stomach and the hepatic flexure and proximal transverse colon. 13. 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 She discontinued Lovenox due to abdominal wall ecchymoses and hematomas. 14. Admission 12/22/2013 with strep viridans bacteremia status post 4 week course of ceftriaxone.  15. Status post celiac plexus nerve block 02/10/2014.  16. 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 She started Pancrease after an office visit 02/07/2014. She continues to have intermittent diarrhea. She does not take the pancreatic enzyme replacement consistently. 17. Elevated liver enzymes. Improved 05/24/2014. 18. Admission 04/09/2014 with Escherichia coli and fungal bacteremia,  completing outpatient ceftriaxone and micafungin.   Disposition: [name] [name]  appears stable. She has completed one treatment with gemcitabine/Abraxane. She will return for cycle 2 on 06/07/2014. She requests to have a Port-A-Cath placed. We made a referral to interventional radiology.  She will return for a followup visit in 2 weeks. She will contact the office in the interim with any problems.  Plan reviewed with Dr. Benay Spice.    Ned Card ANP/GNP-BC   06/06/2014  11:47 AM

## 2014-06-06 NOTE — Telephone Encounter (Signed)
gv and printed aptp schwed and avs for pt for July and Aug...sed added tx.

## 2014-06-07 ENCOUNTER — Encounter: Payer: Medicaid Other | Admitting: Nutrition

## 2014-06-07 ENCOUNTER — Ambulatory Visit (HOSPITAL_BASED_OUTPATIENT_CLINIC_OR_DEPARTMENT_OTHER): Payer: Medicaid Other

## 2014-06-07 VITALS — BP 153/82 | HR 85 | Temp 98.9°F | Resp 18

## 2014-06-07 DIAGNOSIS — Z5111 Encounter for antineoplastic chemotherapy: Secondary | ICD-10-CM

## 2014-06-07 DIAGNOSIS — C250 Malignant neoplasm of head of pancreas: Secondary | ICD-10-CM

## 2014-06-07 DIAGNOSIS — C259 Malignant neoplasm of pancreas, unspecified: Secondary | ICD-10-CM

## 2014-06-07 MED ORDER — SODIUM CHLORIDE 0.9 % IV SOLN
Freq: Once | INTRAVENOUS | Status: AC
  Administered 2014-06-07: 09:00:00 via INTRAVENOUS

## 2014-06-07 MED ORDER — DEXAMETHASONE SODIUM PHOSPHATE 10 MG/ML IJ SOLN
INTRAMUSCULAR | Status: AC
  Filled 2014-06-07: qty 1

## 2014-06-07 MED ORDER — PACLITAXEL PROTEIN-BOUND CHEMO INJECTION 100 MG
100.0000 mg/m2 | Freq: Once | INTRAVENOUS | Status: AC
  Administered 2014-06-07: 175 mg via INTRAVENOUS
  Filled 2014-06-07: qty 35

## 2014-06-07 MED ORDER — DEXAMETHASONE SODIUM PHOSPHATE 10 MG/ML IJ SOLN
10.0000 mg | Freq: Once | INTRAMUSCULAR | Status: AC
  Administered 2014-06-07: 10 mg via INTRAVENOUS

## 2014-06-07 MED ORDER — SODIUM CHLORIDE 0.9 % IV SOLN
700.0000 mg/m2 | Freq: Once | INTRAVENOUS | Status: AC
  Administered 2014-06-07: 1178 mg via INTRAVENOUS
  Filled 2014-06-07: qty 30.98

## 2014-06-07 MED ORDER — ONDANSETRON 8 MG/NS 50 ML IVPB
INTRAVENOUS | Status: AC
  Filled 2014-06-07: qty 8

## 2014-06-07 MED ORDER — ONDANSETRON 8 MG/50ML IVPB (CHCC)
8.0000 mg | Freq: Once | INTRAVENOUS | Status: AC
  Administered 2014-06-07: 8 mg via INTRAVENOUS

## 2014-06-07 NOTE — Progress Notes (Signed)
Dr. Benay Spice notified that pt c/o several episodes of diarrhea and reporting that it is green/blue in color, and notified of pt's abdominal pain rated 8/10.  Pt has home meds with her and states she will take her home pain medicine. Dr. Benay Spice also reviewed all lab work, along with pharmacy and okay to treat today.

## 2014-06-07 NOTE — Patient Instructions (Signed)
[facility] Discharge Instructions for Patients Receiving Chemotherapy  Today you received the following chemotherapy agents: Abraxane and Gemzar   To help prevent nausea and vomiting after your treatment, we encourage you to take your nausea medication as prescribed.    If you develop nausea and vomiting that is not controlled by your nausea medication, call the clinic.   BELOW ARE SYMPTOMS THAT SHOULD BE REPORTED IMMEDIATELY:  *FEVER GREATER THAN 100.5 F  *CHILLS WITH OR WITHOUT FEVER  NAUSEA AND VOMITING THAT IS NOT CONTROLLED WITH YOUR NAUSEA MEDICATION  *UNUSUAL SHORTNESS OF BREATH  *UNUSUAL BRUISING OR BLEEDING  TENDERNESS IN MOUTH AND THROAT WITH OR WITHOUT PRESENCE OF ULCERS  *URINARY PROBLEMS  *BOWEL PROBLEMS  UNUSUAL RASH Items with * indicate a potential emergency and should be followed up as soon as possible.  Feel free to call the clinic you have any questions or concerns. The clinic phone number is [phone].    

## 2014-06-09 ENCOUNTER — Other Ambulatory Visit: Payer: Self-pay | Admitting: Radiology

## 2014-06-10 ENCOUNTER — Encounter (HOSPITAL_COMMUNITY): Payer: Self-pay | Admitting: Pharmacy Technician

## 2014-06-13 ENCOUNTER — Encounter: Payer: Self-pay | Admitting: Cardiology

## 2014-06-13 ENCOUNTER — Ambulatory Visit (INDEPENDENT_AMBULATORY_CARE_PROVIDER_SITE_OTHER): Payer: Medicaid Other | Admitting: Cardiology

## 2014-06-13 VITALS — BP 122/70 | HR 90 | Ht 66.0 in | Wt 128.0 lb

## 2014-06-13 DIAGNOSIS — R0789 Other chest pain: Secondary | ICD-10-CM

## 2014-06-13 DIAGNOSIS — Z01818 Encounter for other preprocedural examination: Secondary | ICD-10-CM

## 2014-06-13 DIAGNOSIS — I10 Essential (primary) hypertension: Secondary | ICD-10-CM

## 2014-06-13 NOTE — Patient Instructions (Signed)
Your physician recommends that you continue on your current medications as directed. Please refer to the Current Medication list given to you today.  Your physician wants you to follow-up in: 6 months. You will receive a reminder letter in the mail two months in advance. If you don't receive a letter, please call our office to schedule the follow-up appointment.  

## 2014-06-13 NOTE — Progress Notes (Signed)
Patient ID: [name] [name], female   DOB: August 10, 1956, 58 y.o.   MRN: [id number]    Patient Name: [name] [name] Date of Encounter: 06/13/2014  Primary Care Provider:  Philis Fendt, MD Primary Cardiologist:  Dorothy Spark  Problem List   Past Medical History  Diagnosis Date  . DM (diabetes mellitus), type 2     requires insulin  . Hypertension   . Depression   . Anxiety   . Hyperlipidemia   . Pancreatitis   . GERD (gastroesophageal reflux disease)   . Pancreatic cancer 05/2013    adenocarcinoma on ERCP/FNA  . Hearing impaired     lost the last of her hearing aids and cn not get another one.  hearing is better via right ear.   Marland Kitchen History of radiation therapy 07/20/13-08/09/13    Pancreas 37.5Gy  . DDD (degenerative disc disease), lumbar   . Chronic back pain   . Stroke   . Anemia   . Ulcer 01/2014    radiation gastroduodenitis with duodenal ulcer.    Past Surgical History  Procedure Laterality Date  . Appendectomy    . Ercp N/A 05/24/2013    Procedure: ENDOSCOPIC RETROGRADE CHOLANGIOPANCREATOGRAPHY (ERCP);  Surgeon: Milus Banister, MD;  Location: [place];  Service: Endoscopy;  Laterality: N/A;  MAC vs. general per anesthesia   . Tubal ligation    . Endoscopic retrograde cholangiopancreatography (ercp) with propofol N/A 10/21/2013    Procedure: ENDOSCOPIC RETROGRADE CHOLANGIOPANCREATOGRAPHY (ERCP) WITH PROPOFOL;  Surgeon: Milus Banister, MD;  Location: [facility];  Service: Endoscopy;  Laterality: N/A;  Stent removal   . Biliary stent placement N/A 10/21/2013    Procedure: BILIARY STENT PLACEMENT;  Surgeon: Milus Banister, MD;  Location: [facility];  Service: Endoscopy;  Laterality: N/A;  . Tee without cardioversion N/A 12/29/2013    Procedure: TRANSESOPHAGEAL ECHOCARDIOGRAM (TEE);  Surgeon: Dorothy Spark, MD;  Location: [place];  Service: Cardiovascular;  Laterality: N/A;  . Eus N/A 02/10/2014    Procedure: UPPER ENDOSCOPIC ULTRASOUND (EUS) LINEAR;  Surgeon: Milus Banister, MD;  Location: [facility];  Service: Endoscopy;  Laterality: N/A;  celiac plexus neurolysis  . Bile duct stent placement     Allergies  Allergies  Allergen Reactions  . Betadine [Povidone Iodine] Itching and Rash   HPI  58 year old female with h/o DM2, HTN, HLP and pancreatic adenocarcinoma diagnosed in July 2014, s/p biliary stenting, neoadjuvant chemotherapy and radiation therapy.  She came on 11/25/2013 for cardio clearance for another ERCP. Her stress test was negative and she has preserved LVEF.   The patient comes after 3 week follow up. She is scheduled to see a surgeon for consideration of partial tumor resection. The patient complains of chest pain that is positional, lower retrosternal to upper epigastric and radiating to her back. Sitting up alleviated the pain.  She also has a lot of abdominal pain, she takes Advil and tramadol.  06/13/2014 - patient is coming for followup. She denies any chest pain or shortness of breath. She has GI symptoms including nausea vomiting and abdominal pain.  Home Medications  Prior to Admission medications   Medication Sig Start Date End Date Taking? Authorizing Provider  gabapentin (NEURONTIN) 300 MG capsule Take 300 mg by mouth 3 (three) times daily.    Historical Provider, MD  hyaluronate sodium (RADIAPLEXRX) GEL Apply 1 application topically 2 (two) times daily.    Historical Provider, MD  HYDROmorphone (DILAUDID) 4 MG tablet Take 0.5-1 tablets (2-4 mg  total) by mouth every 4 (four) hours as needed for pain. 07/15/13   Owens Shark, NP  ibuprofen (ADVIL) 200 MG tablet Take 600 mg by mouth every 6 (six) hours as needed for pain.    Historical Provider, MD  insulin aspart (NOVOLOG) 100 UNIT/ML injection Inject 10 Units into the skin 3 (three) times daily with meals.    Historical Provider, MD  insulin glargine (LANTUS) 100 UNIT/ML injection Inject 20 Units into the skin at bedtime.    Historical Provider, MD  lisinopril  (PRINIVIL,ZESTRIL) 20 MG tablet Take 1 tablet (20 mg total) by mouth daily. 07/15/13   Owens Shark, NP  ondansetron (ZOFRAN-ODT) 4 MG disintegrating tablet Take 4 mg by mouth every 8 (eight) hours as needed for nausea. 07/23/13   Marye Round, MD  OxyCODONE (OXYCONTIN) 10 mg T12A 12 hr tablet Take 1 tablet (10 mg total) by mouth every 12 (twelve) hours. 09/24/13   Milus Banister, MD  pantoprazole (PROTONIX) 40 MG tablet Take 1 tablet (40 mg total) by mouth 2 (two) times daily. 08/15/13   Kelvin Cellar, MD  polyethylene glycol (MIRALAX / GLYCOLAX) packet Take 17 g by mouth daily as needed (constipation).    Historical Provider, MD  potassium chloride (K-DUR,KLOR-CON) 10 MEQ tablet Take 10 mEq by mouth daily.    Historical Provider, MD  simethicone (MYLICON) 786 MG chewable tablet Chew 125 mg by mouth every 6 (six) hours as needed for flatulence.    Historical Provider, MD  simvastatin (ZOCOR) 10 MG tablet Take 10 mg by mouth at bedtime.    Historical Provider, MD  traMADol (ULTRAM) 50 MG tablet Take 1 tablet (50 mg total) by mouth every 6 (six) hours as needed for pain. 08/17/13   Owens Shark, NP    Family History  Family History  Problem Relation Age of Onset  . Diabetes type II Mother   . Hypertension Mother   . Heart attack Father   . HIV/AIDS Brother     Social History  History   Social History  . Marital Status: Single    Spouse Name: N/A    Number of Children: 5  . Years of Education: N/A   Occupational History  . Not on file.   Social History Main Topics  . Smoking status: Former Smoker -- 0.50 packs/day for 42 years    Quit date: 05/28/2013  . Smokeless tobacco: Never Used  . Alcohol Use: No  . Drug Use: No  . Sexual Activity: Yes    Birth Control/ Protection: Post-menopausal   Other Topics Concern  . Not on file   Social History Narrative  . No narrative on file     Review of Systems, as per HPI, otherwise negative General:  No chills, fever, night  sweats or weight changes.  Cardiovascular:  No chest pain, dyspnea on exertion, edema, orthopnea, palpitations, paroxysmal nocturnal dyspnea. Dermatological: No rash, lesions/masses Respiratory: No cough, dyspnea Urologic: No hematuria, dysuria Abdominal:   No nausea, vomiting, diarrhea, bright red blood per rectum, melena, or hematemesis Neurologic:  No visual changes, wkns, changes in mental status. All other systems reviewed and are otherwise negative except as noted above.  Physical Exam BP 153/73, HR 75 BPM General: Pleasant, NAD Psych: Normal affect. Neuro: Alert and oriented X 3. Moves all extremities spontaneously. HEENT: Normal  Neck: Supple without bruits or JVD. Lungs:  Resp regular and unlabored, CTA. Heart: RRR no s3, s4, or murmurs. Abdomen: Soft, non-tender, non-distended, BS +  x 4.  Extremities: No clubbing, cyanosis or edema. DP/PT/Radials 2+ and equal bilaterally.  Labs:  No results found for this basename: CKTOTAL, CKMB, TROPONINI,  in the last 72 hours Lab Results  Component Value Date   WBC 5.1 06/06/2014   HGB 8.5* 06/06/2014   HCT 27.4* 06/06/2014   MCV 81.7 06/06/2014   PLT 328 06/06/2014    Accessory Clinical Findings  Echocardiogram - none to date  ECG - Sinus rhythm, possible type 2 Brugada pattern, abnormal ECG, unchanged from the prior preformed on 05/29/2013.   Assessment & Plan  58 year old female with locally advanced pancreatic cancer requiring ERCP for biliary stent placement. She will be evaluated by Dr. Barry Dienes to determine if she is a candidate for resection.  She has had symptoms of atypical chest pain and abdominal pain. Lexiscan nuclear stress test  showed normal left ventricular function with no wall motion abnormalities and no perfusion defects suggestive of a scar or ischemia. Her chest pain has resolved.  Her pain seems to be related to her pancreatic adenocarcinoma. There are some features suggestive of possible pericarditis. There  was pericardial effusion on the prior echocardiogram, repeat echocardiogram showed just trace pericardial effusion.  There is currently no contraindication from cardiology standpoint for this patient to undergo resection of the adenocarcinoma. We would recommend for her to continue taking metoprolol in the perioperative period.   Her blood pressure has normalized with increased lisinopril at 40 mg daily.   Follow up in 6 months.  Dorothy Spark, MD, [facility] 06/13/2014, 3:24 PM

## 2014-06-14 ENCOUNTER — Encounter (HOSPITAL_COMMUNITY): Payer: Self-pay

## 2014-06-14 ENCOUNTER — Other Ambulatory Visit: Payer: Self-pay | Admitting: Nurse Practitioner

## 2014-06-14 ENCOUNTER — Ambulatory Visit (HOSPITAL_COMMUNITY)
Admission: RE | Admit: 2014-06-14 | Discharge: 2014-06-14 | Disposition: A | Discharge status: Home / Self Care | Payer: Medicaid Other | Source: Ambulatory Visit | Attending: Nurse Practitioner | Admitting: Nurse Practitioner

## 2014-06-14 ENCOUNTER — Ambulatory Visit (HOSPITAL_COMMUNITY)
Admission: RE | Admit: 2014-06-14 | Discharge: 2014-06-14 | Disposition: A | Discharge status: Home / Self Care | Payer: Medicaid Other | Source: Ambulatory Visit | Attending: Oncology | Admitting: Oncology

## 2014-06-14 DIAGNOSIS — K219 Gastro-esophageal reflux disease without esophagitis: Secondary | ICD-10-CM | POA: Insufficient documentation

## 2014-06-14 DIAGNOSIS — E119 Type 2 diabetes mellitus without complications: Secondary | ICD-10-CM | POA: Diagnosis not present

## 2014-06-14 DIAGNOSIS — Z8673 Personal history of transient ischemic attack (TIA), and cerebral infarction without residual deficits: Secondary | ICD-10-CM | POA: Insufficient documentation

## 2014-06-14 DIAGNOSIS — E785 Hyperlipidemia, unspecified: Secondary | ICD-10-CM | POA: Insufficient documentation

## 2014-06-14 DIAGNOSIS — F411 Generalized anxiety disorder: Secondary | ICD-10-CM | POA: Insufficient documentation

## 2014-06-14 DIAGNOSIS — Z87891 Personal history of nicotine dependence: Secondary | ICD-10-CM | POA: Insufficient documentation

## 2014-06-14 DIAGNOSIS — I10 Essential (primary) hypertension: Secondary | ICD-10-CM | POA: Insufficient documentation

## 2014-06-14 DIAGNOSIS — F3289 Other specified depressive episodes: Secondary | ICD-10-CM | POA: Insufficient documentation

## 2014-06-14 DIAGNOSIS — C259 Malignant neoplasm of pancreas, unspecified: Secondary | ICD-10-CM

## 2014-06-14 DIAGNOSIS — F329 Major depressive disorder, single episode, unspecified: Secondary | ICD-10-CM | POA: Diagnosis not present

## 2014-06-14 LAB — CBC WITH DIFFERENTIAL/PLATELET
Basophils Absolute: 0 10*3/uL (ref 0.0–0.1)
Basophils Relative: 0 % (ref 0–1)
Eosinophils Absolute: 0 10*3/uL (ref 0.0–0.7)
Eosinophils Relative: 1 % (ref 0–5)
HEMATOCRIT: 25.6 % — AB (ref 36.0–46.0)
HEMOGLOBIN: 8.2 g/dL — AB (ref 12.0–15.0)
LYMPHS ABS: 0.5 10*3/uL — AB (ref 0.7–4.0)
LYMPHS PCT: 13 % (ref 12–46)
MCH: 25.5 pg — ABNORMAL LOW (ref 26.0–34.0)
MCHC: 32 g/dL (ref 30.0–36.0)
MCV: 79.5 fL (ref 78.0–100.0)
MONO ABS: 0.3 10*3/uL (ref 0.1–1.0)
MONOS PCT: 10 % (ref 3–12)
NEUTROS ABS: 2.6 10*3/uL (ref 1.7–7.7)
NEUTROS PCT: 76 % (ref 43–77)
Platelets: 209 10*3/uL (ref 150–400)
RBC: 3.22 MIL/uL — AB (ref 3.87–5.11)
RDW: 15.4 % (ref 11.5–15.5)
WBC: 3.5 10*3/uL — AB (ref 4.0–10.5)

## 2014-06-14 LAB — BASIC METABOLIC PANEL
ANION GAP: 13 (ref 5–15)
BUN: 13 mg/dL (ref 6–23)
CHLORIDE: 100 meq/L (ref 96–112)
CO2: 23 meq/L (ref 19–32)
CREATININE: 0.49 mg/dL — AB (ref 0.50–1.10)
Calcium: 9.2 mg/dL (ref 8.4–10.5)
GFR calc non Af Amer: >90 mL/min (ref >90)
Glucose, Bld: 138 mg/dL — ABNORMAL HIGH (ref 70–99)
POTASSIUM: 3.4 meq/L — AB (ref 3.7–5.3)
SODIUM: 136 meq/L — AB (ref 137–147)

## 2014-06-14 LAB — PROTIME-INR
INR: 1.04 (ref 0.00–1.49)
Prothrombin Time: 13.6 seconds (ref 11.6–15.2)

## 2014-06-14 LAB — GLUCOSE, CAPILLARY: Glucose-Capillary: 133 mg/dL — ABNORMAL HIGH (ref 70–99)

## 2014-06-14 LAB — APTT: APTT: 29 s (ref 24–37)

## 2014-06-14 MED ORDER — FENTANYL CITRATE 0.05 MG/ML IJ SOLN
INTRAMUSCULAR | Status: AC
  Filled 2014-06-14: qty 6

## 2014-06-14 MED ORDER — HEPARIN SOD (PORK) LOCK FLUSH 100 UNIT/ML IV SOLN
INTRAVENOUS | Status: AC
  Filled 2014-06-14: qty 5

## 2014-06-14 MED ORDER — FENTANYL CITRATE 0.05 MG/ML IJ SOLN
INTRAMUSCULAR | Status: AC | PRN
  Administered 2014-06-14 (×2): 50 ug via INTRAVENOUS

## 2014-06-14 MED ORDER — LIDOCAINE-EPINEPHRINE (PF) 2 %-1:200000 IJ SOLN
INTRAMUSCULAR | Status: AC
  Filled 2014-06-14: qty 20

## 2014-06-14 MED ORDER — HEPARIN SOD (PORK) LOCK FLUSH 100 UNIT/ML IV SOLN
INTRAVENOUS | Status: AC | PRN
  Administered 2014-06-14: 500 [IU] via INTRAVENOUS

## 2014-06-14 MED ORDER — CEFAZOLIN SODIUM-DEXTROSE 2-3 GM-% IV SOLR
2.0000 g | INTRAVENOUS | Status: AC
  Administered 2014-06-14: 2 g via INTRAVENOUS

## 2014-06-14 MED ORDER — MIDAZOLAM HCL 2 MG/2ML IJ SOLN
INTRAMUSCULAR | Status: AC
  Filled 2014-06-14: qty 6

## 2014-06-14 MED ORDER — SODIUM CHLORIDE 0.9 % IV SOLN
INTRAVENOUS | Status: DC
  Administered 2014-06-14: 13:00:00 via INTRAVENOUS

## 2014-06-14 MED ORDER — MIDAZOLAM HCL 2 MG/2ML IJ SOLN
INTRAMUSCULAR | Status: DC | PRN
  Administered 2014-06-14: 0.5 mg via INTRAVENOUS

## 2014-06-14 MED ORDER — LIDOCAINE HCL 1 % IJ SOLN
INTRAMUSCULAR | Status: AC
  Filled 2014-06-14: qty 20

## 2014-06-14 MED ORDER — CEFAZOLIN SODIUM-DEXTROSE 2-3 GM-% IV SOLR
INTRAVENOUS | Status: AC
  Administered 2014-06-14: 2 g via INTRAVENOUS
  Filled 2014-06-14: qty 50

## 2014-06-14 MED ORDER — MIDAZOLAM HCL 2 MG/2ML IJ SOLN
INTRAMUSCULAR | Status: AC | PRN
  Administered 2014-06-14 (×2): 1 mg via INTRAVENOUS

## 2014-06-14 MED ORDER — ONDANSETRON HCL 4 MG/2ML IJ SOLN
4.0000 mg | Freq: Once | INTRAMUSCULAR | Status: AC
  Administered 2014-06-14: 4 mg via INTRAVENOUS
  Filled 2014-06-14: qty 2

## 2014-06-14 NOTE — H&P (Signed)
Chief Complaint: "I'm here for a port" Referring Physician:Lisa Marcello Moores, NP HPI: [name] [name] is an 58 y.o. female with pancreatic cancer who has been receiving chemotherapy but has had more difficulty with IV access. She is referred for [facility] placement. PMHx, meds, labs reviewed. Pt has been NPO today. Denies recent fevers, chills, illness.  Past Medical History:  Past Medical History  Diagnosis Date  . DM (diabetes mellitus), type 2     requires insulin  . Hypertension   . Depression   . Anxiety   . Hyperlipidemia   . Pancreatitis   . GERD (gastroesophageal reflux disease)   . Pancreatic cancer 05/2013    adenocarcinoma on ERCP/FNA  . Hearing impaired     lost the last of her hearing aids and cn not get another one.  hearing is better via right ear.   Marland Kitchen History of radiation therapy 07/20/13-08/09/13    Pancreas 37.5Gy  . DDD (degenerative disc disease), lumbar   . Chronic back pain   . Stroke   . Anemia   . Ulcer 01/2014    radiation gastroduodenitis with duodenal ulcer.     Past Surgical History:  Past Surgical History  Procedure Laterality Date  . Appendectomy    . Ercp N/A 05/24/2013    Procedure: ENDOSCOPIC RETROGRADE CHOLANGIOPANCREATOGRAPHY (ERCP);  Surgeon: Milus Banister, MD;  Location: [place];  Service: Endoscopy;  Laterality: N/A;  MAC vs. general per anesthesia   . Tubal ligation    . Endoscopic retrograde cholangiopancreatography (ercp) with propofol N/A 10/21/2013    Procedure: ENDOSCOPIC RETROGRADE CHOLANGIOPANCREATOGRAPHY (ERCP) WITH PROPOFOL;  Surgeon: Milus Banister, MD;  [facility];  Service: Endoscopy;  Laterality: N/A;  Stent removal   . Biliary stent placement N/A 10/21/2013    Procedure: BILIARY STENT PLACEMENT;  Surgeon: Milus Banister, MD;  [facility];  Service: Endoscopy;  Laterality: N/A;  . Tee without cardioversion N/A 12/29/2013    Procedure: TRANSESOPHAGEAL ECHOCARDIOGRAM (TEE);  Surgeon: Dorothy Spark, MD;  Location: [place];  Service: Cardiovascular;  Laterality: N/A;  . Eus N/A 02/10/2014    Procedure: UPPER ENDOSCOPIC ULTRASOUND (EUS) LINEAR;  Surgeon: Milus Banister, MD;  [facility];  Service: Endoscopy;  Laterality: N/A;  celiac plexus neurolysis  . Bile duct stent placement      Family History:  Family History  Problem Relation Age of Onset  . Diabetes type II Mother   . Hypertension Mother   . Heart attack Father   . HIV/AIDS Brother     Social History:  reports that she quit smoking about 12 months ago. She has never used smokeless tobacco. She reports that she does not drink alcohol or use illicit drugs.  Allergies:  Allergies  Allergen Reactions  . Betadine [Povidone Iodine] Itching and Rash    Medications:   Medication List    ASK your doctor about these medications       ADVIL 200 MG tablet  Generic drug:  ibuprofen  Take 800 mg by mouth every 6 (six) hours as needed for mild pain or moderate pain.     amLODipine 10 MG tablet  Commonly known as:  NORVASC  Take 10 mg by mouth every morning.     aspirin EC 81 MG tablet  Take 81 mg by mouth daily.     cetirizine 10 MG tablet  Commonly known as:  ZYRTEC  Take 10 mg by mouth daily as needed for allergies.     docusate  sodium 100 MG capsule  Commonly known as:  COLACE  Take 1 capsule (100 mg total) by mouth 2 (two) times daily as needed for mild constipation.     gabapentin 300 MG capsule  Commonly known as:  NEURONTIN  Take 300 mg by mouth 3 (three) times daily.     HYDROcodone-acetaminophen 5-325 MG per tablet  Commonly known as:  NORCO/VICODIN  Take 1-2 tablets by mouth every 4 (four) hours as needed for moderate pain (take during daytime).     insulin aspart 100 UNIT/ML injection  Commonly known as:  novoLOG  Inject 10 Units into the skin 3 (three) times daily with meals.     insulin glargine 100 UNIT/ML injection  Commonly known as:  LANTUS  Inject 30 Units into the skin at bedtime.      lipase/protease/amylase 12000 UNITS Cpep capsule  Commonly known as:  CREON  Take 1 capsule by mouth 3 (three) times daily before meals.     lisinopril 40 MG tablet  Commonly known as:  PRINIVIL,ZESTRIL  Take 40 mg by mouth every morning.     metoCLOPramide 5 MG tablet  Commonly known as:  REGLAN  Take 5 mg by mouth 3 (three) times daily before meals.     metoprolol tartrate 25 MG tablet  Commonly known as:  LOPRESSOR  Take 25 mg by mouth 2 (two) times daily.     ondansetron 8 MG disintegrating tablet  Commonly known as:  ZOFRAN ODT  Take 1 tablet (8 mg total) by mouth every 8 (eight) hours as needed for nausea or vomiting.     Oxycodone HCl 10 MG Tabs  Take 1 tablet (10 mg total) by mouth every 4 (four) hours as needed (severe pain).     pantoprazole 40 MG tablet  Commonly known as:  PROTONIX  Take 40 mg by mouth daily.     polyethylene glycol packet  Commonly known as:  MIRALAX / GLYCOLAX  Take 17 g by mouth daily as needed for mild constipation or moderate constipation.     potassium chloride 10 MEQ tablet  Commonly known as:  K-DUR,KLOR-CON  Take 10 mEq by mouth daily.     simethicone 125 MG chewable tablet  Commonly known as:  MYLICON  Chew 740 mg by mouth every 6 (six) hours as needed for flatulence.     simvastatin 10 MG tablet  Commonly known as:  ZOCOR  Take 10 mg by mouth at bedtime.     sucralfate 1 GM/10ML suspension  Commonly known as:  CARAFATE  Take 1 g by mouth 2 (two) times daily.        Please HPI for pertinent positives, otherwise complete 10 system ROS negative.  Physical Exam: BP 130/67  Pulse 65  Temp(Src) 98.5 F (36.9 C) (Oral)  Resp 18  SpO2 100% There is no weight on file to calculate BMI.   General Appearance:  Alert, cooperative, no distress, appears stated age  Head:  Normocephalic, without obvious abnormality, atraumatic  ENT: Unremarkable  Neck: Supple, symmetrical, trachea midline  Lungs:   Clear to auscultation  bilaterally, no w/r/r, respirations unlabored without use of accessory muscles.  Chest Wall:  No tenderness or deformity  Heart:  Regular rate and rhythm, S1, S2 normal, no murmur, rub or gallop.  Abdomen:   Soft, non-tender, non distended.  Neurologic: Normal affect, no gross deficits.   Results for orders placed during the hospital encounter of 06/14/14 (from the past 48 hour(s))  APTT  Status: None   Collection Time    06/14/14 12:30 PM      Result Value Ref Range   aPTT 29  24 - 37 seconds  CBC WITH DIFFERENTIAL     Status: Abnormal   Collection Time    06/14/14 12:30 PM      Result Value Ref Range   WBC 3.5 (*) 4.0 - 10.5 K/uL   RBC 3.22 (*) 3.87 - 5.11 MIL/uL   Hemoglobin 8.2 (*) 12.0 - 15.0 g/dL   HCT 25.6 (*) 36.0 - 46.0 %   MCV 79.5  78.0 - 100.0 fL   MCH 25.5 (*) 26.0 - 34.0 pg   MCHC 32.0  30.0 - 36.0 g/dL   RDW 15.4  11.5 - 15.5 %   Platelets 209  150 - 400 K/uL   Neutrophils Relative % 76  43 - 77 %   Neutro Abs 2.6  1.7 - 7.7 K/uL   Lymphocytes Relative 13  12 - 46 %   Lymphs Abs 0.5 (*) 0.7 - 4.0 K/uL   Monocytes Relative 10  3 - 12 %   Monocytes Absolute 0.3  0.1 - 1.0 K/uL   Eosinophils Relative 1  0 - 5 %   Eosinophils Absolute 0.0  0.0 - 0.7 K/uL   Basophils Relative 0  0 - 1 %   Basophils Absolute 0.0  0.0 - 0.1 K/uL  PROTIME-INR     Status: None   Collection Time    06/14/14 12:30 PM      Result Value Ref Range   Prothrombin Time 13.6  11.6 - 15.2 seconds   INR 1.04  0.00 - 1.49   No results found.  Assessment/Plan Pancreatic cancer For Port placement Discussed procedure, risks, complications, use of sedation. Labs reviewed, ok Consent signed in chart  Ascencion Dike PA-C 06/14/2014, 12:59 PM

## 2014-06-14 NOTE — Procedures (Signed)
RIJV PAC SVC RA No comp 

## 2014-06-14 NOTE — Discharge Instructions (Signed)
Implanted Port Home Guide °An implanted port is a type of central line that is placed under the skin. Central lines are used to provide IV access when treatment or nutrition needs to be given through a person's veins. Implanted ports are used for long-term IV access. An implanted port may be placed because:  °· You need IV medicine that would be irritating to the small veins in your hands or arms.   °· You need long-term IV medicines, such as antibiotics.   °· You need IV nutrition for a long period.   °· You need frequent blood draws for lab tests.   °· You need dialysis.   °Implanted ports are usually placed in the chest area, but they can also be placed in the upper arm, the abdomen, or the leg. An implanted port has two main parts:  °· Reservoir. The reservoir is round and will appear as a small, raised area under your skin. The reservoir is the part where a needle is inserted to give medicines or draw blood.   °· Catheter. The catheter is a thin, flexible tube that extends from the reservoir. The catheter is placed into a large vein. Medicine that is inserted into the reservoir goes into the catheter and then into the vein.   °HOW WILL I CARE FOR MY INCISION SITE? °Do not get the incision site wet. Bathe or shower as directed by your health care provider.  °HOW IS MY PORT ACCESSED? °Special steps must be taken to access the port:  °· Before the port is accessed, a numbing cream can be placed on the skin. This helps numb the skin over the port site.   °· Your health care provider uses a sterile technique to access the port. °¨ Your health care provider must put on a mask and sterile gloves. °¨ The skin over your port is cleaned carefully with an antiseptic and allowed to dry. °¨ The port is gently pinched between sterile gloves, and a needle is inserted into the port. °· Only "non-coring" port needles should be used to access the port. Once the port is accessed, a blood return should be checked. This helps  ensure that the port is in the vein and is not clogged.   °· If your port needs to remain accessed for a constant infusion, a clear (transparent) bandage will be placed over the needle site. The bandage and needle will need to be changed every week, or as directed by your health care provider.   °· Keep the bandage covering the needle clean and dry. Do not get it wet. Follow your health care provider's instructions on how to take a shower or bath while the port is accessed.   °· If your port does not need to stay accessed, no bandage is needed over the port.   °WHAT IS FLUSHING? °Flushing helps keep the port from getting clogged. Follow your health care provider's instructions on how and when to flush the port. Ports are usually flushed with saline solution or a medicine called heparin. The need for flushing will depend on how the port is used.  °· If the port is used for intermittent medicines or blood draws, the port will need to be flushed:   °¨ After medicines have been given.   °¨ After blood has been drawn.   °¨ As part of routine maintenance.   °· If a constant infusion is running, the port may not need to be flushed.   °HOW LONG WILL MY PORT STAY IMPLANTED? °The port can stay in for as long as your health care   provider thinks it is needed. When it is time for the port to come out, surgery will be done to remove it. The procedure is similar to the one performed when the port was put in.  °WHEN SHOULD I SEEK IMMEDIATE MEDICAL CARE? °When you have an implanted port, you should seek immediate medical care if:  °· You notice a bad smell coming from the incision site.   °· You have swelling, redness, or drainage at the incision site.   °· You have more swelling or pain at the port site or the surrounding area.   °· You have a fever that is not controlled with medicine. °Document Released: 11/04/2005 Document Revised: 08/25/2013 Document Reviewed: 07/12/2013 °ExitCare® Patient Information ©2015 [employer]. This  information is not intended to replace advice given to you by your health care provider. Make sure you discuss any questions you have with your health care provider. °Conscious Sedation °Sedation is the use of medicines to promote relaxation and relieve discomfort and anxiety. Conscious sedation is a type of sedation. Under conscious sedation you are less alert than normal but are still able to respond to instructions or stimulation. Conscious sedation is used during short medical and dental procedures. It is milder than deep sedation or general anesthesia and allows you to return to your regular activities sooner.  °LET YOUR HEALTH CARE PROVIDER KNOW ABOUT:  °· Any allergies you have. °· All medicines you are taking, including vitamins, herbs, eye drops, creams, and over-the-counter medicines. °· Use of steroids (by mouth or creams). °· Previous problems you or members of your family have had with the use of anesthetics. °· Any blood disorders you have. °· Previous surgeries you have had. °· Medical conditions you have. °· Possibility of pregnancy, if this applies. °· Use of cigarettes, alcohol, or illegal drugs. °RISKS AND COMPLICATIONS °Generally, this is a safe procedure. However, as with any procedure, problems can occur. Possible problems include: °· Oversedation. °· Trouble breathing on your own. You may need to have a breathing tube until you are awake and breathing on your own. °· Allergic reaction to any of the medicines used for the procedure. °BEFORE THE PROCEDURE °· You may have blood tests done. These tests can help show how well your kidneys and liver are working. They can also show how well your blood clots. °· A physical exam will be done.   °· Only take medicines as directed by your health care provider. You may need to stop taking medicines (such as blood thinners, aspirin, or nonsteroidal anti-inflammatory drugs) before the procedure.   °· Do not eat or drink at least 6 hours before the procedure  or as directed by your health care provider. °· Arrange for a responsible adult, family member, or friend to take you home after the procedure. He or she should stay with you for at least 24 hours after the procedure, until the medicine has worn off. °PROCEDURE  °· An intravenous (IV) catheter will be inserted into one of your veins. Medicine will be able to flow directly into your body through this catheter. You may be given medicine through this tube to help prevent pain and help you relax. °· The medical or dental procedure will be done. °AFTER THE PROCEDURE °· You will stay in a recovery area until the medicine has worn off. Your blood pressure and pulse will be checked.   °·  Depending on the procedure you had, you may be allowed to go home when you can tolerate liquids and your pain is under   control. °Document Released: 07/30/2001 Document Revised: 11/09/2013 Document Reviewed: 07/12/2013 °ExitCare® Patient Information ©2015 [employer]. This information is not intended to replace advice given to you by your health care provider. Make sure you discuss any questions you have with your health care provider. ° °

## 2014-06-19 ENCOUNTER — Other Ambulatory Visit: Payer: Self-pay | Admitting: Oncology

## 2014-06-20 ENCOUNTER — Telehealth: Payer: Self-pay | Admitting: Oncology

## 2014-06-20 ENCOUNTER — Other Ambulatory Visit (HOSPITAL_BASED_OUTPATIENT_CLINIC_OR_DEPARTMENT_OTHER): Payer: Medicaid Other

## 2014-06-20 ENCOUNTER — Telehealth: Payer: Self-pay | Admitting: *Deleted

## 2014-06-20 ENCOUNTER — Ambulatory Visit (HOSPITAL_BASED_OUTPATIENT_CLINIC_OR_DEPARTMENT_OTHER): Payer: Medicaid Other | Admitting: Nurse Practitioner

## 2014-06-20 VITALS — BP 160/71 | HR 75 | Temp 98.6°F | Resp 19 | Ht 66.0 in | Wt 128.3 lb

## 2014-06-20 DIAGNOSIS — C250 Malignant neoplasm of head of pancreas: Secondary | ICD-10-CM

## 2014-06-20 DIAGNOSIS — C259 Malignant neoplasm of pancreas, unspecified: Secondary | ICD-10-CM

## 2014-06-20 DIAGNOSIS — R197 Diarrhea, unspecified: Secondary | ICD-10-CM

## 2014-06-20 DIAGNOSIS — R630 Anorexia: Secondary | ICD-10-CM

## 2014-06-20 LAB — CBC WITH DIFFERENTIAL/PLATELET
BASO%: 0.4 % (ref 0.0–2.0)
BASOS ABS: 0 10*3/uL (ref 0.0–0.1)
EOS%: 2.2 % (ref 0.0–7.0)
Eosinophils Absolute: 0.1 10*3/uL (ref 0.0–0.5)
HEMATOCRIT: 27.3 % — AB (ref 34.8–46.6)
HEMOGLOBIN: 8.4 g/dL — AB (ref 11.6–15.9)
LYMPH%: 10.4 % — ABNORMAL LOW (ref 14.0–49.7)
MCH: 24.7 pg — ABNORMAL LOW (ref 25.1–34.0)
MCHC: 30.7 g/dL — ABNORMAL LOW (ref 31.5–36.0)
MCV: 80.4 fL (ref 79.5–101.0)
MONO#: 0.4 10*3/uL (ref 0.1–0.9)
MONO%: 9.7 % (ref 0.0–14.0)
NEUT#: 3.3 10*3/uL (ref 1.5–6.5)
NEUT%: 77.3 % — AB (ref 38.4–76.8)
Platelets: 210 10*3/uL (ref 145–400)
RBC: 3.39 10*6/uL — ABNORMAL LOW (ref 3.70–5.45)
RDW: 16.7 % — AB (ref 11.2–14.5)
WBC: 4.3 10*3/uL (ref 3.9–10.3)
lymph#: 0.4 10*3/uL — ABNORMAL LOW (ref 0.9–3.3)

## 2014-06-20 LAB — COMPREHENSIVE METABOLIC PANEL (CC13)
ALT: 22 U/L (ref 0–55)
AST: 38 U/L — ABNORMAL HIGH (ref 5–34)
Albumin: 3.1 g/dL — ABNORMAL LOW (ref 3.5–5.0)
Alkaline Phosphatase: 464 U/L — ABNORMAL HIGH (ref 40–150)
Anion Gap: 8 mEq/L (ref 3–11)
BUN: 13.4 mg/dL (ref 7.0–26.0)
CO2: 25 meq/L (ref 22–29)
Calcium: 9.1 mg/dL (ref 8.4–10.4)
Chloride: 106 mEq/L (ref 98–109)
Creatinine: 0.6 mg/dL (ref 0.6–1.1)
Glucose: 135 mg/dl (ref 70–140)
Potassium: 3 mEq/L — CL (ref 3.5–5.1)
Sodium: 139 mEq/L (ref 136–145)
Total Bilirubin: 0.44 mg/dL (ref 0.20–1.20)
Total Protein: 7.5 g/dL (ref 6.4–8.3)

## 2014-06-20 MED ORDER — LIDOCAINE-PRILOCAINE 2.5-2.5 % EX CREA: 1.0000 "application " | TOPICAL_CREAM | CUTANEOUS | Status: DC | PRN

## 2014-06-20 MED ORDER — MIRTAZAPINE 15 MG PO TABS: 15.0000 mg | ORAL_TABLET | Freq: Every day | ORAL | Status: DC

## 2014-06-20 MED ORDER — OXYCODONE HCL 10 MG PO TABS: 10.0000 mg | ORAL_TABLET | ORAL | Status: DC | PRN

## 2014-06-20 MED ORDER — HYDROCODONE-ACETAMINOPHEN 5-325 MG PO TABS: 1.0000 | ORAL_TABLET | ORAL | Status: DC | PRN

## 2014-06-20 NOTE — Telephone Encounter (Signed)
Pt confirmed labs/ov per 08/03 POF, gave pt AVS...KJ °

## 2014-06-20 NOTE — Progress Notes (Signed)
[place] OFFICE PROGRESS NOTE   Diagnosis:  Pancreas cancer.  INTERVAL HISTORY:   [name] [name] returns as scheduled. She completed cycle 2 gemcitabine/Abraxane on 06/06/2014. No nausea/vomiting. No mouth sores. She continues to have intermittent loose stools. She had 3 loose stools yesterday. None so far today. She takes pancreatic enzyme replacement periodically. She confirms she is no longer taking Reglan. She notes a decrease in her appetite for 3-5 days following chemotherapy. She then feels hungry but notes an alteration in taste. She is concerned regarding weight loss. No numbness or tingling in her hands or feet. She denies fever following chemotherapy. No shortness of breath. No significant cough. No skin rash. She states she is "moody". She cries periodically. Abdominal pain is unchanged. She continues hydrocodone and Percocet.  Objective:  Vital signs in last 24 hours:  Blood pressure 160/71, pulse 75, temperature 98.6 F (37 C), temperature source Oral, resp. rate 19, height 5\' 6"  (1.676 m), weight 128 lb 4.8 oz (58.196 kg), SpO2 100.00%.    HEENT: Mild white coating over tongue. Resp: Lungs clear. Cardio: Regular cardiac rhythm. Tender over the right upper abdomen. No hepatomegaly. No mass. GI: Abdomen soft. Vascular: Trace lower leg edema bilaterally. Neuro: Alert and oriented. Hard of hearing. Vibratory sense intact over the fingertips per tuning fork exam.   Port-A-Cath site without erythema.   Lab Results:  Lab Results  Component Value Date   WBC 4.3 06/20/2014   HGB 8.4* 06/20/2014   HCT 27.3* 06/20/2014   MCV 80.4 06/20/2014   PLT 210 06/20/2014   NEUTROABS 3.3 06/20/2014    Imaging:  No results found.  Medications: I have reviewed the patient's current medications.  Assessment/Plan: 1. Adenocarcinoma of the pancreas presenting with an obstructing pancreas head mass, clinical stage II versus III (T3-T4, N1); potential abutment of the celiac axis  noted on the MRI abdomen 05/22/2013.  Initiation of radiation and Xeloda 07/20/2013 with completion 08/09/2013.  CT scans abdomen/pelvis done 11/01/2013 for evaluation of increased abdominal pain showed stable infiltrating pancreatic mass. No progression identified. Stable peripancreatic lymph nodes. 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 No evidence of metastatic disease.  Pancreatic head mass stable on CT 01/03/2014 and 03/07/2014.  CA 19-9 736 on 05/24/2014.  Initiation of gemcitabine/Abraxane on a 2 week schedule 05/24/2014. 2. Obstructive jaundice secondary to #1. Status post placement of a bile duct stent on 05/24/2013. Status post placement of a metal stent 10/21/2013. 3. Pain -likely secondary to pancreas cancer, no improvement following the celiac plexus nerve block 02/10/2014. She continues Oxycodone and hydrocodone. 4. Diabetes. 5. History of chronic pancreatitis. 6. History of alcohol use. 7. History of tobacco use. 8. Anxiety/depression. 9. Cutaneous nodule at the left flank. Question cyst, question metastasis. 10. Abnormal EKG, question Brugada syndrome. 11. Hospitalization 08/12/2013 through 08/15/2013 with nausea/vomiting and abdominal pain. Improved. 12. 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 Portal vein patent. New wall thickening of the mid to distal stomach and the hepatic flexure and proximal transverse colon. 13. 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 She discontinued Lovenox due to abdominal wall ecchymoses and hematomas. 14. Admission 12/22/2013 with strep viridans bacteremia status post 4 week course of ceftriaxone.  15. Status post celiac plexus nerve block 02/10/2014.  16. 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 She  started Pancrease after an office visit 02/07/2014. She continues to have intermittent diarrhea. She does not take the pancreatic enzyme replacement consistently. 17. Elevated liver enzymes. Improved 05/24/2014. 18. Admission 04/09/2014 with Escherichia coli and fungal bacteremia, completing outpatient ceftriaxone and micafungin. 19. Hypokalemia. She is on a potassium supplement. 20. Status post Port-A-Cath placement 06/14/2014. 21. Anorexia, depression. Remeron initiated 06/20/2014.   Disposition: [name] [name] appears stable. She has completed 2 cycles of gemcitabine/Abraxane. She will return for cycle 3 on 06/21/2014.  She is experiencing anorexia following chemotherapy as well as an alteration in taste. We made a referral to the [facility] dietitian. For the anorexia as well as probable depression she will begin Remeron 15 mg at bedtime.  She continues to have intermittent diarrhea. She will try to take the pancreatic enzyme replacement consistently. She will also begin Imodium.  For the hypokalemia she will take K-Dur 20 milliequivalents twice daily for 3 days and then 20 mEq daily.  She will return for cycle 4 on 07/05/2014. We will see her in followup on 07/18/2014. She will contact the office in the interim with any problems.  Plan reviewed with Dr. Benay Spice. 35 minutes were spent face-to-face at today's visit with the majority of that time involved in counseling/coordination of care.    Ned Card ANP/GNP-BC   06/20/2014  1:39 PM

## 2014-06-20 NOTE — Patient Instructions (Addendum)
Try Imodium for diarrhea. Take kdur 20 meq twice daily for 3 days, then take 20 meq daily.

## 2014-06-20 NOTE — Telephone Encounter (Signed)
Per [facility] staff message scheduled appts. Advised scheduler 

## 2014-06-20 NOTE — Telephone Encounter (Signed)
Received fax from [facility] pt has been discharged from their services: Goals met.

## 2014-06-21 ENCOUNTER — Ambulatory Visit: Payer: Medicaid Other | Admitting: Nutrition

## 2014-06-21 ENCOUNTER — Other Ambulatory Visit: Payer: Self-pay

## 2014-06-21 ENCOUNTER — Ambulatory Visit: Payer: Medicaid Other

## 2014-06-21 ENCOUNTER — Ambulatory Visit (HOSPITAL_BASED_OUTPATIENT_CLINIC_OR_DEPARTMENT_OTHER): Payer: Medicaid Other

## 2014-06-21 VITALS — BP 156/82 | HR 71 | Temp 98.4°F | Resp 20

## 2014-06-21 DIAGNOSIS — C250 Malignant neoplasm of head of pancreas: Secondary | ICD-10-CM

## 2014-06-21 DIAGNOSIS — C259 Malignant neoplasm of pancreas, unspecified: Secondary | ICD-10-CM

## 2014-06-21 DIAGNOSIS — Z5111 Encounter for antineoplastic chemotherapy: Secondary | ICD-10-CM

## 2014-06-21 LAB — CANCER ANTIGEN 19-9: CA 19-9: 323.6 U/mL — ABNORMAL HIGH (ref <35.0)

## 2014-06-21 MED ORDER — ONDANSETRON 8 MG/50ML IVPB (CHCC)
8.0000 mg | Freq: Once | INTRAVENOUS | Status: AC
  Administered 2014-06-21: 8 mg via INTRAVENOUS

## 2014-06-21 MED ORDER — SODIUM CHLORIDE 0.9 % IV SOLN
700.0000 mg/m2 | Freq: Once | INTRAVENOUS | Status: AC
  Administered 2014-06-21: 1178 mg via INTRAVENOUS
  Filled 2014-06-21: qty 30.98

## 2014-06-21 MED ORDER — LORAZEPAM 1 MG PO TABS
ORAL_TABLET | ORAL | Status: AC
  Filled 2014-06-21: qty 1

## 2014-06-21 MED ORDER — SODIUM CHLORIDE 0.9 % IV SOLN
Freq: Once | INTRAVENOUS | Status: AC
  Administered 2014-06-21: 10:00:00 via INTRAVENOUS

## 2014-06-21 MED ORDER — SODIUM CHLORIDE 0.9 % IJ SOLN
10.0000 mL | INTRAMUSCULAR | Status: DC | PRN
  Administered 2014-06-21: 10 mL
  Filled 2014-06-21: qty 10

## 2014-06-21 MED ORDER — PACLITAXEL PROTEIN-BOUND CHEMO INJECTION 100 MG
100.0000 mg/m2 | Freq: Once | INTRAVENOUS | Status: AC
  Administered 2014-06-21: 175 mg via INTRAVENOUS
  Filled 2014-06-21: qty 35

## 2014-06-21 MED ORDER — DEXAMETHASONE SODIUM PHOSPHATE 10 MG/ML IJ SOLN
INTRAMUSCULAR | Status: AC
  Filled 2014-06-21: qty 1

## 2014-06-21 MED ORDER — HEPARIN SOD (PORK) LOCK FLUSH 100 UNIT/ML IV SOLN
500.0000 [IU] | Freq: Once | INTRAVENOUS | Status: AC | PRN
  Administered 2014-06-21: 500 [IU]
  Filled 2014-06-21: qty 5

## 2014-06-21 MED ORDER — LORAZEPAM 1 MG PO TABS
0.5000 mg | ORAL_TABLET | Freq: Once | ORAL | Status: AC
  Administered 2014-06-21: 0.5 mg via ORAL

## 2014-06-21 MED ORDER — LIDOCAINE-PRILOCAINE 2.5-2.5 % EX CREA
TOPICAL_CREAM | CUTANEOUS | Status: AC
  Filled 2014-06-21: qty 5

## 2014-06-21 MED ORDER — DEXAMETHASONE SODIUM PHOSPHATE 10 MG/ML IJ SOLN
10.0000 mg | Freq: Once | INTRAMUSCULAR | Status: AC
  Administered 2014-06-21: 10 mg via INTRAVENOUS

## 2014-06-21 MED ORDER — ONDANSETRON 8 MG/NS 50 ML IVPB
INTRAVENOUS | Status: AC
  Filled 2014-06-21: qty 8

## 2014-06-21 NOTE — Progress Notes (Signed)
Met with patient during infusion.  She expressed frustrations on 06/20/14 regarding the changes in her looks and her hair loss.  Gave patient voucher for cranial prosthesis.  She plans to take and use at the [employer] today.  Patient also expressed interest in the Look Good Feel Better class.  She was given the class schedule and was encouraged to sign up.  Patient has been seen by dietician today.  She is also to be seen by SW today to follow up and assess for needs in the home.  Patient's daughter is present today and very supportive of her mother.  Will continue to follow as needed.

## 2014-06-21 NOTE — Patient Instructions (Signed)
[facility] Discharge Instructions for Patients Receiving Chemotherapy  Today you received the following chemotherapy agents Abraxane and Gemzar.  To help prevent nausea and vomiting after your treatment, we encourage you to take your nausea medication.   If you develop nausea and vomiting that is not controlled by your nausea medication, call the clinic.   BELOW ARE SYMPTOMS THAT SHOULD BE REPORTED IMMEDIATELY:  *FEVER GREATER THAN 100.5 F  *CHILLS WITH OR WITHOUT FEVER  NAUSEA AND VOMITING THAT IS NOT CONTROLLED WITH YOUR NAUSEA MEDICATION  *UNUSUAL SHORTNESS OF BREATH  *UNUSUAL BRUISING OR BLEEDING  TENDERNESS IN MOUTH AND THROAT WITH OR WITHOUT PRESENCE OF ULCERS  *URINARY PROBLEMS  *BOWEL PROBLEMS  UNUSUAL RASH Items with * indicate a potential emergency and should be followed up as soon as possible.  Feel free to call the clinic you have any questions or concerns. The clinic phone number is [phone].    

## 2014-06-21 NOTE — Progress Notes (Signed)
Completed with patient in the infusion room.  Weight documented as 128.3 pounds August 3.  Decreased from 134.9 pounds July 20, and 139.2 pounds June 30.  Patient reports she does not tolerate oral nutrition supplements.  They cause nausea, vomiting.  Complains of taste alterations and diarrhea.  Nutrition diagnosis: Food and nutrition related knowledge deficit continues.  Intervention: Patient educated to continue small frequent meals and snacks with protein every meal and snack. Reviewed foods for patient to try and include in her diet to firm up stools. Recommend patient comply with medications per physician order. Recommended patient discussed diarrhea with physician. Questions answered and teach back method used.  Monitoring, evaluation, goals: Patient has been unable to tolerate increased calories and protein, and has had weight loss.  Next visit: Tuesday August 18, during chemotherapy.   **Disclaimer: This note was dictated with voice recognition software. Similar sounding words can inadvertently be transcribed and this note may contain transcription errors which may not have been corrected upon publication of note.**

## 2014-06-23 ENCOUNTER — Ambulatory Visit: Payer: Medicaid Other

## 2014-07-05 ENCOUNTER — Ambulatory Visit: Payer: Medicaid Other

## 2014-07-05 ENCOUNTER — Encounter: Payer: Medicaid Other | Admitting: Nutrition

## 2014-07-05 ENCOUNTER — Other Ambulatory Visit: Payer: Medicaid Other

## 2014-07-06 ENCOUNTER — Other Ambulatory Visit (HOSPITAL_BASED_OUTPATIENT_CLINIC_OR_DEPARTMENT_OTHER): Payer: Medicaid Other

## 2014-07-06 ENCOUNTER — Ambulatory Visit (HOSPITAL_BASED_OUTPATIENT_CLINIC_OR_DEPARTMENT_OTHER): Payer: Medicaid Other

## 2014-07-06 VITALS — BP 119/77 | HR 116 | Temp 97.5°F | Resp 18

## 2014-07-06 DIAGNOSIS — C259 Malignant neoplasm of pancreas, unspecified: Secondary | ICD-10-CM

## 2014-07-06 DIAGNOSIS — C250 Malignant neoplasm of head of pancreas: Secondary | ICD-10-CM

## 2014-07-06 DIAGNOSIS — Z5111 Encounter for antineoplastic chemotherapy: Secondary | ICD-10-CM

## 2014-07-06 LAB — CBC WITH DIFFERENTIAL/PLATELET
BASO%: 0.2 % (ref 0.0–2.0)
BASOS ABS: 0 10*3/uL (ref 0.0–0.1)
EOS%: 2 % (ref 0.0–7.0)
Eosinophils Absolute: 0.1 10*3/uL (ref 0.0–0.5)
HEMATOCRIT: 32.3 % — AB (ref 34.8–46.6)
HEMOGLOBIN: 9.9 g/dL — AB (ref 11.6–15.9)
LYMPH%: 9.8 % — ABNORMAL LOW (ref 14.0–49.7)
MCH: 24.3 pg — ABNORMAL LOW (ref 25.1–34.0)
MCHC: 30.7 g/dL — AB (ref 31.5–36.0)
MCV: 79.4 fL — ABNORMAL LOW (ref 79.5–101.0)
MONO#: 0.3 10*3/uL (ref 0.1–0.9)
MONO%: 7.7 % (ref 0.0–14.0)
NEUT#: 3.5 10*3/uL (ref 1.5–6.5)
NEUT%: 80.3 % — AB (ref 38.4–76.8)
PLATELETS: 328 10*3/uL (ref 145–400)
RBC: 4.07 10*6/uL (ref 3.70–5.45)
RDW: 16.8 % — ABNORMAL HIGH (ref 11.2–14.5)
WBC: 4.4 10*3/uL (ref 3.9–10.3)
lymph#: 0.4 10*3/uL — ABNORMAL LOW (ref 0.9–3.3)

## 2014-07-06 LAB — COMPREHENSIVE METABOLIC PANEL (CC13)
ALT: 14 U/L (ref 0–55)
ANION GAP: 11 meq/L (ref 3–11)
AST: 27 U/L (ref 5–34)
Albumin: 3.4 g/dL — ABNORMAL LOW (ref 3.5–5.0)
Alkaline Phosphatase: 434 U/L — ABNORMAL HIGH (ref 40–150)
BILIRUBIN TOTAL: 0.49 mg/dL (ref 0.20–1.20)
BUN: 11.3 mg/dL (ref 7.0–26.0)
CALCIUM: 9.8 mg/dL (ref 8.4–10.4)
CO2: 21 meq/L — AB (ref 22–29)
CREATININE: 0.7 mg/dL (ref 0.6–1.1)
Chloride: 106 mEq/L (ref 98–109)
Glucose: 124 mg/dl (ref 70–140)
Potassium: 3.7 mEq/L (ref 3.5–5.1)
Sodium: 138 mEq/L (ref 136–145)
Total Protein: 8.4 g/dL — ABNORMAL HIGH (ref 6.4–8.3)

## 2014-07-06 LAB — CANCER ANTIGEN 19-9: CA 19-9: 182.5 U/mL — ABNORMAL HIGH (ref <35.0)

## 2014-07-06 MED ORDER — PACLITAXEL PROTEIN-BOUND CHEMO INJECTION 100 MG
100.0000 mg/m2 | Freq: Once | INTRAVENOUS | Status: AC
  Administered 2014-07-06: 175 mg via INTRAVENOUS
  Filled 2014-07-06: qty 35

## 2014-07-06 MED ORDER — HEPARIN SOD (PORK) LOCK FLUSH 100 UNIT/ML IV SOLN
500.0000 [IU] | Freq: Once | INTRAVENOUS | Status: AC | PRN
  Administered 2014-07-06: 500 [IU]
  Filled 2014-07-06: qty 5

## 2014-07-06 MED ORDER — ONDANSETRON 8 MG/NS 50 ML IVPB
INTRAVENOUS | Status: AC
  Filled 2014-07-06: qty 8

## 2014-07-06 MED ORDER — ONDANSETRON 8 MG/50ML IVPB (CHCC)
8.0000 mg | Freq: Once | INTRAVENOUS | Status: AC
  Administered 2014-07-06: 8 mg via INTRAVENOUS

## 2014-07-06 MED ORDER — SODIUM CHLORIDE 0.9 % IV SOLN
Freq: Once | INTRAVENOUS | Status: AC
  Administered 2014-07-06: 10:00:00 via INTRAVENOUS

## 2014-07-06 MED ORDER — DEXAMETHASONE SODIUM PHOSPHATE 10 MG/ML IJ SOLN
10.0000 mg | Freq: Once | INTRAMUSCULAR | Status: AC
  Administered 2014-07-06: 10 mg via INTRAVENOUS

## 2014-07-06 MED ORDER — GEMCITABINE HCL CHEMO INJECTION 1 GM/26.3ML
700.0000 mg/m2 | Freq: Once | INTRAVENOUS | Status: AC
  Administered 2014-07-06: 1178 mg via INTRAVENOUS
  Filled 2014-07-06: qty 30.98

## 2014-07-06 MED ORDER — DEXAMETHASONE SODIUM PHOSPHATE 10 MG/ML IJ SOLN
INTRAMUSCULAR | Status: AC
  Filled 2014-07-06: qty 1

## 2014-07-06 MED ORDER — SODIUM CHLORIDE 0.9 % IJ SOLN
10.0000 mL | INTRAMUSCULAR | Status: DC | PRN
  Administered 2014-07-06: 10 mL
  Filled 2014-07-06: qty 10

## 2014-07-06 NOTE — Patient Instructions (Signed)
[facility] Discharge Instructions for Patients Receiving Chemotherapy  Today you received the following chemotherapy agents Gemcitabine/Abraxane.   To help prevent nausea and vomiting after your treatment, we encourage you to take your nausea medication as directed.    If you develop nausea and vomiting that is not controlled by your nausea medication, call the clinic.   BELOW ARE SYMPTOMS THAT SHOULD BE REPORTED IMMEDIATELY:  *FEVER GREATER THAN 100.5 F  *CHILLS WITH OR WITHOUT FEVER  NAUSEA AND VOMITING THAT IS NOT CONTROLLED WITH YOUR NAUSEA MEDICATION  *UNUSUAL SHORTNESS OF BREATH  *UNUSUAL BRUISING OR BLEEDING  TENDERNESS IN MOUTH AND THROAT WITH OR WITHOUT PRESENCE OF ULCERS  *URINARY PROBLEMS  *BOWEL PROBLEMS  UNUSUAL RASH Items with * indicate a potential emergency and should be followed up as soon as possible.  Feel free to call the clinic you have any questions or concerns. The clinic phone number is [phone].

## 2014-07-17 ENCOUNTER — Other Ambulatory Visit: Payer: Self-pay | Admitting: Oncology

## 2014-07-18 ENCOUNTER — Other Ambulatory Visit: Payer: Medicaid Other

## 2014-07-18 ENCOUNTER — Ambulatory Visit: Payer: Medicaid Other | Admitting: Oncology

## 2014-07-19 ENCOUNTER — Encounter: Payer: Medicaid Other | Admitting: Nutrition

## 2014-07-19 ENCOUNTER — Telehealth: Payer: Self-pay | Admitting: *Deleted

## 2014-07-19 ENCOUNTER — Ambulatory Visit: Payer: Medicaid Other

## 2014-07-19 ENCOUNTER — Other Ambulatory Visit: Payer: Medicaid Other

## 2014-07-19 NOTE — Telephone Encounter (Signed)
Patient's daughter called and wanted to rescheudle her missed appt from yesterday and today. I have forwarded her to the desk RN.  JMW

## 2014-07-20 ENCOUNTER — Telehealth: Payer: Self-pay | Admitting: Nurse Practitioner

## 2014-07-20 ENCOUNTER — Telehealth: Payer: Self-pay | Admitting: *Deleted

## 2014-07-20 NOTE — Telephone Encounter (Signed)
Pt cld back ok w/09/18 apt, mailing out sch and pt thinks I hung up on her, I did because she left me for a long time talking to someone else, tried to call back but line was busy.Marland Kitchen..KJ

## 2014-07-20 NOTE — Telephone Encounter (Signed)
Mailed pt updated schedule, tried calling no answer and nv.Marland Kitchen..KJ

## 2014-07-20 NOTE — Telephone Encounter (Signed)
Cld pt back to r/s pt states she can't do that day due to transporation , I advised pt she didn't need to keep putting off, pt told me to hold and went to complain to someone and kept me on hold, so I hung up due to pt's in waiting area...Marland KitchenMarland KitchenKJ

## 2014-07-20 NOTE — Telephone Encounter (Signed)
Per staff message I have scheduled appts. Scheduler advised

## 2014-07-28 ENCOUNTER — Telehealth: Payer: Self-pay | Admitting: *Deleted

## 2014-07-28 NOTE — Telephone Encounter (Signed)
Message from [place] with Hospice admissions reporting they received a referral from [facility]. Need to confirm there is no plan for further chemo, will Dr. Benay Spice be the attending for hospice? Per Dr. Benay Spice: Will need to see pt in office. Next visit 9/18.

## 2014-07-29 ENCOUNTER — Telehealth: Payer: Self-pay | Admitting: Nurse Practitioner

## 2014-07-29 ENCOUNTER — Telehealth: Payer: Self-pay | Admitting: *Deleted

## 2014-07-29 NOTE — Telephone Encounter (Signed)
Spoke with [name] [name] with hospice admissions. We will make referral if appropriate and pt is amenable to this at next visit. She voiced appreciation for call.

## 2014-08-02 ENCOUNTER — Emergency Department (HOSPITAL_COMMUNITY): Payer: Medicaid Other

## 2014-08-02 ENCOUNTER — Encounter (HOSPITAL_COMMUNITY): Payer: Self-pay | Admitting: Emergency Medicine

## 2014-08-02 ENCOUNTER — Emergency Department (HOSPITAL_COMMUNITY)
Admission: EM | Admit: 2014-08-02 | Discharge: 2014-08-02 | Disposition: A | Discharge status: Home / Self Care | Payer: Medicaid Other | Attending: Emergency Medicine | Admitting: Emergency Medicine

## 2014-08-02 ENCOUNTER — Telehealth (HOSPITAL_BASED_OUTPATIENT_CLINIC_OR_DEPARTMENT_OTHER): Payer: Self-pay | Admitting: Emergency Medicine

## 2014-08-02 DIAGNOSIS — E785 Hyperlipidemia, unspecified: Secondary | ICD-10-CM | POA: Diagnosis not present

## 2014-08-02 DIAGNOSIS — M51379 Other intervertebral disc degeneration, lumbosacral region without mention of lumbar back pain or lower extremity pain: Secondary | ICD-10-CM | POA: Insufficient documentation

## 2014-08-02 DIAGNOSIS — Z79899 Other long term (current) drug therapy: Secondary | ICD-10-CM | POA: Insufficient documentation

## 2014-08-02 DIAGNOSIS — Z7982 Long term (current) use of aspirin: Secondary | ICD-10-CM | POA: Insufficient documentation

## 2014-08-02 DIAGNOSIS — C259 Malignant neoplasm of pancreas, unspecified: Secondary | ICD-10-CM | POA: Diagnosis not present

## 2014-08-02 DIAGNOSIS — K859 Acute pancreatitis without necrosis or infection, unspecified: Secondary | ICD-10-CM | POA: Insufficient documentation

## 2014-08-02 DIAGNOSIS — M5137 Other intervertebral disc degeneration, lumbosacral region: Secondary | ICD-10-CM | POA: Insufficient documentation

## 2014-08-02 DIAGNOSIS — Z794 Long term (current) use of insulin: Secondary | ICD-10-CM | POA: Insufficient documentation

## 2014-08-02 DIAGNOSIS — E119 Type 2 diabetes mellitus without complications: Secondary | ICD-10-CM | POA: Insufficient documentation

## 2014-08-02 DIAGNOSIS — F411 Generalized anxiety disorder: Secondary | ICD-10-CM | POA: Diagnosis not present

## 2014-08-02 DIAGNOSIS — R1011 Right upper quadrant pain: Secondary | ICD-10-CM | POA: Diagnosis present

## 2014-08-02 DIAGNOSIS — G8929 Other chronic pain: Secondary | ICD-10-CM | POA: Diagnosis not present

## 2014-08-02 DIAGNOSIS — H9190 Unspecified hearing loss, unspecified ear: Secondary | ICD-10-CM | POA: Insufficient documentation

## 2014-08-02 DIAGNOSIS — Z87891 Personal history of nicotine dependence: Secondary | ICD-10-CM | POA: Insufficient documentation

## 2014-08-02 DIAGNOSIS — Z8673 Personal history of transient ischemic attack (TIA), and cerebral infarction without residual deficits: Secondary | ICD-10-CM | POA: Diagnosis not present

## 2014-08-02 DIAGNOSIS — Z872 Personal history of diseases of the skin and subcutaneous tissue: Secondary | ICD-10-CM | POA: Insufficient documentation

## 2014-08-02 DIAGNOSIS — Z862 Personal history of diseases of the blood and blood-forming organs and certain disorders involving the immune mechanism: Secondary | ICD-10-CM | POA: Diagnosis not present

## 2014-08-02 DIAGNOSIS — K219 Gastro-esophageal reflux disease without esophagitis: Secondary | ICD-10-CM | POA: Insufficient documentation

## 2014-08-02 DIAGNOSIS — I10 Essential (primary) hypertension: Secondary | ICD-10-CM | POA: Insufficient documentation

## 2014-08-02 LAB — URINALYSIS, ROUTINE W REFLEX MICROSCOPIC
BILIRUBIN URINE: NEGATIVE
GLUCOSE, UA: NEGATIVE mg/dL
Ketones, ur: NEGATIVE mg/dL
Leukocytes, UA: NEGATIVE
Nitrite: NEGATIVE
Protein, ur: NEGATIVE mg/dL
SPECIFIC GRAVITY, URINE: 1.017 (ref 1.005–1.030)
Urobilinogen, UA: 1 mg/dL (ref 0.0–1.0)
pH: 7 (ref 5.0–8.0)

## 2014-08-02 LAB — COMPREHENSIVE METABOLIC PANEL
ALBUMIN: 3.2 g/dL — AB (ref 3.5–5.2)
ALT: 39 U/L — AB (ref 0–35)
ANION GAP: 12 (ref 5–15)
AST: 31 U/L (ref 0–37)
Alkaline Phosphatase: 497 U/L — ABNORMAL HIGH (ref 39–117)
BUN: 16 mg/dL (ref 6–23)
CHLORIDE: 102 meq/L (ref 96–112)
CO2: 23 mEq/L (ref 19–32)
Calcium: 9.5 mg/dL (ref 8.4–10.5)
Creatinine, Ser: 0.51 mg/dL (ref 0.50–1.10)
GFR calc Af Amer: >90 mL/min (ref >90)
GFR calc non Af Amer: >90 mL/min (ref >90)
GLUCOSE: 129 mg/dL — AB (ref 70–99)
POTASSIUM: 3.9 meq/L (ref 3.7–5.3)
SODIUM: 137 meq/L (ref 137–147)
TOTAL PROTEIN: 7.8 g/dL (ref 6.0–8.3)
Total Bilirubin: 0.3 mg/dL (ref 0.3–1.2)

## 2014-08-02 LAB — CBC WITH DIFFERENTIAL/PLATELET
BASOS PCT: 0 % (ref 0–1)
Basophils Absolute: 0 10*3/uL (ref 0.0–0.1)
EOS ABS: 0.1 10*3/uL (ref 0.0–0.7)
Eosinophils Relative: 2 % (ref 0–5)
HCT: 27.4 % — ABNORMAL LOW (ref 36.0–46.0)
HEMOGLOBIN: 8.7 g/dL — AB (ref 12.0–15.0)
LYMPHS ABS: 0.5 10*3/uL — AB (ref 0.7–4.0)
Lymphocytes Relative: 6 % — ABNORMAL LOW (ref 12–46)
MCH: 24.5 pg — ABNORMAL LOW (ref 26.0–34.0)
MCHC: 31.8 g/dL (ref 30.0–36.0)
MCV: 77.2 fL — ABNORMAL LOW (ref 78.0–100.0)
MONOS PCT: 10 % (ref 3–12)
Monocytes Absolute: 0.8 10*3/uL (ref 0.1–1.0)
NEUTROS ABS: 6.7 10*3/uL (ref 1.7–7.7)
Neutrophils Relative %: 82 % — ABNORMAL HIGH (ref 43–77)
PLATELETS: 331 10*3/uL (ref 150–400)
RBC: 3.55 MIL/uL — AB (ref 3.87–5.11)
RDW: 18.6 % — ABNORMAL HIGH (ref 11.5–15.5)
WBC: 8.1 10*3/uL (ref 4.0–10.5)

## 2014-08-02 LAB — URINE MICROSCOPIC-ADD ON

## 2014-08-02 LAB — LIPASE, BLOOD: Lipase: 6 U/L — ABNORMAL LOW (ref 11–59)

## 2014-08-02 MED ORDER — HYDROMORPHONE HCL PF 1 MG/ML IJ SOLN
1.0000 mg | Freq: Once | INTRAMUSCULAR | Status: AC
  Administered 2014-08-02: 1 mg via INTRAVENOUS
  Filled 2014-08-02: qty 1

## 2014-08-02 MED ORDER — FENTANYL CITRATE 0.05 MG/ML IJ SOLN
50.0000 ug | Freq: Once | INTRAMUSCULAR | Status: AC
  Administered 2014-08-02: 50 ug via INTRAVENOUS
  Filled 2014-08-02: qty 2

## 2014-08-02 MED ORDER — HYDROMORPHONE HCL PF 1 MG/ML IJ SOLN
1.0000 mg | Freq: Once | INTRAMUSCULAR | Status: AC
Start: 2014-08-02 — End: 2014-08-02
  Administered 2014-08-02: 1 mg via INTRAVENOUS
  Filled 2014-08-02: qty 1

## 2014-08-02 MED ORDER — IOHEXOL 300 MG/ML  SOLN
100.0000 mL | Freq: Once | INTRAMUSCULAR | Status: AC | PRN
  Administered 2014-08-02: 100 mL via INTRAVENOUS

## 2014-08-02 MED ORDER — HEPARIN SOD (PORK) LOCK FLUSH 100 UNIT/ML IV SOLN
500.0000 [IU] | Freq: Once | INTRAVENOUS | Status: DC
  Filled 2014-08-02: qty 5

## 2014-08-02 MED ORDER — IOHEXOL 300 MG/ML  SOLN
50.0000 mL | Freq: Once | INTRAMUSCULAR | Status: AC | PRN
  Administered 2014-08-02: 50 mL via ORAL

## 2014-08-02 NOTE — ED Notes (Signed)
Pt has agreed to having her port accessed. Pt also requesting I/O cath for obtaining urine.

## 2014-08-02 NOTE — ED Notes (Signed)
Per EMS, pt has hx of pancreatic CA and has been having rt chronic flank pain that has been increasing over the last three day. Pain is beginning to radiate to her rt abdomen.

## 2014-08-02 NOTE — ED Notes (Signed)
Pt has not tolerated staff well.  Pt is very short and sometimes rude to staff.    Will get urine later.

## 2014-08-02 NOTE — ED Notes (Signed)
Pt in CT exam

## 2014-08-02 NOTE — ED Provider Notes (Signed)
CSN: [id number]     Arrival date & time 08/02/14  0405 History   First MD Initiated Contact with Patient 08/02/14 [phone]     Chief Complaint  Patient presents with  . Flank Pain     (Consider location/radiation/quality/duration/timing/severity/associated sxs/prior Treatment) HPI Patient presents with concerns of ongoing right-sided abdominal pain.  Patient has chronic pain, admitted to pancreatic cancer for which she is currently receiving chemotherapy. She notes over the past 3 days she has had worsening of her typical pain, and a focal area about the right lateral abdomen, with minimal radiation, both increasing sharpness, burning. There is associated nausea, anorexia, weakness. No relief with home on medication, including Neurontin, narcotics. Note no fever, chills, syncope, other chest pain, dyspnea, no urinary changes.  Past Medical History  Diagnosis Date  . DM (diabetes mellitus), type 2     requires insulin  . Hypertension   . Depression   . Anxiety   . Hyperlipidemia   . Pancreatitis   . GERD (gastroesophageal reflux disease)   . Pancreatic cancer 05/2013    adenocarcinoma on ERCP/FNA  . Hearing impaired     lost the last of her hearing aids and cn not get another one.  hearing is better via right ear.   Marland Kitchen History of radiation therapy 07/20/13-08/09/13    Pancreas 37.5Gy  . DDD (degenerative disc disease), lumbar   . Chronic back pain   . Stroke   . Anemia   . Ulcer 01/2014    radiation gastroduodenitis with duodenal ulcer.    Past Surgical History  Procedure Laterality Date  . Appendectomy    . Ercp N/A 05/24/2013    Procedure: ENDOSCOPIC RETROGRADE CHOLANGIOPANCREATOGRAPHY (ERCP);  Surgeon: Milus Banister, MD;  Location: [facility];  Service: Endoscopy;  Laterality: N/A;  MAC vs. general per anesthesia   . Tubal ligation    . Endoscopic retrograde cholangiopancreatography (ercp) with propofol N/A 10/21/2013    Procedure: ENDOSCOPIC RETROGRADE CHOLANGIOPANCREATOGRAPHY  (ERCP) WITH PROPOFOL;  Surgeon: Milus Banister, MD;  Location: [facility];  Service: Endoscopy;  Laterality: N/A;  Stent removal   . Biliary stent placement N/A 10/21/2013    Procedure: BILIARY STENT PLACEMENT;  Surgeon: Milus Banister, MD;  Location: [facility];  Service: Endoscopy;  Laterality: N/A;  . Tee without cardioversion N/A 12/29/2013    Procedure: TRANSESOPHAGEAL ECHOCARDIOGRAM (TEE);  Surgeon: Dorothy Spark, MD;  Location: [facility];  Service: Cardiovascular;  Laterality: N/A;  . Eus N/A 02/10/2014    Procedure: UPPER ENDOSCOPIC ULTRASOUND (EUS) LINEAR;  Surgeon: Milus Banister, MD;  Location: [facility];  Service: Endoscopy;  Laterality: N/A;  celiac plexus neurolysis  . Bile duct stent placement     Family History  Problem Relation Age of Onset  . Diabetes type II Mother   . Hypertension Mother   . Heart attack Father   . HIV/AIDS Brother    History  Substance Use Topics  . Smoking status: Former Smoker -- 0.50 packs/day for 42 years    Quit date: 05/28/2013  . Smokeless tobacco: Never Used  . Alcohol Use: No   OB History   Grav Para Term Preterm Abortions TAB SAB Ect Mult Living                 Review of Systems  Constitutional:       Per HPI, otherwise negative  HENT:       Per HPI, otherwise negative  Respiratory:       Per  HPI, otherwise negative  Cardiovascular:       Per HPI, otherwise negative  Gastrointestinal: Negative for vomiting.  Endocrine:       Negative aside from HPI  Genitourinary:       Neg aside from HPI   Musculoskeletal:       Per HPI, otherwise negative  Skin: Negative.   Neurological: Negative for syncope.      Allergies  Betadine  Home Medications   Prior to Admission medications   Medication Sig Start Date End Date Taking? Authorizing Provider  amLODipine (NORVASC) 10 MG tablet Take 10 mg by mouth every morning.   Yes Historical Provider, MD  aspirin EC 81 MG tablet Take 81 mg by mouth daily.   Yes  Historical Provider, MD  cetirizine (ZYRTEC) 10 MG tablet Take 10 mg by mouth daily as needed for allergies.    Yes Historical Provider, MD  docusate sodium (COLACE) 100 MG capsule Take 1 capsule (100 mg total) by mouth 2 (two) times daily as needed for mild constipation. 03/25/14  Yes Kalman Drape, MD  gabapentin (NEURONTIN) 300 MG capsule Take 300 mg by mouth 3 (three) times daily.   Yes Historical Provider, MD  ibuprofen (ADVIL) 200 MG tablet Take 800 mg by mouth every 6 (six) hours as needed for mild pain or moderate pain.    Yes Historical Provider, MD  insulin aspart (NOVOLOG) 100 UNIT/ML injection Inject 10 Units into the skin 3 (three) times daily with meals.   Yes Historical Provider, MD  insulin glargine (LANTUS) 100 UNIT/ML injection Inject 30 Units into the skin at bedtime.    Yes Historical Provider, MD  lidocaine-prilocaine (EMLA) cream Apply 1 application topically as needed. 06/20/14  Yes Owens Shark, NP  lipase/protease/amylase (CREON-12/PANCREASE) 12000 UNITS CPEP capsule Take 1 capsule by mouth 3 (three) times daily before meals. 02/16/14  Yes Owens Shark, NP  lisinopril (PRINIVIL,ZESTRIL) 40 MG tablet Take 40 mg by mouth every morning.    Yes Historical Provider, MD  metoCLOPramide (REGLAN) 5 MG tablet Take 5 mg by mouth 3 (three) times daily before meals.    Yes Historical Provider, MD  metoprolol tartrate (LOPRESSOR) 25 MG tablet Take 25 mg by mouth 2 (two) times daily.   Yes Historical Provider, MD  mirtazapine (REMERON) 15 MG tablet Take 1 tablet (15 mg total) by mouth at bedtime. 06/20/14  Yes Owens Shark, NP  Oxycodone HCl 10 MG TABS Take 1 tablet (10 mg total) by mouth every 4 (four) hours as needed (severe pain). 06/20/14  Yes Owens Shark, NP  pantoprazole (PROTONIX) 40 MG tablet Take 40 mg by mouth daily.   Yes Historical Provider, MD  potassium chloride (K-DUR,KLOR-CON) 10 MEQ tablet Take 10 mEq by mouth daily.   Yes Historical Provider, MD  simvastatin (ZOCOR) 10 MG  tablet Take 10 mg by mouth at bedtime.   Yes Historical Provider, MD  sucralfate (CARAFATE) 1 GM/10ML suspension Take 1 g by mouth 2 (two) times daily.   Yes Historical Provider, MD  ondansetron (ZOFRAN ODT) 8 MG disintegrating tablet Take 1 tablet (8 mg total) by mouth every 8 (eight) hours as needed for nausea or vomiting. 03/25/14   Kalman Drape, MD  polyethylene glycol [facility] / Floria Raveling) packet Take 17 g by mouth daily as needed for mild constipation or moderate constipation. 03/25/14   Kalman Drape, MD  simethicone (MYLICON) 983 MG chewable tablet Chew 125 mg by mouth every 6 (six) hours as  needed for flatulence.    Historical Provider, MD   BP 149/94  Pulse 107  Temp(Src) 97.8 F (36.6 C) (Oral)  Resp 20  SpO2 100% Physical Exam  Nursing note and vitals reviewed. Constitutional: She is oriented to person, place, and time. She appears well-developed and well-nourished. She has a sickly appearance. She appears ill. No distress.  HENT:  Head: Normocephalic and atraumatic.  Eyes: Conjunctivae and EOM are normal.  Cardiovascular: Normal rate and regular rhythm.   Pulmonary/Chest: Effort normal and breath sounds normal. No stridor. No respiratory distress.    Abdominal: She exhibits no distension.    Musculoskeletal: She exhibits no edema.  Neurological: She is alert and oriented to person, place, and time. No cranial nerve deficit.  Skin: Skin is warm and dry.  Psychiatric: She has a normal mood and affect.    ED Course  Procedures (including critical care time) Labs Review Labs Reviewed  CBC WITH DIFFERENTIAL - Abnormal; Notable for the following:    RBC 3.55 (*)    Hemoglobin 8.7 (*)    HCT 27.4 (*)    MCV 77.2 (*)    MCH 24.5 (*)    RDW 18.6 (*)    Neutrophils Relative % 82 (*)    Lymphocytes Relative 6 (*)    Lymphs Abs 0.5 (*)    All other components within normal limits  COMPREHENSIVE METABOLIC PANEL - Abnormal; Notable for the following:    Glucose, Bld 129 (*)      Albumin 3.2 (*)    ALT 39 (*)    Alkaline Phosphatase 497 (*)    All other components within normal limits  LIPASE, BLOOD - Abnormal; Notable for the following:    Lipase 6 (*)    All other components within normal limits  URINALYSIS, ROUTINE W REFLEX MICROSCOPIC - Abnormal; Notable for the following:    Hgb urine dipstick SMALL (*)    All other components within normal limits  URINE MICROSCOPIC-ADD ON - Abnormal; Notable for the following:    Squamous Epithelial / LPF FEW (*)    All other components within normal limits    Imaging Review Ct Abdomen Pelvis W Contrast  08/02/2014   CLINICAL DATA:  Pancreatic cancer. RIGHT flank pain. Increasing pain over the last 3 days.  EXAM: CT ABDOMEN AND PELVIS WITH CONTRAST  TECHNIQUE: Multidetector CT imaging of the abdomen and pelvis was performed using the standard protocol following bolus administration of intravenous contrast.  CONTRAST:  42mL OMNIPAQUE IOHEXOL 300 MG/ML SOLN, 143mL OMNIPAQUE IOHEXOL 300 MG/ML SOLN  COMPARISON:  03/07/2014.  FINDINGS: Musculoskeletal: No evidence of osseous metastatic disease. Hip osteoarthritis. Thoracolumbar spondylosis.  Lung Bases: Dependent atelectasis. Lingular atelectasis. Faint area of ground-glass attenuation in the RIGHT middle lobe on the most superior slice of imaging, nonspecific. No solid nodules in the lung bases. Pericardial lymph node measuring 8 mm short axis has enlarged compared to prior exam.  Liver: Triphasic evaluation of the liver shows pneumobilia associated with biliary stent and mild intrahepatic biliary ductal dilation. There is a new enhancing mass in the RIGHT hepatic lobe measuring 5 cm by 6 cm (axial image 27 series 2. ). This is hypervascular and becomes less apparent on portal venous phase and delayed images however still visible. The enhancement pattern is heterogeneous and peripheral and the appearance favors and infiltrating hepatic metastasis. Ascending cholangitis is considered  less likely.  Spleen: Heterogeneous per fusion of the spleen, probably secondary to cavernous transformation.  Gallbladder:  No calcified  stones.  Common bile duct:  Biliary stent appears unchanged.  No migration.  Pancreas: Unchanged. No discrete mass is identified however there is pancreatic tail atrophy and dilation of the pancreatic duct, similar to prior.  Adrenal glands: Thickening of the LEFT adrenal gland. RIGHT adrenal gland appears normal.  Kidneys: Unchanged LEFT interpolar rib renal cysts. Normal renal enhancement and delayed excretion of contrast.  Stomach: Thickening of the antrum of the stomach appears mildly improved compared to prior exam but does remain present.  Small bowel: No small bowel obstruction. Small to moderate volume ascites, mostly around the liver and in the anatomic pelvis. Small mesenteric lymph nodes are present. Compared to prior, a mesenteric lymph nodes appear more prominent.  Colon:   Large stool burden.  Pelvic Genitourinary: Fibroid uterus. Urinary bladder distended. Venous congestion in the pelvis associated with collateral formation.  Vasculature: Atherosclerosis. No acute vascular abnormality. Cavernous transformation of the portal vein, cavernous transformation of portal vein with portal venous collaterals in the upper abdomen. No definite esophageal varices. Gastric varices are present. Portosystemic shunting with is likely occurring through pelvic veins. Superior mesenteric vein is occluded at the level of the pancreatic head.  Body Wall: Unchanged periumbilical infiltration.  No discrete mass.  IMPRESSION: 1. Development of a new hypervascular mass measuring 5 cm x 6 cm in the RIGHT hepatic lobe. This is favored to represent infiltrating hepatic metastatic disease from pancreatic carcinoma. Ascending cholangitis considered unlikely. 2. Unchanged biliary stent with mild intrahepatic biliary ductal dilation, chronic. 3. Small to moderate volume of ascites, increased from  prior. 4. 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   Electronically Signed   By: Dereck Ligas M.D.   On: 08/02/2014 09:36    Review of patient's chart demonstrates that she is undergoing therapy for pancreatic cancer. One note suggests the patient has had contact with hospice staff, the patient adamantly denies this, states that she is scheduled for chemotherapy in 3 days.  10:27 AM Patient is asleep. She awakens easily, and we discussed the CT findings, as well as her cancer history. Given the sleeping status, her pain seems well controlled. She will follow up with oncology. MDM   Patient with pancreatic cancer now presents with worsening pain.  Patient is hemodynamically stable, awake, alert, with no evidence for sepsis, bacteremia, indicates decompensation. Patient's CT scan suggests progression of disease. This is consistent with note suggesting that the patient has had hospice discussion already. Given the resolution of her pain, she is discharged to follow up with oncology.  Carmin Muskrat, MD 08/02/14 1031

## 2014-08-02 NOTE — Discharge Instructions (Signed)
As discussed, it is important that you follow up as soon as possible with your physician for continued management of your condition. ° °If you develop any new, or concerning changes in your condition, please return to the emergency department immediately. ° °

## 2014-08-02 NOTE — ED Notes (Signed)
Pt has refused to have her port accessed until the EMLA cream she brought has time to take affect. Pt applied EMLA cream while RN was in the room. Will attempt to access the port in 30-45 minutes. Pt is highly agitated and yelling at staff. Reason is unknown.

## 2014-08-02 NOTE — ED Notes (Addendum)
Pt very abusive to staff.  Cussing at staff and not cooperative with  staff while attempting to meet patient needs.

## 2014-08-05 ENCOUNTER — Ambulatory Visit (HOSPITAL_BASED_OUTPATIENT_CLINIC_OR_DEPARTMENT_OTHER): Payer: Medicaid Other | Admitting: Nurse Practitioner

## 2014-08-05 ENCOUNTER — Telehealth: Payer: Self-pay | Admitting: Oncology

## 2014-08-05 ENCOUNTER — Ambulatory Visit: Payer: Medicaid Other | Admitting: Nutrition

## 2014-08-05 ENCOUNTER — Ambulatory Visit (HOSPITAL_BASED_OUTPATIENT_CLINIC_OR_DEPARTMENT_OTHER): Payer: Medicaid Other

## 2014-08-05 ENCOUNTER — Other Ambulatory Visit (HOSPITAL_BASED_OUTPATIENT_CLINIC_OR_DEPARTMENT_OTHER): Payer: Medicaid Other

## 2014-08-05 VITALS — BP 138/72 | HR 84 | Temp 98.4°F | Resp 18 | Ht 66.0 in | Wt 123.6 lb

## 2014-08-05 DIAGNOSIS — Z5111 Encounter for antineoplastic chemotherapy: Secondary | ICD-10-CM

## 2014-08-05 DIAGNOSIS — C250 Malignant neoplasm of head of pancreas: Secondary | ICD-10-CM

## 2014-08-05 DIAGNOSIS — C259 Malignant neoplasm of pancreas, unspecified: Secondary | ICD-10-CM

## 2014-08-05 LAB — COMPREHENSIVE METABOLIC PANEL (CC13)
ALT: 20 U/L (ref 0–55)
AST: 21 U/L (ref 5–34)
Albumin: 3.1 g/dL — ABNORMAL LOW (ref 3.5–5.0)
Alkaline Phosphatase: 396 U/L — ABNORMAL HIGH (ref 40–150)
Anion Gap: 7 mEq/L (ref 3–11)
BUN: 15.2 mg/dL (ref 7.0–26.0)
CALCIUM: 9 mg/dL (ref 8.4–10.4)
CHLORIDE: 104 meq/L (ref 98–109)
CO2: 24 mEq/L (ref 22–29)
CREATININE: 0.7 mg/dL (ref 0.6–1.1)
Glucose: 133 mg/dl (ref 70–140)
Potassium: 3.8 mEq/L (ref 3.5–5.1)
SODIUM: 136 meq/L (ref 136–145)
TOTAL PROTEIN: 7.8 g/dL (ref 6.4–8.3)
Total Bilirubin: 0.49 mg/dL (ref 0.20–1.20)

## 2014-08-05 LAB — CBC WITH DIFFERENTIAL/PLATELET
BASO%: 0.7 % (ref 0.0–2.0)
Basophils Absolute: 0.1 10*3/uL (ref 0.0–0.1)
EOS%: 1.8 % (ref 0.0–7.0)
Eosinophils Absolute: 0.2 10*3/uL (ref 0.0–0.5)
HCT: 30 % — ABNORMAL LOW (ref 34.8–46.6)
HGB: 9.4 g/dL — ABNORMAL LOW (ref 11.6–15.9)
LYMPH%: 5.6 % — ABNORMAL LOW (ref 14.0–49.7)
MCH: 24.4 pg — AB (ref 25.1–34.0)
MCHC: 31.5 g/dL (ref 31.5–36.0)
MCV: 77.6 fL — ABNORMAL LOW (ref 79.5–101.0)
MONO#: 0.7 10*3/uL (ref 0.1–0.9)
MONO%: 7.8 % (ref 0.0–14.0)
NEUT#: 7.2 10*3/uL — ABNORMAL HIGH (ref 1.5–6.5)
NEUT%: 84.1 % — ABNORMAL HIGH (ref 38.4–76.8)
Platelets: 318 10*3/uL (ref 145–400)
RBC: 3.86 10*6/uL (ref 3.70–5.45)
RDW: 19.7 % — ABNORMAL HIGH (ref 11.2–14.5)
WBC: 8.5 10*3/uL (ref 3.9–10.3)
lymph#: 0.5 10*3/uL — ABNORMAL LOW (ref 0.9–3.3)

## 2014-08-05 LAB — CANCER ANTIGEN 19-9: CA 19-9: 472.2 U/mL — ABNORMAL HIGH (ref <35.0)

## 2014-08-05 MED ORDER — PACLITAXEL PROTEIN-BOUND CHEMO INJECTION 100 MG
100.0000 mg/m2 | Freq: Once | INTRAVENOUS | Status: AC
  Administered 2014-08-05: 175 mg via INTRAVENOUS
  Filled 2014-08-05: qty 35

## 2014-08-05 MED ORDER — DEXAMETHASONE SODIUM PHOSPHATE 10 MG/ML IJ SOLN
10.0000 mg | Freq: Once | INTRAMUSCULAR | Status: AC
  Administered 2014-08-05: 10 mg via INTRAVENOUS

## 2014-08-05 MED ORDER — SODIUM CHLORIDE 0.9 % IV SOLN
Freq: Once | INTRAVENOUS | Status: AC
  Administered 2014-08-05: 12:00:00 via INTRAVENOUS

## 2014-08-05 MED ORDER — ONDANSETRON 8 MG/50ML IVPB (CHCC)
8.0000 mg | Freq: Once | INTRAVENOUS | Status: AC
  Administered 2014-08-05: 8 mg via INTRAVENOUS

## 2014-08-05 MED ORDER — HYDROCODONE-ACETAMINOPHEN 5-325 MG PO TABS: 1.0000 | ORAL_TABLET | ORAL | Status: DC | PRN

## 2014-08-05 MED ORDER — HEPARIN SOD (PORK) LOCK FLUSH 100 UNIT/ML IV SOLN
500.0000 [IU] | Freq: Once | INTRAVENOUS | Status: AC | PRN
  Administered 2014-08-05: 500 [IU]
  Filled 2014-08-05: qty 5

## 2014-08-05 MED ORDER — SODIUM CHLORIDE 0.9 % IV SOLN
700.0000 mg/m2 | Freq: Once | INTRAVENOUS | Status: AC
  Administered 2014-08-05: 1178 mg via INTRAVENOUS
  Filled 2014-08-05: qty 30.98

## 2014-08-05 MED ORDER — OXYCODONE HCL 10 MG PO TABS: 10.0000 mg | ORAL_TABLET | ORAL | Status: DC | PRN

## 2014-08-05 MED ORDER — DEXAMETHASONE SODIUM PHOSPHATE 10 MG/ML IJ SOLN
INTRAMUSCULAR | Status: AC
  Filled 2014-08-05: qty 1

## 2014-08-05 MED ORDER — ONDANSETRON 8 MG/NS 50 ML IVPB
INTRAVENOUS | Status: AC
  Filled 2014-08-05: qty 8

## 2014-08-05 MED ORDER — SODIUM CHLORIDE 0.9 % IJ SOLN
10.0000 mL | INTRAMUSCULAR | Status: DC | PRN
  Administered 2014-08-05: 10 mL
  Filled 2014-08-05: qty 10

## 2014-08-05 NOTE — Patient Instructions (Signed)
[place] Discharge Instructions for Patients Receiving Chemotherapy  Today you received the following chemotherapy agents Abraxane, Gemzar.  To help prevent nausea and vomiting after your treatment, we encourage you to take your nausea medication Zofran 8 mg ODT as ordered by provider.   If you develop nausea and vomiting that is not controlled by your nausea medication, call the clinic.   BELOW ARE SYMPTOMS THAT SHOULD BE REPORTED IMMEDIATELY:  *FEVER GREATER THAN 100.5 F  *CHILLS WITH OR WITHOUT FEVER  NAUSEA AND VOMITING THAT IS NOT CONTROLLED WITH YOUR NAUSEA MEDICATION  *UNUSUAL SHORTNESS OF BREATH  *UNUSUAL BRUISING OR BLEEDING  TENDERNESS IN MOUTH AND THROAT WITH OR WITHOUT PRESENCE OF ULCERS  *URINARY PROBLEMS  *BOWEL PROBLEMS  UNUSUAL RASH Items with * indicate a potential emergency and should be followed up as soon as possible.  Feel free to call the clinic you have any questions or concerns. The clinic phone number is [phone].

## 2014-08-05 NOTE — Progress Notes (Signed)
Nutrition followup completed with patient in infusion.  Weight continues to decrease and was documented as 123.6 pounds down from 128.3 pounds August 3.  Patient denies difficulty eating.  She does not tolerate any oral nutrition supplements.  They cause nausea and vomiting, and are " too much trouble".  Patient reports taste alterations and diarrhea have improved.  Nutrition diagnosis: Food and nutrition related knowledge deficit resolved.  Encouraged patient to increase oral intake at meals and snacks to minimize further weight loss.  Patient to contact me for further questions or concerns.   **Disclaimer: This note was dictated with voice recognition software. Similar sounding words can inadvertently be transcribed and this note may contain transcription errors which may not have been corrected upon publication of note.**

## 2014-08-05 NOTE — Progress Notes (Addendum)
[place] OFFICE PROGRESS NOTE   Diagnosis:  Pancreas cancer  INTERVAL HISTORY:   [name] returns after missing a recent followup visit. She was last treated with gemcitabine/Abraxane on 07/06/2014. She was seen in the emergency Department on 08/02/2014 with increased pain. CT scan showed a new enhancing mass in the right hepatic lobe measuring 5 cm x 6 cm. No discrete pancreas mass was identified. There was pancreatic atrophy and dilatation of the pancreatic duct similar to prior. The comparison scan was done 03/07/2014.  She reports increased pain at the right abdomen and flank. She takes oxycodone and hydrocodone as needed. She has noted generalized pruritus which she feels is secondary to the pain medication. She denies nausea/vomiting. No constipation. She reports a poor appetite.  Objective:  Vital signs in last 24 hours:  Blood pressure 138/72, pulse 84, temperature 98.4 F (36.9 C), temperature source Oral, resp. rate 18, height 5\' 6"  (1.676 m), weight 123 lb 9.6 oz (56.065 kg).    HEENT: No thrush or ulcers. Resp: Lungs clear bilaterally. Cardio: Regular rate and rhythm. GI: Abdomen is distended. Tender over the right upper abdomen. No hepatomegaly. No mass. Vascular: No leg edema. Neuro: Alert and oriented. Hard of hearing.  Port-A-Cath site without erythema.    Lab Results:  Lab Results  Component Value Date   WBC 8.5 08/05/2014   HGB 9.4* 08/05/2014   HCT 30.0* 08/05/2014   MCV 77.6* 08/05/2014   PLT 318 08/05/2014   NEUTROABS 7.2* 08/05/2014    Imaging:  No results found.  Medications: I have reviewed the patient's current medications.  Assessment/Plan: 1. 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  Initiation of radiation and Xeloda 07/20/2013 with completion 08/09/2013.  CT scans  abdomen/pelvis done 11/01/2013 for evaluation of increased abdominal pain showed stable infiltrating pancreatic mass. No progression identified. Stable peripancreatic lymph nodes. 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 No evidence of metastatic disease.  Pancreatic head mass stable on CT 01/03/2014 and 03/07/2014.  CA 19-9 736 on 05/24/2014.  Initiation of gemcitabine/Abraxane on a 2 week schedule 05/24/2014. CA 19-9 improved on 07/06/2014. CT scan 08/02/2014 (comparison scan 03/07/2014) showed a new hypervascular mass measuring 5 cm by 6 cm in the right hepatic lobe. 2. Obstructive jaundice secondary to #1. Status post placement of a bile duct stent on 05/24/2013. Status post placement of a metal stent 10/21/2013. 3. Pain -likely secondary to pancreas cancer, no improvement following the celiac plexus nerve block 02/10/2014. She continues Oxycodone and hydrocodone. 4. Diabetes. 5. History of chronic pancreatitis. 6. History of alcohol use. 7. History of tobacco use. 8. Anxiety/depression. 9. Cutaneous nodule at the left flank. Question cyst, question metastasis. 10. Abnormal EKG, question Brugada syndrome. 11. Hospitalization 08/12/2013 through 08/15/2013 with nausea/vomiting and abdominal pain. Improved. 12. 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 Portal vein patent. New wall thickening of the mid to distal stomach and the hepatic flexure and proximal transverse colon. 13. 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 She discontinued Lovenox due to abdominal wall ecchymoses and hematomas. 14. Admission 12/22/2013 with strep viridans bacteremia status post 4 week course of ceftriaxone.  15. Status post celiac plexus nerve block 02/10/2014.  16. 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 She started Pancrease after an office visit 02/07/2014. She continues to have intermittent diarrhea. She does not take the pancreatic enzyme replacement consistently. 17. Elevated liver enzymes. Improved 05/24/2014. 18. Admission 04/09/2014 with Escherichia coli and fungal bacteremia, completing outpatient ceftriaxone and micafungin. 19. Hypokalemia. She is on a potassium supplement. 20. Status post Port-A-Cath placement 06/14/2014. 21. Anorexia, depression. Remeron initiated 06/20/2014.   Disposition: [name] [name]'s clinical status appears unchanged. She has completed 4 treatments with gemcitabine/Abraxane on a 2 week schedule. The CA 19-9 tumor marker was better after 3 cycles. CT scan 08/02/2014 showed a "new" liver mass. However, the comparison scan was done 03/07/2014 which was approximately 3 months prior to initiating systemic therapy. Given the time interval between the comparison scan and initiation of chemotherapy, as well as the improvement in the CA 19-9 tumor marker and overall stable clinical status, Dr. Benay Spice recommends continuation of gemcitabine/Abraxane every 2 weeks for 8 weeks prior to the next restaging CT scan. We will follow the CA 19-9 tumor marker closely during this time.  She will return for a followup visit in 4 weeks. She will contact the office the interim with any problems.  Patient seen with Dr. Benay Spice. 40 minutes were spent face-to-face at today's visit with the majority of that time involved in counseling/coordination of care.    Ned Card ANP/GNP-BC   08/05/2014  11:49 AM   This was a shared visit with Ned Card. [name] [name] was interviewed and examined.  We discussed the recent CT findings, prognosis, and treatment options.  I recommend continuing gemcitabine/Abraxane based on the lower CA 19-9 and her clinical status. She understands it is possible the liver lesion represents disease progression.  We also discussed comfort/Hospice care.  She agrees to proceed with chemotherapy. We will schedule a restaging CT at a two-month interval. We will follow the CA 19-9 every 2 weeks.  Julieanne Manson, M.D.

## 2014-08-05 NOTE — Progress Notes (Signed)
Paged patient three times, to front lobby called name twice.  With third attempt patient coming off elevator and went to restroom.  Reports "having to have to wait for cream to work".  Noted Emla ctream to rt. Port-a-cath site.

## 2014-08-05 NOTE — Telephone Encounter (Signed)
gv adn printed appt sched and avs for pt for OCT....sed added tx. °

## 2014-08-05 NOTE — Progress Notes (Signed)
Discharged at 1437 with daughter via walker.  Drowsy, but no distress.

## 2014-08-08 ENCOUNTER — Other Ambulatory Visit: Payer: Self-pay

## 2014-08-19 ENCOUNTER — Telehealth: Payer: Self-pay | Admitting: Oncology

## 2014-08-19 ENCOUNTER — Ambulatory Visit: Payer: Medicaid Other

## 2014-08-19 ENCOUNTER — Other Ambulatory Visit: Payer: Medicaid Other

## 2014-08-19 NOTE — Telephone Encounter (Signed)
returned call to pt dtr re pt needing an appt. pt missed lb/tx today. dtr given new appt for 10/6 @ 10:45am. appt for lb/fu/tx remained as scheduled. message to [facility] re change and to advise on moving 10/16 appts if necessary.

## 2014-08-22 ENCOUNTER — Telehealth: Payer: Self-pay | Admitting: Oncology

## 2014-08-22 NOTE — Telephone Encounter (Signed)
per response from BS leave 10/16 appts as scheduled for now. see previous scheduling note.

## 2014-08-23 ENCOUNTER — Other Ambulatory Visit (HOSPITAL_BASED_OUTPATIENT_CLINIC_OR_DEPARTMENT_OTHER): Payer: Medicaid Other

## 2014-08-23 ENCOUNTER — Ambulatory Visit (HOSPITAL_BASED_OUTPATIENT_CLINIC_OR_DEPARTMENT_OTHER): Payer: Medicaid Other

## 2014-08-23 VITALS — BP 138/69 | HR 75 | Temp 98.1°F | Resp 18

## 2014-08-23 DIAGNOSIS — Z5111 Encounter for antineoplastic chemotherapy: Secondary | ICD-10-CM

## 2014-08-23 DIAGNOSIS — C250 Malignant neoplasm of head of pancreas: Secondary | ICD-10-CM

## 2014-08-23 DIAGNOSIS — C259 Malignant neoplasm of pancreas, unspecified: Secondary | ICD-10-CM

## 2014-08-23 LAB — CBC WITH DIFFERENTIAL/PLATELET
BASO%: 0.7 % (ref 0.0–2.0)
BASOS ABS: 0 10*3/uL (ref 0.0–0.1)
EOS ABS: 0.1 10*3/uL (ref 0.0–0.5)
EOS%: 1.4 % (ref 0.0–7.0)
HCT: 29 % — ABNORMAL LOW (ref 34.8–46.6)
HEMOGLOBIN: 8.8 g/dL — AB (ref 11.6–15.9)
LYMPH%: 4.7 % — ABNORMAL LOW (ref 14.0–49.7)
MCH: 23.5 pg — AB (ref 25.1–34.0)
MCHC: 30.5 g/dL — ABNORMAL LOW (ref 31.5–36.0)
MCV: 77.3 fL — ABNORMAL LOW (ref 79.5–101.0)
MONO#: 0.7 10*3/uL (ref 0.1–0.9)
MONO%: 10.4 % (ref 0.0–14.0)
NEUT%: 82.8 % — ABNORMAL HIGH (ref 38.4–76.8)
NEUTROS ABS: 5.9 10*3/uL (ref 1.5–6.5)
Platelets: 461 10*3/uL — ABNORMAL HIGH (ref 145–400)
RBC: 3.75 10*6/uL (ref 3.70–5.45)
RDW: 20.6 % — ABNORMAL HIGH (ref 11.2–14.5)
WBC: 7.2 10*3/uL (ref 3.9–10.3)
lymph#: 0.3 10*3/uL — ABNORMAL LOW (ref 0.9–3.3)

## 2014-08-23 LAB — COMPREHENSIVE METABOLIC PANEL (CC13)
ALK PHOS: 645 U/L — AB (ref 40–150)
ALT: 33 U/L (ref 0–55)
AST: 49 U/L — ABNORMAL HIGH (ref 5–34)
Albumin: 2.9 g/dL — ABNORMAL LOW (ref 3.5–5.0)
Anion Gap: 6 mEq/L (ref 3–11)
BILIRUBIN TOTAL: 0.72 mg/dL (ref 0.20–1.20)
BUN: 8.8 mg/dL (ref 7.0–26.0)
CO2: 26 mEq/L (ref 22–29)
Calcium: 9.2 mg/dL (ref 8.4–10.4)
Chloride: 100 mEq/L (ref 98–109)
Creatinine: 0.7 mg/dL (ref 0.6–1.1)
GLUCOSE: 168 mg/dL — AB (ref 70–140)
Potassium: 3.3 mEq/L — ABNORMAL LOW (ref 3.5–5.1)
Sodium: 133 mEq/L — ABNORMAL LOW (ref 136–145)
Total Protein: 7.3 g/dL (ref 6.4–8.3)

## 2014-08-23 MED ORDER — ONDANSETRON 8 MG/NS 50 ML IVPB
INTRAVENOUS | Status: AC
  Filled 2014-08-23: qty 8

## 2014-08-23 MED ORDER — PACLITAXEL PROTEIN-BOUND CHEMO INJECTION 100 MG
100.0000 mg/m2 | Freq: Once | INTRAVENOUS | Status: AC
  Administered 2014-08-23: 175 mg via INTRAVENOUS
  Filled 2014-08-23: qty 35

## 2014-08-23 MED ORDER — SODIUM CHLORIDE 0.9 % IV SOLN
700.0000 mg/m2 | Freq: Once | INTRAVENOUS | Status: AC
  Administered 2014-08-23: 1178 mg via INTRAVENOUS
  Filled 2014-08-23: qty 30.98

## 2014-08-23 MED ORDER — DEXAMETHASONE SODIUM PHOSPHATE 10 MG/ML IJ SOLN
INTRAMUSCULAR | Status: AC
  Filled 2014-08-23: qty 1

## 2014-08-23 MED ORDER — ONDANSETRON 8 MG/50ML IVPB (CHCC)
8.0000 mg | Freq: Once | INTRAVENOUS | Status: AC
  Administered 2014-08-23: 8 mg via INTRAVENOUS

## 2014-08-23 MED ORDER — HEPARIN SOD (PORK) LOCK FLUSH 100 UNIT/ML IV SOLN
500.0000 [IU] | Freq: Once | INTRAVENOUS | Status: AC | PRN
  Administered 2014-08-23: 500 [IU]
  Filled 2014-08-23: qty 5

## 2014-08-23 MED ORDER — SODIUM CHLORIDE 0.9 % IV SOLN
Freq: Once | INTRAVENOUS | Status: AC
  Administered 2014-08-23: 12:00:00 via INTRAVENOUS

## 2014-08-23 MED ORDER — SODIUM CHLORIDE 0.9 % IJ SOLN
10.0000 mL | INTRAMUSCULAR | Status: DC | PRN
  Administered 2014-08-23: 10 mL
  Filled 2014-08-23: qty 10

## 2014-08-23 MED ORDER — DEXAMETHASONE SODIUM PHOSPHATE 10 MG/ML IJ SOLN
10.0000 mg | Freq: Once | INTRAMUSCULAR | Status: AC
  Administered 2014-08-23: 10 mg via INTRAVENOUS

## 2014-08-23 NOTE — Progress Notes (Signed)
Cmet reviewed by Ned Card, NP; OK to treat.  Patient verbalizes she is taking oral K supplement as prescribed. RN encouraged adherence to meds as ordered and to increase K rich foods, such as banana. Patient and family member verbalize understanding.

## 2014-08-23 NOTE — Patient Instructions (Signed)
[facility] Discharge Instructions for Patients Receiving Chemotherapy  Today you received the following chemotherapy agents: Abraxane and Gemzar   To help prevent nausea and vomiting after your treatment, we encourage you to take your nausea medication as prescribed.    If you develop nausea and vomiting that is not controlled by your nausea medication, call the clinic.   BELOW ARE SYMPTOMS THAT SHOULD BE REPORTED IMMEDIATELY:  *FEVER GREATER THAN 100.5 F  *CHILLS WITH OR WITHOUT FEVER  NAUSEA AND VOMITING THAT IS NOT CONTROLLED WITH YOUR NAUSEA MEDICATION  *UNUSUAL SHORTNESS OF BREATH  *UNUSUAL BRUISING OR BLEEDING  TENDERNESS IN MOUTH AND THROAT WITH OR WITHOUT PRESENCE OF ULCERS  *URINARY PROBLEMS  *BOWEL PROBLEMS  UNUSUAL RASH Items with * indicate a potential emergency and should be followed up as soon as possible.  Feel free to call the clinic you have any questions or concerns. The clinic phone number is [phone].    

## 2014-08-24 ENCOUNTER — Encounter (HOSPITAL_COMMUNITY): Payer: Self-pay | Admitting: Emergency Medicine

## 2014-08-24 ENCOUNTER — Emergency Department (HOSPITAL_COMMUNITY): Payer: Medicaid Other

## 2014-08-24 ENCOUNTER — Emergency Department (HOSPITAL_COMMUNITY)
Admission: EM | Admit: 2014-08-24 | Discharge: 2014-08-24 | Disposition: A | Discharge status: Home / Self Care | Payer: Medicaid Other | Attending: Emergency Medicine | Admitting: Emergency Medicine

## 2014-08-24 DIAGNOSIS — C259 Malignant neoplasm of pancreas, unspecified: Secondary | ICD-10-CM | POA: Diagnosis not present

## 2014-08-24 DIAGNOSIS — Z8673 Personal history of transient ischemic attack (TIA), and cerebral infarction without residual deficits: Secondary | ICD-10-CM | POA: Insufficient documentation

## 2014-08-24 DIAGNOSIS — Z9889 Other specified postprocedural states: Secondary | ICD-10-CM | POA: Diagnosis not present

## 2014-08-24 DIAGNOSIS — M5136 Other intervertebral disc degeneration, lumbar region: Secondary | ICD-10-CM | POA: Diagnosis not present

## 2014-08-24 DIAGNOSIS — Z9851 Tubal ligation status: Secondary | ICD-10-CM | POA: Insufficient documentation

## 2014-08-24 DIAGNOSIS — Z7982 Long term (current) use of aspirin: Secondary | ICD-10-CM | POA: Diagnosis not present

## 2014-08-24 DIAGNOSIS — E785 Hyperlipidemia, unspecified: Secondary | ICD-10-CM | POA: Diagnosis not present

## 2014-08-24 DIAGNOSIS — H9190 Unspecified hearing loss, unspecified ear: Secondary | ICD-10-CM | POA: Diagnosis not present

## 2014-08-24 DIAGNOSIS — Z87891 Personal history of nicotine dependence: Secondary | ICD-10-CM | POA: Diagnosis not present

## 2014-08-24 DIAGNOSIS — K567 Ileus, unspecified: Secondary | ICD-10-CM | POA: Diagnosis not present

## 2014-08-24 DIAGNOSIS — Z923 Personal history of irradiation: Secondary | ICD-10-CM | POA: Diagnosis not present

## 2014-08-24 DIAGNOSIS — I10 Essential (primary) hypertension: Secondary | ICD-10-CM | POA: Diagnosis not present

## 2014-08-24 DIAGNOSIS — E119 Type 2 diabetes mellitus without complications: Secondary | ICD-10-CM | POA: Diagnosis not present

## 2014-08-24 DIAGNOSIS — F329 Major depressive disorder, single episode, unspecified: Secondary | ICD-10-CM | POA: Insufficient documentation

## 2014-08-24 DIAGNOSIS — Z79899 Other long term (current) drug therapy: Secondary | ICD-10-CM | POA: Diagnosis not present

## 2014-08-24 DIAGNOSIS — R1012 Left upper quadrant pain: Secondary | ICD-10-CM

## 2014-08-24 DIAGNOSIS — Z862 Personal history of diseases of the blood and blood-forming organs and certain disorders involving the immune mechanism: Secondary | ICD-10-CM | POA: Insufficient documentation

## 2014-08-24 DIAGNOSIS — G8929 Other chronic pain: Secondary | ICD-10-CM | POA: Diagnosis not present

## 2014-08-24 DIAGNOSIS — K219 Gastro-esophageal reflux disease without esophagitis: Secondary | ICD-10-CM | POA: Insufficient documentation

## 2014-08-24 DIAGNOSIS — F419 Anxiety disorder, unspecified: Secondary | ICD-10-CM | POA: Insufficient documentation

## 2014-08-24 DIAGNOSIS — Z794 Long term (current) use of insulin: Secondary | ICD-10-CM | POA: Insufficient documentation

## 2014-08-24 LAB — CBC WITH DIFFERENTIAL/PLATELET
BASOS PCT: 0 % (ref 0–1)
Basophils Absolute: 0 10*3/uL (ref 0.0–0.1)
EOS ABS: 0 10*3/uL (ref 0.0–0.7)
Eosinophils Relative: 0 % (ref 0–5)
HCT: 25.8 % — ABNORMAL LOW (ref 36.0–46.0)
Hemoglobin: 8.2 g/dL — ABNORMAL LOW (ref 12.0–15.0)
Lymphocytes Relative: 3 % — ABNORMAL LOW (ref 12–46)
Lymphs Abs: 0.3 10*3/uL — ABNORMAL LOW (ref 0.7–4.0)
MCH: 24 pg — AB (ref 26.0–34.0)
MCHC: 31.8 g/dL (ref 30.0–36.0)
MCV: 75.7 fL — ABNORMAL LOW (ref 78.0–100.0)
MONOS PCT: 3 % (ref 3–12)
Monocytes Absolute: 0.3 10*3/uL (ref 0.1–1.0)
NEUTROS PCT: 94 % — AB (ref 43–77)
Neutro Abs: 7.8 10*3/uL — ABNORMAL HIGH (ref 1.7–7.7)
Platelets: 317 10*3/uL (ref 150–400)
RBC: 3.41 MIL/uL — ABNORMAL LOW (ref 3.87–5.11)
RDW: 19.3 % — ABNORMAL HIGH (ref 11.5–15.5)
WBC: 8.4 10*3/uL (ref 4.0–10.5)

## 2014-08-24 LAB — URINE MICROSCOPIC-ADD ON

## 2014-08-24 LAB — COMPREHENSIVE METABOLIC PANEL
ALBUMIN: 2.7 g/dL — AB (ref 3.5–5.2)
ALT: 33 U/L (ref 0–35)
ANION GAP: 10 (ref 5–15)
AST: 65 U/L — ABNORMAL HIGH (ref 0–37)
Alkaline Phosphatase: 550 U/L — ABNORMAL HIGH (ref 39–117)
BUN: 11 mg/dL (ref 6–23)
CALCIUM: 8.5 mg/dL (ref 8.4–10.5)
CO2: 23 mEq/L (ref 19–32)
CREATININE: 0.51 mg/dL (ref 0.50–1.10)
Chloride: 100 mEq/L (ref 96–112)
GFR calc Af Amer: >90 mL/min (ref >90)
GFR calc non Af Amer: >90 mL/min (ref >90)
Glucose, Bld: 144 mg/dL — ABNORMAL HIGH (ref 70–99)
Potassium: 3.4 mEq/L — ABNORMAL LOW (ref 3.7–5.3)
Sodium: 133 mEq/L — ABNORMAL LOW (ref 137–147)
Total Bilirubin: 0.8 mg/dL (ref 0.3–1.2)
Total Protein: 6.8 g/dL (ref 6.0–8.3)

## 2014-08-24 LAB — URINALYSIS, ROUTINE W REFLEX MICROSCOPIC
Bilirubin Urine: NEGATIVE
Glucose, UA: 100 mg/dL — AB
Ketones, ur: NEGATIVE mg/dL
Nitrite: NEGATIVE
Protein, ur: NEGATIVE mg/dL
SPECIFIC GRAVITY, URINE: 1.018 (ref 1.005–1.030)
UROBILINOGEN UA: 1 mg/dL (ref 0.0–1.0)
pH: 6 (ref 5.0–8.0)

## 2014-08-24 LAB — CANCER ANTIGEN 19-9: CA 19 9: 549.3 U/mL — AB (ref <35.0)

## 2014-08-24 LAB — LIPASE, BLOOD: LIPASE: 5 U/L — AB (ref 11–59)

## 2014-08-24 MED ORDER — ONDANSETRON HCL 4 MG/2ML IJ SOLN
4.0000 mg | Freq: Once | INTRAMUSCULAR | Status: AC
  Administered 2014-08-24: 4 mg via INTRAVENOUS
  Filled 2014-08-24: qty 2

## 2014-08-24 MED ORDER — SODIUM CHLORIDE 0.9 % IV BOLUS (SEPSIS)
1000.0000 mL | INTRAVENOUS | Status: AC
  Administered 2014-08-24: 1000 mL via INTRAVENOUS

## 2014-08-24 MED ORDER — OXYCODONE HCL 10 MG PO TABS: 10.0000 mg | ORAL_TABLET | Freq: Three times a day (TID) | ORAL | Status: DC | PRN

## 2014-08-24 MED ORDER — HYDROMORPHONE HCL 1 MG/ML IJ SOLN
1.0000 mg | INTRAMUSCULAR | Status: AC
  Administered 2014-08-24: 1 mg via INTRAVENOUS
  Filled 2014-08-24: qty 1

## 2014-08-24 MED ORDER — HYDROMORPHONE HCL 1 MG/ML IJ SOLN: 0.5000 mg | Freq: Once | INTRAMUSCULAR | Status: AC

## 2014-08-24 MED ORDER — IOHEXOL 300 MG/ML  SOLN
100.0000 mL | Freq: Once | INTRAMUSCULAR | Status: AC | PRN
  Administered 2014-08-24: 100 mL via INTRAVENOUS

## 2014-08-24 MED ORDER — HYDROMORPHONE HCL 1 MG/ML IJ SOLN
1.0000 mg | Freq: Once | INTRAMUSCULAR | Status: AC
  Administered 2014-08-24: 1 mg via INTRAVENOUS
  Filled 2014-08-24: qty 1

## 2014-08-24 MED ORDER — HEPARIN SOD (PORK) LOCK FLUSH 100 UNIT/ML IV SOLN
500.0000 [IU] | Freq: Once | INTRAVENOUS | Status: DC
  Filled 2014-08-24: qty 5

## 2014-08-24 MED ORDER — IOHEXOL 300 MG/ML  SOLN: 50.0000 mL | Freq: Once | INTRAMUSCULAR | Status: DC | PRN

## 2014-08-24 NOTE — ED Provider Notes (Signed)
CSN: [id number]     Arrival date & time 08/24/14  1626 History   First MD Initiated Contact with Patient 08/24/14 1639     Chief Complaint  Patient presents with  . Abdominal Pain     (Consider location/radiation/quality/duration/timing/severity/associated sxs/prior Treatment) Patient is a 59 y.o. female presenting with abdominal pain. The history is provided by the patient.  Abdominal Pain Pain location:  Epigastric and LUQ Pain quality: aching and shooting   Pain radiates to:  Does not radiate Pain severity:  Moderate Onset quality:  Gradual Timing:  Intermittent Progression:  Worsening Chronicity:  Chronic Context comment:  Pancreatic cancer Relieved by:  Nothing Worsened by:  Nothing tried Ineffective treatments: narcotic po meds. Associated symptoms: nausea and vomiting   Associated symptoms: no chest pain, no cough, no diarrhea, no dysuria, no fatigue, no fever, no hematuria and no shortness of breath     Past Medical History  Diagnosis Date  . DM (diabetes mellitus), type 2     requires insulin  . Hypertension   . Depression   . Anxiety   . Hyperlipidemia   . Pancreatitis   . GERD (gastroesophageal reflux disease)   . Pancreatic cancer 05/2013    adenocarcinoma on ERCP/FNA  . Hearing impaired     lost the last of her hearing aids and cn not get another one.  hearing is better via right ear.   Marland Kitchen History of radiation therapy 07/20/13-08/09/13    Pancreas 37.5Gy  . DDD (degenerative disc disease), lumbar   . Chronic back pain   . Stroke   . Anemia   . Ulcer 01/2014    radiation gastroduodenitis with duodenal ulcer.    Past Surgical History  Procedure Laterality Date  . Appendectomy    . Ercp N/A 05/24/2013    Procedure: ENDOSCOPIC RETROGRADE CHOLANGIOPANCREATOGRAPHY (ERCP);  Surgeon: Milus Banister, MD;  Location: [place];  Service: Endoscopy;  Laterality: N/A;  MAC vs. general per anesthesia   . Tubal ligation    . Endoscopic retrograde cholangiopancreatography  (ercp) with propofol N/A 10/21/2013    Procedure: ENDOSCOPIC RETROGRADE CHOLANGIOPANCREATOGRAPHY (ERCP) WITH PROPOFOL;  Surgeon: Milus Banister, MD;  Location: [facility];  Service: Endoscopy;  Laterality: N/A;  Stent removal   . Biliary stent placement N/A 10/21/2013    Procedure: BILIARY STENT PLACEMENT;  Surgeon: Milus Banister, MD;  Location: [facility];  Service: Endoscopy;  Laterality: N/A;  . Tee without cardioversion N/A 12/29/2013    Procedure: TRANSESOPHAGEAL ECHOCARDIOGRAM (TEE);  Surgeon: Dorothy Spark, MD;  Location: [place];  Service: Cardiovascular;  Laterality: N/A;  . Eus N/A 02/10/2014    Procedure: UPPER ENDOSCOPIC ULTRASOUND (EUS) LINEAR;  Surgeon: Milus Banister, MD;  Location: [facility];  Service: Endoscopy;  Laterality: N/A;  celiac plexus neurolysis  . Bile duct stent placement     Family History  Problem Relation Age of Onset  . Diabetes type II Mother   . Hypertension Mother   . Heart attack Father   . HIV/AIDS Brother    History  Substance Use Topics  . Smoking status: Former Smoker -- 0.50 packs/day for 42 years    Quit date: 05/28/2013  . Smokeless tobacco: Never Used  . Alcohol Use: No   OB History   Grav Para Term Preterm Abortions TAB SAB Ect Mult Living                 Review of Systems  Constitutional: Negative for fever and fatigue.  HENT:  Negative for congestion and drooling.   Eyes: Negative for pain.  Respiratory: Negative for cough and shortness of breath.   Cardiovascular: Negative for chest pain.  Gastrointestinal: Positive for nausea, vomiting and abdominal pain. Negative for diarrhea.  Genitourinary: Negative for dysuria and hematuria.  Musculoskeletal: Negative for back pain, gait problem and neck pain.  Skin: Negative for color change.  Neurological: Negative for dizziness and headaches.  Hematological: Negative for adenopathy.  Psychiatric/Behavioral: Negative for behavioral problems.  All other systems reviewed  and are negative.     Allergies  Betadine  Home Medications   Prior to Admission medications   Medication Sig Start Date End Date Taking? Authorizing Provider  amLODipine (NORVASC) 10 MG tablet Take 10 mg by mouth every morning.   Yes Historical Provider, MD  aspirin EC 81 MG tablet Take 81 mg by mouth daily.   Yes Historical Provider, MD  cetirizine (ZYRTEC) 10 MG tablet Take 10 mg by mouth daily as needed for allergies.    Yes Historical Provider, MD  docusate sodium (COLACE) 100 MG capsule Take 1 capsule (100 mg total) by mouth 2 (two) times daily as needed for mild constipation. 03/25/14  Yes Kalman Drape, MD  gabapentin (NEURONTIN) 300 MG capsule Take 300 mg by mouth 3 (three) times daily.   Yes Historical Provider, MD  HYDROcodone-acetaminophen (NORCO/VICODIN) 5-325 MG per tablet Take 1-2 tablets by mouth every 4 (four) hours as needed for moderate pain (take during daytime). 08/05/14  Yes Owens Shark, NP  insulin aspart (NOVOLOG) 100 UNIT/ML injection Inject 10 Units into the skin 3 (three) times daily with meals.   Yes Historical Provider, MD  insulin glargine (LANTUS) 100 UNIT/ML injection Inject 30 Units into the skin at bedtime.    Yes Historical Provider, MD  lidocaine-prilocaine (EMLA) cream Apply 1 application topically as needed. 06/20/14  Yes Owens Shark, NP  lipase/protease/amylase (CREON-12/PANCREASE) 12000 UNITS CPEP capsule Take 1 capsule by mouth 3 (three) times daily before meals. 02/16/14  Yes Owens Shark, NP  lisinopril (PRINIVIL,ZESTRIL) 40 MG tablet Take 40 mg by mouth every morning.    Yes Historical Provider, MD  metoCLOPramide (REGLAN) 5 MG tablet Take 5 mg by mouth 3 (three) times daily before meals.    Yes Historical Provider, MD  metoprolol tartrate (LOPRESSOR) 25 MG tablet Take 25 mg by mouth 2 (two) times daily.   Yes Historical Provider, MD  mirtazapine (REMERON) 15 MG tablet Take 1 tablet (15 mg total) by mouth at bedtime. 06/20/14  Yes Owens Shark, NP   ondansetron (ZOFRAN ODT) 8 MG disintegrating tablet Take 1 tablet (8 mg total) by mouth every 8 (eight) hours as needed for nausea or vomiting. 03/25/14  Yes Kalman Drape, MD  Oxycodone HCl 10 MG TABS Take 1 tablet (10 mg total) by mouth every 4 (four) hours as needed (severe pain). 08/05/14  Yes Owens Shark, NP  pantoprazole (PROTONIX) 40 MG tablet Take 40 mg by mouth daily.   Yes Historical Provider, MD  polyethylene glycol (MIRALAX / GLYCOLAX) packet Take 17 g by mouth daily as needed for mild constipation or moderate constipation. 03/25/14  Yes Kalman Drape, MD  potassium chloride (K-DUR,KLOR-CON) 10 MEQ tablet Take 10 mEq by mouth daily.   Yes Historical Provider, MD  simethicone (MYLICON) 270 MG chewable tablet Chew 125 mg by mouth every 6 (six) hours as needed for flatulence.   Yes Historical Provider, MD  simvastatin (ZOCOR) 10 MG tablet Take 10  mg by mouth at bedtime.   Yes Historical Provider, MD  sucralfate (CARAFATE) 1 GM/10ML suspension Take 1 g by mouth 2 (two) times daily.   Yes Historical Provider, MD   BP 137/86  Pulse 104  Temp(Src) 99.3 F (37.4 C) (Oral)  Resp 20  SpO2 98% Physical Exam  Nursing note and vitals reviewed. Constitutional: She is oriented to person, place, and time. She appears well-developed and well-nourished.  HENT:  Head: Normocephalic.  Mouth/Throat: Oropharynx is clear and moist. No oropharyngeal exudate.  Eyes: Conjunctivae and EOM are normal. Pupils are equal, round, and reactive to light.  Neck: Normal range of motion. Neck supple.  Cardiovascular: Normal rate, regular rhythm, normal heart sounds and intact distal pulses.  Exam reveals no gallop and no friction rub.   No murmur heard. Pulmonary/Chest: Effort normal and breath sounds normal. No respiratory distress. She has no wheezes.  Abdominal: Soft. Bowel sounds are normal. There is tenderness. There is no rebound and no guarding.  Mild epigastric and left upper quadrant tenderness to palpation  on exam.  Genitourinary:  Mild external hemorrhoids which are not inflamed. Otherwise normal rectal exam without pain. Brown stool. No stool felt in the rectal vault.  Musculoskeletal: Normal range of motion. She exhibits no edema and no tenderness.  No focal tenderness of the back.  Neurological: She is alert and oriented to person, place, and time.  Skin: Skin is warm and dry.  Psychiatric: She has a normal mood and affect. Her behavior is normal.    ED Course  Procedures (including critical care time) Labs Review Labs Reviewed  CBC WITH DIFFERENTIAL - Abnormal; Notable for the following:    RBC 3.41 (*)    Hemoglobin 8.2 (*)    HCT 25.8 (*)    MCV 75.7 (*)    MCH 24.0 (*)    RDW 19.3 (*)    Neutrophils Relative % 94 (*)    Neutro Abs 7.8 (*)    Lymphocytes Relative 3 (*)    Lymphs Abs 0.3 (*)    All other components within normal limits  COMPREHENSIVE METABOLIC PANEL - Abnormal; Notable for the following:    Sodium 133 (*)    Potassium 3.4 (*)    Glucose, Bld 144 (*)    Albumin 2.7 (*)    AST 65 (*)    Alkaline Phosphatase 550 (*)    All other components within normal limits  LIPASE, BLOOD - Abnormal; Notable for the following:    Lipase 5 (*)    All other components within normal limits  URINALYSIS, ROUTINE W REFLEX MICROSCOPIC - Abnormal; Notable for the following:    Color, Urine AMBER (*)    APPearance CLOUDY (*)    Glucose, UA 100 (*)    Hgb urine dipstick LARGE (*)    Leukocytes, UA TRACE (*)    All other components within normal limits  URINE MICROSCOPIC-ADD ON - Abnormal; Notable for the following:    Squamous Epithelial / LPF FEW (*)    All other components within normal limits    Imaging Review Ct Abdomen Pelvis W Contrast  08/24/2014   CLINICAL DATA:  Initial encounter for progressive pain. Pancreatic cancer. Ongoing chemotherapy.  EXAM: CT ABDOMEN AND PELVIS WITH CONTRAST  TECHNIQUE: Multidetector CT imaging of the abdomen and pelvis was performed  using the standard protocol following bolus administration of intravenous contrast.  CONTRAST:  154mL OMNIPAQUE IOHEXOL 300 MG/ML  SOLN  COMPARISON:  CT abdomen and pelvis 08/02/2014.  FINDINGS: Mild dependent atelectasis is evident at the left base. The heart size is normal. Coronary artery calcifications are again seen.  There is geographic opacification scratch the there is geographic enhancement of the liver compatible of perfusion defects. 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 The biliary drain is in place. Pneumobilia is again seen. The spleen is within normal limits. A splenule is evident.  The stomach and duodenum are stable. The common bile duct and gallbladder are normal. Pancreatic ductal dilation is stable.  The adrenal glands are normal bilaterally. The left renal cyst is stable. Kidneys and ureters are otherwise within normal limits. Rectosigmoid colon is within normal limits. The remainder of the colon is within normal limits.  Contrast is present within central bowel loops. There is a transition point in the mid abdomen on image 36 of series 2 the distal small bowel is collapsed. Gas and stool present throughout colon. Moderate generalized ascites is similar to the prior exam.  Atherosclerotic calcifications are present in the aorta and branch vessels. Uterus and adnexa are stable. A calcified fibroid is again seen. Prominent gonadal veins are present bilaterally.  Bone windows dense per 8 endplate changes and Schmorl's nodes at L2-3. There is chronic loss of disc height in the lower thoracic spine. No focal lytic or blastic lesions are evident.  IMPRESSION: 1. Contrast mostly within central bowel loops with partial collapse of the distal small bowel but gas and stool throughout the colon. This may represent a functional ileus. 2. Stable appearance of moderate ascites. 3. 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 4. Biliary stent is stable. 5. Stable appearance of the pancreas.   Electronically Signed   By: Lawrence Santiago M.D.   On: 08/24/2014 20:11     EKG Interpretation None      MDM   Final diagnoses:  Left upper quadrant pain  Ileus    5:26 PM 58 y.o. female with a history of pancreatic cancer currently on chemotherapy who presents with ongoing and worsening abdominal pain. She states it's in the left upper quadrant and also in her lower back bilaterally. She has difficulty describing the course of her pain but states that sometimes her pain medicines just don't work. It does not sound like this is a new pain and that it is likely a pain control issue for her known pancreatic cancer. She states that she has had some nausea and vomiting. She denies any fevers. Her vital signs are unremarkable here. She was seen here approximately one month ago with a noncontributory CT scan and there was some note that she was to establish with hospice but she denied that at that time. She also denies being with hospice today and states that she is undergoing chemotherapy and if that does not work she is going to establish with hospice. Will get pain control, screening labs, and imaging.  8:51 PM: I interpreted/reviewed the labs and/or imaging which were non-contributory.  Pt's pain improved. Sx likely related to her chronic pain and pancreatic cancer. As her pain is under better control and her labwork and imaging is noncontributory will recommend she followup with her doctor as needed. I have discussed the diagnosis/risks/treatment options with the patient and believe the pt to be eligible for discharge home to follow-up with her pcp. We also discussed returning to the ED immediately if new or worsening sx occur. We discussed  the sx which are most concerning (e.g., worsening pain, fever) that necessitate immediate return. Medications  administered to the patient during their visit and any new prescriptions provided to the patient are listed below.  Medications given during this visit Medications  HYDROmorphone (DILAUDID) injection 0.5 mg (not administered)  iohexol (OMNIPAQUE) 300 MG/ML solution 50 mL (not administered)  HYDROmorphone (DILAUDID) injection 1 mg (1 mg Intravenous Given 08/24/14 1838)  sodium chloride 0.9 % bolus 1,000 mL (1,000 mLs Intravenous New Bag/Given 08/24/14 1838)  ondansetron (ZOFRAN) injection 4 mg (4 mg Intravenous Given 08/24/14 1838)  iohexol (OMNIPAQUE) 300 MG/ML solution 100 mL (100 mLs Intravenous Contrast Given 08/24/14 1931)  HYDROmorphone (DILAUDID) injection 1 mg (1 mg Intravenous Given 08/24/14 2036)    New Prescriptions   No medications on file     Pamella Pert, MD 08/24/14 2052

## 2014-08-24 NOTE — ED Notes (Signed)
She c/o abd. Pain.  She is a pancreatic cancer pt. Who had chemo. Yesterday.  She is in no distress.

## 2014-08-24 NOTE — ED Notes (Signed)
Went to collect labs - pt has port.

## 2014-08-31 ENCOUNTER — Telehealth: Payer: Self-pay | Admitting: Oncology

## 2014-08-31 ENCOUNTER — Telehealth: Payer: Self-pay | Admitting: *Deleted

## 2014-08-31 DIAGNOSIS — C259 Malignant neoplasm of pancreas, unspecified: Secondary | ICD-10-CM

## 2014-08-31 DIAGNOSIS — C250 Malignant neoplasm of head of pancreas: Secondary | ICD-10-CM

## 2014-08-31 NOTE — Addendum Note (Signed)
Addended by: Domenic Schwab on: 08/31/2014 03:26 PM   Modules accepted: Orders

## 2014-08-31 NOTE — Telephone Encounter (Signed)
Per Ned Card, NP; notified [place] that Ned Card, NP instructed pt to come to lab for UA and culture; and Okay for pt to keep lab/MD appt for 10/16 and treatment will be changed to 09/06/14 and new calendar will be given to her when she is here for UA.  [facility] verbalized understanding and stated pt could come tomorrow for labs.  [facility] notified that schedulers will contact her for lab appt tomorrow.

## 2014-08-31 NOTE — Telephone Encounter (Signed)
Spk w/daughter [name] confirming labs/ov per 10/14 POF per Amy

## 2014-08-31 NOTE — Telephone Encounter (Signed)
Received message from pt to call re: appts.  Called and spoke with [name]  States that pt got treated 10/6 and she knows it should be every 2 weeks and that would make next treatment on 10/20; does she need to keep appt for 10/16?  Also, pt is having "a lot of blood in her urine"  For 2 days now.  Reports that she is drinking well but appetite is low, denies fever/N/V.  Note to Ned Card, NP.

## 2014-09-01 ENCOUNTER — Telehealth: Payer: Self-pay | Admitting: *Deleted

## 2014-09-01 NOTE — Telephone Encounter (Signed)
Per staff message and POF I have scheduled appts. Advised scheduler of appts. JMW  

## 2014-09-02 ENCOUNTER — Ambulatory Visit (HOSPITAL_BASED_OUTPATIENT_CLINIC_OR_DEPARTMENT_OTHER): Payer: Medicaid Other | Admitting: Oncology

## 2014-09-02 ENCOUNTER — Other Ambulatory Visit: Payer: Self-pay

## 2014-09-02 ENCOUNTER — Telehealth: Payer: Self-pay | Admitting: Oncology

## 2014-09-02 ENCOUNTER — Telehealth: Payer: Self-pay | Admitting: *Deleted

## 2014-09-02 ENCOUNTER — Ambulatory Visit: Payer: Medicaid Other

## 2014-09-02 ENCOUNTER — Other Ambulatory Visit: Payer: Medicaid Other

## 2014-09-02 VITALS — BP 121/63 | HR 92 | Temp 98.6°F | Resp 18 | Ht 66.0 in | Wt 118.9 lb

## 2014-09-02 DIAGNOSIS — F418 Other specified anxiety disorders: Secondary | ICD-10-CM

## 2014-09-02 DIAGNOSIS — R630 Anorexia: Secondary | ICD-10-CM

## 2014-09-02 DIAGNOSIS — E119 Type 2 diabetes mellitus without complications: Secondary | ICD-10-CM

## 2014-09-02 DIAGNOSIS — C250 Malignant neoplasm of head of pancreas: Secondary | ICD-10-CM

## 2014-09-02 DIAGNOSIS — C259 Malignant neoplasm of pancreas, unspecified: Secondary | ICD-10-CM

## 2014-09-02 DIAGNOSIS — Z86718 Personal history of other venous thrombosis and embolism: Secondary | ICD-10-CM

## 2014-09-02 MED ORDER — OXYCODONE HCL 10 MG PO TABS: 10.0000 mg | ORAL_TABLET | ORAL | Status: DC | PRN

## 2014-09-02 NOTE — Telephone Encounter (Signed)
Called office of PCP and left message with staff member to alert [name] [name] to fact that [name] [name] has been referred to Hospice now and for him to check her medication list to consolidate some of the multiple meds she is taking. [place] referral to intake # with chart information. Lab reports that [name] [name] was not able to void for her U/A & culture today, saying "I always have trouble peeing at the doctor's office". They provided her a cup to take home and have family bring in when she is able to collect sample.

## 2014-09-02 NOTE — Telephone Encounter (Signed)
Gave avs and appt 11/3. Check in for Labs.

## 2014-09-02 NOTE — Progress Notes (Signed)
Patient was hostile to nurse and refused to complete the collaborative assessment. Refused to review her medication list with nurse-daughter reviewed the best she could. Says she needs refills on her hydrocodone and oxycodone pain meds.

## 2014-09-02 NOTE — Progress Notes (Signed)
[place] OFFICE PROGRESS NOTE   Diagnosis: Pancreas cancer  INTERVAL HISTORY:   [name] [name] returns as scheduled. She completed treatment with gemcitabine/Abraxane 08/05/2014 and 08/23/2014. She has multiple complaints including intermittent nausea, hematuria, abdominal pain, and back pain. Her appetite is poor.  Objective:  Vital signs in last 24 hours:  Blood pressure 121/63, pulse 92, temperature 98.6 F (37 C), temperature source Oral, resp. rate 18, height 5\' 6"  (1.676 m), weight 118 lb 14.4 oz (53.933 kg), SpO2 99.00%.    HEENT: No thrush or ulcers Resp: Lungs clear bilaterally Cardio: Regular rate and rhythm GI: Mildly distended, fullness in the right upper abdomen, no discrete mass. Mild diffuse tenderness. Vascular: No leg edema Musculoskeletal: Mild tenderness at the left flank   Portacath/PICC-without erythema  Lab Results:  Lab Results  Component Value Date   WBC 8.4 08/24/2014   HGB 8.2* 08/24/2014   HCT 25.8* 08/24/2014   MCV 75.7* 08/24/2014   PLT 317 08/24/2014   NEUTROABS 7.8* 08/24/2014    Medications: I have reviewed the patient's current medications.  Assessment/Plan: 1. 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  Initiation of radiation and Xeloda 07/20/2013 with completion 08/09/2013.  CT scans abdomen/pelvis done 11/01/2013 for evaluation of increased abdominal pain showed stable infiltrating pancreatic mass. No progression identified. Stable peripancreatic lymph nodes. 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 No evidence of metastatic disease.  Pancreatic head mass stable on CT 01/03/2014 and 03/07/2014.  CA 19-9 736 on 05/24/2014.  Initiation of gemcitabine/Abraxane on a 2 week schedule 05/24/2014.  CA 19-9 improved on 07/06/2014.  CT scan 08/02/2014 (comparison scan 03/07/2014)  showed a new hypervascular mass measuring 5 cm by 6 cm in the right hepatic lobe. 2. Obstructive jaundice secondary to #1. Status post placement of a bile duct stent on 05/24/2013. Status post placement of a metal stent 10/21/2013. 3. Pain -likely secondary to pancreas cancer, no improvement following the celiac plexus nerve block 02/10/2014. She continues Oxycodone and hydrocodone. 4. Diabetes. 5. History of chronic pancreatitis. 6. History of alcohol use. 7. History of tobacco use. 8. Anxiety/depression. 9. History of a Cutaneous nodule at the left flank. Question cyst, question metastasis. 10. Abnormal EKG, question Brugada syndrome. 11. Hospitalization 08/12/2013 through 08/15/2013 with nausea/vomiting and abdominal pain. Improved. 12. 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 Portal vein patent. New wall thickening of the mid to distal stomach and the hepatic flexure and proximal transverse colon. 13. 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 She discontinued Lovenox due to abdominal wall ecchymoses and hematomas. 14. Admission 12/22/2013 with strep viridans bacteremia status post 4 week course of ceftriaxone.  15. Status post celiac plexus nerve block 02/10/2014.  16. 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 She started Pancrease after an office visit 02/07/2014. She continues to have intermittent diarrhea. She does not take the pancreatic enzyme replacement consistently. 17. Elevated liver enzymes. Improved 05/24/2014. 18. Admission 04/09/2014 with Escherichia coli and fungal bacteremia, completing outpatient ceftriaxone and micafungin. 19. Hypokalemia. She is on a potassium supplement. 20. Status post Port-A-Cath placement  06/14/2014. 21. Anorexia, depression. Remeron initiated 06/20/2014. It is not clear that she is taking Remeron.   Disposition:  [name] [name] has advanced stage pancreas cancer. She is losing weight and has persistent pain. The CA 19-9 was more elevated 08/23/2014. I discussed treatment options with [name] [name] and her daughter. I recommend discontinuing chemotherapy. I recommend hospice care. She is in agreement. We will make a referral to the [facility] program. I discussed CPR and ACLS issues with [name] [name] and her daughter. She is unable to make a decision on CODE STATUS today.  She will continue oxycodone for pain. She will return for an office visit in 2-3 weeks. We will arrange for a palliative paracentesis as needed. She may be a candidate for [employer] in the future.  [name] Coder, MD  09/02/2014  11:41 AM

## 2014-09-06 ENCOUNTER — Ambulatory Visit: Payer: Medicaid Other

## 2014-09-06 ENCOUNTER — Other Ambulatory Visit: Payer: Self-pay | Admitting: *Deleted

## 2014-09-06 DIAGNOSIS — C259 Malignant neoplasm of pancreas, unspecified: Secondary | ICD-10-CM

## 2014-09-06 MED ORDER — LIDOCAINE-PRILOCAINE 2.5-2.5 % EX CREA: 1.0000 "application " | TOPICAL_CREAM | CUTANEOUS | Status: DC | PRN

## 2014-09-06 MED ORDER — PANCRELIPASE (LIP-PROT-AMYL) 12000-38000 UNITS PO CPEP: 12000.0000 [IU] | ORAL_CAPSULE | Freq: Three times a day (TID) | ORAL | Status: AC

## 2014-09-07 ENCOUNTER — Other Ambulatory Visit: Payer: Self-pay | Admitting: *Deleted

## 2014-09-07 ENCOUNTER — Telehealth: Payer: Self-pay | Admitting: *Deleted

## 2014-09-07 MED ORDER — MORPHINE SULFATE (CONCENTRATE) 20 MG/ML PO SOLN: ORAL | Status: DC

## 2014-09-07 NOTE — Telephone Encounter (Signed)
Elmyra Ricks, RN for [facility] reports that pt called stating her pain in uncontrolled with taking Oxycodone 10mg , 1 tablet every 4 hours and pt states she has been taking 2 tablets every 4 hours.  Elmyra Ricks states that Hospice has not visited pt yet.  Discussed with Dr. Benay Spice; notified Elmyra Ricks that MD would like RN to assess pt pain today and ordered Roxanol 20mg /ml, take 1-2 mls every 3 hours as needed.  Elmyra Ricks verbalized understanding and states that she will be visiting pt today.  MD made aware

## 2014-09-08 ENCOUNTER — Other Ambulatory Visit: Payer: Self-pay | Admitting: *Deleted

## 2014-09-08 DIAGNOSIS — C257 Malignant neoplasm of other parts of pancreas: Secondary | ICD-10-CM

## 2014-09-08 MED ORDER — MORPHINE SULFATE (CONCENTRATE) 20 MG/ML PO SOLN: ORAL | Status: DC

## 2014-09-08 NOTE — Telephone Encounter (Signed)
Usual pharmacy ([facility]) does not have this on hand. Need to refax to [facility] [phone]

## 2014-09-13 ENCOUNTER — Other Ambulatory Visit: Payer: Self-pay

## 2014-09-13 ENCOUNTER — Encounter (HOSPITAL_COMMUNITY): Payer: Self-pay | Admitting: Emergency Medicine

## 2014-09-13 ENCOUNTER — Telehealth: Payer: Self-pay | Admitting: *Deleted

## 2014-09-13 ENCOUNTER — Inpatient Hospital Stay (HOSPITAL_COMMUNITY)
Admission: EM | Admit: 2014-09-13 | Discharge: 2014-09-20 | DRG: 853 | Disposition: A | Discharge status: Hospice | Attending: Internal Medicine | Admitting: Internal Medicine

## 2014-09-13 ENCOUNTER — Emergency Department (HOSPITAL_COMMUNITY)

## 2014-09-13 DIAGNOSIS — G8929 Other chronic pain: Secondary | ICD-10-CM | POA: Diagnosis present

## 2014-09-13 DIAGNOSIS — Z87891 Personal history of nicotine dependence: Secondary | ICD-10-CM | POA: Diagnosis not present

## 2014-09-13 DIAGNOSIS — F329 Major depressive disorder, single episode, unspecified: Secondary | ICD-10-CM | POA: Diagnosis present

## 2014-09-13 DIAGNOSIS — Z515 Encounter for palliative care: Secondary | ICD-10-CM | POA: Diagnosis not present

## 2014-09-13 DIAGNOSIS — K219 Gastro-esophageal reflux disease without esophagitis: Secondary | ICD-10-CM | POA: Diagnosis present

## 2014-09-13 DIAGNOSIS — R0602 Shortness of breath: Secondary | ICD-10-CM | POA: Diagnosis present

## 2014-09-13 DIAGNOSIS — K831 Obstruction of bile duct: Secondary | ICD-10-CM

## 2014-09-13 DIAGNOSIS — Z6821 Body mass index (BMI) 21.0-21.9, adult: Secondary | ICD-10-CM | POA: Diagnosis not present

## 2014-09-13 DIAGNOSIS — I10 Essential (primary) hypertension: Secondary | ICD-10-CM | POA: Diagnosis not present

## 2014-09-13 DIAGNOSIS — E785 Hyperlipidemia, unspecified: Secondary | ICD-10-CM | POA: Diagnosis present

## 2014-09-13 DIAGNOSIS — J189 Pneumonia, unspecified organism: Secondary | ICD-10-CM | POA: Diagnosis not present

## 2014-09-13 DIAGNOSIS — Z8249 Family history of ischemic heart disease and other diseases of the circulatory system: Secondary | ICD-10-CM

## 2014-09-13 DIAGNOSIS — C250 Malignant neoplasm of head of pancreas: Secondary | ICD-10-CM | POA: Diagnosis present

## 2014-09-13 DIAGNOSIS — K868 Other specified diseases of pancreas: Secondary | ICD-10-CM | POA: Diagnosis present

## 2014-09-13 DIAGNOSIS — Z7982 Long term (current) use of aspirin: Secondary | ICD-10-CM

## 2014-09-13 DIAGNOSIS — R188 Other ascites: Secondary | ICD-10-CM | POA: Diagnosis present

## 2014-09-13 DIAGNOSIS — M5136 Other intervertebral disc degeneration, lumbar region: Secondary | ICD-10-CM | POA: Diagnosis present

## 2014-09-13 DIAGNOSIS — E119 Type 2 diabetes mellitus without complications: Secondary | ICD-10-CM | POA: Diagnosis present

## 2014-09-13 DIAGNOSIS — N390 Urinary tract infection, site not specified: Secondary | ICD-10-CM | POA: Diagnosis present

## 2014-09-13 DIAGNOSIS — H9190 Unspecified hearing loss, unspecified ear: Secondary | ICD-10-CM | POA: Diagnosis present

## 2014-09-13 DIAGNOSIS — F418 Other specified anxiety disorders: Secondary | ICD-10-CM | POA: Diagnosis present

## 2014-09-13 DIAGNOSIS — Y95 Nosocomial condition: Secondary | ICD-10-CM | POA: Diagnosis present

## 2014-09-13 DIAGNOSIS — Z79899 Other long term (current) drug therapy: Secondary | ICD-10-CM

## 2014-09-13 DIAGNOSIS — Z794 Long term (current) use of insulin: Secondary | ICD-10-CM | POA: Diagnosis not present

## 2014-09-13 DIAGNOSIS — R509 Fever, unspecified: Secondary | ICD-10-CM

## 2014-09-13 DIAGNOSIS — Z923 Personal history of irradiation: Secondary | ICD-10-CM

## 2014-09-13 DIAGNOSIS — B9689 Other specified bacterial agents as the cause of diseases classified elsewhere: Secondary | ICD-10-CM | POA: Diagnosis present

## 2014-09-13 DIAGNOSIS — E440 Moderate protein-calorie malnutrition: Secondary | ICD-10-CM | POA: Diagnosis present

## 2014-09-13 DIAGNOSIS — E876 Hypokalemia: Secondary | ICD-10-CM | POA: Diagnosis not present

## 2014-09-13 DIAGNOSIS — R748 Abnormal levels of other serum enzymes: Secondary | ICD-10-CM | POA: Diagnosis present

## 2014-09-13 DIAGNOSIS — Z8673 Personal history of transient ischemic attack (TIA), and cerebral infarction without residual deficits: Secondary | ICD-10-CM

## 2014-09-13 DIAGNOSIS — A419 Sepsis, unspecified organism: Secondary | ICD-10-CM

## 2014-09-13 DIAGNOSIS — Z833 Family history of diabetes mellitus: Secondary | ICD-10-CM

## 2014-09-13 DIAGNOSIS — A4159 Other Gram-negative sepsis: Principal | ICD-10-CM | POA: Diagnosis present

## 2014-09-13 DIAGNOSIS — R7881 Bacteremia: Secondary | ICD-10-CM

## 2014-09-13 DIAGNOSIS — Z79891 Long term (current) use of opiate analgesic: Secondary | ICD-10-CM

## 2014-09-13 DIAGNOSIS — R64 Cachexia: Secondary | ICD-10-CM | POA: Diagnosis present

## 2014-09-13 DIAGNOSIS — R109 Unspecified abdominal pain: Secondary | ICD-10-CM

## 2014-09-13 DIAGNOSIS — F419 Anxiety disorder, unspecified: Secondary | ICD-10-CM | POA: Diagnosis present

## 2014-09-13 DIAGNOSIS — C259 Malignant neoplasm of pancreas, unspecified: Secondary | ICD-10-CM | POA: Diagnosis present

## 2014-09-13 DIAGNOSIS — B961 Klebsiella pneumoniae [K. pneumoniae] as the cause of diseases classified elsewhere: Secondary | ICD-10-CM

## 2014-09-13 LAB — COMPREHENSIVE METABOLIC PANEL WITH GFR
ALT: 20 U/L (ref 0–35)
AST: 66 U/L — ABNORMAL HIGH (ref 0–37)
Albumin: 2.4 g/dL — ABNORMAL LOW (ref 3.5–5.2)
Alkaline Phosphatase: 664 U/L — ABNORMAL HIGH (ref 39–117)
Anion gap: 14 (ref 5–15)
BUN: 12 mg/dL (ref 6–23)
CO2: 23 meq/L (ref 19–32)
Calcium: 8.4 mg/dL (ref 8.4–10.5)
Chloride: 100 meq/L (ref 96–112)
Creatinine, Ser: 0.74 mg/dL (ref 0.50–1.10)
GFR calc Af Amer: >90 mL/min
GFR calc non Af Amer: >90 mL/min
Glucose, Bld: 146 mg/dL — ABNORMAL HIGH (ref 70–99)
Potassium: 4 meq/L (ref 3.7–5.3)
Sodium: 137 meq/L (ref 137–147)
Total Bilirubin: 2.1 mg/dL — ABNORMAL HIGH (ref 0.3–1.2)
Total Protein: 7 g/dL (ref 6.0–8.3)

## 2014-09-13 LAB — PROTIME-INR
INR: 1.36 (ref 0.00–1.49)
Prothrombin Time: 16.9 s — ABNORMAL HIGH (ref 11.6–15.2)

## 2014-09-13 LAB — CBC WITH DIFFERENTIAL/PLATELET
BASOS PCT: 0 % (ref 0–1)
Basophils Absolute: 0 10*3/uL (ref 0.0–0.1)
EOS PCT: 0 % (ref 0–5)
Eosinophils Absolute: 0 10*3/uL (ref 0.0–0.7)
HCT: 25.3 % — ABNORMAL LOW (ref 36.0–46.0)
Hemoglobin: 8.1 g/dL — ABNORMAL LOW (ref 12.0–15.0)
LYMPHS ABS: 0.7 10*3/uL (ref 0.7–4.0)
Lymphocytes Relative: 4 % — ABNORMAL LOW (ref 12–46)
MCH: 24 pg — AB (ref 26.0–34.0)
MCHC: 32 g/dL (ref 30.0–36.0)
MCV: 74.9 fL — ABNORMAL LOW (ref 78.0–100.0)
Monocytes Absolute: 0.8 10*3/uL (ref 0.1–1.0)
Monocytes Relative: 4 % (ref 3–12)
NEUTROS PCT: 92 % — AB (ref 43–77)
Neutro Abs: 17.7 10*3/uL — ABNORMAL HIGH (ref 1.7–7.7)
PLATELETS: 358 10*3/uL (ref 150–400)
RBC: 3.38 MIL/uL — AB (ref 3.87–5.11)
RDW: 19.4 % — AB (ref 11.5–15.5)
WBC: 19.3 10*3/uL — ABNORMAL HIGH (ref 4.0–10.5)

## 2014-09-13 LAB — LIPASE, BLOOD: Lipase: 4 U/L — ABNORMAL LOW (ref 11–59)

## 2014-09-13 LAB — URINALYSIS, ROUTINE W REFLEX MICROSCOPIC
Glucose, UA: NEGATIVE mg/dL
Hgb urine dipstick: NEGATIVE
Ketones, ur: NEGATIVE mg/dL
Nitrite: POSITIVE — AB
Protein, ur: 30 mg/dL — AB
Specific Gravity, Urine: 1.024 (ref 1.005–1.030)
Urobilinogen, UA: 2 mg/dL — ABNORMAL HIGH (ref 0.0–1.0)
pH: 5.5 (ref 5.0–8.0)

## 2014-09-13 LAB — URINE MICROSCOPIC-ADD ON

## 2014-09-13 LAB — I-STAT TROPONIN, ED: Troponin i, poc: 0 ng/mL (ref 0.00–0.08)

## 2014-09-13 LAB — I-STAT CG4 LACTIC ACID, ED: Lactic Acid, Venous: 0.7 mmol/L (ref 0.5–2.2)

## 2014-09-13 LAB — APTT: APTT: 33 s (ref 24–37)

## 2014-09-13 MED ORDER — INSULIN GLARGINE 100 UNIT/ML ~~LOC~~ SOLN
30.0000 [IU] | Freq: Every day | SUBCUTANEOUS | Status: DC
  Administered 2014-09-13 – 2014-09-15 (×2): 30 [IU] via SUBCUTANEOUS
  Filled 2014-09-13 (×3): qty 0.3

## 2014-09-13 MED ORDER — POLYETHYLENE GLYCOL 3350 17 G PO PACK
17.0000 g | PACK | Freq: Every day | ORAL | Status: DC | PRN
  Filled 2014-09-13: qty 1

## 2014-09-13 MED ORDER — LIDOCAINE-PRILOCAINE 2.5-2.5 % EX CREA: 1.0000 "application " | TOPICAL_CREAM | CUTANEOUS | Status: DC | PRN

## 2014-09-13 MED ORDER — DEXTROSE 5 % IV SOLN
1.0000 g | Freq: Once | INTRAVENOUS | Status: AC
  Administered 2014-09-13: 1 g via INTRAVENOUS
  Filled 2014-09-13 (×2): qty 1

## 2014-09-13 MED ORDER — MORPHINE SULFATE (CONCENTRATE) 10 MG /0.5 ML PO SOLN
20.0000 mg | ORAL | Status: DC | PRN
  Administered 2014-09-13: 20 mg via ORAL
  Filled 2014-09-13: qty 1

## 2014-09-13 MED ORDER — PANTOPRAZOLE SODIUM 40 MG PO TBEC
40.0000 mg | DELAYED_RELEASE_TABLET | Freq: Every day | ORAL | Status: DC
  Administered 2014-09-14 – 2014-09-20 (×7): 40 mg via ORAL
  Filled 2014-09-13 (×7): qty 1

## 2014-09-13 MED ORDER — DEXTROSE 5 % IV SOLN
1.0000 g | Freq: Three times a day (TID) | INTRAVENOUS | Status: DC
  Administered 2014-09-14 (×3): 1 g via INTRAVENOUS
  Filled 2014-09-13 (×4): qty 1

## 2014-09-13 MED ORDER — SODIUM CHLORIDE 0.9 % IV SOLN
250.0000 mL | INTRAVENOUS | Status: DC | PRN
  Administered 2014-09-15: 250 mL via INTRAVENOUS

## 2014-09-13 MED ORDER — DEXTROSE 5 % IV SOLN
2.0000 g | Freq: Three times a day (TID) | INTRAVENOUS | Status: DC
  Filled 2014-09-13: qty 2

## 2014-09-13 MED ORDER — VANCOMYCIN HCL IN DEXTROSE 1-5 GM/200ML-% IV SOLN
1000.0000 mg | INTRAVENOUS | Status: AC
  Administered 2014-09-13: 1000 mg via INTRAVENOUS
  Filled 2014-09-13: qty 200

## 2014-09-13 MED ORDER — SODIUM CHLORIDE 0.9 % IJ SOLN: 3.0000 mL | INTRAMUSCULAR | Status: DC | PRN

## 2014-09-13 MED ORDER — MIRTAZAPINE 15 MG PO TABS
15.0000 mg | ORAL_TABLET | Freq: Every day | ORAL | Status: DC
  Administered 2014-09-13 – 2014-09-18 (×5): 15 mg via ORAL
  Filled 2014-09-13 (×8): qty 1

## 2014-09-13 MED ORDER — SIMVASTATIN 10 MG PO TABS
10.0000 mg | ORAL_TABLET | Freq: Every day | ORAL | Status: DC
  Administered 2014-09-13 – 2014-09-18 (×5): 10 mg via ORAL
  Filled 2014-09-13 (×8): qty 1

## 2014-09-13 MED ORDER — ONDANSETRON 8 MG PO TBDP
8.0000 mg | ORAL_TABLET | Freq: Three times a day (TID) | ORAL | Status: DC | PRN
  Administered 2014-09-17 – 2014-09-19 (×2): 8 mg via ORAL
  Filled 2014-09-13 (×2): qty 1

## 2014-09-13 MED ORDER — ASPIRIN EC 81 MG PO TBEC
81.0000 mg | DELAYED_RELEASE_TABLET | Freq: Every day | ORAL | Status: DC
  Administered 2014-09-14 – 2014-09-20 (×7): 81 mg via ORAL
  Filled 2014-09-13 (×7): qty 1

## 2014-09-13 MED ORDER — MORPHINE SULFATE (CONCENTRATE) 10 MG /0.5 ML PO SOLN
40.0000 mg | ORAL | Status: DC | PRN
  Administered 2014-09-13 – 2014-09-18 (×6): 40 mg via ORAL
  Filled 2014-09-13 (×6): qty 2

## 2014-09-13 MED ORDER — METOCLOPRAMIDE HCL 5 MG PO TABS
5.0000 mg | ORAL_TABLET | Freq: Three times a day (TID) | ORAL | Status: DC
  Administered 2014-09-14 – 2014-09-19 (×9): 5 mg via ORAL
  Filled 2014-09-13 (×22): qty 1

## 2014-09-13 MED ORDER — SODIUM CHLORIDE 0.9 % IV SOLN: 1000.0000 mL | Freq: Once | INTRAVENOUS | Status: DC

## 2014-09-13 MED ORDER — SUCRALFATE 1 GM/10ML PO SUSP
1.0000 g | Freq: Two times a day (BID) | ORAL | Status: DC
  Administered 2014-09-13 – 2014-09-19 (×8): 1 g via ORAL
  Filled 2014-09-13 (×15): qty 10

## 2014-09-13 MED ORDER — GABAPENTIN 300 MG PO CAPS
300.0000 mg | ORAL_CAPSULE | Freq: Three times a day (TID) | ORAL | Status: DC
  Administered 2014-09-13 – 2014-09-20 (×14): 300 mg via ORAL
  Filled 2014-09-13 (×22): qty 1

## 2014-09-13 MED ORDER — SODIUM CHLORIDE 0.9 % IJ SOLN: 3.0000 mL | Freq: Two times a day (BID) | INTRAMUSCULAR | Status: DC

## 2014-09-13 MED ORDER — VANCOMYCIN HCL IN DEXTROSE 750-5 MG/150ML-% IV SOLN
750.0000 mg | Freq: Two times a day (BID) | INTRAVENOUS | Status: DC
  Administered 2014-09-14 – 2014-09-16 (×5): 750 mg via INTRAVENOUS
  Filled 2014-09-13 (×5): qty 150

## 2014-09-13 MED ORDER — OXYCODONE HCL 5 MG PO TABS
10.0000 mg | ORAL_TABLET | ORAL | Status: DC | PRN
  Administered 2014-09-16 – 2014-09-17 (×2): 10 mg via ORAL
  Filled 2014-09-13 (×2): qty 2

## 2014-09-13 MED ORDER — SODIUM CHLORIDE 0.9 % IV SOLN
INTRAVENOUS | Status: DC
  Administered 2014-09-13: 20:00:00 via INTRAVENOUS

## 2014-09-13 MED ORDER — POTASSIUM CHLORIDE CRYS ER 10 MEQ PO TBCR
10.0000 meq | EXTENDED_RELEASE_TABLET | Freq: Every day | ORAL | Status: DC
  Administered 2014-09-14 – 2014-09-19 (×6): 10 meq via ORAL
  Filled 2014-09-13 (×7): qty 1

## 2014-09-13 MED ORDER — SODIUM CHLORIDE 0.9 % IV SOLN
INTRAVENOUS | Status: DC
  Administered 2014-09-13: via INTRAVENOUS

## 2014-09-13 MED ORDER — INSULIN ASPART 100 UNIT/ML ~~LOC~~ SOLN
10.0000 [IU] | Freq: Three times a day (TID) | SUBCUTANEOUS | Status: DC
  Administered 2014-09-14: 10 [IU] via SUBCUTANEOUS

## 2014-09-13 MED ORDER — PANCRELIPASE (LIP-PROT-AMYL) 12000-38000 UNITS PO CPEP
12000.0000 [IU] | ORAL_CAPSULE | Freq: Three times a day (TID) | ORAL | Status: DC
  Administered 2014-09-14 – 2014-09-20 (×11): 12000 [IU] via ORAL
  Filled 2014-09-13 (×22): qty 1

## 2014-09-13 NOTE — ED Notes (Addendum)
Pt denies being able to use the restroom right now. Pt states having to be in and out cathed before. "I havent went to the bathroom since the afternoon"

## 2014-09-13 NOTE — H&P (Signed)
PCP:   Philis Fendt, MD   Chief Complaint:  Sob, cough, and fever  HPI: 58 yo female h/o stage 4 pancreatic cancer with several days of cough, fever of 103 at home, and sob.  She denies any pain.  She is not very forthcoming with her answers, she does not really know if she has hospice at home.  But she says she is "taking a break" from chemotherapy for now.  cxr shows pna.  Review of Systems:  Positive and negative as per HPI otherwise all other systems are negative but highly unreliable  Past Medical History: Past Medical History  Diagnosis Date  . DM (diabetes mellitus), type 2     requires insulin  . Hypertension   . Depression   . Anxiety   . Hyperlipidemia   . Pancreatitis   . GERD (gastroesophageal reflux disease)   . Pancreatic cancer 05/2013    adenocarcinoma on ERCP/FNA  . Hearing impaired     lost the last of her hearing aids and cn not get another one.  hearing is better via right ear.   Marland Kitchen History of radiation therapy 07/20/13-08/09/13    Pancreas 37.5Gy  . DDD (degenerative disc disease), lumbar   . Chronic back pain   . Stroke   . Anemia   . Ulcer 01/2014    radiation gastroduodenitis with duodenal ulcer.    Past Surgical History  Procedure Laterality Date  . Appendectomy    . Ercp N/A 05/24/2013    Procedure: ENDOSCOPIC RETROGRADE CHOLANGIOPANCREATOGRAPHY (ERCP);  Surgeon: Milus Banister, MD;  Location: [place];  Service: Endoscopy;  Laterality: N/A;  MAC vs. general per anesthesia   . Tubal ligation    . Endoscopic retrograde cholangiopancreatography (ercp) with propofol N/A 10/21/2013    Procedure: ENDOSCOPIC RETROGRADE CHOLANGIOPANCREATOGRAPHY (ERCP) WITH PROPOFOL;  Surgeon: Milus Banister, MD;  Location: [facility];  Service: Endoscopy;  Laterality: N/A;  Stent removal   . Biliary stent placement N/A 10/21/2013    Procedure: BILIARY STENT PLACEMENT;  Surgeon: Milus Banister, MD;  Location: [facility];  Service: Endoscopy;  Laterality: N/A;  . Tee  without cardioversion N/A 12/29/2013    Procedure: TRANSESOPHAGEAL ECHOCARDIOGRAM (TEE);  Surgeon: Dorothy Spark, MD;  Location: [facility];  Service: Cardiovascular;  Laterality: N/A;  . Eus N/A 02/10/2014    Procedure: UPPER ENDOSCOPIC ULTRASOUND (EUS) LINEAR;  Surgeon: Milus Banister, MD;  Location: [facility];  Service: Endoscopy;  Laterality: N/A;  celiac plexus neurolysis  . Bile duct stent placement      Medications: Prior to Admission medications   Medication Sig Start Date End Date Taking? Authorizing Provider  amLODipine (NORVASC) 10 MG tablet Take 10 mg by mouth every morning.   Yes Historical Provider, MD  aspirin EC 81 MG tablet Take 81 mg by mouth daily.   Yes Historical Provider, MD  gabapentin (NEURONTIN) 300 MG capsule Take 300 mg by mouth 3 (three) times daily.   Yes Historical Provider, MD  insulin aspart (NOVOLOG) 100 UNIT/ML injection Inject 10 Units into the skin 3 (three) times daily with meals.   Yes Historical Provider, MD  insulin glargine (LANTUS) 100 UNIT/ML injection Inject 30 Units into the skin at bedtime.    Yes Historical Provider, MD  lidocaine-prilocaine (EMLA) cream Apply 1 application topically as needed (porta cath).   Yes Historical Provider, MD  lipase/protease/amylase (CREON) 12000 UNITS CPEP capsule Take 1 capsule (12,000 Units total) by mouth 3 (three) times daily before meals. 09/06/14  Yes Owens Shark, NP  lisinopril (PRINIVIL,ZESTRIL) 40 MG tablet Take 40 mg by mouth every morning.    Yes Historical Provider, MD  metoCLOPramide (REGLAN) 5 MG tablet Take 5 mg by mouth 3 (three) times daily before meals.    Yes Historical Provider, MD  metoprolol tartrate (LOPRESSOR) 25 MG tablet Take 25 mg by mouth 2 (two) times daily.   Yes Historical Provider, MD  mirtazapine (REMERON) 15 MG tablet Take 1 tablet (15 mg total) by mouth at bedtime. 06/20/14  Yes Owens Shark, NP  morphine (ROXANOL) 20 MG/ML concentrated solution Take 40 mg by mouth every 3  (three) hours as needed for severe pain (severe pain). 09/08/14  Yes Ladell Pier, MD  ondansetron (ZOFRAN-ODT) 8 MG disintegrating tablet Take 8 mg by mouth every 8 (eight) hours as needed for nausea or vomiting (nausea & vomiting).   Yes Historical Provider, MD  Oxycodone HCl 10 MG TABS Take 10 mg by mouth every 4 (four) hours as needed (severe pain).   Yes Historical Provider, MD  pantoprazole (PROTONIX) 40 MG tablet Take 40 mg by mouth daily.   Yes Historical Provider, MD  polyethylene glycol (MIRALAX / GLYCOLAX) packet Take 17 g by mouth daily as needed for mild constipation (constipation).   Yes Historical Provider, MD  potassium chloride (K-DUR,KLOR-CON) 10 MEQ tablet Take 10 mEq by mouth daily.   Yes Historical Provider, MD  simethicone (MYLICON) 622 MG chewable tablet Chew 125 mg by mouth every 6 (six) hours as needed for flatulence.   Yes Historical Provider, MD  simvastatin (ZOCOR) 10 MG tablet Take 10 mg by mouth at bedtime.   Yes Historical Provider, MD  sucralfate (CARAFATE) 1 GM/10ML suspension Take 1 g by mouth 2 (two) times daily.   Yes Historical Provider, MD  cetirizine (ZYRTEC) 10 MG tablet Take 10 mg by mouth daily as needed for allergies.     Historical Provider, MD  PRESCRIPTION MEDICATION Chemotherapy at [facility]    Historical Provider, MD    Allergies:   Allergies  Allergen Reactions  . Betadine [Povidone Iodine] Itching and Rash    Social History:  reports that she quit smoking about 15 months ago. She has never used smokeless tobacco. She reports that she does not drink alcohol or use illicit drugs.  Family History: Family History  Problem Relation Age of Onset  . Diabetes type II Mother   . Hypertension Mother   . Heart attack Father   . HIV/AIDS Brother     Physical Exam: Filed Vitals:   09/13/14 2015 09/13/14 2024 09/13/14 2058 09/13/14 2204  BP:   113/60 136/62  Pulse:   88 85  Temp:   98 F (36.7 C) 98.3 F (36.8 C)  TempSrc:   Oral Oral  Resp:  23  18 18   Height:  5\' 2"  (1.575 m)    Weight:  53.524 kg (118 lb)    SpO2:   100% 100%   General appearance: alert, cooperative, cachectic and no distress Head: Normocephalic, without obvious abnormality, atraumatic Eyes: negative Nose: Nares normal. Septum midline. Mucosa normal. No drainage or sinus tenderness. Neck: no JVD and supple, symmetrical, trachea midline Lungs: clear to auscultation bilaterally Heart: regular rate and rhythm, S1, S2 normal, no murmur, click, rub or gallop Abdomen: abnormal findings:  ascites Extremities: extremities normal, atraumatic, no cyanosis or edema Pulses: 2+ and symmetric Skin: Skin color, texture, turgor normal. No rashes or lesions Neurologic: Grossly normal    Labs on Admission:  Recent Labs  09/13/14 1857  NA 137  K 4.0  CL 100  CO2 23  GLUCOSE 146*  BUN 12  CREATININE 0.74  CALCIUM 8.4    Recent Labs  09/13/14 1857  AST 66*  ALT 20  ALKPHOS 664*  BILITOT 2.1*  PROT 7.0  ALBUMIN 2.4*    Recent Labs  09/13/14 1857  LIPASE 4*    Recent Labs  09/13/14 1857  WBC 19.3*  NEUTROABS 17.7*  HGB 8.1*  HCT 25.3*  MCV 74.9*  PLT 358    Radiological Exams on Admission: Dg Chest 2 View  09/13/2014   CLINICAL DATA:  Fever and weakness, history of pancreatic carcinoma  EXAM: CHEST  2 VIEW  COMPARISON:  04/09/2014  FINDINGS: Cardiac shadow is within normal limits. The lungs are well aerated bilaterally. Mild left basilar atelectasis is seen. A right jugular chest wall port is noted in satisfactory position. Degenerative change of thoracic spine is noted.  IMPRESSION: Minimal left basilar atelectasis. No other focal abnormality is noted.   Electronically Signed   By: Inez Catalina M.D.   On: 09/13/2014 19:00    Assessment/Plan  58 yo female with pancreatic cancer and possible pna  Principal Problem:   Pneumonia-  Place on hcap coverage with cefepime and vancomycin.  afvss here.  cxr with possible infiltrate and in  setting of cough, fever and immunecompromised state would treat with broad abx initially.  Follow clinical response.  pna pathway.  Active Problems:  Stable unless o/w noted   HTN (hypertension)- hold her bp meds due to her soft bp at this time.   Dyslipidemia   Pancreatic cancer  Pt may benefit from palliative care consult.  Admit to medical.  Full code.  Marguis Mathieson A 09/13/2014, 11:00 PM

## 2014-09-13 NOTE — ED Notes (Signed)
Patient transported to X-ray 

## 2014-09-13 NOTE — Telephone Encounter (Signed)
1333: Message from Ignacia Palma with hospice reporting pt had fever of 103.2 axillary today. BP 152/80 HR 114. Nurse was unable to auscultate lungs because pt would not take a deep breath. "Confused and delirious." 1430: Returned call, Elmyra Ricks reports she gave pt Tylenol and temp went down to 99.6 (A). She reports pt has a productive cough, runny nose and shaking chills. Reviewed with Ned Card, NP: Pt will need to be evaluated in ED. Has a stent. Called pt's daughter with instructions to take her to ED for evaluation. Hospice nurse, Elmyra Ricks made aware.

## 2014-09-13 NOTE — Progress Notes (Signed)
  CARE MANAGEMENT ED NOTE 09/13/2014  Patient:  [name] [name], [name] [name]   Account Number:  [id number]  Date Initiated:  09/13/2014  Documentation initiated by:  Livia Snellen  Subjective/Objective Assessment:   Patient presents to Ed with fever of 103.2, increased confusion     Subjective/Objective Assessment Detail:   Patient with pmhx of pancreatic cancer.  WBC 19.3     Action/Plan:   Action/Plan Detail:   Anticipated DC Date:       Status Recommendation to Physician:   Result of Recommendation:    Other ED [place]  Other  PCP issues    Choice offered to / List presented to:            Status of service:  Completed, signed off  ED Comments:   ED Comments Detail:  EDCM spoke to patient and her family at bedside.  Patient reports [place] with [place].  Patient's son not at bedside.  Patient reports she has a cane, walker, BSC, wheelchair, shower chair and hospital bed at home.  EDCM questioned patient and her family which home hospice agency she has?  Patient and her family did not know the name of their hospice agency.  Patient reports [facility] sounds familiar.  Upon chart review, patient was referred to [facility] through her oncologist.  No further [facility] needs at this time.

## 2014-09-13 NOTE — Progress Notes (Signed)
ANTIBIOTIC CONSULT NOTE - INITIAL  Pharmacy Consult for Vancomycin Indication: pneumonia  Allergies  Allergen Reactions  . Betadine [Povidone Iodine] Itching and Rash    Patient Measurements: Height: 5\' 2"  (157.5 cm) Weight: 118 lb (53.524 kg) IBW/kg (Calculated) : 50.1  Vital Signs: Temp: 98.2 F (36.8 C) (10/27 1833) Temp Source: Oral (10/27 1833) BP: 100/57 mmHg (10/27 1833) Pulse Rate: 89 (10/27 1833) Intake/Output from previous day:   Intake/Output from this shift:    Labs:  Recent Labs  09/13/14 1857  WBC 19.3*  HGB 8.1*  PLT 358  CREATININE 0.74   Estimated Creatinine Clearance: 59.1 ml/min (by C-G formula based on Cr of 0.74). No results found for this basename: VANCOTROUGH, VANCOPEAK, VANCORANDOM, GENTTROUGH, GENTPEAK, GENTRANDOM, TOBRATROUGH, TOBRAPEAK, TOBRARND, AMIKACINPEAK, AMIKACINTROU, AMIKACIN,  in the last 72 hours   Microbiology: No results found for this or any previous visit (from the past 720 hour(s)).  Medical History: Past Medical History  Diagnosis Date  . DM (diabetes mellitus), type 2     requires insulin  . Hypertension   . Depression   . Anxiety   . Hyperlipidemia   . Pancreatitis   . GERD (gastroesophageal reflux disease)   . Pancreatic cancer 05/2013    adenocarcinoma on ERCP/FNA  . Hearing impaired     lost the last of her hearing aids and cn not get another one.  hearing is better via right ear.   Marland Kitchen History of radiation therapy 07/20/13-08/09/13    Pancreas 37.5Gy  . DDD (degenerative disc disease), lumbar   . Chronic back pain   . Stroke   . Anemia   . Ulcer 01/2014    radiation gastroduodenitis with duodenal ulcer.      Assessment: 57 yoF with end-stage pancreatic cancer s/p recent chemotherapy (Abraxane/Gemcitabine completed 08/23/14, further chemo discontinued) presents with productive cough and fever.  Temperature of 103 taken PTA by home health nurse.  Pharmacy consulted to dose vancomycin.  Cefepime 1g x 1 ordered.   CXR: Minimal left basilar atelectasis. No other focal abnormality is noted.  Tmax: 103 (PTA), currently afebrile WBC: elevated at 19.3 Renal: SCr 0.74, CrCl~62 ml/min (CG, using adjusted SCr 0.8 for age) Blood and urine cultures ordered.  Goal of Therapy:  Vancomycin trough level 15-20 mcg/ml Doses adjusted per renal function Eradication of infection  Plan:  1.  Vancomycin 1g x 1 then 750 mg IV q12h. 2.  F/u for further Cefepime orders.  Cefepime x 1 ordered only. 3.  F/u SCr, trough levels, culture results, clinical course.  Hershal Coria 09/13/2014,8:32 PM

## 2014-09-13 NOTE — ED Notes (Signed)
Patient reports hypoglycemia today in the 90s which caused her to be shaky. Ate meal. Hx DM. Hasn't taken insulin as scheduled recently due to decreased PO intake and decreased BG. Then reports 103 F axillary temperature. Home RN gave 325 mg Tylenol. Temperature was WDL after this. Took Morphine this morning for pain around 1200. Is currently being treated for pancreatic CA and is hospice care right now. Currently not receiving chemotherapy or radiation at this time. Reports feeling extremely fatigued. Had BM today. Denies dysuria or urine changes. Reports blood clots in urine last week. No other complaints/concerns at this time.

## 2014-09-13 NOTE — ED Provider Notes (Signed)
CSN: [id number]     Arrival date & time 09/13/14  1735 History   First MD Initiated Contact with Patient 09/13/14 1815     No chief complaint on file.    (Consider location/radiation/quality/duration/timing/severity/associated sxs/prior Treatment) HPI The patient has end-stage pancreatic cancer. She reports that for the past week she has been developing some cough and sputum production. She reports she's had cold symptoms of congestion, and sneezing. The patient reports that today a temperature of 103 was taken by her home health care nurse. The patient had contacted her physician and was advised to come to emergency department for evaluation for possible pneumonia or other infectious illness. She denies that she's had any vomiting or diarrhea. She reports she did have normal bowel movement today. She is not complaining of any acute abdominal pain. She however does have pancreatic cancer and is tonically treated for pain with Roxanol. The patient reports several days ago she did notice some blood in her urine. She however denies dysuria urgency or frequency. She reports she has had swelling in her left lower leg for a couple of weeks now. She denies any significant associated pain.  Past Medical History  Diagnosis Date  . DM (diabetes mellitus), type 2     requires insulin  . Hypertension   . Depression   . Anxiety   . Hyperlipidemia   . Pancreatitis   . GERD (gastroesophageal reflux disease)   . Pancreatic cancer 05/2013    adenocarcinoma on ERCP/FNA  . Hearing impaired     lost the last of her hearing aids and cn not get another one.  hearing is better via right ear.   Marland Kitchen History of radiation therapy 07/20/13-08/09/13    Pancreas 37.5Gy  . DDD (degenerative disc disease), lumbar   . Chronic back pain   . Stroke   . Anemia   . Ulcer 01/2014    radiation gastroduodenitis with duodenal ulcer.    Past Surgical History  Procedure Laterality Date  . Appendectomy    . Ercp N/A 05/24/2013     Procedure: ENDOSCOPIC RETROGRADE CHOLANGIOPANCREATOGRAPHY (ERCP);  Surgeon: Milus Banister, MD;  Location: [place];  Service: Endoscopy;  Laterality: N/A;  MAC vs. general per anesthesia   . Tubal ligation    . Endoscopic retrograde cholangiopancreatography (ercp) with propofol N/A 10/21/2013    Procedure: ENDOSCOPIC RETROGRADE CHOLANGIOPANCREATOGRAPHY (ERCP) WITH PROPOFOL;  Surgeon: Milus Banister, MD;  Location: [facility];  Service: Endoscopy;  Laterality: N/A;  Stent removal   . Biliary stent placement N/A 10/21/2013    Procedure: BILIARY STENT PLACEMENT;  Surgeon: Milus Banister, MD;  Location: [facility];  Service: Endoscopy;  Laterality: N/A;  . Tee without cardioversion N/A 12/29/2013    Procedure: TRANSESOPHAGEAL ECHOCARDIOGRAM (TEE);  Surgeon: Dorothy Spark, MD;  Location: [place];  Service: Cardiovascular;  Laterality: N/A;  . Eus N/A 02/10/2014    Procedure: UPPER ENDOSCOPIC ULTRASOUND (EUS) LINEAR;  Surgeon: Milus Banister, MD;  Location: [facility];  Service: Endoscopy;  Laterality: N/A;  celiac plexus neurolysis  . Bile duct stent placement     Family History  Problem Relation Age of Onset  . Diabetes type II Mother   . Hypertension Mother   . Heart attack Father   . HIV/AIDS Brother    History  Substance Use Topics  . Smoking status: Former Smoker -- 0.50 packs/day for 42 years    Quit date: 05/28/2013  . Smokeless tobacco: Never Used  . Alcohol Use: No  OB History   Grav Para Term Preterm Abortions TAB SAB Ect Mult Living                 Review of Systems  10 Systems reviewed and are negative for acute change except as noted in the HPI.   Allergies  Betadine  Home Medications   Prior to Admission medications   Medication Sig Start Date End Date Taking? Authorizing Provider  amLODipine (NORVASC) 10 MG tablet Take 10 mg by mouth every morning.   Yes Historical Provider, MD  aspirin EC 81 MG tablet Take 81 mg by mouth daily.   Yes Historical  Provider, MD  gabapentin (NEURONTIN) 300 MG capsule Take 300 mg by mouth 3 (three) times daily.   Yes Historical Provider, MD  insulin aspart (NOVOLOG) 100 UNIT/ML injection Inject 10 Units into the skin 3 (three) times daily with meals.   Yes Historical Provider, MD  insulin glargine (LANTUS) 100 UNIT/ML injection Inject 30 Units into the skin at bedtime.    Yes Historical Provider, MD  lidocaine-prilocaine (EMLA) cream Apply 1 application topically as needed (porta cath).   Yes Historical Provider, MD  lipase/protease/amylase (CREON) 12000 UNITS CPEP capsule Take 1 capsule (12,000 Units total) by mouth 3 (three) times daily before meals. 09/06/14  Yes Owens Shark, NP  lisinopril (PRINIVIL,ZESTRIL) 40 MG tablet Take 40 mg by mouth every morning.    Yes Historical Provider, MD  metoCLOPramide (REGLAN) 5 MG tablet Take 5 mg by mouth 3 (three) times daily before meals.    Yes Historical Provider, MD  metoprolol tartrate (LOPRESSOR) 25 MG tablet Take 25 mg by mouth 2 (two) times daily.   Yes Historical Provider, MD  mirtazapine (REMERON) 15 MG tablet Take 1 tablet (15 mg total) by mouth at bedtime. 06/20/14  Yes Owens Shark, NP  morphine (ROXANOL) 20 MG/ML concentrated solution Take 40 mg by mouth every 3 (three) hours as needed for severe pain (severe pain). 09/08/14  Yes Ladell Pier, MD  ondansetron (ZOFRAN-ODT) 8 MG disintegrating tablet Take 8 mg by mouth every 8 (eight) hours as needed for nausea or vomiting (nausea & vomiting).   Yes Historical Provider, MD  Oxycodone HCl 10 MG TABS Take 10 mg by mouth every 4 (four) hours as needed (severe pain).   Yes Historical Provider, MD  pantoprazole (PROTONIX) 40 MG tablet Take 40 mg by mouth daily.   Yes Historical Provider, MD  polyethylene glycol (MIRALAX / GLYCOLAX) packet Take 17 g by mouth daily as needed for mild constipation (constipation).   Yes Historical Provider, MD  potassium chloride (K-DUR,KLOR-CON) 10 MEQ tablet Take 10 mEq by mouth  daily.   Yes Historical Provider, MD  simethicone (MYLICON) 263 MG chewable tablet Chew 125 mg by mouth every 6 (six) hours as needed for flatulence.   Yes Historical Provider, MD  simvastatin (ZOCOR) 10 MG tablet Take 10 mg by mouth at bedtime.   Yes Historical Provider, MD  sucralfate (CARAFATE) 1 GM/10ML suspension Take 1 g by mouth 2 (two) times daily.   Yes Historical Provider, MD  cetirizine (ZYRTEC) 10 MG tablet Take 10 mg by mouth daily as needed for allergies.     Historical Provider, MD  PRESCRIPTION MEDICATION Chemotherapy at [facility]    Historical Provider, MD   BP 100/57  Pulse 89  Temp(Src) 98.2 F (36.8 C) (Oral)  Resp 13  Ht 5\' 2"  (1.575 m)  Wt 118 lb (53.524 kg)  BMI 21.58 kg/m2  SpO2  100% Physical Exam  Constitutional: She is oriented to person, place, and time.  The patient is nontoxic and alert with no respiratory distress. She does appear deconditioned.  HENT:  Head: Normocephalic and atraumatic.  Nose: Nose normal.  The patient's oropharynx is widely patent. She does have white plaque material on her tongue. Her neck is supple without meningismus.  Eyes: EOM are normal. Pupils are equal, round, and reactive to light. Right eye exhibits no discharge. Left eye exhibits no discharge.  Patient has some exophthalmos and mild icteric conjunctiva  Neck: Neck supple.  Cardiovascular: Normal rate, regular rhythm, normal heart sounds and intact distal pulses.   Pulmonary/Chest: Effort normal. No respiratory distress. She has no wheezes.  Patient has occasional wet cough. She has scattered rhonchi that clear with cough. Breath sounds are soft and diminished at the bases.  Abdominal: Soft. Bowel sounds are normal. There is no rebound and no guarding.  The patient's abdomen is soft. To deeper palpation she endorses some discomfort in the upper abdomen. She reports this is not atypical for her condition. She does not endorse any new or exquisite abdominal pain.  Musculoskeletal:   Patient has asymmetric edema of the left lower extremity approximately 1+ and pitting. The skin condition of both lower extremities is very good without any evidence of cellulitis or ulcers.  Neurological: She is alert and oriented to person, place, and time. Coordination normal.  All the patient's movements are coordinated purposeful and symmetric.  Skin: Skin is warm and dry.  Psychiatric: She has a normal mood and affect.    ED Course  Procedures (including critical care time) Labs Review Labs Reviewed  COMPREHENSIVE METABOLIC PANEL - Abnormal; Notable for the following:    Glucose, Bld 146 (*)    Albumin 2.4 (*)    AST 66 (*)    Alkaline Phosphatase 664 (*)    Total Bilirubin 2.1 (*)    All other components within normal limits  LIPASE, BLOOD - Abnormal; Notable for the following:    Lipase 4 (*)    All other components within normal limits  CBC WITH DIFFERENTIAL - Abnormal; Notable for the following:    WBC 19.3 (*)    RBC 3.38 (*)    Hemoglobin 8.1 (*)    HCT 25.3 (*)    MCV 74.9 (*)    MCH 24.0 (*)    RDW 19.4 (*)    Neutrophils Relative % 92 (*)    Neutro Abs 17.7 (*)    Lymphocytes Relative 4 (*)    All other components within normal limits  PROTIME-INR - Abnormal; Notable for the following:    Prothrombin Time 16.9 (*)    All other components within normal limits  URINE CULTURE  CULTURE, BLOOD (ROUTINE X 2)  CULTURE, BLOOD (ROUTINE X 2)  APTT  URINALYSIS, ROUTINE W REFLEX MICROSCOPIC  I-STAT TROPOININ, ED  I-STAT CG4 LACTIC ACID, ED    Imaging Review Dg Chest 2 View  09/13/2014   CLINICAL DATA:  Fever and weakness, history of pancreatic carcinoma  EXAM: CHEST  2 VIEW  COMPARISON:  04/09/2014  FINDINGS: Cardiac shadow is within normal limits. The lungs are well aerated bilaterally. Mild left basilar atelectasis is seen. A right jugular chest wall port is noted in satisfactory position. Degenerative change of thoracic spine is noted.  IMPRESSION: Minimal left  basilar atelectasis. No other focal abnormality is noted.   Electronically Signed   By: Inez Catalina M.D.   On: 09/13/2014 19:00  EKG Interpretation None      MDM   Final diagnoses:  Fever  Healthcare-associated pneumonia   The patient presents with history of fever today up to 103. Immunocompromise is known pancreatic cancer. The patient has leukocytosis and an infiltrate present on chest x-ray. At this point she will be admitted for further treatment of pneumonia.    Charlesetta Shanks, MD 09/13/14 2042

## 2014-09-14 DIAGNOSIS — E440 Moderate protein-calorie malnutrition: Secondary | ICD-10-CM | POA: Insufficient documentation

## 2014-09-14 DIAGNOSIS — K831 Obstruction of bile duct: Secondary | ICD-10-CM

## 2014-09-14 LAB — CBC WITH DIFFERENTIAL/PLATELET
Basophils Absolute: 0 10*3/uL (ref 0.0–0.1)
Basophils Relative: 0 % (ref 0–1)
EOS PCT: 0 % (ref 0–5)
Eosinophils Absolute: 0 10*3/uL (ref 0.0–0.7)
HCT: 22.2 % — ABNORMAL LOW (ref 36.0–46.0)
Hemoglobin: 7.1 g/dL — ABNORMAL LOW (ref 12.0–15.0)
LYMPHS PCT: 4 % — AB (ref 12–46)
Lymphs Abs: 0.4 10*3/uL — ABNORMAL LOW (ref 0.7–4.0)
MCH: 23.9 pg — ABNORMAL LOW (ref 26.0–34.0)
MCHC: 32 g/dL (ref 30.0–36.0)
MCV: 74.7 fL — ABNORMAL LOW (ref 78.0–100.0)
Monocytes Absolute: 0.8 10*3/uL (ref 0.1–1.0)
Monocytes Relative: 7 % (ref 3–12)
Neutro Abs: 10 10*3/uL — ABNORMAL HIGH (ref 1.7–7.7)
Neutrophils Relative %: 89 % — ABNORMAL HIGH (ref 43–77)
PLATELETS: 289 10*3/uL (ref 150–400)
RBC: 2.97 MIL/uL — ABNORMAL LOW (ref 3.87–5.11)
RDW: 19.5 % — ABNORMAL HIGH (ref 11.5–15.5)
WBC: 11.2 10*3/uL — AB (ref 4.0–10.5)

## 2014-09-14 LAB — BASIC METABOLIC PANEL
ANION GAP: 9 (ref 5–15)
BUN: 11 mg/dL (ref 6–23)
CALCIUM: 8.1 mg/dL — AB (ref 8.4–10.5)
CO2: 26 mEq/L (ref 19–32)
Chloride: 101 mEq/L (ref 96–112)
Creatinine, Ser: 0.58 mg/dL (ref 0.50–1.10)
GFR calc Af Amer: >90 mL/min (ref >90)
GFR calc non Af Amer: >90 mL/min (ref >90)
Glucose, Bld: 161 mg/dL — ABNORMAL HIGH (ref 70–99)
Potassium: 3.9 mEq/L (ref 3.7–5.3)
Sodium: 136 mEq/L — ABNORMAL LOW (ref 137–147)

## 2014-09-14 LAB — GLUCOSE, CAPILLARY
GLUCOSE-CAPILLARY: 63 mg/dL — AB (ref 70–99)
GLUCOSE-CAPILLARY: 121 mg/dL — AB (ref 70–99)
Glucose-Capillary: 135 mg/dL — ABNORMAL HIGH (ref 70–99)
Glucose-Capillary: 126 mg/dL — ABNORMAL HIGH (ref 70–99)

## 2014-09-14 MED ORDER — PIPERACILLIN-TAZOBACTAM 3.375 G IVPB
3.3750 g | Freq: Three times a day (TID) | INTRAVENOUS | Status: DC
  Administered 2014-09-15 – 2014-09-16 (×5): 3.375 g via INTRAVENOUS
  Filled 2014-09-14 (×6): qty 50

## 2014-09-14 MED ORDER — GLUCERNA SHAKE PO LIQD
237.0000 mL | Freq: Two times a day (BID) | ORAL | Status: DC
  Administered 2014-09-15 – 2014-09-19 (×4): 237 mL via ORAL
  Filled 2014-09-14 (×13): qty 237

## 2014-09-14 NOTE — Progress Notes (Signed)
[facility] note Patient is newly admitted to hospice services. She resides at home and is caregiver for an adult son who is disabled. She has a daughter and grandchildren who live in the area. Patient is hearing impaired and does best with discussions in front of her. She also has some forgetfulness/confusion noted by team when she was in her home. Patient desires a return home upon discharge. She shared feeling tired and sleepy. She had her blood sugar checked during visit and it was in 60s, so she drank some juice. LCSW offered support and plan to follow up with her upon return home to assess needs and offer support. Patient has indicated that she understands her dx and poor prognosis. She has deferred code status discussion with team due to not being ready to make that decision. [name] [name], [place]

## 2014-09-14 NOTE — Care Management Note (Signed)
CARE MANAGEMENT NOTE 09/14/2014  Patient:  [name] [name], [name] [name]   Account Number:  [id number]  Date Initiated:  09/14/2014  Documentation initiated by:  Marney Doctor  Subjective/Objective Assessment:   58 yo admitted with PNA.  Hx of stage 4 pancreatic CA     Action/Plan:   From home with son.  Receives [facility] services.   Anticipated DC Date:  09/18/2014   Anticipated DC Plan:  [place]  CM consult      Choice offered to / List presented to:             [facility]   Status of service:  In process, will continue to follow Medicare Important Message given?   (If response is "NO", the following Medicare IM given date fields will be blank) Date Medicare IM given:   Medicare IM given by:   Date Additional Medicare IM given:   Additional Medicare IM given by:    Discharge Disposition:    Per UR Regulation:  Reviewed for med. necessity/level of care/duration of stay  If discussed at [place] of Stay Meetings, dates discussed:    Comments:  09/14/14 Marney Doctor RN,NCM,BSN Please see ED CM note.  CM reviewing chart and will assist with dc needs.

## 2014-09-14 NOTE — Progress Notes (Signed)
Pt refused PM dose of Lantus, Pt educated regarding medication use and reason for administration. Pt continued to refuse.

## 2014-09-14 NOTE — Progress Notes (Signed)
Inpatient RN visit- [place] 3W Room -1331-[facility] RN Visit-[name] RN  Related admission to [facility] diagnosis of Pancreatic Ca.   Pt is Full Code.  Pt seen sitting up in bed alert and oriented, eating breakfast,  able to understand and state why she was in the hospital. Pt sent to ED from home by MD office after hospice RN reported fever. Pt admitted with PNA and is currently being treated with IV abt. Pt reported "some" pain on her right side, per chart review she has roxanol 40 mg Q 3 hrs prn, pt declined pain medication when asked by Probation officer. Pain reported to staff RN [name].  [facility] will continue to follow.  Patient's home medication list in place on shadow chart.  Please call [facility] @ [phone]- ask for RN Liaison or after hours,ask for on-call RN with any hospice needs.   Thank you. [name], RN  [facility]  Hospice Liaison  [phone])

## 2014-09-14 NOTE — Progress Notes (Signed)
INITIAL NUTRITION ASSESSMENT  DOCUMENTATION CODES Per approved criteria  -Non-severe (moderate) malnutrition in the context of chronic illness  Pt meets criteria for moderate MALNUTRITION in the context of chronic illness as evidenced by moderate muscle depletion and 16% weight loss x 5 months.  INTERVENTION:  Provide Glucerna Shake po BID, each supplement provides 220 kcal and 10 grams of protein  Afternoon snack daily  Pt will need assistance in meal ordering  Encourage PO intake  RD to continue to monitor  NUTRITION DIAGNOSIS: Increased nutrient (protein) needs related to advanced cancer as evidenced by 16% wt loss x 5 months.   Goal: Pt to meet >/= 90% of their estimated nutrition needs  Monitor:  PO and supplemental intake, weight, labs, I/O's  Reason for Assessment: Pt identified as at nutrition risk on the Malnutrition Screen Tool  Admitting Dx: Pneumonia  ASSESSMENT: 58 yo female h/o stage 4 pancreatic cancer with several days of cough, fever of 103 at home, and sob. She says she is "taking a break" from chemotherapy for now.  Pt reports recent wt loss, pt has steadily been losing weight since February 2015. Per weight history documentation, pt weighed 141 lb in May, currently weighs 118 lb (16% wt loss x 5 months, this is significant for time frame). Previously has received chemotherapy and radiation for treatment of pancreatic cancer. No longer receiving chemotherapy at this time.  Pt reports good appetite now. PTA pt wasn't eating that well d/t not feeling like cooking for herself. Pt lives with son but he does not assist with meals. Pt states that she will fix tv dinners, pies for herself and likes pudding and fruit.  Encouraged pt to consume small frequent meals and snacks with protein.  During visit pt requested for lunch to be ordered for her d/t pt continues to fall asleep before being able to use ordering service. RD ordered lunch and an afternoon snack for  pt. Also, ordering assistance added to diet order.  Pt would like to receive Glucerna supplements, RD to order BID.  Nutrition Focused Physical Exam:  Subcutaneous Fat:  Orbital Region: mild depletion Upper Arm Region: NA Thoracic and Lumbar Region: NA  Muscle:  Temple Region: moderate depletion Clavicle Bone Region: moderate depletion Clavicle and Acromion Bone Region: moderate depletion Scapular Bone Region: moderate depletion Dorsal Hand: mild depletion Patellar Region: NA Anterior Thigh Region: NA Posterior Calf Region: NA  Edema: no LE edema   Labs reviewed: Glucose 161  Height: Ht Readings from Last 1 Encounters:  09/13/14 5\' 2"  (1.575 m)    Weight: Wt Readings from Last 1 Encounters:  09/13/14 118 lb (53.524 kg)    Ideal Body Weight: 110 lb  % Ideal Body Weight: 107%  Wt Readings from Last 10 Encounters:  09/13/14 118 lb (53.524 kg)  09/02/14 118 lb 14.4 oz (53.933 kg)  08/05/14 123 lb 9.6 oz (56.065 kg)  06/20/14 128 lb 4.8 oz (58.196 kg)  06/13/14 128 lb (58.06 kg)  06/06/14 134 lb 14.4 oz (61.19 kg)  05/17/14 139 lb 3.2 oz (63.141 kg)  04/20/14 138 lb 3.2 oz (62.687 kg)  04/09/14 141 lb 9.6 oz (64.229 kg)  04/08/14 137 lb 8 oz (62.37 kg)    Usual Body Weight: 150 lb  % Usual Body Weight: 79%  BMI:  Body mass index is 21.58 kg/(m^2).  Estimated Nutritional Needs: Kcal: 1600-1800 Protein: 80-90g Fluid: 1.6L/day  Skin: intact  Diet Order: Carb Control  EDUCATION NEEDS: -No education needs identified at this  time   Intake/Output Summary (Last 24 hours) at 09/14/14 0954 Last data filed at 09/14/14 0600  Gross per 24 hour  Intake 860.42 ml  Output      0 ml  Net 860.42 ml    Last BM: 10/27  Labs:   Recent Labs Lab 09/13/14 1857 09/14/14 0502  NA 137 136*  K 4.0 3.9  CL 100 101  CO2 23 26  BUN 12 11  CREATININE 0.74 0.58  CALCIUM 8.4 8.1*  GLUCOSE 146* 161*    CBG (last 3)   Recent Labs  09/14/14 0759  GLUCAP  135*    Scheduled Meds: . aspirin EC  81 mg Oral Daily  . ceFEPime (MAXIPIME) IV  1 g Intravenous Q8H  . gabapentin  300 mg Oral TID  . insulin aspart  10 Units Subcutaneous TID WC  . insulin glargine  30 Units Subcutaneous QHS  . lipase/protease/amylase  12,000 Units Oral TID AC  . metoCLOPramide  5 mg Oral TID AC  . mirtazapine  15 mg Oral QHS  . pantoprazole  40 mg Oral Daily  . potassium chloride  10 mEq Oral Daily  . simvastatin  10 mg Oral QHS  . sodium chloride  3 mL Intravenous Q12H  . sucralfate  1 g Oral BID  . vancomycin  750 mg Intravenous Q12H  . [DISCONTINUED] sodium chloride   Intravenous STAT    Continuous Infusions:   Past Medical History  Diagnosis Date  . DM (diabetes mellitus), type 2     requires insulin  . Hypertension   . Depression   . Anxiety   . Hyperlipidemia   . Pancreatitis   . GERD (gastroesophageal reflux disease)   . Pancreatic cancer 05/2013    adenocarcinoma on ERCP/FNA  . Hearing impaired     lost the last of her hearing aids and cn not get another one.  hearing is better via right ear.   Marland Kitchen History of radiation therapy 07/20/13-08/09/13    Pancreas 37.5Gy  . DDD (degenerative disc disease), lumbar   . Chronic back pain   . Stroke   . Anemia   . Ulcer 01/2014    radiation gastroduodenitis with duodenal ulcer.     Past Surgical History  Procedure Laterality Date  . Appendectomy    . Ercp N/A 05/24/2013    Procedure: ENDOSCOPIC RETROGRADE CHOLANGIOPANCREATOGRAPHY (ERCP);  Surgeon: Milus Banister, MD;  Location: [place];  Service: Endoscopy;  Laterality: N/A;  MAC vs. general per anesthesia   . Tubal ligation    . Endoscopic retrograde cholangiopancreatography (ercp) with propofol N/A 10/21/2013    Procedure: ENDOSCOPIC RETROGRADE CHOLANGIOPANCREATOGRAPHY (ERCP) WITH PROPOFOL;  Surgeon: Milus Banister, MD;  Location: [facility];  Service: Endoscopy;  Laterality: N/A;  Stent removal   . Biliary stent placement N/A 10/21/2013     Procedure: BILIARY STENT PLACEMENT;  Surgeon: Milus Banister, MD;  Location: [facility];  Service: Endoscopy;  Laterality: N/A;  . Tee without cardioversion N/A 12/29/2013    Procedure: TRANSESOPHAGEAL ECHOCARDIOGRAM (TEE);  Surgeon: Dorothy Spark, MD;  Location: [place];  Service: Cardiovascular;  Laterality: N/A;  . Eus N/A 02/10/2014    Procedure: UPPER ENDOSCOPIC ULTRASOUND (EUS) LINEAR;  Surgeon: Milus Banister, MD;  Location: [facility];  Service: Endoscopy;  Laterality: N/A;  celiac plexus neurolysis  . Bile duct stent placement      Clayton Bibles, MS, RD, LDN Pager: [phone] After Hours Pager: [phone]

## 2014-09-14 NOTE — Progress Notes (Addendum)
CRITICAL VALUE ALERT  Critical value received:  Positive blood cultures; 2 bottles - 1 anaerobic, 1 aerobic - both growing gram negative rods  Date of notification:  09/14/2014  Time of notification:  1910  Critical value read back:Yes.    Nurse who received alert:  Aldean Baker, RN  MD notified (1st page):  Schorr  Time of first page:  1915  MD notified (2nd page): Schorr  Time of second page: 1945  Responding MD:  Schorr  Time MD responded:  2000, new orders received.

## 2014-09-14 NOTE — Progress Notes (Signed)
[facility] PROGRESS NOTE  [name] [name] KGM:[id number] DOB: Jan 03, 1956 DOA: 09/13/2014 PCP: Philis Fendt, MD  Assessment/Plan: 1. HCAP 1. Cont on vanc and cefepime 2. Will order flutter valve for secretions 3. Presenting leukocytosis improving 4. Consider transition to po abx as pt continues to improve 2. HTN 1. BP stable and controlled 2. Cont monitor 3. Pancreatic cancer 1. Followed by oncology 4. HLD 1. On statin 5. DVT prophylaxis 1. SCD's  Code Status: Full Family Communication: Pt in room (indicate person spoken with, relationship, and if by phone, the number) Disposition Plan: Pending   Consultants:    Procedures:    Antibiotics:  Vanc 10/27>>>  Cefepime 10/27>>>   HPI/Subjective: No acute events noted.   Objective: Filed Vitals:   09/13/14 2058 09/13/14 2204 09/14/14 0718 09/14/14 1606  BP: 113/60 136/62 112/64 126/74  Pulse: 88 85 89 99  Temp: 98 F (36.7 C) 98.3 F (36.8 C) 98.6 F (37 C) 99.3 F (37.4 C)  TempSrc: Oral Oral Oral Oral  Resp: 18 18 18 20   Height:      Weight:      SpO2: 100% 100% 100% 98%    Intake/Output Summary (Last 24 hours) at 09/14/14 1815 Last data filed at 09/14/14 1450  Gross per 24 hour  Intake 3010.42 ml  Output      0 ml  Net 3010.42 ml   Filed Weights   09/13/14 2024  Weight: 53.524 kg (118 lb)    Exam:   General:  Awake, in nad  Cardiovascular: regular, s1, s2  Respiratory: coarse, normal resp effort, no wheezing  Abdomen: distended, nontender, pos bs  Musculoskeletal: perfused, no clubbing   Data Reviewed: Basic Metabolic Panel:  Recent Labs Lab 09/13/14 1857 09/14/14 0502  NA 137 136*  K 4.0 3.9  CL 100 101  CO2 23 26  GLUCOSE 146* 161*  BUN 12 11  CREATININE 0.74 0.58  CALCIUM 8.4 8.1*   Liver Function Tests:  Recent Labs Lab 09/13/14 1857  AST 66*  ALT 20  ALKPHOS 664*  BILITOT 2.1*  PROT 7.0  ALBUMIN 2.4*    Recent Labs Lab 09/13/14 1857  LIPASE 4*    No results found for this basename: AMMONIA,  in the last 168 hours CBC:  Recent Labs Lab 09/13/14 1857 09/14/14 0502  WBC 19.3* 11.2*  NEUTROABS 17.7* 10.0*  HGB 8.1* 7.1*  HCT 25.3* 22.2*  MCV 74.9* 74.7*  PLT 358 289   Cardiac Enzymes: No results found for this basename: CKTOTAL, CKMB, CKMBINDEX, TROPONINI,  in the last 168 hours BNP (last 3 results) No results found for this basename: PROBNP,  in the last 8760 hours CBG:  Recent Labs Lab 09/14/14 0759 09/14/14 1337 09/14/14 1721  GLUCAP 135* 63* 121*    Recent Results (from the past 240 hour(s))  CULTURE, BLOOD (ROUTINE X 2)     Status: None   Collection Time    09/13/14  6:58 PM      Result Value Ref Range Status   Specimen Description BLOOD RIGHT ANTECUBITAL   Final   Special Requests BOTTLES DRAWN AEROBIC AND ANAEROBIC 5ML   Final   Culture  Setup Time     Final   Value: 09/13/2014 22:33     Performed at [facility]   Culture     Final   Value:        BLOOD CULTURE RECEIVED NO GROWTH TO DATE CULTURE WILL BE HELD FOR 5 DAYS BEFORE ISSUING A  FINAL NEGATIVE REPORT     Performed at [facility]   Report Status PENDING   Incomplete  CULTURE, BLOOD (ROUTINE X 2)     Status: None   Collection Time    09/13/14  6:59 PM      Result Value Ref Range Status   Specimen Description BLOOD RIGHT PORT   Final   Special Requests BOTTLES DRAWN AEROBIC AND ANAEROBIC 5ML   Final   Culture  Setup Time     Final   Value: 09/13/2014 22:34     Performed at [facility]   Culture     Final   Value:        BLOOD CULTURE RECEIVED NO GROWTH TO DATE CULTURE WILL BE HELD FOR 5 DAYS BEFORE ISSUING A FINAL NEGATIVE REPORT     Performed at [facility]   Report Status PENDING   Incomplete     Studies: Dg Chest 2 View  09/13/2014   CLINICAL DATA:  Fever and weakness, history of pancreatic carcinoma  EXAM: CHEST  2 VIEW  COMPARISON:  04/09/2014  FINDINGS: Cardiac shadow is within normal limits. The  lungs are well aerated bilaterally. Mild left basilar atelectasis is seen. A right jugular chest wall port is noted in satisfactory position. Degenerative change of thoracic spine is noted.  IMPRESSION: Minimal left basilar atelectasis. No other focal abnormality is noted.   Electronically Signed   By: Inez Catalina M.D.   On: 09/13/2014 19:00    Scheduled Meds: . aspirin EC  81 mg Oral Daily  . ceFEPime (MAXIPIME) IV  1 g Intravenous Q8H  . feeding supplement (GLUCERNA SHAKE)  237 mL Oral BID PC  . gabapentin  300 mg Oral TID  . insulin aspart  10 Units Subcutaneous TID WC  . insulin glargine  30 Units Subcutaneous QHS  . lipase/protease/amylase  12,000 Units Oral TID AC  . metoCLOPramide  5 mg Oral TID AC  . mirtazapine  15 mg Oral QHS  . pantoprazole  40 mg Oral Daily  . potassium chloride  10 mEq Oral Daily  . simvastatin  10 mg Oral QHS  . sodium chloride  3 mL Intravenous Q12H  . sucralfate  1 g Oral BID  . vancomycin  750 mg Intravenous Q12H   Continuous Infusions:   Principal Problem:   Pneumonia Active Problems:   HTN (hypertension)   Dyslipidemia   Pancreatic cancer   Malnutrition of moderate degree  Time spent: 96min  Sylver Vantassell, [place] [facility] Pager [phone]. If 7PM-7AM, please contact night-coverage at [email or web address], password [facility] 09/14/2014, 6:15 PM  LOS: 1 day

## 2014-09-14 NOTE — Progress Notes (Signed)
ANTIBIOTIC CONSULT NOTE - INITIAL  Pharmacy Consult for Zosyn Indication: GNR bacteremia  Allergies  Allergen Reactions  . Betadine [Povidone Iodine] Itching and Rash    Patient Measurements: Height: 5\' 2"  (157.5 cm) Weight: 118 lb (53.524 kg) IBW/kg (Calculated) : 50.1  Vital Signs: Temp: 99.3 F (37.4 C) (10/28 1606) Temp Source: Oral (10/28 1606) BP: 126/74 mmHg (10/28 1606) Pulse Rate: 99 (10/28 1606) Intake/Output from previous day: 10/27 0701 - 10/28 0700 In: 860.4 [I.V.:810.4; IV Piggyback:50] Out: -  Intake/Output from this shift:    Labs:  Recent Labs  09/13/14 1857 09/14/14 0502  WBC 19.3* 11.2*  HGB 8.1* 7.1*  PLT 358 289  CREATININE 0.74 0.58   Estimated Creatinine Clearance: 59.1 ml/min (by C-G formula based on Cr of 0.58). No results found for this basename: VANCOTROUGH, Corlis Leak, VANCORANDOM, [place], GENTPEAK, GENTRANDOM, TOBRATROUGH, TOBRAPEAK, TOBRARND, AMIKACINPEAK, AMIKACINTROU, AMIKACIN,  in the last 72 hours   Microbiology: Recent Results (from the past 720 hour(s))  CULTURE, BLOOD (ROUTINE X 2)     Status: None   Collection Time    09/13/14  6:58 PM      Result Value Ref Range Status   Specimen Description BLOOD RIGHT ANTECUBITAL   Final   Special Requests BOTTLES DRAWN AEROBIC AND ANAEROBIC 5ML   Final   Culture  Setup Time     Final   Value: 09/13/2014 22:33     Performed at [facility]   Culture     Final   Value: GRAM NEGATIVE RODS     Note: Gram Stain Report Called to,Read Back By and Verified With: REGINA BALDWIN RN 48P     Performed at [facility]   Report Status PENDING   Incomplete  CULTURE, BLOOD (ROUTINE X 2)     Status: None   Collection Time    09/13/14  6:59 PM      Result Value Ref Range Status   Specimen Description BLOOD RIGHT PORT   Final   Special Requests BOTTLES DRAWN AEROBIC AND ANAEROBIC 5ML   Final   Culture  Setup Time     Final   Value: 09/13/2014 22:34     Performed at [facility]   Culture     Final   Value: GRAM NEGATIVE RODS     Note: Gram Stain Report Called to,Read Back By and Verified With: Aldean Baker RN [phone]     Performed at [facility]   Report Status PENDING   Incomplete    Medical History: Past Medical History  Diagnosis Date  . DM (diabetes mellitus), type 2     requires insulin  . Hypertension   . Depression   . Anxiety   . Hyperlipidemia   . Pancreatitis   . GERD (gastroesophageal reflux disease)   . Pancreatic cancer 05/2013    adenocarcinoma on ERCP/FNA  . Hearing impaired     lost the last of her hearing aids and cn not get another one.  hearing is better via right ear.   Marland Kitchen History of radiation therapy 07/20/13-08/09/13    Pancreas 37.5Gy  . DDD (degenerative disc disease), lumbar   . Chronic back pain   . Stroke   . Anemia   . Ulcer 01/2014    radiation gastroduodenitis with duodenal ulcer.      Assessment: 67 yoF with end-stage pancreatic cancer s/p recent chemotherapy (Abraxane/Gemcitabine completed 08/23/14, further chemo discontinued) presents with productive cough and fever.  Temperature of 103 taken  PTA by home health nurse.  Pharmacy consulted to dose vancomycin.  Cefepime started by MD.  This evening (10/28), blood cultures resulting 2/2 GNR.  NP ordered to start Zosyn per pharmacy in place of Cefepime.  10/27 >> Vanc >> 10/27 >> Cefepime >> 10/28 10/28 >> Zosyn >>  Tmax: 103 (PTA), currently afebrile, no APAP used WBC: improved to 11.2 Renal: SCr 0.58, CrCl~60 ml/min CG (using SCr 0.8)   Goal of Therapy:  Vancomycin trough level 15-20 mcg/ml Doses adjusted per renal function Eradication of infection  Plan:  1.  Start Zosyn 3.375g IV q8h (4 hour infusion time).  2.  F/u blood culture ID and sensitivities.  Hershal Coria, PharmD, BCPS Pager: [phone] 09/14/2014 8:37 PM

## 2014-09-15 DIAGNOSIS — N390 Urinary tract infection, site not specified: Secondary | ICD-10-CM

## 2014-09-15 DIAGNOSIS — A419 Sepsis, unspecified organism: Secondary | ICD-10-CM

## 2014-09-15 DIAGNOSIS — R109 Unspecified abdominal pain: Secondary | ICD-10-CM

## 2014-09-15 DIAGNOSIS — B9689 Other specified bacterial agents as the cause of diseases classified elsewhere: Secondary | ICD-10-CM

## 2014-09-15 DIAGNOSIS — R7881 Bacteremia: Secondary | ICD-10-CM

## 2014-09-15 DIAGNOSIS — R509 Fever, unspecified: Secondary | ICD-10-CM

## 2014-09-15 DIAGNOSIS — E876 Hypokalemia: Secondary | ICD-10-CM

## 2014-09-15 DIAGNOSIS — F329 Major depressive disorder, single episode, unspecified: Secondary | ICD-10-CM

## 2014-09-15 DIAGNOSIS — C250 Malignant neoplasm of head of pancreas: Secondary | ICD-10-CM

## 2014-09-15 DIAGNOSIS — R630 Anorexia: Secondary | ICD-10-CM

## 2014-09-15 DIAGNOSIS — R05 Cough: Secondary | ICD-10-CM

## 2014-09-15 LAB — BASIC METABOLIC PANEL
Anion gap: 8 (ref 5–15)
BUN: 14 mg/dL (ref 6–23)
CALCIUM: 8 mg/dL — AB (ref 8.4–10.5)
CHLORIDE: 99 meq/L (ref 96–112)
CO2: 25 meq/L (ref 19–32)
CREATININE: 0.62 mg/dL (ref 0.50–1.10)
GFR calc Af Amer: >90 mL/min (ref >90)
GFR calc non Af Amer: >90 mL/min (ref >90)
Glucose, Bld: 190 mg/dL — ABNORMAL HIGH (ref 70–99)
Potassium: 3.8 mEq/L (ref 3.7–5.3)
Sodium: 132 mEq/L — ABNORMAL LOW (ref 137–147)

## 2014-09-15 LAB — CBC
HEMATOCRIT: 23.8 % — AB (ref 36.0–46.0)
Hemoglobin: 7.3 g/dL — ABNORMAL LOW (ref 12.0–15.0)
MCH: 23.5 pg — AB (ref 26.0–34.0)
MCHC: 30.7 g/dL (ref 30.0–36.0)
MCV: 76.5 fL — AB (ref 78.0–100.0)
Platelets: 281 10*3/uL (ref 150–400)
RBC: 3.11 MIL/uL — ABNORMAL LOW (ref 3.87–5.11)
RDW: 19.6 % — ABNORMAL HIGH (ref 11.5–15.5)
WBC: 8.9 10*3/uL (ref 4.0–10.5)

## 2014-09-15 LAB — URINE CULTURE
COLONY COUNT: NO GROWTH
Culture: NO GROWTH

## 2014-09-15 LAB — GLUCOSE, CAPILLARY
GLUCOSE-CAPILLARY: 176 mg/dL — AB (ref 70–99)
GLUCOSE-CAPILLARY: 162 mg/dL — AB (ref 70–99)
GLUCOSE-CAPILLARY: 175 mg/dL — AB (ref 70–99)
Glucose-Capillary: 108 mg/dL — ABNORMAL HIGH (ref 70–99)

## 2014-09-15 LAB — CLOSTRIDIUM DIFFICILE BY PCR: Toxigenic C. Difficile by PCR: NEGATIVE

## 2014-09-15 MED ORDER — INSULIN ASPART 100 UNIT/ML ~~LOC~~ SOLN
0.0000 [IU] | Freq: Every day | SUBCUTANEOUS | Status: DC
Start: 2014-09-15 — End: 2014-09-20
  Administered 2014-09-17: 2 [IU] via SUBCUTANEOUS

## 2014-09-15 MED ORDER — INSULIN ASPART 100 UNIT/ML ~~LOC~~ SOLN
0.0000 [IU] | Freq: Three times a day (TID) | SUBCUTANEOUS | Status: DC
  Administered 2014-09-15 – 2014-09-19 (×3): 3 [IU] via SUBCUTANEOUS

## 2014-09-15 NOTE — Progress Notes (Signed)
Pt refused all bedtime medications, states "none of those pills are going to fix me". Pt educated. Pt continued to refuse.

## 2014-09-15 NOTE — Progress Notes (Signed)
CSW received referral for [facility].   CSW reviewed chart and noted that Dr. Benay Spice recommends [facility] but oncologist nor attending MD have discussed with pt daughter regarding prognosis and disposition plan.   CSW visited pt room and pt sleeping at this time. RN reports that pt has been sleeping all day and slept all day yesterday.   CSW discussed with [facility] RN liaison, Flo Shanks. CSW left message for [facility] SW, Katherina Right to inquire about [facility] referral.  CSW to complete full psychosocial assessment when MD have clarified disposition and prognosis with pt daughter and receive further information from Section to continue to follow.  Alison Murray, MSW, [place] Work [phone]

## 2014-09-15 NOTE — Clinical Documentation Improvement (Signed)
Abnormal Lab and/or Testing Results: 09/13/14: Abnormal urine micro and abnormal urinalysis.   Treatment provided: 09/14/14: Pharmacy consult for GNR bacteremia (blood cultures); zosyn started.   Possible Clinical Conditions: UTI Other Unable to determine    [facility] Documentation Specialist [phone] [email or web address]

## 2014-09-15 NOTE — Progress Notes (Addendum)
IP PROGRESS NOTE  Subjective:   She was admitted 09/13/2014 with a fever and cough. [name] [name] continues to have a cough. She has abdominal pain. She does not appear aware of the reason for hospital admission. She is now afebrile. Blood cultures have returned positive for gram-negative rods.  Objective: Vital signs in last 24 hours: Blood pressure 119/64, pulse 84, temperature 99.1 F (37.3 C), temperature source Oral, resp. rate 18, height 5\' 2"  (1.575 m), weight 118 lb (53.524 kg), SpO2 100.00%.  Intake/Output from previous day: 10/28 0701 - 10/29 0700 In: 2350 [P.O.:200; I.V.:2150] Out: -   Physical Exam:  HEENT: No thrush Lungs: Rhonchi at the lower posterior chest bilaterally Cardiac: Regular rate and rhythm Abdomen: Mildly distended, no hepatomegaly, no mass Extremities: No leg edema   Portacath/PICC-without erythema  Lab Results:  Recent Labs  09/14/14 0502 09/15/14 0525  WBC 11.2* 8.9  HGB 7.1* 7.3*  HCT 22.2* 23.8*  PLT 289 281    BMET  Recent Labs  09/14/14 0502 09/15/14 0525  NA 136* 132*  K 3.9 3.8  CL 101 99  CO2 26 25  GLUCOSE 161* 190*  BUN 11 14  CREATININE 0.58 0.62  CALCIUM 8.1* 8.0*    Studies/Results: Dg Chest 2 View  09/13/2014   CLINICAL DATA:  Fever and weakness, history of pancreatic carcinoma  EXAM: CHEST  2 VIEW  COMPARISON:  04/09/2014  FINDINGS: Cardiac shadow is within normal limits. The lungs are well aerated bilaterally. Mild left basilar atelectasis is seen. A right jugular chest wall port is noted in satisfactory position. Degenerative change of thoracic spine is noted.  IMPRESSION: Minimal left basilar atelectasis. No other focal abnormality is noted.   Electronically Signed   By: Inez Catalina M.D.   On: 09/13/2014 19:00    Medications: I have reviewed the patient's current medications.  Assessment/Plan:  1. Adenocarcinoma of the pancreas presenting with an obstructing pancreas head mass, clinical stage II versus III  (T3-T4, N1); potential abutment of the celiac axis noted on the MRI abdomen 05/22/2013.  Initiation of radiation and Xeloda 07/20/2013 with completion 08/09/2013.  CT scans abdomen/pelvis done 11/01/2013 for evaluation of increased abdominal pain showed stable infiltrating pancreatic mass. No progression identified. Stable peripancreatic lymph nodes. 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 No evidence of metastatic disease.  Pancreatic head mass stable on CT 01/03/2014 and 03/07/2014.  CA 19-9 736 on 05/24/2014.  Initiation of gemcitabine/Abraxane on a 2 week schedule 05/24/2014.  CA 19-9 improved on 07/06/2014.  CT scan 08/02/2014 (comparison scan 03/07/2014) showed a new hypervascular mass measuring 5 cm by 6 cm in the right hepatic lobe. 2. Obstructive jaundice secondary to #1. Status post placement of a bile duct stent on 05/24/2013. Status post placement of a metal stent 10/21/2013. 3. Pain -likely secondary to pancreas cancer, no improvement following the celiac plexus nerve block 02/10/2014. She continues Oxycodone and hydrocodone. 4. Diabetes. 5. History of chronic pancreatitis. 6. History of alcohol use. 7. History of tobacco use. 8. Anxiety/depression. 9. History of a Cutaneous nodule at the left flank. Question cyst, question metastasis. 10. Abnormal EKG, question Brugada syndrome. 11. Hospitalization 08/12/2013 through 08/15/2013 with nausea/vomiting and abdominal pain. Improved. 12. 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 Portal vein patent. New wall thickening of the mid to distal stomach and the hepatic flexure and proximal transverse  colon. 13. 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 She discontinued Lovenox due to abdominal wall  ecchymoses and hematomas. 14. Admission 12/22/2013 with strep viridans bacteremia status post 4 week course of ceftriaxone.  15. Status post celiac plexus nerve block 02/10/2014.  16. 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 She started Pancrease after an office visit 02/07/2014. She continues to have intermittent diarrhea. She does not take the pancreatic enzyme replacement consistently. 17. Elevated liver enzymes. Possibly related to a biliary stent obstruction 18. Admission 04/09/2014 with Escherichia coli and fungal bacteremia, completing outpatient ceftriaxone and micafungin. 19. Hypokalemia. She is on a potassium supplement. 20. Status post Port-A-Cath placement 06/14/2014. 21. Anorexia, depression. Remeron initiated 06/20/2014. It is not clear that she is taking Remeron. 68. Fever/gram-negative bacteremia-most likely secondary to a biliary infection   [name] has metastatic pancreas cancer. She was admitted from home with a fever and gram-negative bacteremia. She appears to have limited understanding of the diagnosis and current situation. She is not a candidate for further chemotherapy and is enrolled in hospice care. I think it will become increasingly difficult for her to be managed in the home. We have discussed [facility] in the past. I discussed this with her again today. Her daughter is not present this morning. The fever and a gram-negative bacteremia are most likely related to a biliary tract infection in the setting of an indwelling stent.  Recommendations: 1. Continue antibiotics per the medicine service, GI consultation to evaluate the biliary stent 2. continue Roxanol and oxycodone for pain 3. consult care management to meet with daughter for discussion of the home situation and need for [facility] 4. consolidate medications for comfort care 5. followup as scheduled at the [facility] 09/20/2014, I will follow her at [facility] if she is  transferred there  I will be glad to discuss the prognosis and disposition plan with her daughter. Please call oncology as needed.    LOS: 2 days   Patty Lopezgarcia  09/15/2014, 1:11 PM

## 2014-09-15 NOTE — Progress Notes (Signed)
[facility] PROGRESS NOTE  [name] [name] ZOX:[id number] DOB: 1956-05-13 DOA: 09/13/2014 PCP: Philis Fendt, MD  Assessment/Plan: 1. HCAP with UTI 1. Cont on vanc and cefepime 2. Will order flutter valve for secretions 3. Presenting leukocytosis resolved 4. Consider transition to po abx when more awake to tolerate PO meds 2. HTN 1. BP stable and controlled 2. Cont monitor 3. DM 1. Pt not tolerating much PO 2. Will hold pre-meal insulin 3. Cont lantus 4. Add SSI 4. Pancreatic cancer 1. Followed by oncology 5. HLD 1. On statin 6. DVT prophylaxis 1. SCD's  Code Status: Full Family Communication: Pt in room Disposition Plan: Pending   Consultants:    Procedures:    Antibiotics:  Vanc 10/27>>>  Cefepime 10/27>>>   HPI/Subjective: Feels tired and weak. No acute events noted overnight  Objective: Filed Vitals:   09/14/14 0718 09/14/14 1606 09/14/14 2145 09/15/14 0517  BP: 112/64 126/74 130/68 119/64  Pulse: 89 99 91 84  Temp: 98.6 F (37 C) 99.3 F (37.4 C) 99.1 F (37.3 C) 99.1 F (37.3 C)  TempSrc: Oral Oral Oral Oral  Resp: 18 20 20 18   Height:      Weight:      SpO2: 100% 98% 97% 100%    Intake/Output Summary (Last 24 hours) at 09/15/14 1309 Last data filed at 09/15/14 0517  Gross per 24 hour  Intake   2350 ml  Output      0 ml  Net   2350 ml   Filed Weights   09/13/14 2024  Weight: 53.524 kg (118 lb)    Exam:   General:  Awake, in nad  Cardiovascular: regular, s1, s2  Respiratory: coarse, normal resp effort, no wheezing  Abdomen: distended, nontender, pos bs  Musculoskeletal: perfused, no clubbing   Data Reviewed: Basic Metabolic Panel:  Recent Labs Lab 09/13/14 1857 09/14/14 0502 09/15/14 0525  NA 137 136* 132*  K 4.0 3.9 3.8  CL 100 101 99  CO2 23 26 25   GLUCOSE 146* 161* 190*  BUN 12 11 14   CREATININE 0.74 0.58 0.62  CALCIUM 8.4 8.1* 8.0*   Liver Function Tests:  Recent Labs Lab 09/13/14 1857  AST 66*   ALT 20  ALKPHOS 664*  BILITOT 2.1*  PROT 7.0  ALBUMIN 2.4*    Recent Labs Lab 09/13/14 1857  LIPASE 4*   No results found for this basename: AMMONIA,  in the last 168 hours CBC:  Recent Labs Lab 09/13/14 1857 09/14/14 0502 09/15/14 0525  WBC 19.3* 11.2* 8.9  NEUTROABS 17.7* 10.0*  --   HGB 8.1* 7.1* 7.3*  HCT 25.3* 22.2* 23.8*  MCV 74.9* 74.7* 76.5*  PLT 358 289 281   Cardiac Enzymes: No results found for this basename: CKTOTAL, CKMB, CKMBINDEX, TROPONINI,  in the last 168 hours BNP (last 3 results) No results found for this basename: PROBNP,  in the last 8760 hours CBG:  Recent Labs Lab 09/14/14 1337 09/14/14 1721 09/14/14 2143 09/15/14 0806 09/15/14 1222  GLUCAP 63* 121* 126* 176* 162*    Recent Results (from the past 240 hour(s))  CULTURE, BLOOD (ROUTINE X 2)     Status: None   Collection Time    09/13/14  6:58 PM      Result Value Ref Range Status   Specimen Description BLOOD RIGHT ANTECUBITAL   Final   Special Requests BOTTLES DRAWN AEROBIC AND ANAEROBIC 5ML   Final   Culture  Setup Time     Final   Value:  09/13/2014 22:33     Performed at [facility]   Culture     Final   Value: GRAM NEGATIVE RODS     Note: Gram Stain Report Called to,Read Back By and Verified With: REGINA BALDWIN RN [phone]     Performed at [facility]   Report Status PENDING   Incomplete  CULTURE, BLOOD (ROUTINE X 2)     Status: None   Collection Time    09/13/14  6:59 PM      Result Value Ref Range Status   Specimen Description BLOOD RIGHT PORT   Final   Special Requests BOTTLES DRAWN AEROBIC AND ANAEROBIC 5ML   Final   Culture  Setup Time     Final   Value: 09/13/2014 22:34     Performed at [facility]   Culture     Final   Value: GRAM NEGATIVE RODS     Note: Gram Stain Report Called to,Read Back By and Verified With: Aldean Baker RN [phone]     Performed at [facility]   Report Status PENDING   Incomplete  URINE CULTURE     Status:  None   Collection Time    09/13/14  8:46 PM      Result Value Ref Range Status   Specimen Description URINE, CLEAN CATCH   Final   Special Requests NONE   Final   Culture  Setup Time     Final   Value: 09/14/2014 01:45     Performed at [place]     Final   Value: NO GROWTH     Performed at [facility]   Culture     Final   Value: NO GROWTH     Performed at [facility]   Report Status 09/15/2014 FINAL   Final     Studies: Dg Chest 2 View  09/13/2014   CLINICAL DATA:  Fever and weakness, history of pancreatic carcinoma  EXAM: CHEST  2 VIEW  COMPARISON:  04/09/2014  FINDINGS: Cardiac shadow is within normal limits. The lungs are well aerated bilaterally. Mild left basilar atelectasis is seen. A right jugular chest wall port is noted in satisfactory position. Degenerative change of thoracic spine is noted.  IMPRESSION: Minimal left basilar atelectasis. No other focal abnormality is noted.   Electronically Signed   By: Inez Catalina M.D.   On: 09/13/2014 19:00    Scheduled Meds: . aspirin EC  81 mg Oral Daily  . feeding supplement (GLUCERNA SHAKE)  237 mL Oral BID PC  . gabapentin  300 mg Oral TID  . insulin aspart  0-15 Units Subcutaneous TID WC  . insulin aspart  0-5 Units Subcutaneous QHS  . insulin glargine  30 Units Subcutaneous QHS  . lipase/protease/amylase  12,000 Units Oral TID AC  . metoCLOPramide  5 mg Oral TID AC  . mirtazapine  15 mg Oral QHS  . pantoprazole  40 mg Oral Daily  . piperacillin-tazobactam (ZOSYN)  IV  3.375 g Intravenous Q8H  . potassium chloride  10 mEq Oral Daily  . simvastatin  10 mg Oral QHS  . sodium chloride  3 mL Intravenous Q12H  . sucralfate  1 g Oral BID  . vancomycin  750 mg Intravenous Q12H   Continuous Infusions:   Principal Problem:   Pneumonia Active Problems:   HTN (hypertension)   Dyslipidemia   Pancreatic cancer   Malnutrition of moderate degree  Time spent:  66min  [facility], [place] [facility] Pager [phone]. If 7PM-7AM, please contact night-coverage at [email or web address], password [facility] 09/15/2014, 1:09 PM  LOS: 2 days

## 2014-09-15 NOTE — Progress Notes (Signed)
Inpatient Diabetes Program Recommendations  AACE/ADA: New Consensus Statement on Inpatient Glycemic Control (2013)  Target Ranges:  Prepandial:   less than 140 mg/dL      Peak postprandial:   less than 180 mg/dL (1-2 hours)      Critically ill patients:  140 - 180 mg/dL   Reason for Visit: Hypoglycemia  Diabetes history: DM2 Outpatient Diabetes medications: Lantus 30 QHS and Novalog 10 units tid. Current orders for Inpatient glycemic control: same  58 yo female h/o stage 4 pancreatic cancer with several days of cough, fever of 103 at home, and sob. Hx DM2.  Results for KAYDRA, BORGEN (MRN [id number]) as of 09/15/2014 10:34  Ref. Range 09/14/2014 07:59 09/14/2014 13:37 09/14/2014 17:21 09/14/2014 21:43 09/15/2014 08:06  Glucose-Capillary Latest Range: 70-99 mg/dL 135 (H) 63 (L) 121 (H) 126 (H) 176 (H)   Hypoglycemia. Needs insulin adjustment.  Recommendation: Decrease Lantus to 24 units QHS Decrease Novolog from 10 to 5 units tidwc if pt eats >50% meal. Add Novolog sensitive tidwc.  Will continue to follow. Thank you. Lorenda Peck, RD, LDN, CDE Inpatient Diabetes Coordinator [phone]

## 2014-09-15 NOTE — Progress Notes (Addendum)
Inpatient RN visit- [place] 3W Room 1331-[facility] RN Visit-[name] RN  Related admission to [facility] diagnosis of Pancreatic Ca.  Pt is Full code.   Pt seen at bedside, eyes closed appears to be sleeping. Respirations even, unlabored, skin very warm to touch. She remains on IV antibiotic therapy lab results show blood cultures positive for gram negative rods. Pt appears to have made a significant change in condition since visit yesterday. Staff RN [name] present in pt room. Pt minimally responsive to voice. Per Staff RN and chart review she has had minimal po intake, was not alert enough to take her po medications this am and has not required any pain medication since 10/27 at 11:30pm.  Prior to visit, Probation officer recvd a phone call from [name] reporting that pt's oncologist, Dr. Benay Spice had seen pt and feels that she is [employer] appropriate.Staff RN confirmed her discussion of same with Dr. Benay Spice this morning. Dr. Gearldine Shown EPIC note not yet posted.  No family present during visit. Above Information relayed to pt's [place].  [facility] will continue to follow.   Patient's home medication list and transfer summary is on shadow chart.  Please call [facility] @ [phone]-  with any hospice needs.   Thank you. [name], RN  [facility]  Hospice Liaison  [phone])

## 2014-09-16 DIAGNOSIS — J150 Pneumonia due to Klebsiella pneumoniae: Secondary | ICD-10-CM

## 2014-09-16 DIAGNOSIS — B961 Klebsiella pneumoniae [K. pneumoniae] as the cause of diseases classified elsewhere: Secondary | ICD-10-CM

## 2014-09-16 DIAGNOSIS — R5081 Fever presenting with conditions classified elsewhere: Secondary | ICD-10-CM

## 2014-09-16 LAB — GLUCOSE, CAPILLARY
GLUCOSE-CAPILLARY: 56 mg/dL — AB (ref 70–99)
GLUCOSE-CAPILLARY: 80 mg/dL (ref 70–99)
GLUCOSE-CAPILLARY: 54 mg/dL — AB (ref 70–99)
GLUCOSE-CAPILLARY: 162 mg/dL — AB (ref 70–99)
Glucose-Capillary: 55 mg/dL — ABNORMAL LOW (ref 70–99)
Glucose-Capillary: 73 mg/dL (ref 70–99)
Glucose-Capillary: 48 mg/dL — ABNORMAL LOW (ref 70–99)
Glucose-Capillary: 67 mg/dL — ABNORMAL LOW (ref 70–99)
Glucose-Capillary: 96 mg/dL (ref 70–99)
Glucose-Capillary: 52 mg/dL — ABNORMAL LOW (ref 70–99)
Glucose-Capillary: 47 mg/dL — ABNORMAL LOW (ref 70–99)

## 2014-09-16 LAB — CBC
HCT: 22.6 % — ABNORMAL LOW (ref 36.0–46.0)
HEMOGLOBIN: 7.3 g/dL — AB (ref 12.0–15.0)
MCH: 24.1 pg — AB (ref 26.0–34.0)
MCHC: 32.3 g/dL (ref 30.0–36.0)
MCV: 74.6 fL — ABNORMAL LOW (ref 78.0–100.0)
Platelets: 293 10*3/uL (ref 150–400)
RBC: 3.03 MIL/uL — ABNORMAL LOW (ref 3.87–5.11)
RDW: 19.6 % — ABNORMAL HIGH (ref 11.5–15.5)
WBC: 8.9 10*3/uL (ref 4.0–10.5)

## 2014-09-16 LAB — CULTURE, BLOOD (ROUTINE X 2)

## 2014-09-16 LAB — BASIC METABOLIC PANEL
Anion gap: 9 (ref 5–15)
BUN: 15 mg/dL (ref 6–23)
CO2: 25 mEq/L (ref 19–32)
Calcium: 8.3 mg/dL — ABNORMAL LOW (ref 8.4–10.5)
Chloride: 104 mEq/L (ref 96–112)
Creatinine, Ser: 0.63 mg/dL (ref 0.50–1.10)
GFR calc Af Amer: >90 mL/min (ref >90)
GFR calc non Af Amer: >90 mL/min (ref >90)
GLUCOSE: 62 mg/dL — AB (ref 70–99)
POTASSIUM: 3.8 meq/L (ref 3.7–5.3)
Sodium: 138 mEq/L (ref 137–147)

## 2014-09-16 MED ORDER — DEXTROSE 50 % IV SOLN
50.0000 mL | Freq: Once | INTRAVENOUS | Status: AC | PRN
  Administered 2014-09-16: 50 mL via INTRAVENOUS

## 2014-09-16 MED ORDER — AMOXICILLIN-POT CLAVULANATE 875-125 MG PO TABS: 1.0000 | ORAL_TABLET | Freq: Two times a day (BID) | ORAL | Status: DC

## 2014-09-16 MED ORDER — LIP MEDEX EX OINT
TOPICAL_OINTMENT | CUTANEOUS | Status: AC
  Administered 2014-09-16: 15:00:00
  Filled 2014-09-16: qty 7

## 2014-09-16 MED ORDER — INSULIN GLARGINE 100 UNIT/ML ~~LOC~~ SOLN
10.0000 [IU] | Freq: Every day | SUBCUTANEOUS | Status: DC
  Administered 2014-09-17: 10 [IU] via SUBCUTANEOUS
  Filled 2014-09-16 (×2): qty 0.1

## 2014-09-16 MED ORDER — INSULIN GLARGINE 100 UNIT/ML ~~LOC~~ SOLN
20.0000 [IU] | Freq: Every day | SUBCUTANEOUS | Status: DC
  Filled 2014-09-16: qty 0.2

## 2014-09-16 MED ORDER — CEFUROXIME AXETIL 250 MG PO TABS
250.0000 mg | ORAL_TABLET | Freq: Two times a day (BID) | ORAL | Status: DC
  Filled 2014-09-16 (×2): qty 1

## 2014-09-16 MED ORDER — AMPICILLIN 250 MG PO CAPS
250.0000 mg | ORAL_CAPSULE | Freq: Four times a day (QID) | ORAL | Status: DC
  Filled 2014-09-16 (×4): qty 1

## 2014-09-16 MED ORDER — DEXTROSE 50 % IV SOLN
INTRAVENOUS | Status: AC
  Filled 2014-09-16: qty 50

## 2014-09-16 MED ORDER — AMOXICILLIN-POT CLAVULANATE 875-125 MG PO TABS
1.0000 | ORAL_TABLET | Freq: Two times a day (BID) | ORAL | Status: DC
  Administered 2014-09-16 – 2014-09-20 (×8): 1 via ORAL
  Filled 2014-09-16 (×10): qty 1

## 2014-09-16 MED ORDER — DEXTROSE 5 % IV SOLN
INTRAVENOUS | Status: DC
  Administered 2014-09-16 – 2014-09-19 (×6): via INTRAVENOUS

## 2014-09-16 NOTE — Progress Notes (Signed)
Inpatient RN visit- [place] [facility] RN Visit Related admission to [facility] diagnosis of Pancreatic Ca. Pt is Full code.  Pt seen at bedside, [facility] CSW [place] in room, patient indicated to [place] she was in agreement for CSW to contact her Dtr Daiva Eves; Probation officer joined by Leisure centre manager -pt having issues with labile blood sugars most of morning (current CBG =48)- staff encouraging pt to take juice -Writer sat with patient, who began to re-count how she first found out she had problems with her sugar; pt was very talkative, speech clear, stated she was feeling very tired, felt she had been through a lot and was tired of fighting; pt also voiced she was very concerned about her son- making sure he was cared for and had a place to live she voiced her daughter helps but has her own issues; she stated she has some questions about insurance papers and would like to talk to the SW about this. Lunch came- pt taking several bites of pot roast and 3-4 spoonfuls of mashed potatoes, ate all of peaches and taking some sips of sprite. Patient did not complain of pain during this visit -per chart review pt required PRN pain medication X 2 since midnight Patient stated she had talked with Dr Benay Spice this morning and is aware he is recommending 'something other than home but i am not sure I want that right now'; during visit RN Maudie Mercury came into room to inform,patient's daughter contacted RN and patient requesting Dr Benay Spice call and speak with her daughter (dtr's cell number [phone]) given to staff RN who will relay to contact Dr Benay Spice.   Patient aware [facility] SW will follow up later in the day; no family present at bedside during visit [facility] will continue to follow daily during hospitalization. Patient's home medication list and transfer summary is on shadow chart.  Please call [facility] @ [phone]- with any hospice needs.  Danton Sewer, RN MSN [facility]

## 2014-09-16 NOTE — Progress Notes (Signed)
IP PROGRESS NOTE  Subjective:   She appears stable. She complains of back pain this morning. Objective: Vital signs in last 24 hours: Blood pressure 141/80, pulse 88, temperature 98.4 F (36.9 C), temperature source Oral, resp. rate 16, height 5\' 2"  (1.575 m), weight 118 lb (53.524 kg), SpO2 100.00%.  Intake/Output from previous day: 10/29 0701 - 10/30 0700 In: 220 [P.O.:220] Out: -   Physical Exam:  HEENT: No thrush  Portacath/PICC-without erythema  Lab Results:  Recent Labs  09/15/14 0525 09/16/14 0510  WBC 8.9 8.9  HGB 7.3* 7.3*  HCT 23.8* 22.6*  PLT 281 293    BMET  Recent Labs  09/15/14 0525 09/16/14 0510  NA 132* 138  K 3.8 3.8  CL 99 104  CO2 25 25  GLUCOSE 190* 62*  BUN 14 15  CREATININE 0.62 0.63  CALCIUM 8.0* 8.3*    Studies/Results: No results found.  Medications: I have reviewed the patient's current medications.  Assessment/Plan:  1. 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  Initiation of radiation and Xeloda 07/20/2013 with completion 08/09/2013.  CT scans abdomen/pelvis done 11/01/2013 for evaluation of increased abdominal pain showed stable infiltrating pancreatic mass. No progression identified. Stable peripancreatic lymph nodes. 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 No evidence of metastatic disease.  Pancreatic head mass stable on CT 01/03/2014 and 03/07/2014.  CA 19-9 736 on 05/24/2014.  Initiation of gemcitabine/Abraxane on a 2 week schedule 05/24/2014.  CA 19-9 improved on 07/06/2014.  CT scan 08/02/2014 (comparison scan 03/07/2014) showed a new hypervascular mass measuring 5 cm by 6 cm in the right hepatic lobe. 2. Obstructive jaundice secondary to #1. Status post placement of a bile duct stent on 05/24/2013. Status post placement of a metal stent 10/21/2013. 3. Pain -likely  secondary to pancreas cancer, no improvement following the celiac plexus nerve block 02/10/2014. She continues Oxycodone and hydrocodone. 4. Diabetes. 5. History of chronic pancreatitis. 6. History of alcohol use. 7. History of tobacco use. 8. Anxiety/depression. 9. History of a Cutaneous nodule at the left flank. Question cyst, question metastasis. 10. Abnormal EKG, question Brugada syndrome. 11. Hospitalization 08/12/2013 through 08/15/2013 with nausea/vomiting and abdominal pain. Improved. 12. 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 Portal vein patent. New wall thickening of the mid to distal stomach and the hepatic flexure and proximal transverse colon. 13. 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 She discontinued Lovenox due to abdominal wall ecchymoses and hematomas. 14. Admission 12/22/2013 with strep viridans bacteremia status post 4 week course of ceftriaxone.  15. Status post celiac plexus nerve block 02/10/2014.  16. 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 She started Pancrease after an office visit 02/07/2014. She continues to have intermittent diarrhea. She does not take the pancreatic enzyme replacement consistently. 17. Elevated liver enzymes. Possibly related to a biliary stent obstruction 18. Admission 04/09/2014 with Escherichia coli and fungal bacteremia, completing outpatient ceftriaxone and micafungin. 19. Hypokalemia. She is on a potassium supplement. 20. Status post Port-A-Cath placement 06/14/2014. 21. Anorexia, depression. Remeron initiated 06/20/2014. It is not clear that she is taking Remeron. 22. Fever/gram-negative bacteremia, Klebsiella pneumoniae-most likely secondary to a biliary infection    Ms. Arruda is now  afebrile. She presented with Klebsiella bacteremia. This occurs in the setting of pancreas cancer and a biliary stent.   I discussed home Hospice care versus [facility] with Ms. Mallie Mussel. I also discussed the case with her daughter by telephone. I think she would be a good candidate for [facility]. Her daughter feels she is able to care for herself in the home at present.  Recommendations: 1. Continue antibiotics per the medicine service, GI consultation to evaluate the biliary stent 2. continue Roxanol and oxycodone for pain 3. consult care management to meet with daughter for discussion of the home situation.continue home Hospice care  4. consolidate medications for comfort care 5. followup as scheduled at the [facility] 09/20/2014, I will see her 09/19/2014 if she remains in the hospital   Please call Oncology as needed over the weekend.     LOS: 3 days   [place]  09/16/2014, 1:58 PM

## 2014-09-16 NOTE — Evaluation (Signed)
Occupational Therapy Evaluation Patient Details Name: [name] [name] MRN: [id number] DOB: 1956/09/24 Today's Date: 09/16/2014    History of Present Illness Pt is a 58 year old female admitted for HCAP with hx pancreatic cancer, stroke, DM, hearing impaired.   Clinical Impression   Pt was admitted for pna.  She has pancreatic CA.  Pt lives with son but has been mostly independent with adls.  She reports son is home with her.  Pt needs min guard to min A for adls and bathroom mobility.  Will follow in acute with overall supervision level goals.    Follow Up Recommendations  Supervision/Assistance - 24 hour    Equipment Recommendations  None recommended by OT    Recommendations for Other Services       Precautions / Restrictions Precautions Precautions: Fall Restrictions Weight Bearing Restrictions: No      Mobility Bed Mobility Overal bed mobility: Needs Assistance Bed Mobility: Supine to Sit     Supine to sit: Min assist Sit to supine: Mod assist   General bed mobility comments: assist for Bil LEs for back to bed  Transfers Overall transfer level: Needs assistance Equipment used: Rolling walker (2 wheeled) Transfers: Sit to/from Stand Sit to Stand: Min assist Stand pivot transfers: Min guard       General transfer comment: min A, no cues needed for UE placement, however, she tends to keep RW out away from her body    Balance Overall balance assessment: Needs assistance         Standing balance support: Single extremity supported Standing balance-Leahy Scale: Fair Standing balance comment: able to release with one arm for adls                            ADL Overall ADL's : Needs assistance/impaired     Grooming: Supervision/safety;Standing;Oral care   Upper Body Bathing: Set up;Sitting   Lower Body Bathing: Sit to/from stand;Min guard   Upper Body Dressing : Min guard   Lower Body Dressing: Minimal assistance;Sit to/from stand   Toilet  Transfer: Minimal assistance;RW;Ambulation   Toileting- Water quality scientist and Hygiene: Min guard;Sit to/from stand         General ADL Comments: Pt dons socks she walks on over socks she sleeps in.  Able to don first pair.  Pt very thorough with brushing teeth.  Min guard/close supervision for static balance during adls.  Helped pt place dinner order after tx.     Vision                     Perception     Praxis      Pertinent Vitals/Pain Pain Assessment: Faces Pain Score: 2  Faces Pain Scale: Hurts a little bit Pain Location: back Pain Intervention(s): Limited activity within patient's tolerance;Monitored during session     Hand Dominance     Extremity/Trunk Assessment Upper Extremity Assessment Upper Extremity Assessment: Generalized weakness   Lower Extremity Assessment Lower Extremity Assessment: Generalized weakness       Communication Communication Communication: HOH   Cognition Arousal/Alertness: Awake/alert Behavior During Therapy: WFL for tasks assessed/performed Overall Cognitive Status: Mostly Within Functional Limits for tasks assessed--may have decreased STM or may be due to medication                     General Comments       Exercises       Shoulder Instructions  Home Living Family/patient expects to be discharged to:: Private residence Living Arrangements: Children Available Help at Discharge: Family;Available 24 hours/day Type of Home: Apartment Home Access: Stairs to enter     Home Layout: One level         Bathroom Toilet: Standard Unclear as to whether shower is a walk in shower or tub     Home Equipment: Walker - 4 wheels;Wheelchair - manual;Shower seat;Bedside commode;Hospital bed   Additional Comments: pt reports performing stairs by crawling and using rail to pull herself back to standing      Prior Functioning/Environment Level of Independence: Needs assistance  Gait / Transfers Assistance  Needed: reports she sometimes uses assistive device, mostly ambulates around her room, does not do any long distances           OT Diagnosis: Generalized weakness   OT Problem List: Decreased strength;Decreased activity tolerance;Impaired balance (sitting and/or standing);Decreased cognition;Pain   OT Treatment/Interventions: Self-care/ADL training;DME and/or AE instruction;Patient/family education;Balance training    OT Goals(Current goals can be found in the care plan section) Acute Rehab OT Goals Patient Stated Goal: be as independent as possible; stay at home and not go somewhere where people are dying OT Goal Formulation: With patient Time For Goal Achievement: 09/30/14 Potential to Achieve Goals: Good ADL Goals Pt Will Transfer to Toilet: with supervision;bedside commode;ambulating Pt Will Perform Toileting - Clothing Manipulation and hygiene: with supervision;sit to/from stand Additional ADL Goal #1: pt will perform LB adls with supervision, set up, sit to stand  OT Frequency: Min 2X/week   Barriers to D/C:            Co-evaluation              End of Session    Activity Tolerance: Patient tolerated treatment well Patient left: in bed;with call bell/phone within reach;with bed alarm set   Time: 1610-9604 OT Time Calculation (min): 27 min Charges:  OT General Charges $OT Visit: 1 Procedure OT Evaluation $Initial OT Evaluation Tier I: 1 Procedure OT Treatments $Self Care/Home Management : 8-22 mins G-Codes:    Ayomide Zuleta 2014-10-05, 4:48 PM  Lesle Chris, OTR/L [phone] 05-Oct-2014

## 2014-09-16 NOTE — Evaluation (Signed)
Physical Therapy Evaluation Patient Details Name: [name] [name] MRN: [id number] DOB: 1956-05-18 Today's Date: 09/16/2014   History of Present Illness  Pt is a 58 year old female admitted for HCAP with hx of stage IV pancreatic cancer, stroke, DM, hearing impaired.  Clinical Impression  Pt currently with functional limitations due to the deficits listed below (see PT Problem List).  Pt will benefit from skilled PT to increase their independence and safety with mobility to allow discharge to the venue listed below.  Pt reports she would like to increase her independence and states she does not want to "go somewhere where I have to think about other people dying, I want to focus on me."  Pt requires assist for bed mobility and standing however ambulating good distance in hallway with RW.  Pt does present with unsteadiness so recommend supervision for mobility if d/c home.  Will continue to see pt in acute care however per chart review plans for possible f/u residential hospice and [facility] following pt.     Follow Up Recommendations Supervision for mobility/OOB (plans for residential hospice vs home)    Equipment Recommendations  None recommended by PT    Recommendations for Other Services       Precautions / Restrictions Precautions Precautions: Fall      Mobility  Bed Mobility Overal bed mobility: Needs Assistance Bed Mobility: Supine to Sit     Supine to sit: Mod assist Sit to supine: Mod assist   General bed mobility comments: required assist for LEs off and onto bed  Transfers Overall transfer level: Needs assistance Equipment used: Rolling walker (2 wheeled) Transfers: Sit to/from [facility] Sit to Stand: Mod assist;Min assist Stand pivot transfers: Min guard       General transfer comment: mod assist with just using bed rail, however performed again with RW in front of pt and only required min assist, also min to/from [facility] with cues for hand  placement  Ambulation/Gait Ambulation/Gait assistance: Min guard Ambulation Distance (Feet): 400 Feet Assistive device: Rolling walker (2 wheeled) Gait Pattern/deviations: Step-through pattern;Trunk flexed;Decreased stride length     General Gait Details: verbal cues for use of RW, pt dislikes RW as she prefers her rollator (easier to manuever per pt).  Pt reports happy to up and ambulating  Stairs            Wheelchair Mobility    Modified Rankin (Stroke Patients Only)       Balance Overall balance assessment: Needs assistance         Standing balance support: Bilateral upper extremity supported;During functional activity Standing balance-Leahy Scale: Fair Standing balance comment: pt able to perform pericare with 1 UE assist on RW to balance and washed hands with forearms leaning on sink with supervision                             Pertinent Vitals/Pain Pain Assessment: Faces Faces Pain Scale: Hurts a little bit Pain Location: reports bil flank pain however not rated during gait Pain Intervention(s): Monitored during session;Limited activity within patient's tolerance    Home Living Family/patient expects to be discharged to:: Private residence Living Arrangements: Children (son) Available Help at Discharge: Family;Available 24 hours/day Type of Home: Apartment Home Access: Stairs to enter     Home Layout: One level Home Equipment: Environmental consultant - 4 wheels;Wheelchair - manual;Shower seat;Bedside commode;Hospital bed Additional Comments: pt reports performing stairs by crawling and using rail to pull  herself back to standing    Prior Function Level of Independence: Needs assistance   Gait / Transfers Assistance Needed: reports she sometimes uses assistive device, mostly ambulates around her room, does not do any long distances            Hand Dominance        Extremity/Trunk Assessment               Lower Extremity Assessment:  Generalized weakness         Communication   Communication: HOH  Cognition Arousal/Alertness: Awake/alert Behavior During Therapy: WFL for tasks assessed/performed Overall Cognitive Status: Within Functional Limits for tasks assessed                      General Comments      Exercises        Assessment/Plan    PT Assessment Patient needs continued PT services  PT Diagnosis Difficulty walking;Generalized weakness   PT Problem List Decreased strength;Decreased activity tolerance;Decreased mobility  PT Treatment Interventions DME instruction;Gait training;Functional mobility training;Therapeutic activities;Therapeutic exercise;Patient/family education   PT Goals (Current goals can be found in the Care Plan section) Acute Rehab PT Goals Patient Stated Goal: increase strength and improve quality of life by improving mobility and increasing independence PT Goal Formulation: With patient Time For Goal Achievement: 09/23/14 Potential to Achieve Goals: Good    Frequency Min 3X/week   Barriers to discharge        Co-evaluation               End of Session Equipment Utilized During Treatment: Gait belt Activity Tolerance: Patient tolerated treatment well Patient left: in bed;with call bell/phone within reach;with bed alarm set           Time: 1457-1532 PT Time Calculation (min): 35 min   Charges:   PT Evaluation $Initial PT Evaluation Tier I: 1 Procedure PT Treatments $Gait Training: 8-22 mins $Therapeutic Activity: 8-22 mins   PT G Codes:          Teven Mittman,KATHrine E 09/16/2014, 4:03 PM Carmelia Bake, PT, DPT 09/16/2014 Pager: [phone]

## 2014-09-16 NOTE — Clinical Social Work Note (Signed)
Spoke with patient- she was agitated and not able to carry on much of a conversation- attempted to ask her about her dc plans but this was limited because of her mental state (low CBG's)- she was able to tell me she lives with her disabled son in section 8 housing and gave me permission to call her daughter to further discuss dcplans- I have left message for daughter as well as Hospice SW and await calls back-   Eduard Clos, MSW, [employer]

## 2014-09-16 NOTE — Progress Notes (Signed)
Hypoglycemic Event  CBG: 48  Treatment: 15 GM carbohydrate snack  Symptoms: None  Follow-up CBG: Time:1318 CBG Result:67  Possible Reasons for Event: Lantus began  Comments/MD notified:Dr. Wyline Copas notified. D5 added to IV fluids and Lantus decreased    [name] [name]  Remember to initiate Hypoglycemia Order Set & complete

## 2014-09-16 NOTE — Progress Notes (Signed)
Hospice and [facility]) Chaplain Visit: Follow-up visit to pt in continued hospitalization.  Pt awake in bed, initially drowsy but interactive, and more clear and talkative as visit continued. Pt open about frustration with getting infections, and how "the medications or something is making me sleepy all day."  As unit nurse checked pt's blood sugar and reported it lower than expected, pt replied "that's why I'm sleepy."  Pt open about "doing all I can, but there are things I can't control."  Pt shared faith affirmation and confidence that she is in God's hands for all she needs, and is hopeful she can gain strength to return home. Pt aware that she needs more care now, and not sure how care would be available in home, and also aware that [place] [facility] is being suggested, but feels that she is not yet ready for [employer]. Pt instead is hopeful she might be eligible for PT at a rehab placement.  Pt feels she goes back and forth with wanting to "give up and go to [place], and then feeling like God wants me here for some reason, my moods are up and down lately with all that's been happening."  Pt welcomed prayer of blessing and guidance.  [address], [place], [place], [place]

## 2014-09-16 NOTE — Progress Notes (Signed)
Hypoglycemic Event  CBG: 52  Treatment: 15 GM carbohydrate snack  Symptoms: None  Follow-up CBG: Time:1750 CBG Result:47  Possible Reasons for Event: Unknown  Comments/MD notified:Dr Chui notified. Pt had eaten 75% of  Dinner at time of CBG follow up. No s/s of hypoglycemia. MD notified. IVF rate increased to 75 and HS Lantus further decreased. Pt has eaten at least 75% of each of her meals today and 100% of  juices throughout the day. CBG 54 @ 1953. 1 amp of D50 given at [place]. CBG-162 at 2020. Will notify night RN.     [name] [name]  Remember to initiate Hypoglycemia Order Set & complete

## 2014-09-16 NOTE — Progress Notes (Signed)
Hypoglycemic Event  CBG: 56  Treatment: 15 GM carbohydrate snack  Symptoms: Sweaty  Follow-up CBG: Time:0804 CBG Result:80  Possible Reasons for Event: Inadequate meal intake  Comments/MD notified:Pt given snack. Breakfast tray already ordered    [name] [name]  Remember to initiate Hypoglycemia Order Set & complete

## 2014-09-16 NOTE — Progress Notes (Signed)
Hypoglycemic Event  CBG: 55  Treatment: Pt given juice  Symptoms: Pt states she felt "she needed something sweet". No observable symptoms.   Follow-up CBG: Time:0600 CBG Result: 73  Possible Reasons for Event: low PO intake  Comments/MD notified: Will continue to monitor.     [name] [name] A

## 2014-09-16 NOTE — Progress Notes (Signed)
[facility] PROGRESS NOTE  [name] [name] LKG:[id number] DOB: 1955-12-02 DOA: 09/13/2014 PCP: Philis Fendt, MD  Assessment/Plan: 1. HCAP with UTI and Klebsiella bacteremia with sepsis 1. Had been cont on vanc and cefepime 2. Ordered flutter valve for secretions 3. Presenting leukocytosis resolved 4. Consider transition to po augmentin to complete course 2. HTN 1. BP stable and controlled 2. Cont monitor 3. DM 1. Pt not tolerating much PO 2. Holding scheduled pre-meal insulin 3. CBG this AM of 56 4. Decrease lantus to 20 units from 30 5. Cont SSI 4. Pancreatic cancer 1. Followed by oncology 5. HLD 1. On statin 6. DVT prophylaxis 1. SCD's  Code Status: Full Family Communication: Pt in room Disposition Plan: Pending   Consultants:    Procedures:    Antibiotics:  Vanc 10/27>>>10/30  Cefepime 10/27>>> 10/30  Augmetin 10/30>>>  HPI/Subjective: More awake, but still feels weak  Objective: Filed Vitals:   09/15/14 0517 09/15/14 1440 09/15/14 2155 09/16/14 0550  BP: 119/64 138/79 167/92 141/80  Pulse: 84 94 99 88  Temp: 99.1 F (37.3 C) 98.7 F (37.1 C) 98.8 F (37.1 C) 98.4 F (36.9 C)  TempSrc: Oral Oral Oral Oral  Resp: 18 16 16 16   Height:      Weight:      SpO2: 100% 100% 100% 100%    Intake/Output Summary (Last 24 hours) at 09/16/14 1047 Last data filed at 09/16/14 0955  Gross per 24 hour  Intake    460 ml  Output      0 ml  Net    460 ml   Filed Weights   09/13/14 2024  Weight: 53.524 kg (118 lb)    Exam:   General:  Awake, in nad  Cardiovascular: regular, s1, s2  Respiratory: coarse, normal resp effort, no wheezing  Abdomen: distended, nontender, pos bs  Musculoskeletal: perfused, no clubbing   Data Reviewed: Basic Metabolic Panel:  Recent Labs Lab 09/13/14 1857 09/14/14 0502 09/15/14 0525 09/16/14 0510  NA 137 136* 132* 138  K 4.0 3.9 3.8 3.8  CL 100 101 99 104  CO2 23 26 25 25   GLUCOSE 146* 161* 190* 62*   BUN 12 11 14 15   CREATININE 0.74 0.58 0.62 0.63  CALCIUM 8.4 8.1* 8.0* 8.3*   Liver Function Tests:  Recent Labs Lab 09/13/14 1857  AST 66*  ALT 20  ALKPHOS 664*  BILITOT 2.1*  PROT 7.0  ALBUMIN 2.4*    Recent Labs Lab 09/13/14 1857  LIPASE 4*   No results found for this basename: AMMONIA,  in the last 168 hours CBC:  Recent Labs Lab 09/13/14 1857 09/14/14 0502 09/15/14 0525 09/16/14 0510  WBC 19.3* 11.2* 8.9 8.9  NEUTROABS 17.7* 10.0*  --   --   HGB 8.1* 7.1* 7.3* 7.3*  HCT 25.3* 22.2* 23.8* 22.6*  MCV 74.9* 74.7* 76.5* 74.6*  PLT 358 289 281 293   Cardiac Enzymes: No results found for this basename: CKTOTAL, CKMB, CKMBINDEX, TROPONINI,  in the last 168 hours BNP (last 3 results) No results found for this basename: PROBNP,  in the last 8760 hours CBG:  Recent Labs Lab 09/15/14 2152 09/16/14 0534 09/16/14 0603 09/16/14 0734 09/16/14 0804  GLUCAP 175* 55* 73 56* 80    Recent Results (from the past 240 hour(s))  CULTURE, BLOOD (ROUTINE X 2)     Status: None   Collection Time    09/13/14  6:58 PM      Result Value Ref Range Status  Specimen Description BLOOD RIGHT ANTECUBITAL   Final   Special Requests BOTTLES DRAWN AEROBIC AND ANAEROBIC 5ML   Final   Culture  Setup Time     Final   Value: 09/13/2014 22:33     Performed at [facility]   Culture     Final   Value: KLEBSIELLA PNEUMONIAE     Note: Gram Stain Report Called to,Read Back By and Verified With: Aldean Baker RN [phone]     Performed at [facility]   Report Status 09/16/2014 FINAL   Final   Organism ID, Bacteria KLEBSIELLA PNEUMONIAE   Final  CULTURE, BLOOD (ROUTINE X 2)     Status: None   Collection Time    09/13/14  6:59 PM      Result Value Ref Range Status   Specimen Description BLOOD RIGHT PORT   Final   Special Requests BOTTLES DRAWN AEROBIC AND ANAEROBIC 5ML   Final   Culture  Setup Time     Final   Value: 09/13/2014 22:34     Performed at [facility]   Culture     Final   Value: KLEBSIELLA PNEUMONIAE     Note: SUSCEPTIBILITIES PERFORMED ON PREVIOUS CULTURE WITHIN THE LAST 5 DAYS.     Note: Gram Stain Report Called to,Read Back By and Verified With: [place] [phone]     Performed at [facility]   Report Status 09/16/2014 FINAL   Final  URINE CULTURE     Status: None   Collection Time    09/13/14  8:46 PM      Result Value Ref Range Status   Specimen Description URINE, CLEAN CATCH   Final   Special Requests NONE   Final   Culture  Setup Time     Final   Value: 09/14/2014 01:45     Performed at [place]     Final   Value: NO GROWTH     Performed at [facility]   Culture     Final   Value: NO GROWTH     Performed at [facility]   Report Status 09/15/2014 FINAL   Final  CLOSTRIDIUM DIFFICILE BY PCR     Status: None   Collection Time    09/15/14 12:27 PM      Result Value Ref Range Status   C difficile by pcr NEGATIVE  NEGATIVE Final   Comment: Performed at [facility]     Studies: No results found.  Scheduled Meds: . aspirin EC  81 mg Oral Daily  . feeding supplement (GLUCERNA SHAKE)  237 mL Oral BID PC  . gabapentin  300 mg Oral TID  . insulin aspart  0-15 Units Subcutaneous TID WC  . insulin aspart  0-5 Units Subcutaneous QHS  . insulin glargine  30 Units Subcutaneous QHS  . lipase/protease/amylase  12,000 Units Oral TID AC  . metoCLOPramide  5 mg Oral TID AC  . mirtazapine  15 mg Oral QHS  . pantoprazole  40 mg Oral Daily  . piperacillin-tazobactam (ZOSYN)  IV  3.375 g Intravenous Q8H  . potassium chloride  10 mEq Oral Daily  . simvastatin  10 mg Oral QHS  . sodium chloride  3 mL Intravenous Q12H  . sucralfate  1 g Oral BID  . vancomycin  750 mg Intravenous Q12H   Continuous Infusions:   Principal Problem:   Pneumonia Active Problems:   HTN (hypertension)  Dyslipidemia   Pancreatic cancer   Malnutrition of moderate  degree  Time spent: 51min  Harshan Kearley, [facility] Pager [phone]. If 7PM-7AM, please contact night-coverage at [email or web address], password [facility] 09/16/2014, 10:47 AM  LOS: 3 days

## 2014-09-17 ENCOUNTER — Inpatient Hospital Stay (HOSPITAL_COMMUNITY)

## 2014-09-17 LAB — BASIC METABOLIC PANEL
Anion gap: 10 (ref 5–15)
BUN: 14 mg/dL (ref 6–23)
CALCIUM: 8.4 mg/dL (ref 8.4–10.5)
CHLORIDE: 97 meq/L (ref 96–112)
CO2: 25 meq/L (ref 19–32)
Creatinine, Ser: 0.61 mg/dL (ref 0.50–1.10)
GFR calc Af Amer: >90 mL/min (ref >90)
GFR calc non Af Amer: >90 mL/min (ref >90)
GLUCOSE: 92 mg/dL (ref 70–99)
Potassium: 4.2 mEq/L (ref 3.7–5.3)
SODIUM: 132 meq/L — AB (ref 137–147)

## 2014-09-17 LAB — CBC
HCT: 21.3 % — ABNORMAL LOW (ref 36.0–46.0)
Hemoglobin: 6.8 g/dL — CL (ref 12.0–15.0)
MCH: 24 pg — AB (ref 26.0–34.0)
MCHC: 31.9 g/dL (ref 30.0–36.0)
MCV: 75.3 fL — ABNORMAL LOW (ref 78.0–100.0)
Platelets: 316 10*3/uL (ref 150–400)
RBC: 2.83 MIL/uL — ABNORMAL LOW (ref 3.87–5.11)
RDW: 19.8 % — ABNORMAL HIGH (ref 11.5–15.5)
WBC: 8.9 10*3/uL (ref 4.0–10.5)

## 2014-09-17 LAB — GLUCOSE, CAPILLARY
GLUCOSE-CAPILLARY: 91 mg/dL (ref 70–99)
GLUCOSE-CAPILLARY: 109 mg/dL — AB (ref 70–99)
Glucose-Capillary: 98 mg/dL (ref 70–99)
Glucose-Capillary: 153 mg/dL — ABNORMAL HIGH (ref 70–99)
Glucose-Capillary: 233 mg/dL — ABNORMAL HIGH (ref 70–99)

## 2014-09-17 LAB — STREP PNEUMONIAE URINARY ANTIGEN: Strep Pneumo Urinary Antigen: NEGATIVE

## 2014-09-17 LAB — ABO/RH: ABO/RH(D): A POS

## 2014-09-17 LAB — HEMOGLOBIN AND HEMATOCRIT, BLOOD
HCT: 30.8 % — ABNORMAL LOW (ref 36.0–46.0)
HEMOGLOBIN: 10.1 g/dL — AB (ref 12.0–15.0)

## 2014-09-17 LAB — PREPARE RBC (CROSSMATCH)

## 2014-09-17 MED ORDER — SODIUM CHLORIDE 0.9 % IV SOLN
Freq: Once | INTRAVENOUS | Status: AC
  Administered 2014-09-17: 11:00:00 via INTRAVENOUS

## 2014-09-17 NOTE — Progress Notes (Signed)
Full Code.  Related admission.  Patient sleeping peacefully at this time.  Did not awaken patient.  Spoke with Theadore Nan, RN for patient. Family leaning towards [employer] [facility].  This RN will notify on-call SW.  Family has just left and no one is at the bedside.  Chart reviewed.  Please contact [facility] [phone] with any questions or concern.  Vance Gather, RN [facility]

## 2014-09-17 NOTE — Progress Notes (Signed)
CRITICAL VALUE ALERT  Critical value received:  Hbg:6.8  Date of notification:  09/17/2014  Time of notification:  0730  Critical value read back:Yes.    Nurse who received alert:  Erma Heritage  MD notified (1st page):  Wyline Copas   Time of first page:  [phone]  Responding MD:  Wyline Copas  Time MD responded:  [phone]

## 2014-09-17 NOTE — Progress Notes (Signed)
[facility] PROGRESS NOTE  [name] [name] ZWC:[id number] DOB: May 24, 1956 DOA: 09/13/2014 PCP: Philis Fendt, MD  Assessment/Plan: 1. HCAP with UTI and Klebsiella bacteremia and questionable SBP with sepsis 1. Had been cont on vanc and cefepime 2. Ordered flutter valve for secretions 3. Presenting leukocytosis resolved 4. Transition to po augmentin to complete course 5. Ascites noted on exam and on imaging 6. Will order diagnostic/therapeutic paracentesis 2. HTN 1. BP stable and controlled 2. Cont monitor 3. DM 1. Pt not tolerating much PO 2. Holding scheduled pre-meal insulin 3. CBG this AM of 56 4. Decrease lantus to 20 units from 30 5. Cont SSI 4. Pancreatic cancer 1. Followed by oncology 2. Discussed with Oncology over the phone on 10/30 (Dr. Benay Spice) 5. HLD 1. On statin 6. DVT prophylaxis 1. SCD's  Code Status: Full Family Communication: Pt in room Disposition Plan: Pending   Consultants:    Procedures:    Antibiotics:  Vanc 10/27>>>10/30  Cefepime 10/27>>> 10/30  Augmetin 10/30>>>  HPI/Subjective: More awake, complains of abd fullness  Objective: Filed Vitals:   09/16/14 2144 09/17/14 0436 09/17/14 1106 09/17/14 1126  BP: 132/79 140/75 167/81 150/66  Pulse: 87 82 82 83  Temp: 99.3 F (37.4 C) 99.1 F (37.3 C) 99 F (37.2 C) 99.2 F (37.3 C)  TempSrc: Oral Oral Oral Oral  Resp: 16 16 16 16   Height:      Weight:      SpO2: 99% 99% 100% 100%    Intake/Output Summary (Last 24 hours) at 09/17/14 1348 Last data filed at 09/17/14 1111  Gross per 24 hour  Intake    810 ml  Output   1053 ml  Net   -243 ml   Filed Weights   09/13/14 2024  Weight: 53.524 kg (118 lb)    Exam:   General:  Awake, in nad  Cardiovascular: regular, s1, s2  Respiratory: coarse, normal resp effort, no wheezing  Abdomen: distended, decreased BS, fluid wave  Musculoskeletal: perfused, no clubbing   Data Reviewed: Basic Metabolic Panel:  Recent  Labs Lab 09/13/14 1857 09/14/14 0502 09/15/14 0525 09/16/14 0510 09/17/14 0550  NA 137 136* 132* 138 132*  K 4.0 3.9 3.8 3.8 4.2  CL 100 101 99 104 97  CO2 23 26 25 25 25   GLUCOSE 146* 161* 190* 62* 92  BUN 12 11 14 15 14   CREATININE 0.74 0.58 0.62 0.63 0.61  CALCIUM 8.4 8.1* 8.0* 8.3* 8.4   Liver Function Tests:  Recent Labs Lab 09/13/14 1857  AST 66*  ALT 20  ALKPHOS 664*  BILITOT 2.1*  PROT 7.0  ALBUMIN 2.4*    Recent Labs Lab 09/13/14 1857  LIPASE 4*   No results found for this basename: AMMONIA,  in the last 168 hours CBC:  Recent Labs Lab 09/13/14 1857 09/14/14 0502 09/15/14 0525 09/16/14 0510 09/17/14 0550  WBC 19.3* 11.2* 8.9 8.9 8.9  NEUTROABS 17.7* 10.0*  --   --   --   HGB 8.1* 7.1* 7.3* 7.3* 6.8*  HCT 25.3* 22.2* 23.8* 22.6* 21.3*  MCV 74.9* 74.7* 76.5* 74.6* 75.3*  PLT 358 289 281 293 316   Cardiac Enzymes: No results found for this basename: CKTOTAL, CKMB, CKMBINDEX, TROPONINI,  in the last 168 hours BNP (last 3 results) No results found for this basename: PROBNP,  in the last 8760 hours CBG:  Recent Labs Lab 09/16/14 1754 09/16/14 1923 09/16/14 2022 09/17/14 0433 09/17/14 0814  GLUCAP 43* 54* 162* 91 109*  Recent Results (from the past 240 hour(s))  CULTURE, BLOOD (ROUTINE X 2)     Status: None   Collection Time    09/13/14  6:58 PM      Result Value Ref Range Status   Specimen Description BLOOD RIGHT ANTECUBITAL   Final   Special Requests BOTTLES DRAWN AEROBIC AND ANAEROBIC 5ML   Final   Culture  Setup Time     Final   Value: 09/13/2014 22:33     Performed at [facility]   Culture     Final   Value: KLEBSIELLA PNEUMONIAE     Note: Gram Stain Report Called to,Read Back By and Verified With: Aldean Baker RN [phone]     Performed at [facility]   Report Status 09/16/2014 FINAL   Final   Organism ID, Bacteria KLEBSIELLA PNEUMONIAE   Final  CULTURE, BLOOD (ROUTINE X 2)     Status: None   Collection Time     09/13/14  6:59 PM      Result Value Ref Range Status   Specimen Description BLOOD RIGHT PORT   Final   Special Requests BOTTLES DRAWN AEROBIC AND ANAEROBIC 5ML   Final   Culture  Setup Time     Final   Value: 09/13/2014 22:34     Performed at [facility]   Culture     Final   Value: KLEBSIELLA PNEUMONIAE     Note: SUSCEPTIBILITIES PERFORMED ON PREVIOUS CULTURE WITHIN THE LAST 5 DAYS.     Note: Gram Stain Report Called to,Read Back By and Verified With: [place] [phone]     Performed at [facility]   Report Status 09/16/2014 FINAL   Final  URINE CULTURE     Status: None   Collection Time    09/13/14  8:46 PM      Result Value Ref Range Status   Specimen Description URINE, CLEAN CATCH   Final   Special Requests NONE   Final   Culture  Setup Time     Final   Value: 09/14/2014 01:45     Performed at [place]     Final   Value: NO GROWTH     Performed at [facility]   Culture     Final   Value: NO GROWTH     Performed at [facility]   Report Status 09/15/2014 FINAL   Final  CLOSTRIDIUM DIFFICILE BY PCR     Status: None   Collection Time    09/15/14 12:27 PM      Result Value Ref Range Status   C difficile by pcr NEGATIVE  NEGATIVE Final   Comment: Performed at [facility]     Studies: Dg Abd Portable 1v  09/17/2014   CLINICAL DATA:  Mid abdominal pain  EXAM: PORTABLE ABDOMEN - 1 VIEW  COMPARISON:  CT 08/24/2014  FINDINGS: Common bile duct wall stent noted. No dilated loops of large or small bowel. There is gas and stool in the rectum. Extension of the abdomen with V bowel loops collecting centrally suggests ascites.  IMPRESSION: 1. No evidence of bowel obstruction. 2. Suspected ascites as seen on comparison CT.   Electronically Signed   By: Suzy Bouchard M.D.   On: 09/17/2014 09:11   US Abdomen Limited Ruq  09/17/2014   CLINICAL DATA:  Biliary obstruction, right upper quadrant pain for several  weeks. Biliary stent placed December 2014. History pancreatic  cancer  EXAM: US ABDOMEN LIMITED - RIGHT UPPER QUADRANT  COMPARISON:  Ultrasound 01/04/2014, CT 08/24/2014  FINDINGS: Gallbladder:  There is dependent sludge versus small stones within the lumen of the gallbladder. Gallbladder wall is mildly thickened to 5 mm. No pericholecystic fluid. There is a moderate volume of intraperitoneal free fluid / ascites which likely contributes to the gallbladder wall thickening. Negative sonographic Murphy's sign.  Common bile duct:  Diameter: The biliary stent in place. Common bile duct measures 7 mm.  Liver:  There is mild intrahepatic biliary duct station on the left or right. No focal hepatic lesions identified. Moderate volume ascites appears increased from CT of 08/24/2014 although different techniques.  IMPRESSION: 1. Moderate volume of ascites a appears increased from CT of 08/24/2014. 2. Gallbladder wall thickening and sludge in the gallbladder. Gallbladder wall thickening is likely secondary to the ascites. No gallbladder distension. Negative sonographic Murphy's sign. 3. Mild intrahepatic biliary duct dilatation is similar to comparison CT.   Electronically Signed   By: Suzy Bouchard M.D.   On: 09/17/2014 10:05    Scheduled Meds: . amoxicillin-clavulanate  1 tablet Oral Q12H  . aspirin EC  81 mg Oral Daily  . feeding supplement (GLUCERNA SHAKE)  237 mL Oral BID PC  . gabapentin  300 mg Oral TID  . insulin aspart  0-15 Units Subcutaneous TID WC  . insulin aspart  0-5 Units Subcutaneous QHS  . insulin glargine  10 Units Subcutaneous QHS  . lipase/protease/amylase  12,000 Units Oral TID AC  . metoCLOPramide  5 mg Oral TID AC  . mirtazapine  15 mg Oral QHS  . pantoprazole  40 mg Oral Daily  . potassium chloride  10 mEq Oral Daily  . simvastatin  10 mg Oral QHS  . sodium chloride  3 mL Intravenous Q12H  . sucralfate  1 g Oral BID   Continuous Infusions: . dextrose 75 mL/hr at 09/17/14 0050     Principal Problem:   Pneumonia Active Problems:   HTN (hypertension)   Dyslipidemia   Pancreatic cancer   Malnutrition of moderate degree  Time spent: 54min  [place]enton Derks, [place] [facility] Pager [phone]. If 7PM-7AM, please contact night-coverage at [email or web address], password [facility] 09/17/2014, 1:48 PM  LOS: 4 days

## 2014-09-18 ENCOUNTER — Inpatient Hospital Stay (HOSPITAL_COMMUNITY)

## 2014-09-18 LAB — GLUCOSE, CAPILLARY
GLUCOSE-CAPILLARY: 110 mg/dL — AB (ref 70–99)
Glucose-Capillary: 67 mg/dL — ABNORMAL LOW (ref 70–99)
Glucose-Capillary: 76 mg/dL (ref 70–99)
Glucose-Capillary: 93 mg/dL (ref 70–99)
Glucose-Capillary: 121 mg/dL — ABNORMAL HIGH (ref 70–99)
Glucose-Capillary: 160 mg/dL — ABNORMAL HIGH (ref 70–99)

## 2014-09-18 LAB — COMPREHENSIVE METABOLIC PANEL
ALBUMIN: 2.1 g/dL — AB (ref 3.5–5.2)
ALT: 19 U/L (ref 0–35)
AST: 52 U/L — ABNORMAL HIGH (ref 0–37)
Alkaline Phosphatase: 689 U/L — ABNORMAL HIGH (ref 39–117)
Anion gap: 9 (ref 5–15)
BUN: 12 mg/dL (ref 6–23)
CO2: 24 meq/L (ref 19–32)
CREATININE: 0.58 mg/dL (ref 0.50–1.10)
Calcium: 8.7 mg/dL (ref 8.4–10.5)
Chloride: 100 mEq/L (ref 96–112)
GFR calc Af Amer: >90 mL/min (ref >90)
Glucose, Bld: 84 mg/dL (ref 70–99)
Potassium: 4.1 mEq/L (ref 3.7–5.3)
Sodium: 133 mEq/L — ABNORMAL LOW (ref 137–147)
Total Bilirubin: 3.1 mg/dL — ABNORMAL HIGH (ref 0.3–1.2)
Total Protein: 6.7 g/dL (ref 6.0–8.3)

## 2014-09-18 LAB — BODY FLUID CELL COUNT WITH DIFFERENTIAL
EOS FL: 0 %
Lymphs, Fluid: 9 %
Monocyte-Macrophage-Serous Fluid: 34 % — ABNORMAL LOW (ref 50–90)
NEUTROPHIL FLUID: 57 % — AB (ref 0–25)
Total Nucleated Cell Count, Fluid: 475 cu mm (ref 0–1000)

## 2014-09-18 LAB — TYPE AND SCREEN
ABO/RH(D): A POS
Antibody Screen: NEGATIVE
Unit division: 0
Unit division: 0

## 2014-09-18 LAB — CBC
HCT: 30.9 % — ABNORMAL LOW (ref 36.0–46.0)
Hemoglobin: 10 g/dL — ABNORMAL LOW (ref 12.0–15.0)
MCH: 25.7 pg — ABNORMAL LOW (ref 26.0–34.0)
MCHC: 32.4 g/dL (ref 30.0–36.0)
MCV: 79.4 fL (ref 78.0–100.0)
PLATELETS: 275 10*3/uL (ref 150–400)
RBC: 3.89 MIL/uL (ref 3.87–5.11)
RDW: 18.1 % — AB (ref 11.5–15.5)
WBC: 9.9 10*3/uL (ref 4.0–10.5)

## 2014-09-18 MED ORDER — MORPHINE SULFATE (CONCENTRATE) 10 MG /0.5 ML PO SOLN: 40.0000 mg | ORAL | Status: DC | PRN

## 2014-09-18 MED ORDER — INSULIN GLARGINE 100 UNIT/ML ~~LOC~~ SOLN
8.0000 [IU] | Freq: Every day | SUBCUTANEOUS | Status: DC
  Filled 2014-09-18 (×2): qty 0.08

## 2014-09-18 MED ORDER — MORPHINE SULFATE (CONCENTRATE) 10 MG/0.5ML PO SOLN
40.0000 mg | ORAL | Status: DC | PRN
  Administered 2014-09-18 – 2014-09-19 (×3): 40 mg via ORAL
  Filled 2014-09-18 (×3): qty 2

## 2014-09-18 NOTE — Progress Notes (Signed)
[facility] PROGRESS NOTE  [name] [name] ZDG:[id number] DOB: [birth year] DOA: 09/13/2014 PCP: Philis Fendt, MD  Assessment/Plan: 1. HCAP with UTI and Klebsiella bacteremia and SBP with sepsis 1. Initially had been cont on vanc and cefepime 2. Ordered flutter valve for secretions 3. Presenting leukocytosis resolved 4. Transitioned to po augmentin to complete course 5. Ascites noted on exam and on imaging 6. Pt is s/p diagnostic/therapeutic paracentesis with PMN of 270 2. HTN 1. BP stable and controlled 2. Cont monitor 3. DM 1. Pt not tolerating much PO 2. Holding scheduled pre-meal insulin 3. CBG holding <100 4. Decreased lantus to 10 units. Would further decrease to 8 units 5. Cont SSI 4. Pancreatic cancer 1. Followed by oncology 2. Discussed with Oncology over the phone on 10/30 (Dr. Benay Spice) 3. Unfortunate case. Plans for [facility]. SW consulted 5. HLD 1. On statin 6. DVT prophylaxis 1. SCD's  Code Status: Full Family Communication: Pt in room Disposition Plan: Pending   Consultants:    Procedures:    Antibiotics:  Vanc 10/27>>>10/30  Cefepime 10/27>>> 10/30  Augmetin 10/30>>>  HPI/Subjective: No acute events noted overnight  Objective: Filed Vitals:   09/18/14 0644 09/18/14 0918 09/18/14 1004 09/18/14 1035  BP: 165/78 135/72 130/82 126/66  Pulse: 86     Temp: 99.6 F (37.6 C)     TempSrc: Oral     Resp: 16     Height:      Weight:      SpO2: 100%       Intake/Output Summary (Last 24 hours) at 09/18/14 1301 Last data filed at 09/18/14 0644  Gross per 24 hour  Intake 3248.33 ml  Output   1400 ml  Net 1848.33 ml   Filed Weights   09/13/14 2024  Weight: 53.524 kg (118 lb)    Exam:   General:  Awake, in nad  Cardiovascular: regular, s1, s2  Respiratory: coarse, normal resp effort, no wheezing  Abdomen: distended, decreased BS, fluid wave  Musculoskeletal: perfused, no clubbing   Data Reviewed: Basic Metabolic  Panel:  Recent Labs Lab 09/14/14 0502 09/15/14 0525 09/16/14 0510 09/17/14 0550 09/18/14 0543  NA 136* 132* 138 132* 133*  K 3.9 3.8 3.8 4.2 4.1  CL 101 99 104 97 100  CO2 26 25 25 25 24   GLUCOSE 161* 190* 62* 92 84  BUN 11 14 15 14 12   CREATININE 0.58 0.62 0.63 0.61 0.58  CALCIUM 8.1* 8.0* 8.3* 8.4 8.7   Liver Function Tests:  Recent Labs Lab 09/13/14 1857 09/18/14 0543  AST 66* 52*  ALT 20 19  ALKPHOS 664* 689*  BILITOT 2.1* 3.1*  PROT 7.0 6.7  ALBUMIN 2.4* 2.1*    Recent Labs Lab 09/13/14 1857  LIPASE 4*   No results for input(s): AMMONIA in the last 168 hours. CBC:  Recent Labs Lab 09/13/14 1857 09/14/14 0502 09/15/14 0525 09/16/14 0510 09/17/14 0550 09/17/14 1815 09/18/14 0543  WBC 19.3* 11.2* 8.9 8.9 8.9  --  9.9  NEUTROABS 17.7* 10.0*  --   --   --   --   --   HGB 8.1* 7.1* 7.3* 7.3* 6.8* 10.1* 10.0*  HCT 25.3* 22.2* 23.8* 22.6* 21.3* 30.8* 30.9*  MCV 74.9* 74.7* 76.5* 74.6* 75.3*  --  79.4  PLT 358 289 281 293 316  --  275   Cardiac Enzymes: No results for input(s): CKTOTAL, CKMB, CKMBINDEX, TROPONINI in the last 168 hours. BNP (last 3 results) No results for input(s): PROBNP in the last  8760 hours. CBG:  Recent Labs Lab 09/17/14 1717 09/17/14 2155 09/18/14 0749 09/18/14 0855 09/18/14 1159  GLUCAP 153* 233* 67* 76 93    Recent Results (from the past 240 hour(s))  Culture, blood (routine x 2)     Status: None   Collection Time: 09/13/14  6:58 PM  Result Value Ref Range Status   Specimen Description BLOOD RIGHT ANTECUBITAL  Final   Special Requests BOTTLES DRAWN AEROBIC AND ANAEROBIC 5ML  Final   Culture  Setup Time   Final    09/13/2014 22:33 Performed at [facility]   Culture   Final    KLEBSIELLA PNEUMONIAE Note: Gram Stain Report Called to,Read Back By and Verified With: Aldean Baker RN [phone] Performed at [facility]   Report Status 09/16/2014 FINAL  Final   Organism ID, Bacteria KLEBSIELLA PNEUMONIAE   Final      Susceptibility   Klebsiella pneumoniae - MIC*    AMPICILLIN RESISTANT      AMPICILLIN/SULBACTAM 4 SENSITIVE Sensitive     CEFAZOLIN <=4 SENSITIVE Sensitive     CEFEPIME <=1 SENSITIVE Sensitive     CEFTAZIDIME <=1 SENSITIVE Sensitive     CEFTRIAXONE <=1 SENSITIVE Sensitive     CIPROFLOXACIN <=0.25 SENSITIVE Sensitive     GENTAMICIN <=1 SENSITIVE Sensitive     IMIPENEM <=0.25 SENSITIVE Sensitive     PIP/TAZO <=4 SENSITIVE Sensitive     TOBRAMYCIN <=1 SENSITIVE Sensitive     TRIMETH/SULFA <=20 SENSITIVE Sensitive     * KLEBSIELLA PNEUMONIAE  Culture, blood (routine x 2)     Status: None   Collection Time: 09/13/14  6:59 PM  Result Value Ref Range Status   Specimen Description BLOOD RIGHT PORT  Final   Special Requests BOTTLES DRAWN AEROBIC AND ANAEROBIC 5ML  Final   Culture  Setup Time   Final    09/13/2014 22:34 Performed at [facility]   Culture   Final    KLEBSIELLA PNEUMONIAE Note: SUSCEPTIBILITIES PERFORMED ON PREVIOUS CULTURE WITHIN THE LAST 5 DAYS. Note: Gram Stain Report Called to,Read Back By and Verified With: [place] [phone] Performed at [facility]   Report Status 09/16/2014 FINAL  Final  Urine culture     Status: None   Collection Time: 09/13/14  8:46 PM  Result Value Ref Range Status   Specimen Description URINE, CLEAN CATCH  Final   Special Requests NONE  Final   Culture  Setup Time   Final    09/14/2014 01:45 Performed at [place] Performed at [facility]  Final   Culture NO GROWTH Performed at [facility]  Final   Report Status 09/15/2014 FINAL  Final  Clostridium Difficile by PCR     Status: None   Collection Time: 09/15/14 12:27 PM  Result Value Ref Range Status   C difficile by pcr NEGATIVE NEGATIVE Final    Comment: Performed at [facility]     Studies: Dg Abd Portable 1v  09/17/2014   CLINICAL DATA:  Mid abdominal pain  EXAM: PORTABLE ABDOMEN - 1  VIEW  COMPARISON:  CT 08/24/2014  FINDINGS: Common bile duct wall stent noted. No dilated loops of large or small bowel. There is gas and stool in the rectum. Extension of the abdomen with V bowel loops collecting centrally suggests ascites.  IMPRESSION: 1. No evidence of bowel obstruction. 2. Suspected ascites as seen on comparison CT.   Electronically Signed  By: Suzy Bouchard M.D.   On: 09/17/2014 09:11   US Abdomen Limited Ruq  09/17/2014   CLINICAL DATA:  Biliary obstruction, right upper quadrant pain for several weeks. Biliary stent placed December 2014. History pancreatic cancer  EXAM: US ABDOMEN LIMITED - RIGHT UPPER QUADRANT  COMPARISON:  Ultrasound 01/04/2014, CT 08/24/2014  FINDINGS: Gallbladder:  There is dependent sludge versus small stones within the lumen of the gallbladder. Gallbladder wall is mildly thickened to 5 mm. No pericholecystic fluid. There is a moderate volume of intraperitoneal free fluid / ascites which likely contributes to the gallbladder wall thickening. Negative sonographic Murphy's sign.  Common bile duct:  Diameter: The biliary stent in place. Common bile duct measures 7 mm.  Liver:  There is mild intrahepatic biliary duct station on the left or right. No focal hepatic lesions identified. Moderate volume ascites appears increased from CT of 08/24/2014 although different techniques.  IMPRESSION: 1. Moderate volume of ascites a appears increased from CT of 08/24/2014. 2. Gallbladder wall thickening and sludge in the gallbladder. Gallbladder wall thickening is likely secondary to the ascites. No gallbladder distension. Negative sonographic Murphy's sign. 3. Mild intrahepatic biliary duct dilatation is similar to comparison CT.   Electronically Signed   By: Suzy Bouchard M.D.   On: 09/17/2014 10:05    Scheduled Meds: . amoxicillin-clavulanate  1 tablet Oral Q12H  . aspirin EC  81 mg Oral Daily  . feeding supplement (GLUCERNA SHAKE)  237 mL Oral BID PC  . gabapentin   300 mg Oral TID  . insulin aspart  0-15 Units Subcutaneous TID WC  . insulin aspart  0-5 Units Subcutaneous QHS  . insulin glargine  10 Units Subcutaneous QHS  . lipase/protease/amylase  12,000 Units Oral TID AC  . metoCLOPramide  5 mg Oral TID AC  . mirtazapine  15 mg Oral QHS  . pantoprazole  40 mg Oral Daily  . potassium chloride  10 mEq Oral Daily  . simvastatin  10 mg Oral QHS  . sodium chloride  3 mL Intravenous Q12H  . sucralfate  1 g Oral BID   Continuous Infusions: . dextrose 75 mL/hr at 09/18/14 8938    Principal Problem:   Pneumonia Active Problems:   HTN (hypertension)   Dyslipidemia   Pancreatic cancer   Malnutrition of moderate degree  Time spent: 26min  [facility], [place] [facility] Pager [phone]. If 7PM-7AM, please contact night-coverage at [email or web address], password [facility] 09/18/2014, 1:01 PM  LOS: 5 days

## 2014-09-18 NOTE — Progress Notes (Signed)
Hypoglycemic Event  CBG: 67  Treatment: 15 GM carbohydrate snack  Symptoms: None  Follow-up CBG: [id number] CBG Result:76  Possible Reasons for Event: Unknown     Kasper Mudrick M  Remember to initiate Hypoglycemia Order Set & complete

## 2014-09-18 NOTE — Progress Notes (Signed)
Full code.  Related admission.  Patient sitting up in the bed eating bites of breakfast.  Reports right flank pain and reports she has taken pain medication.  Patient reports having a paracentesis this am but isn't able to report if this was helpful.  No family present at the bedside.  Chart reviewed and spoke with Verline Lema, RN for patient.  Please contact [place] with any questions or concerns.  Vance Gather, RN [facility]

## 2014-09-18 NOTE — Procedures (Signed)
Successful US guided paracentesis from RUQ.  Yielded 3.8 liters of clear yellow fluid.  No immediate complications.  Pt tolerated well.   Specimen was sent for labs.  Tsosie Billing D PA-C 09/18/2014 10:31 AM

## 2014-09-19 DIAGNOSIS — C257 Malignant neoplasm of other parts of pancreas: Secondary | ICD-10-CM

## 2014-09-19 DIAGNOSIS — R188 Other ascites: Secondary | ICD-10-CM

## 2014-09-19 LAB — COMPREHENSIVE METABOLIC PANEL
ALK PHOS: 568 U/L — AB (ref 39–117)
ALT: 19 U/L (ref 0–35)
AST: 49 U/L — ABNORMAL HIGH (ref 0–37)
Albumin: 1.9 g/dL — ABNORMAL LOW (ref 3.5–5.2)
Anion gap: 8 (ref 5–15)
BILIRUBIN TOTAL: 1.7 mg/dL — AB (ref 0.3–1.2)
BUN: 8 mg/dL (ref 6–23)
CHLORIDE: 98 meq/L (ref 96–112)
CO2: 27 meq/L (ref 19–32)
Calcium: 8.2 mg/dL — ABNORMAL LOW (ref 8.4–10.5)
Creatinine, Ser: 0.46 mg/dL — ABNORMAL LOW (ref 0.50–1.10)
GFR calc non Af Amer: >90 mL/min (ref >90)
GLUCOSE: 200 mg/dL — AB (ref 70–99)
POTASSIUM: 3.8 meq/L (ref 3.7–5.3)
Sodium: 133 mEq/L — ABNORMAL LOW (ref 137–147)
Total Protein: 6.1 g/dL (ref 6.0–8.3)

## 2014-09-19 LAB — CBC
HEMATOCRIT: 29.3 % — AB (ref 36.0–46.0)
HEMOGLOBIN: 9.5 g/dL — AB (ref 12.0–15.0)
MCH: 26 pg (ref 26.0–34.0)
MCHC: 32.4 g/dL (ref 30.0–36.0)
MCV: 80.1 fL (ref 78.0–100.0)
Platelets: 265 10*3/uL (ref 150–400)
RBC: 3.66 MIL/uL — ABNORMAL LOW (ref 3.87–5.11)
RDW: 18.8 % — ABNORMAL HIGH (ref 11.5–15.5)
WBC: 8.1 10*3/uL (ref 4.0–10.5)

## 2014-09-19 LAB — GLUCOSE, CAPILLARY
Glucose-Capillary: 43 mg/dL — CL (ref 70–99)
Glucose-Capillary: 164 mg/dL — ABNORMAL HIGH (ref 70–99)
Glucose-Capillary: 153 mg/dL — ABNORMAL HIGH (ref 70–99)
Glucose-Capillary: 109 mg/dL — ABNORMAL HIGH (ref 70–99)
Glucose-Capillary: 149 mg/dL — ABNORMAL HIGH (ref 70–99)

## 2014-09-19 NOTE — Progress Notes (Signed)
[facility] Work note LCSW made supportive visit. Patient was asleep, yet awakened to greet LCSW. She shared ongoing sleepiness, feeling tired. She asked what was causing it and answered her own question with understanding that the pneumonia and blood sugar issues are causing. We also discussed her cancer as another reasons she is feeling low. Patient did validate that she was agreeable to going to [facility] and we reviewed Financial Assessment. LCSW informed her that she needed to meet eligibility and she asked what would happen if she did not meet criteria. LCSW informed her that she and family, hospital staff would regroup to discuss options.  LCSW addressed code status and patient indicated that she wanted, "someone to try CPR and then if it didn't work, then let me go". She asked if her decision would keep her out of [facility] and LCSW stated that it may not, however, the role of the facility was comfort, allowing peaceful death. Patient shared ongoing concerns for her son as it is "check time" and we talked about her desires to talk to an attorney. She indicated that she would talk to one when she is discharged. Patient has forgetfulness, some difficulty retaining some information. Support, education offered. LCSW and hospital liaison RN, Santiago Glad discussed patient status and information was sent to [facility]. Dir. RN, Fraser Din offered bed for today with need for clarification regarding code status.  LCSW contacted covering LCSW, Earnest Bailey and left message requesting follow-up regarding discharge to [facility] today. Katherina Right, [place]

## 2014-09-19 NOTE — Progress Notes (Signed)
[facility] PROGRESS NOTE  [name] [name] XNA:[id number] DOB: [birth year] DOA: 09/13/2014 PCP: Philis Fendt, MD  Assessment/Plan: 1. HCAP with UTI and Klebsiella bacteremia and SBP with sepsis 1. Initially had been cont on vanc and cefepime 2. Ordered flutter valve for secretions 3. Presenting leukocytosis resolved 4. Transitioned to po augmentin to complete course 5. Ascites noted on exam and on imaging 6. Pt is s/p diagnostic/therapeutic paracentesis with absolute PMN of 270 7. Cont augmentin 2. HTN 1. BP stable and controlled 2. Cont monitor 3. DM 1. Pt not tolerating much PO 2. Holding scheduled pre-meal insulin 3. CBG stable 4. Decreased lantus to 8 units 5. Cont SSI coverage 4. Pancreatic cancer 1. Followed by oncology 2. Discussed with Oncology over the phone on 10/30 (Dr. Benay Spice) 3. Unfortunate case. Plans for [facility] SW consulted 4. Discussed case with Dr. Benay Spice on 11/2. Plans for repeat paracentesis, possibly on 11/3 for continued malignant ascites 5. HLD 1. On statin 6. DVT prophylaxis 1. SCD's  Code Status: Full Family Communication: Pt in room Disposition Plan: Pending d/c to residential hospice ([facility])   Consultants:    Procedures:    Antibiotics:  Vanc 10/27>>>10/30  Cefepime 10/27>>> 10/30  Augmetin 10/30>>>  HPI/Subjective: Pt eager to leave hospital. No acute events noted.  Objective: Filed Vitals:   09/18/14 1035 09/18/14 2023 09/19/14 0425 09/19/14 1419  BP: 126/66 167/71 134/64 150/78  Pulse:  90 74 79  Temp:  99 F (37.2 C) 99 F (37.2 C) 98.1 F (36.7 C)  TempSrc:  Oral Oral Oral  Resp:  18 18 18   Height:      Weight:      SpO2:  93% 96% 97%    Intake/Output Summary (Last 24 hours) at 09/19/14 1738 Last data filed at 09/19/14 1300  Gross per 24 hour  Intake   2521 ml  Output   2500 ml  Net     21 ml   Filed Weights   09/13/14 2024  Weight: 53.524 kg (118 lb)    Exam:   General:  Awake,  in nad  Cardiovascular: regular, s1, s2  Respiratory: coarse, normal resp effort, no wheezing  Abdomen: distended, improved overall, pos BS  Musculoskeletal: perfused, no clubbing   Data Reviewed: Basic Metabolic Panel:  Recent Labs Lab 09/15/14 0525 09/16/14 0510 09/17/14 0550 09/18/14 0543 09/19/14 0515  NA 132* 138 132* 133* 133*  K 3.8 3.8 4.2 4.1 3.8  CL 99 104 97 100 98  CO2 25 25 25 24 27   GLUCOSE 190* 62* 92 84 200*  BUN 14 15 14 12 8   CREATININE 0.62 0.63 0.61 0.58 0.46*  CALCIUM 8.0* 8.3* 8.4 8.7 8.2*   Liver Function Tests:  Recent Labs Lab 09/13/14 1857 09/18/14 0543 09/19/14 0515  AST 66* 52* 49*  ALT 20 19 19   ALKPHOS 664* 689* 568*  BILITOT 2.1* 3.1* 1.7*  PROT 7.0 6.7 6.1  ALBUMIN 2.4* 2.1* 1.9*    Recent Labs Lab 09/13/14 1857  LIPASE 4*   No results for input(s): AMMONIA in the last 168 hours. CBC:  Recent Labs Lab 09/13/14 1857 09/14/14 0502 09/15/14 0525 09/16/14 0510 09/17/14 0550 09/17/14 1815 09/18/14 0543 09/19/14 0515  WBC 19.3* 11.2* 8.9 8.9 8.9  --  9.9 8.1  NEUTROABS 17.7* 10.0*  --   --   --   --   --   --   HGB 8.1* 7.1* 7.3* 7.3* 6.8* 10.1* 10.0* 9.5*  HCT 25.3* 22.2* 23.8* 22.6*  21.3* 30.8* 30.9* 29.3*  MCV 74.9* 74.7* 76.5* 74.6* 75.3*  --  79.4 80.1  PLT 358 289 281 293 316  --  275 265   Cardiac Enzymes: No results for input(s): CKTOTAL, CKMB, CKMBINDEX, TROPONINI in the last 168 hours. BNP (last 3 results) No results for input(s): PROBNP in the last 8760 hours. CBG:  Recent Labs Lab 09/18/14 1800 09/18/14 2144 09/19/14 0753 09/19/14 1142 09/19/14 1720  GLUCAP 121* 160* 164* 153* 109*    Recent Results (from the past 240 hour(s))  Culture, blood (routine x 2)     Status: None   Collection Time: 09/13/14  6:58 PM  Result Value Ref Range Status   Specimen Description BLOOD RIGHT ANTECUBITAL  Final   Special Requests BOTTLES DRAWN AEROBIC AND ANAEROBIC 5ML  Final   Culture  Setup Time   Final     09/13/2014 22:33 Performed at [facility]   Culture   Final    KLEBSIELLA PNEUMONIAE Note: Gram Stain Report Called to,Read Back By and Verified With: Aldean Baker RN [phone] Performed at [facility]   Report Status 09/16/2014 FINAL  Final   Organism ID, Bacteria KLEBSIELLA PNEUMONIAE  Final      Susceptibility   Klebsiella pneumoniae - MIC*    AMPICILLIN RESISTANT      AMPICILLIN/SULBACTAM 4 SENSITIVE Sensitive     CEFAZOLIN <=4 SENSITIVE Sensitive     CEFEPIME <=1 SENSITIVE Sensitive     CEFTAZIDIME <=1 SENSITIVE Sensitive     CEFTRIAXONE <=1 SENSITIVE Sensitive     CIPROFLOXACIN <=0.25 SENSITIVE Sensitive     GENTAMICIN <=1 SENSITIVE Sensitive     IMIPENEM <=0.25 SENSITIVE Sensitive     PIP/TAZO <=4 SENSITIVE Sensitive     TOBRAMYCIN <=1 SENSITIVE Sensitive     TRIMETH/SULFA <=20 SENSITIVE Sensitive     * KLEBSIELLA PNEUMONIAE  Culture, blood (routine x 2)     Status: None   Collection Time: 09/13/14  6:59 PM  Result Value Ref Range Status   Specimen Description BLOOD RIGHT PORT  Final   Special Requests BOTTLES DRAWN AEROBIC AND ANAEROBIC 5ML  Final   Culture  Setup Time   Final    09/13/2014 22:34 Performed at [facility]   Culture   Final    KLEBSIELLA PNEUMONIAE Note: SUSCEPTIBILITIES PERFORMED ON PREVIOUS CULTURE WITHIN THE LAST 5 DAYS. Note: Gram Stain Report Called to,Read Back By and Verified With: [place] [phone] Performed at [facility]   Report Status 09/16/2014 FINAL  Final  Urine culture     Status: None   Collection Time: 09/13/14  8:46 PM  Result Value Ref Range Status   Specimen Description URINE, CLEAN CATCH  Final   Special Requests NONE  Final   Culture  Setup Time   Final    09/14/2014 01:45 Performed at [place] Performed at [facility]  Final   Culture NO GROWTH Performed at [facility]  Final   Report Status 09/15/2014 FINAL  Final  Clostridium  Difficile by PCR     Status: None   Collection Time: 09/15/14 12:27 PM  Result Value Ref Range Status   C difficile by pcr NEGATIVE NEGATIVE Final    Comment: Performed at [facility]  Body fluid culture     Status: None (Preliminary result)   Collection Time: 09/18/14 11:03 AM  Result Value Ref Range Status   Specimen Description ASCITIC  Final   Special Requests NONE  Final   Gram Stain   Final    NO WBC SEEN NO ORGANISMS SEEN Performed at [facility]    Culture NO GROWTH Performed at [facility]   Final   Report Status PENDING  Incomplete     Studies: US Paracentesis  09/18/2014   INDICATION: History of pancreatic cancer, ascites, request for paracentesis.  EXAM: ULTRASOUND-GUIDED PARACENTESIS  COMPARISON:  None.  MEDICATIONS: None.  COMPLICATIONS: None immediate  TECHNIQUE: Informed written consent was obtained from the patient after a discussion of the risks, benefits and alternatives to treatment. A timeout was performed prior to the initiation of the procedure.  Initial ultrasound scanning demonstrates a large amount of ascites within the right upper abdominal quadrant. The right upper abdomen was prepped and draped in the usual sterile fashion. 1% lidocaine was used for local anesthesia. Under direct ultrasound guidance, a 19 gauge, 7-cm, Yueh catheter was introduced. An ultrasound image was saved for documentation purposed. The paracentesis was performed. The catheter was removed and a dressing was applied. The patient tolerated the procedure well without immediate post procedural complication.  FINDINGS: A total of approximately 3.8 liters of serous fluid was removed. Samples were sent to the laboratory as requested by the clinical team.  IMPRESSION: Successful ultrasound-guided paracentesis yielding 3.8 liters of peritoneal fluid.  Read By:  Tsosie Billing PA-C   Electronically Signed   By: Aletta Edouard M.D.   On: 09/18/2014 10:34    Scheduled Meds: .  amoxicillin-clavulanate  1 tablet Oral Q12H  . aspirin EC  81 mg Oral Daily  . feeding supplement (GLUCERNA SHAKE)  237 mL Oral BID PC  . gabapentin  300 mg Oral TID  . insulin aspart  0-15 Units Subcutaneous TID WC  . insulin aspart  0-5 Units Subcutaneous QHS  . insulin glargine  8 Units Subcutaneous QHS  . lipase/protease/amylase  12,000 Units Oral TID AC  . metoCLOPramide  5 mg Oral TID AC  . mirtazapine  15 mg Oral QHS  . pantoprazole  40 mg Oral Daily  . potassium chloride  10 mEq Oral Daily  . simvastatin  10 mg Oral QHS  . sodium chloride  3 mL Intravenous Q12H  . sucralfate  1 g Oral BID   Continuous Infusions: . dextrose 10 mL/hr at 09/19/14 1412    Principal Problem:   Pneumonia Active Problems:   HTN (hypertension)   Dyslipidemia   Pancreatic cancer   Malnutrition of moderate degree  Time spent: 60min  Shawon Denzer, [facility] Pager [phone]. If 7PM-7AM, please contact night-coverage at [email or web address], password [facility] 09/19/2014, 5:38 PM  LOS: 6 days

## 2014-09-19 NOTE — Progress Notes (Signed)
IP PROGRESS NOTE  Subjective:  She states that she is ready to go to [facility]. She continues to have abdominal pain. The abdomen is again distended after undergoing a paracentesis 09/18/2014 Objective: Vital signs in last 24 hours: Blood pressure 134/64, pulse 74, temperature 99 F (37.2 C), temperature source Oral, resp. rate 18, height 5\' 2"  (1.575 m), weight 118 lb (53.524 kg), SpO2 96 %.  Intake/Output from previous day: 11/01 0701 - 11/02 0700 In: 2281 [P.O.:480; I.V.:1801] Out: 1900 [Urine:1900]  Physical Exam:  Abdomen: Moderately distended, no mass Vascular: No leg edema  Portacath/PICC-without erythema  Lab Results:  Recent Labs  09/18/14 0543 09/19/14 0515  WBC 9.9 8.1  HGB 10.0* 9.5*  HCT 30.9* 29.3*  PLT 275 265    BMET  Recent Labs  09/18/14 0543 09/19/14 0515  NA 133* 133*  K 4.1 3.8  CL 100 98  CO2 24 27  GLUCOSE 84 200*  BUN 12 8  CREATININE 0.58 0.46*  CALCIUM 8.7 8.2*  Alkaline phosphatase 568, bilirubin 1.7 on 09/19/2014  Studies/Results: US Paracentesis  09/18/2014   INDICATION: History of pancreatic cancer, ascites, request for paracentesis.  EXAM: ULTRASOUND-GUIDED PARACENTESIS  COMPARISON:  None.  MEDICATIONS: None.  COMPLICATIONS: None immediate  TECHNIQUE: Informed written consent was obtained from the patient after a discussion of the risks, benefits and alternatives to treatment. A timeout was performed prior to the initiation of the procedure.  Initial ultrasound scanning demonstrates a large amount of ascites within the right upper abdominal quadrant. The right upper abdomen was prepped and draped in the usual sterile fashion. 1% lidocaine was used for local anesthesia. Under direct ultrasound guidance, a 19 gauge, 7-cm, Yueh catheter was introduced. An ultrasound image was saved for documentation purposed. The paracentesis was performed. The catheter was removed and a dressing was applied. The patient tolerated the procedure well  without immediate post procedural complication.  FINDINGS: A total of approximately 3.8 liters of serous fluid was removed. Samples were sent to the laboratory as requested by the clinical team.  IMPRESSION: Successful ultrasound-guided paracentesis yielding 3.8 liters of peritoneal fluid.  Read By:  Tsosie Billing PA-C   Electronically Signed   By: Aletta Edouard M.D.   On: 09/18/2014 10:34    Medications: I have reviewed the patient's current medications.  Assessment/Plan:  1. 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  Initiation of radiation and Xeloda 07/20/2013 with completion 08/09/2013.  CT scans abdomen/pelvis done 11/01/2013 for evaluation of increased abdominal pain showed stable infiltrating pancreatic mass. No progression identified. Stable peripancreatic lymph nodes. 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 No evidence of metastatic disease.  Pancreatic head mass stable on CT 01/03/2014 and 03/07/2014.  CA 19-9 736 on 05/24/2014.  Initiation of gemcitabine/Abraxane on a 2 week schedule 05/24/2014.  CA 19-9 improved on 07/06/2014.  CT scan 08/02/2014 (comparison scan 03/07/2014) showed a new hypervascular mass measuring 5 cm by 6 cm in the right hepatic lobe. 2. Obstructive jaundice secondary to #1. Status post placement of a bile duct stent on 05/24/2013. Status post placement of a metal stent 10/21/2013.the bilirubin and alkaline phosphatase remain elevated 3. Pain -likely secondary to pancreas cancer, no improvement following the celiac plexus nerve block 02/10/2014. She continues Oxycodone and hydrocodone. 4. Diabetes. 5. History of chronic pancreatitis. 6. History of alcohol use. 7. History of tobacco use. 8. Anxiety/depression.  9. History of a Cutaneous nodule at the left flank. Question cyst, question  metastasis. 10. Abnormal EKG, question Brugada syndrome. 11. Hospitalization 08/12/2013 through 08/15/2013 with nausea/vomiting and abdominal pain. Improved. 12. 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 Portal vein patent. New wall thickening of the mid to distal stomach and the hepatic flexure and proximal transverse colon. 13. 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 She discontinued Lovenox due to abdominal wall ecchymoses and hematomas. 14. Admission 12/22/2013 with strep viridans bacteremia status post 4 week course of ceftriaxone.  15. Status post celiac plexus nerve block 02/10/2014.  16. 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 She started Pancrease after an office visit 02/07/2014. She continues to have intermittent diarrhea. She does not take the pancreatic enzyme replacement consistently. 17. Elevated liver enzymes. Possibly related to a biliary stent obstruction 18. Admission 04/09/2014 with Escherichia coli and fungal bacteremia, completed outpatient ceftriaxone and micafungin. 19. Hypokalemia. She is on a potassium supplement. 20. Status post Port-A-Cath placement 06/14/2014. 21. Anorexia, depression. Remeron initiated 06/20/2014. It is not clear that she is taking Remeron. 22. Fever/gram-negative bacteremia, Klebsiella pneumoniae-most likely secondary to a biliary infection 23. Ascites, status post a paracentesis 09/19/1999 pain, likely malignant ascites   Ms. Bastien has advanced pancreas cancer. She has a poor performance status. I discussed the case with her daughter last week and she felt Ms. Record could be cared for in the home. Ms. Magnussen states today that she would like to consider transfer to [facility]  place.  Recommendations: 1. Continue antibiotics per the medicine service, GI consultation to evaluate the biliary stent 2. continue Roxanol and oxycodone for pain 3. consult care management and hospice Social worker to meet with the daughter and patient to discuss disposition plans 4. consolidate medications for comfort care 5. Discontinue IV fluids, arrange for repeat palliative paracentesis as needed  I will contact her daughter to discuss the case again. I recommend [facility] if Ms. Quintanilla is agreeable.    LOS: 6 days   Teddie Mehta  09/19/2014, 2:09 PM

## 2014-09-19 NOTE — Progress Notes (Signed)
Clinical Social Work Department BRIEF PSYCHOSOCIAL ASSESSMENT 09/19/2014  Patient:  [name] [name], [name] [name]     Account Number:  [id number]     Admit date:  09/13/2014  Clinical Social Worker:  Earlie Server  Date/Time:  09/19/2014 10:45 AM  Referred by:  Physician  Date Referred:  09/19/2014 Referred for  Residential hospice placement   Other Referral:   Interview type:  Family Other interview type:   Hospice RN    PSYCHOSOCIAL DATA Living Status:  FAMILY Admitted from facility:   Level of care:   Primary support name:  [name] Primary support relationship to patient:  CHILD, ADULT Degree of support available:   Strong    CURRENT CONCERNS Current Concerns  Post-Acute Placement   Other Concerns:    SOCIAL WORK ASSESSMENT / PLAN CSW received referral to assist with DC planning. CSW spoke with bedside RN, MD, and hospice RN. CSW received a call from dtr as well and discussed plans with her.    Patient was living at home with hospice services prior to admission. Attending MD reports that patient will DC to [facility] at [place] spoke with hospice RN who reports she is aware of [facility] needs and will discuss case with hospice SW as well. CSW spoke with dtr who reports that she is aware of DC to [facility] and wants to be updated at DC.    CSW will continue to follow.   Assessment/plan status:  Psychosocial Support/Ongoing Assessment of Needs Other assessment/ plan:   Information/referral to community resources:   [facility] referral    PATIENT'S/FAMILY'S RESPONSE TO PLAN OF CARE: Patient's dtr engaged in assessment and reports she wants to be active in patient's DC planning. Dtr reports that she feels that placement at [facility] is best for patient. Hospice RN reports that Hospice SW will contact CSW to discuss DC plans as well.       [name], LCSW  (Coverage for [employer])

## 2014-09-19 NOTE — Progress Notes (Signed)
Inpatient RN visit- [place] 3W Room 1331-[facility] RN Visit-[name] RN  Related admission to [facility] diagnosis of Pancreatic Ca Pt is FULL  .    Pt seen at bedside, sitting up in bed, breakfast tray in place on over bed table. Pt expressing her frustration to staff RN about needing help with her tray and feeling that no one was responding fast enough. Calmed with gentle reassurance and active listening. She did eat her toast, a few bites of oatmeal and take her po medications during visit. Pt reports that Dr. Benay Spice had been in to see her earlier and there was some discussion about a pleurex drain being placed in her abdomen. Per chart review she did have a paracentesis yesterday and had 3.8L of fluid drained.  Pt also reports that the current discharge plan is for her to go to [facility]. Both confirmed with staff RN Hoyle Sauer. Dr. Gearldine Shown EPIC note not yet posted. Current plan relayed to [place] through vm. Information shared with staff SW [name]. No family present during visit.  Patient's home medication list and transfer summary in place on shadow chart.  Please call [facility] @ [phone] with any hospice needs.   Thank you. [name], RN  [facility]  Hospice Liaison  [phone])

## 2014-09-19 NOTE — Progress Notes (Signed)
Clinical Social Work  CSW spoke with Hospice SW [place]) who reports that patient has a bed at [facility] available today. CSW text paged MD re: DC plans. CSW will continue to follow.  [name], LCSW (Coverage for [employer])

## 2014-09-20 ENCOUNTER — Ambulatory Visit: Payer: Medicaid Other | Admitting: Nurse Practitioner

## 2014-09-20 DIAGNOSIS — E440 Moderate protein-calorie malnutrition: Secondary | ICD-10-CM

## 2014-09-20 LAB — LEGIONELLA ANTIGEN, URINE

## 2014-09-20 LAB — GLUCOSE, CAPILLARY: GLUCOSE-CAPILLARY: 160 mg/dL — AB (ref 70–99)

## 2014-09-20 MED ORDER — HEPARIN SOD (PORK) LOCK FLUSH 100 UNIT/ML IV SOLN
500.0000 [IU] | INTRAVENOUS | Status: DC | PRN
  Filled 2014-09-20: qty 5

## 2014-09-20 MED ORDER — AMOXICILLIN-POT CLAVULANATE 875-125 MG PO TABS: 1.0000 | ORAL_TABLET | Freq: Two times a day (BID) | ORAL | Status: AC

## 2014-09-20 MED ORDER — INSULIN GLARGINE 100 UNIT/ML ~~LOC~~ SOLN: 8.0000 [IU] | Freq: Every day | SUBCUTANEOUS | Status: AC

## 2014-09-20 MED ORDER — MORPHINE SULFATE (CONCENTRATE) 10 MG/0.5ML PO SOLN: 40.0000 mg | ORAL | Status: AC | PRN

## 2014-09-20 NOTE — Progress Notes (Signed)
PT Cancellation Note  Patient Details Name: [name] [name] MRN: [id number] DOB: January 13, 1956   Cancelled Treatment:    Reason Eval/Treat Not Completed: Other (comment) (noted that patient going to [facility] )   [name] 09/20/2014, 7:43 AM [name] PT [phone]

## 2014-09-20 NOTE — Progress Notes (Addendum)
IP PROGRESS NOTE  Subjective:  She had "sweats "last night. She continues to have abdominal pain relieved with Roxanol.  Objective: Vital signs in last 24 hours: Blood pressure 122/74, pulse 80, temperature 98.9 F (37.2 C), temperature source Oral, resp. rate 16, height 5\' 2"  (1.575 m), weight 118 lb (53.524 kg), SpO2 96 %.  Intake/Output from previous day: 11/02 0701 - 11/03 0700 In: 4 [P.O.:480] Out: 925 [Urine:925]  Physical Exam: Lungs: Inspiratory bronchial sounds bilaterally, no respiratory distress Cardiac: Regular rate and rhythm Abdomen: mildly distended, no mass Vascular: No leg edema  Portacath/PICC-without erythema  Lab Results:  Recent Labs  09/18/14 0543 09/19/14 0515  WBC 9.9 8.1  HGB 10.0* 9.5*  HCT 30.9* 29.3*  PLT 275 265    BMET  Recent Labs  09/18/14 0543 09/19/14 0515  NA 133* 133*  K 4.1 3.8  CL 100 98  CO2 24 27  GLUCOSE 84 200*  BUN 12 8  CREATININE 0.58 0.46*  CALCIUM 8.7 8.2*  Alkaline phosphatase 568, bilirubin 1.7 on 09/19/2014  Studies/Results: US Paracentesis  09/18/2014   INDICATION: History of pancreatic cancer, ascites, request for paracentesis.  EXAM: ULTRASOUND-GUIDED PARACENTESIS  COMPARISON:  None.  MEDICATIONS: None.  COMPLICATIONS: None immediate  TECHNIQUE: Informed written consent was obtained from the patient after a discussion of the risks, benefits and alternatives to treatment. A timeout was performed prior to the initiation of the procedure.  Initial ultrasound scanning demonstrates a large amount of ascites within the right upper abdominal quadrant. The right upper abdomen was prepped and draped in the usual sterile fashion. 1% lidocaine was used for local anesthesia. Under direct ultrasound guidance, a 19 gauge, 7-cm, Yueh catheter was introduced. An ultrasound image was saved for documentation purposed. The paracentesis was performed. The catheter was removed and a dressing was applied. The patient tolerated the  procedure well without immediate post procedural complication.  FINDINGS: A total of approximately 3.8 liters of serous fluid was removed. Samples were sent to the laboratory as requested by the clinical team.  IMPRESSION: Successful ultrasound-guided paracentesis yielding 3.8 liters of peritoneal fluid.  Read By:  Tsosie Billing PA-C   Electronically Signed   By: Aletta Edouard M.D.   On: 09/18/2014 10:34    Medications: I have reviewed the patient's current medications.  Assessment/Plan:  1. 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  Initiation of radiation and Xeloda 07/20/2013 with completion 08/09/2013.  CT scans abdomen/pelvis done 11/01/2013 for evaluation of increased abdominal pain showed stable infiltrating pancreatic mass. No progression identified. Stable peripancreatic lymph nodes. 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 No evidence of metastatic disease.  Pancreatic head mass stable on CT 01/03/2014 and 03/07/2014.  CA 19-9 736 on 05/24/2014.  Initiation of gemcitabine/Abraxane on a 2 week schedule 05/24/2014.  CA 19-9 improved on 07/06/2014.  CT scan 08/02/2014 (comparison scan 03/07/2014) showed a new hypervascular mass measuring 5 cm by 6 cm in the right hepatic lobe. 2. Obstructive jaundice secondary to #1. Status post placement of a bile duct stent on 05/24/2013. Status post placement of a metal stent 10/21/2013.the bilirubin and alkaline phosphatase remain elevated 3. Pain -likely secondary to pancreas cancer, no improvement following the celiac plexus nerve block 02/10/2014. She continues Oxycodone and Roxanol 4. Diabetes. 5. History of chronic pancreatitis. 6. History of alcohol use. 7. History of tobacco use. 8. Anxiety/depression. 9.  History of a Cutaneous nodule at the left flank. Question cyst, question  metastasis. 10. Abnormal EKG, question Brugada syndrome. 11. Hospitalization 08/12/2013 through 08/15/2013 with nausea/vomiting and abdominal pain. Improved. 12. 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 Portal vein patent. New wall thickening of the mid to distal stomach and the hepatic flexure and proximal transverse colon. 13. 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 She discontinued Lovenox due to abdominal wall ecchymoses and hematomas. 14. Admission 12/22/2013 with strep viridans bacteremia status post 4 week course of ceftriaxone.  15. Status post celiac plexus nerve block 02/10/2014.  16. 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 She started Pancrease after an office visit 02/07/2014. She continues to have intermittent diarrhea. She does not take the pancreatic enzyme replacement consistently. 17. Elevated liver enzymes. Possibly related to a biliary stent obstruction 18. Admission 04/09/2014 with Escherichia coli and fungal bacteremia, completed outpatient ceftriaxone and micafungin. 19. Hypokalemia. She is on a potassium supplement. 20. Status post Port-A-Cath placement 06/14/2014. 21. Anorexia, depression. Remeron initiated 06/20/2014.  22. Fever/gram-negative bacteremia, Klebsiella pneumoniae-most likely secondary to a biliary infection, persistent low-grade fever 23. Ascites, status post a paracentesis 09/18/2014, likely malignant ascites, culture negative   Ms. Yohe appears stable. We discussed the prognosis and disposition plans. The goal is comfort care.She agrees to a transfer to [facility]. I discussed CPR and ACLS issues with Ms. Mallie Mussel.she agrees to a no CODE BLUE status. I discussed the situation with her  daughter by telephone this morning and she is in agreement.the low-grade fever last night may be related to "tumor fever "or persistent infection. I would continue oral antibiotics at discharge.  Recommendations: 1. Continue antibiotics per the medicine service 2. continue Roxanol for pain 3. Transfer to [facility] today 4. Discontinue insulin and consolidate medications for comfort care 5. Outpatient paracentesis can be scheduled as needed for comfort  I will follow Mr. Mallie Mussel with the Hospice team at [facility].    LOS: 7 days   [place]  09/20/2014, 8:20 AM

## 2014-09-20 NOTE — Progress Notes (Signed)
Inpatient RN visit- [place] 3W Room 1345-[facility] RN Visit-[name] RN  Related admission to [facility] diagnosis of Pancreatic Ca. Pt is now DNR code.  Pt seen at bed side, sitting up in bed eating her snack. Pt interactive and talkative, able to state d/c plan for transfer to [facility] for comfort care. She also relayed the conversation she had with Dr. Benay Spice about not being "reserected". Pt easily loses her train of thought through out conversation and expressed frustration her with this. Pt denied pain. Foley draining reddish colored urine. Sclera noted to have yellow tinge.  Several family members in pt room during visit. All family present in agreement and understanding of current discharge plan. Staff RN Hoyle Sauer to call report to [facility]) prior to discharge. Pt will need signed GOLD DNR form for transport. LCSW Suzanna aware.  Patient's home medication list and transfer summary in place on shadow chart.  Please call [facility] @ [phone]-  with any hospice needs.   Thank you. [name], RN  [facility]  Hospice Liaison  [phone])

## 2014-09-20 NOTE — Progress Notes (Signed)
Pt for discharge to [facility].  CSW facilitated pt discharge needs including contacting facility, faxing pt discharge information to [facility], discussing with pt and pt daughter at bedside, other family members present at this time, providing RN phone number to call report, and arranging ambulance transport via [place] for pt to [facility].   Pt coping appropriately with transition to [facility]. Pt family plans to meet pt over there once pt arrives.   No further social work needs identified at this time.  CSW signing off.   Alison Murray, MSW, [place] Work [phone]

## 2014-09-20 NOTE — Progress Notes (Signed)
Patient was stable at time of discharge. I deaccessed her port. I reported off to RN at [facility].

## 2014-09-20 NOTE — Discharge Summary (Signed)
Physician Discharge Summary  [name] [name] YIR:[id number] DOB: Apr 08, 1956 DOA: 09/13/2014  PCP: Philis Fendt, MD  Admit date: 09/13/2014 Discharge date: 09/20/2014  Time spent: 35 minutes  Recommendations for Outpatient Follow-up:  1. Follow up with PCP on an as needed basis 2. Outpatient therapeutic paracentesis on an as needed basis  Discharge Diagnoses:  Principal Problem:   Pneumonia Active Problems:   HTN (hypertension)   Dyslipidemia   Pancreatic cancer   Malnutrition of moderate degree   Discharge Condition: Improved  Diet recommendation: Regular  Filed Weights   09/13/14 2024  Weight: 53.524 kg (118 lb)    History of present illness:  Please see admit h and p from 10/27 for details. Briefly, pt presents with fevers, cough, and sob. CXR with findings of pneumonia  Hospital Course:  1. HCAP with UTI and Klebsiella bacteremia and SBP with sepsis 1. Had been cont on vanc and cefepime 2. On flutter valve for secretions 3. Presenting leukocytosis resolved 4. Transitioned to po augmentin to complete course 5. Ascites noted on exam and on imaging 6. Pt is s/p diagnostic/therapeutic paracentesis with absolute PMN of 270 7. Cont augmentin to complete course 2. HTN 1. BP remained stable and controlled 3. DM 1. Pt not tolerating much PO 2. Holding scheduled pre-meal insulin 3. CBG stable with lantus decreased to 8 units 4. Pancreatic cancer 1. Followed by oncology (Dr. Benay Spice) 2. Unfortunate case. Plans for [facility] 3. Plans for repeat paracentesis as outpatient on as needed basis 5. HLD 1. On statin 6. DVT prophylaxis 1. SCD's while inpt  Consultations:  Oncology  Discharge Exam: Filed Vitals:   09/19/14 1908 09/19/14 2155 09/19/14 2235 09/20/14 0632  BP:  116/64  122/74  Pulse:  109  80  Temp: 100.4 F (38 C) 100.7 F (38.2 C) 100.2 F (37.9 C) 98.9 F (37.2 C)  TempSrc: Oral Oral Oral Oral  Resp:  20  16  Height:      Weight:      SpO2:   93%  96%    General: Awake, in nad Cardiovascular: regular, s1, s2 Respiratory: normal resp effort, no wheezing  Discharge Instructions     Medication List    STOP taking these medications        amLODipine 10 MG tablet  Commonly known as:  NORVASC     insulin aspart 100 UNIT/ML injection  Commonly known as:  novoLOG     lisinopril 40 MG tablet  Commonly known as:  PRINIVIL,ZESTRIL     metoCLOPramide 5 MG tablet  Commonly known as:  REGLAN     metoprolol tartrate 25 MG tablet  Commonly known as:  LOPRESSOR     mirtazapine 15 MG tablet  Commonly known as:  REMERON     potassium chloride 10 MEQ tablet  Commonly known as:  K-DUR,KLOR-CON     PRESCRIPTION MEDICATION     simethicone 125 MG chewable tablet  Commonly known as:  MYLICON     simvastatin 10 MG tablet  Commonly known as:  ZOCOR     sucralfate 1 GM/10ML suspension  Commonly known as:  CARAFATE      TAKE these medications        amoxicillin-clavulanate 875-125 MG per tablet  Commonly known as:  AUGMENTIN  Take 1 tablet by mouth every 12 (twelve) hours.     aspirin EC 81 MG tablet  Take 81 mg by mouth daily.     cetirizine 10 MG tablet  Commonly known as:  ZYRTEC  Take 10 mg by mouth daily as needed for allergies.     gabapentin 300 MG capsule  Commonly known as:  NEURONTIN  Take 300 mg by mouth 3 (three) times daily.     insulin glargine 100 UNIT/ML injection  Commonly known as:  LANTUS  Inject 0.08 mLs (8 Units total) into the skin at bedtime.     lidocaine-prilocaine cream  Commonly known as:  EMLA  Apply 1 application topically as needed (porta cath).     lipase/protease/amylase 12000 UNITS Cpep capsule  Commonly known as:  CREON  Take 1 capsule (12,000 Units total) by mouth 3 (three) times daily before meals.     morphine CONCENTRATE 10 MG/0.5ML Soln concentrated solution  Take 2 mLs (40 mg total) by mouth every 3 (three) hours as needed for severe pain.     ondansetron 8 MG  disintegrating tablet  Commonly known as:  ZOFRAN-ODT  Take 8 mg by mouth every 8 (eight) hours as needed for nausea or vomiting (nausea & vomiting).     Oxycodone HCl 10 MG Tabs  Take 10 mg by mouth every 4 (four) hours as needed (severe pain).     pantoprazole 40 MG tablet  Commonly known as:  PROTONIX  Take 40 mg by mouth daily.     polyethylene glycol packet  Commonly known as:  MIRALAX / GLYCOLAX  Take 17 g by mouth daily as needed for mild constipation (constipation).       Allergies  Allergen Reactions  . Betadine [Povidone Iodine] Itching and Rash   Follow-up Information    Follow up with Philis Fendt, MD.   Specialty:  Internal Medicine   Why:  As needed   Contact information:   [place] [address] [phone]        The results of significant diagnostics from this hospitalization (including imaging, microbiology, ancillary and laboratory) are listed below for reference.    Significant Diagnostic Studies: Dg Chest 2 View  09/13/2014   CLINICAL DATA:  Fever and weakness, history of pancreatic carcinoma  EXAM: CHEST  2 VIEW  COMPARISON:  04/09/2014  FINDINGS: Cardiac shadow is within normal limits. The lungs are well aerated bilaterally. Mild left basilar atelectasis is seen. A right jugular chest wall port is noted in satisfactory position. Degenerative change of thoracic spine is noted.  IMPRESSION: Minimal left basilar atelectasis. No other focal abnormality is noted.   Electronically Signed   By: Inez Catalina M.D.   On: 09/13/2014 19:00   Ct Abdomen Pelvis W Contrast  08/24/2014   CLINICAL DATA:  Initial encounter for progressive pain. Pancreatic cancer. Ongoing chemotherapy.  EXAM: CT ABDOMEN AND PELVIS WITH CONTRAST  TECHNIQUE: Multidetector CT imaging of the abdomen and pelvis was performed using the standard protocol following bolus administration of intravenous contrast.  CONTRAST:  178mL OMNIPAQUE IOHEXOL 300 MG/ML  SOLN  COMPARISON:   CT abdomen and pelvis 08/02/2014.  FINDINGS: Mild dependent atelectasis is evident at the left base. The heart size is normal. Coronary artery calcifications are again seen.  There is geographic opacification scratch the there is geographic enhancement of the liver compatible of perfusion defects. 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 The biliary drain is in place. Pneumobilia is again seen. The spleen is within normal limits. A splenule is evident.  The stomach and duodenum are stable. The common bile duct and gallbladder are normal. Pancreatic ductal dilation is stable.  The adrenal glands are normal bilaterally. The left renal cyst is stable. Kidneys and ureters are otherwise within normal limits. Rectosigmoid colon is within normal limits. The remainder of the colon is within normal limits.  Contrast is present within central bowel loops. There is a transition point in the mid abdomen on image 36 of series 2 the distal small bowel is collapsed. Gas and stool present throughout colon. Moderate generalized ascites is similar to the prior exam.  Atherosclerotic calcifications are present in the aorta and branch vessels. Uterus and adnexa are stable. A calcified fibroid is again seen. Prominent gonadal veins are present bilaterally.  Bone windows dense per 8 endplate changes and Schmorl's nodes at L2-3. There is chronic loss of disc height in the lower thoracic spine. No focal lytic or blastic lesions are evident.  IMPRESSION: 1. Contrast mostly within central bowel loops with partial collapse of the distal small bowel but gas and stool throughout the colon. This may represent a functional ileus. 2. Stable appearance of moderate ascites. 3. 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 4. Biliary stent is stable. 5. Stable appearance of the pancreas.    Electronically Signed   By: Lawrence Santiago M.D.   On: 08/24/2014 20:11   US Paracentesis  09/18/2014   INDICATION: History of pancreatic cancer, ascites, request for paracentesis.  EXAM: ULTRASOUND-GUIDED PARACENTESIS  COMPARISON:  None.  MEDICATIONS: None.  COMPLICATIONS: None immediate  TECHNIQUE: Informed written consent was obtained from the patient after a discussion of the risks, benefits and alternatives to treatment. A timeout was performed prior to the initiation of the procedure.  Initial ultrasound scanning demonstrates a large amount of ascites within the right upper abdominal quadrant. The right upper abdomen was prepped and draped in the usual sterile fashion. 1% lidocaine was used for local anesthesia. Under direct ultrasound guidance, a 19 gauge, 7-cm, Yueh catheter was introduced. An ultrasound image was saved for documentation purposed. The paracentesis was performed. The catheter was removed and a dressing was applied. The patient tolerated the procedure well without immediate post procedural complication.  FINDINGS: A total of approximately 3.8 liters of serous fluid was removed. Samples were sent to the laboratory as requested by the clinical team.  IMPRESSION: Successful ultrasound-guided paracentesis yielding 3.8 liters of peritoneal fluid.  Read By:  Tsosie Billing PA-C   Electronically Signed   By: Aletta Edouard M.D.   On: 09/18/2014 10:34   Dg Abd Portable 1v  09/17/2014   CLINICAL DATA:  Mid abdominal pain  EXAM: PORTABLE ABDOMEN - 1 VIEW  COMPARISON:  CT 08/24/2014  FINDINGS: Common bile duct wall stent noted. No dilated loops of large or small bowel. There is gas and stool in the rectum. Extension of the abdomen with V bowel loops collecting centrally suggests ascites.  IMPRESSION: 1. No evidence of bowel obstruction. 2. Suspected ascites as seen on comparison CT.   Electronically Signed   By: Suzy Bouchard M.D.   On: 09/17/2014 09:11   US Abdomen Limited Ruq  09/17/2014    CLINICAL DATA:  Biliary obstruction, right upper quadrant pain for several weeks. Biliary stent placed December 2014. History pancreatic cancer  EXAM: US ABDOMEN LIMITED - RIGHT UPPER QUADRANT  COMPARISON:  Ultrasound 01/04/2014, CT 08/24/2014  FINDINGS: Gallbladder:  There is dependent sludge versus small stones within the lumen of the gallbladder. Gallbladder wall is mildly thickened to 5 mm. No pericholecystic fluid. There is a moderate volume  of intraperitoneal free fluid / ascites which likely contributes to the gallbladder wall thickening. Negative sonographic Murphy's sign.  Common bile duct:  Diameter: The biliary stent in place. Common bile duct measures 7 mm.  Liver:  There is mild intrahepatic biliary duct station on the left or right. No focal hepatic lesions identified. Moderate volume ascites appears increased from CT of 08/24/2014 although different techniques.  IMPRESSION: 1. Moderate volume of ascites a appears increased from CT of 08/24/2014. 2. Gallbladder wall thickening and sludge in the gallbladder. Gallbladder wall thickening is likely secondary to the ascites. No gallbladder distension. Negative sonographic Murphy's sign. 3. Mild intrahepatic biliary duct dilatation is similar to comparison CT.   Electronically Signed   By: Suzy Bouchard M.D.   On: 09/17/2014 10:05    Microbiology: Recent Results (from the past 240 hour(s))  Culture, blood (routine x 2)     Status: None   Collection Time: 09/13/14  6:58 PM  Result Value Ref Range Status   Specimen Description BLOOD RIGHT ANTECUBITAL  Final   Special Requests BOTTLES DRAWN AEROBIC AND ANAEROBIC 5ML  Final   Culture  Setup Time   Final    09/13/2014 22:33 Performed at [facility]   Culture   Final    KLEBSIELLA PNEUMONIAE Note: Gram Stain Report Called to,Read Back By and Verified With: REGINA BALDWIN RN [phone] Performed at [facility]   Report Status 09/16/2014 FINAL  Final   Organism ID, Bacteria  KLEBSIELLA PNEUMONIAE  Final      Susceptibility   Klebsiella pneumoniae - MIC*    AMPICILLIN RESISTANT      AMPICILLIN/SULBACTAM 4 SENSITIVE Sensitive     CEFAZOLIN <=4 SENSITIVE Sensitive     CEFEPIME <=1 SENSITIVE Sensitive     CEFTAZIDIME <=1 SENSITIVE Sensitive     CEFTRIAXONE <=1 SENSITIVE Sensitive     CIPROFLOXACIN <=0.25 SENSITIVE Sensitive     GENTAMICIN <=1 SENSITIVE Sensitive     IMIPENEM <=0.25 SENSITIVE Sensitive     PIP/TAZO <=4 SENSITIVE Sensitive     TOBRAMYCIN <=1 SENSITIVE Sensitive     TRIMETH/SULFA <=20 SENSITIVE Sensitive     * KLEBSIELLA PNEUMONIAE  Culture, blood (routine x 2)     Status: None   Collection Time: 09/13/14  6:59 PM  Result Value Ref Range Status   Specimen Description BLOOD RIGHT PORT  Final   Special Requests BOTTLES DRAWN AEROBIC AND ANAEROBIC 5ML  Final   Culture  Setup Time   Final    09/13/2014 22:34 Performed at [facility]   Culture   Final    KLEBSIELLA PNEUMONIAE Note: SUSCEPTIBILITIES PERFORMED ON PREVIOUS CULTURE WITHIN THE LAST 5 DAYS. Note: Gram Stain Report Called to,Read Back By and Verified With: [place] [phone] Performed at [facility]   Report Status 09/16/2014 FINAL  Final  Urine culture     Status: None   Collection Time: 09/13/14  8:46 PM  Result Value Ref Range Status   Specimen Description URINE, CLEAN CATCH  Final   Special Requests NONE  Final   Culture  Setup Time   Final    09/14/2014 01:45 Performed at [place] Performed at [facility]  Final   Culture NO GROWTH Performed at [facility]  Final   Report Status 09/15/2014 FINAL  Final  Clostridium Difficile by PCR     Status: None   Collection Time: 09/15/14 12:27 PM  Result Value  Ref Range Status   C difficile by pcr NEGATIVE NEGATIVE Final    Comment: Performed at [facility]  Body fluid culture     Status: None (Preliminary result)   Collection Time: 09/18/14 11:03  AM  Result Value Ref Range Status   Specimen Description ASCITIC  Final   Special Requests NONE  Final   Gram Stain   Final    NO WBC SEEN NO ORGANISMS SEEN Performed at [facility]    Culture NO GROWTH Performed at [facility]   Final   Report Status PENDING  Incomplete     Labs: Basic Metabolic Panel:  Recent Labs Lab 09/15/14 0525 09/16/14 0510 09/17/14 0550 09/18/14 0543 09/19/14 0515  NA 132* 138 132* 133* 133*  K 3.8 3.8 4.2 4.1 3.8  CL 99 104 97 100 98  CO2 25 25 25 24 27   GLUCOSE 190* 62* 92 84 200*  BUN 14 15 14 12 8   CREATININE 0.62 0.63 0.61 0.58 0.46*  CALCIUM 8.0* 8.3* 8.4 8.7 8.2*   Liver Function Tests:  Recent Labs Lab 09/13/14 1857 09/18/14 0543 09/19/14 0515  AST 66* 52* 49*  ALT 20 19 19   ALKPHOS 664* 689* 568*  BILITOT 2.1* 3.1* 1.7*  PROT 7.0 6.7 6.1  ALBUMIN 2.4* 2.1* 1.9*    Recent Labs Lab 09/13/14 1857  LIPASE 4*   No results for input(s): AMMONIA in the last 168 hours. CBC:  Recent Labs Lab 09/13/14 1857 09/14/14 0502 09/15/14 0525 09/16/14 0510 09/17/14 0550 09/17/14 1815 09/18/14 0543 09/19/14 0515  WBC 19.3* 11.2* 8.9 8.9 8.9  --  9.9 8.1  NEUTROABS 17.7* 10.0*  --   --   --   --   --   --   HGB 8.1* 7.1* 7.3* 7.3* 6.8* 10.1* 10.0* 9.5*  HCT 25.3* 22.2* 23.8* 22.6* 21.3* 30.8* 30.9* 29.3*  MCV 74.9* 74.7* 76.5* 74.6* 75.3*  --  79.4 80.1  PLT 358 289 281 293 316  --  275 265   Cardiac Enzymes: No results for input(s): CKTOTAL, CKMB, CKMBINDEX, TROPONINI in the last 168 hours. BNP: BNP (last 3 results) No results for input(s): PROBNP in the last 8760 hours. CBG:  Recent Labs Lab 09/19/14 0753 09/19/14 1142 09/19/14 1720 09/19/14 2159 09/20/14 0800  GLUCAP 164* 153* 109* 149* 160*    Signed:  Abb Gobert K  [facility] 09/20/2014, 11:34 AM

## 2014-09-22 LAB — BODY FLUID CULTURE
Culture: NO GROWTH
Gram Stain: NONE SEEN

## 2014-10-18 DEATH — deceased

## 2016-05-03 ENCOUNTER — Other Ambulatory Visit: Payer: Self-pay | Admitting: Nurse Practitioner
